# Patient Record
Sex: Female | Born: 1960
Health system: Southern US, Community
[De-identification: ages and names within clinical notes are randomized; demographics above are authoritative.]

## PROBLEM LIST (undated history)

## (undated) ENCOUNTER — Emergency Department (HOSPITAL_COMMUNITY): Payer: BC Managed Care – PPO

## (undated) DIAGNOSIS — E039 Hypothyroidism, unspecified: Secondary | ICD-10-CM

## (undated) DIAGNOSIS — C801 Malignant (primary) neoplasm, unspecified: Secondary | ICD-10-CM

## (undated) DIAGNOSIS — E785 Hyperlipidemia, unspecified: Secondary | ICD-10-CM

## (undated) DIAGNOSIS — I10 Essential (primary) hypertension: Secondary | ICD-10-CM

## (undated) HISTORY — DX: Morbid (severe) obesity due to excess calories: E66.01

## (undated) HISTORY — DX: Hypothyroidism, unspecified: E03.9

## (undated) HISTORY — DX: Essential (primary) hypertension: I10

## (undated) HISTORY — DX: Hyperlipidemia, unspecified: E78.5

## (undated) HISTORY — PX: THYROIDECTOMY: SHX17

---
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
## 1998-12-05 ENCOUNTER — Ambulatory Visit (HOSPITAL_COMMUNITY): Admission: RE | Admit: 1998-12-05 | Discharge: 1998-12-05 | Payer: Self-pay | Admitting: Obstetrics

## 1998-12-05 ENCOUNTER — Other Ambulatory Visit: Admission: RE | Admit: 1998-12-05 | Discharge: 1998-12-05 | Payer: Self-pay | Admitting: Obstetrics

## 1999-02-13 ENCOUNTER — Ambulatory Visit (HOSPITAL_COMMUNITY): Admission: RE | Admit: 1999-02-13 | Discharge: 1999-02-13 | Payer: Self-pay | Admitting: Obstetrics

## 1999-02-13 ENCOUNTER — Encounter: Payer: Self-pay | Admitting: Obstetrics

## 1999-05-04 ENCOUNTER — Inpatient Hospital Stay (HOSPITAL_COMMUNITY): Admission: AD | Admit: 1999-05-04 | Discharge: 1999-05-04 | Payer: Self-pay | Admitting: Obstetrics

## 1999-06-27 ENCOUNTER — Inpatient Hospital Stay (HOSPITAL_COMMUNITY): Admission: AD | Admit: 1999-06-27 | Discharge: 1999-06-27 | Payer: Self-pay | Admitting: Obstetrics

## 1999-06-29 ENCOUNTER — Encounter (HOSPITAL_COMMUNITY): Admission: RE | Admit: 1999-06-29 | Discharge: 1999-07-11 | Payer: Self-pay | Admitting: Obstetrics

## 1999-07-02 ENCOUNTER — Inpatient Hospital Stay (HOSPITAL_COMMUNITY): Admission: AD | Admit: 1999-07-02 | Discharge: 1999-07-02 | Payer: Self-pay | Admitting: Obstetrics

## 1999-07-10 ENCOUNTER — Inpatient Hospital Stay (HOSPITAL_COMMUNITY): Admission: AD | Admit: 1999-07-10 | Discharge: 1999-07-13 | Payer: Self-pay | Admitting: Obstetrics

## 2000-05-14 ENCOUNTER — Other Ambulatory Visit: Admission: RE | Admit: 2000-05-14 | Discharge: 2000-05-14 | Payer: Self-pay | Admitting: Obstetrics

## 2000-07-31 ENCOUNTER — Ambulatory Visit (HOSPITAL_COMMUNITY): Admission: RE | Admit: 2000-07-31 | Discharge: 2000-07-31 | Payer: Self-pay | Admitting: Obstetrics

## 2000-07-31 ENCOUNTER — Encounter: Payer: Self-pay | Admitting: Obstetrics

## 2000-08-07 ENCOUNTER — Encounter: Payer: Self-pay | Admitting: Obstetrics

## 2000-08-07 ENCOUNTER — Ambulatory Visit (HOSPITAL_COMMUNITY): Admission: RE | Admit: 2000-08-07 | Discharge: 2000-08-07 | Payer: Self-pay | Admitting: Obstetrics

## 2000-10-28 ENCOUNTER — Emergency Department (HOSPITAL_COMMUNITY): Admission: EM | Admit: 2000-10-28 | Discharge: 2000-10-28 | Payer: Self-pay | Admitting: *Deleted

## 2000-11-17 ENCOUNTER — Inpatient Hospital Stay (HOSPITAL_COMMUNITY): Admission: AD | Admit: 2000-11-17 | Discharge: 2000-11-17 | Payer: Self-pay

## 2000-12-31 ENCOUNTER — Inpatient Hospital Stay (HOSPITAL_COMMUNITY): Admission: AD | Admit: 2000-12-31 | Discharge: 2000-12-31 | Payer: Self-pay | Admitting: Obstetrics

## 2001-01-13 ENCOUNTER — Encounter (INDEPENDENT_AMBULATORY_CARE_PROVIDER_SITE_OTHER): Payer: Self-pay

## 2001-01-13 ENCOUNTER — Inpatient Hospital Stay (HOSPITAL_COMMUNITY): Admission: AD | Admit: 2001-01-13 | Discharge: 2001-01-15 | Payer: Self-pay | Admitting: Obstetrics

## 2003-01-04 ENCOUNTER — Encounter: Payer: Self-pay | Admitting: *Deleted

## 2003-01-04 ENCOUNTER — Emergency Department (HOSPITAL_COMMUNITY): Admission: EM | Admit: 2003-01-04 | Discharge: 2003-01-04 | Payer: Self-pay | Admitting: Emergency Medicine

## 2005-07-12 ENCOUNTER — Ambulatory Visit (HOSPITAL_COMMUNITY): Admission: RE | Admit: 2005-07-12 | Discharge: 2005-07-12 | Payer: Self-pay | Admitting: Obstetrics

## 2008-02-24 ENCOUNTER — Ambulatory Visit: Payer: Self-pay | Admitting: Internal Medicine

## 2008-02-24 LAB — CONVERTED CEMR LAB
LDL Cholesterol: 150 mg/dL — ABNORMAL HIGH (ref 0–99)
Potassium: 4.2 meq/L (ref 3.5–5.3)
Sodium: 140 meq/L (ref 135–145)
Total CHOL/HDL Ratio: 3.9

## 2008-03-03 ENCOUNTER — Ambulatory Visit (HOSPITAL_COMMUNITY): Admission: RE | Admit: 2008-03-03 | Discharge: 2008-03-03 | Payer: Self-pay | Admitting: Family Medicine

## 2008-03-23 ENCOUNTER — Ambulatory Visit: Payer: Self-pay | Admitting: Internal Medicine

## 2008-03-23 LAB — CONVERTED CEMR LAB
CO2: 21 meq/L (ref 19–32)
Chloride: 105 meq/L (ref 96–112)
Creatinine, Ser: 0.75 mg/dL (ref 0.40–1.20)
Glucose, Bld: 101 mg/dL — ABNORMAL HIGH (ref 70–99)
HDL: 58 mg/dL (ref >39)
LDL Cholesterol: 149 mg/dL — ABNORMAL HIGH (ref 0–99)
Potassium: 4.6 meq/L (ref 3.5–5.3)

## 2008-03-30 ENCOUNTER — Ambulatory Visit (HOSPITAL_COMMUNITY): Admission: RE | Admit: 2008-03-30 | Discharge: 2008-03-30 | Payer: Self-pay | Admitting: *Deleted

## 2009-11-08 ENCOUNTER — Ambulatory Visit: Payer: Self-pay | Admitting: Internal Medicine

## 2009-11-08 LAB — CONVERTED CEMR LAB
BUN: 13 mg/dL (ref 6–23)
CO2: 22 meq/L (ref 19–32)
Cholesterol: 261 mg/dL — ABNORMAL HIGH (ref 0–200)
Glucose, Bld: 98 mg/dL (ref 70–99)
HDL: 62 mg/dL (ref >39)
Potassium: 4.4 meq/L (ref 3.5–5.3)
Sodium: 140 meq/L (ref 135–145)
Total CHOL/HDL Ratio: 4.2
VLDL: 21 mg/dL (ref 0–40)

## 2009-12-11 ENCOUNTER — Ambulatory Visit: Payer: Self-pay | Admitting: Internal Medicine

## 2009-12-11 LAB — CONVERTED CEMR LAB
BUN: 15 mg/dL (ref 6–23)
Calcium: 9.4 mg/dL (ref 8.4–10.5)
Creatinine, Ser: 0.86 mg/dL (ref 0.40–1.20)
Glucose, Bld: 93 mg/dL (ref 70–99)
Hgb A1c MFr Bld: 6.1 % (ref 4.6–6.1)
Potassium: 4.3 meq/L (ref 3.5–5.3)
Sodium: 138 meq/L (ref 135–145)

## 2009-12-18 ENCOUNTER — Ambulatory Visit: Payer: Self-pay | Admitting: Internal Medicine

## 2009-12-18 LAB — CONVERTED CEMR LAB: TSH: 1.406 microintl units/mL (ref 0.350–4.500)

## 2009-12-27 ENCOUNTER — Ambulatory Visit (HOSPITAL_COMMUNITY): Admission: RE | Admit: 2009-12-27 | Discharge: 2009-12-27 | Payer: Self-pay | Admitting: Internal Medicine

## 2010-01-18 ENCOUNTER — Encounter (HOSPITAL_COMMUNITY): Admission: RE | Admit: 2010-01-18 | Discharge: 2010-04-04 | Payer: Self-pay | Admitting: Internal Medicine

## 2010-02-07 ENCOUNTER — Ambulatory Visit: Payer: Self-pay | Admitting: Internal Medicine

## 2010-03-13 ENCOUNTER — Ambulatory Visit (HOSPITAL_COMMUNITY): Admission: RE | Admit: 2010-03-13 | Discharge: 2010-03-13 | Payer: Self-pay | Admitting: General Surgery

## 2010-05-04 ENCOUNTER — Encounter (INDEPENDENT_AMBULATORY_CARE_PROVIDER_SITE_OTHER): Payer: Self-pay | Admitting: General Surgery

## 2010-05-04 ENCOUNTER — Ambulatory Visit (HOSPITAL_COMMUNITY): Admission: RE | Admit: 2010-05-04 | Discharge: 2010-05-05 | Payer: Self-pay | Admitting: General Surgery

## 2010-05-09 ENCOUNTER — Ambulatory Visit: Payer: Self-pay | Admitting: Internal Medicine

## 2010-05-09 ENCOUNTER — Ambulatory Visit (HOSPITAL_COMMUNITY): Admission: RE | Admit: 2010-05-09 | Discharge: 2010-05-09 | Payer: Self-pay | Admitting: Internal Medicine

## 2010-05-09 ENCOUNTER — Encounter (INDEPENDENT_AMBULATORY_CARE_PROVIDER_SITE_OTHER): Payer: Self-pay | Admitting: Internal Medicine

## 2010-05-09 ENCOUNTER — Ambulatory Visit: Payer: Self-pay | Admitting: Vascular Surgery

## 2010-05-09 LAB — CONVERTED CEMR LAB
Basophils Absolute: 0 10*3/uL (ref 0.0–0.1)
Basophils Relative: 1 % (ref 0–1)
Eosinophils Relative: 3 % (ref 0–5)
HCT: 38.2 % (ref 36.0–46.0)
MCV: 78.8 fL (ref 78.0–100.0)
Monocytes Relative: 10 % (ref 3–12)
Neutro Abs: 2.5 10*3/uL (ref 1.7–7.7)
Neutrophils Relative %: 49 % (ref 43–77)
Prothrombin Time: 15.2 s (ref 11.6–15.2)
RBC: 4.85 M/uL (ref 3.87–5.11)
RDW: 14.7 % (ref 11.5–15.5)
WBC: 5.1 10*3/uL (ref 4.0–10.5)

## 2010-06-04 ENCOUNTER — Ambulatory Visit: Payer: Self-pay | Admitting: Internal Medicine

## 2010-06-28 ENCOUNTER — Ambulatory Visit: Payer: Self-pay | Admitting: Internal Medicine

## 2010-06-28 LAB — CONVERTED CEMR LAB
BUN: 14 mg/dL (ref 6–23)
Glucose, Bld: 106 mg/dL — ABNORMAL HIGH (ref 70–99)
Sodium: 140 meq/L (ref 135–145)

## 2010-08-01 ENCOUNTER — Encounter (INDEPENDENT_AMBULATORY_CARE_PROVIDER_SITE_OTHER): Payer: Self-pay | Admitting: Internal Medicine

## 2010-08-01 LAB — CONVERTED CEMR LAB
ALT: 21 units/L (ref 0–35)
AST: 16 units/L (ref 0–37)
Alkaline Phosphatase: 102 units/L (ref 39–117)
Basophils Relative: 1 % (ref 0–1)
Calcium: 9.5 mg/dL (ref 8.4–10.5)
Chloride: 104 meq/L (ref 96–112)
Eosinophils Relative: 2 % (ref 0–5)
Glucose, Bld: 124 mg/dL — ABNORMAL HIGH (ref 70–99)
Hemoglobin: 11.3 g/dL — ABNORMAL LOW (ref 12.0–15.0)
Lymphocytes Relative: 43 % (ref 12–46)
MCV: 82.6 fL (ref 78.0–100.0)
Monocytes Relative: 6 % (ref 3–12)
Neutro Abs: 2.1 10*3/uL (ref 1.7–7.7)
Platelets: 223 10*3/uL (ref 150–400)
Potassium: 4.2 meq/L (ref 3.5–5.3)
RDW: 15.8 % — ABNORMAL HIGH (ref 11.5–15.5)
TSH: 4.892 microintl units/mL — ABNORMAL HIGH (ref 0.350–4.500)
Total Bilirubin: 0.3 mg/dL (ref 0.3–1.2)
WBC: 4.4 10*3/uL (ref 4.0–10.5)

## 2010-08-06 ENCOUNTER — Ambulatory Visit (HOSPITAL_COMMUNITY): Admission: RE | Admit: 2010-08-06 | Discharge: 2010-08-06 | Payer: Self-pay | Admitting: Internal Medicine

## 2010-08-15 ENCOUNTER — Encounter (INDEPENDENT_AMBULATORY_CARE_PROVIDER_SITE_OTHER): Payer: Self-pay | Admitting: *Deleted

## 2010-08-15 LAB — CONVERTED CEMR LAB
Cholesterol: 251 mg/dL — ABNORMAL HIGH (ref 0–200)
LDL Cholesterol: 155 mg/dL — ABNORMAL HIGH (ref 0–99)
TSH: 7.362 microintl units/mL — ABNORMAL HIGH (ref 0.350–4.500)
Total CHOL/HDL Ratio: 4
Triglycerides: 170 mg/dL — ABNORMAL HIGH (ref <150)
VLDL: 34 mg/dL (ref 0–40)

## 2010-09-20 ENCOUNTER — Other Ambulatory Visit
Admission: RE | Admit: 2010-09-20 | Discharge: 2010-09-20 | Payer: Self-pay | Source: Home / Self Care | Admitting: Obstetrics & Gynecology

## 2010-09-20 ENCOUNTER — Ambulatory Visit: Payer: Self-pay | Admitting: Obstetrics & Gynecology

## 2010-10-10 ENCOUNTER — Ambulatory Visit: Payer: Self-pay | Admitting: Obstetrics and Gynecology

## 2010-10-15 ENCOUNTER — Encounter (INDEPENDENT_AMBULATORY_CARE_PROVIDER_SITE_OTHER): Payer: Self-pay | Admitting: Internal Medicine

## 2010-10-15 LAB — CONVERTED CEMR LAB
HDL: 56 mg/dL (ref >39)
Total CHOL/HDL Ratio: 4.2
Triglycerides: 106 mg/dL (ref <150)
VLDL: 21 mg/dL (ref 0–40)

## 2011-01-20 LAB — COMPREHENSIVE METABOLIC PANEL
ALT: 27 U/L (ref 0–35)
CO2: 27 mEq/L (ref 19–32)
Calcium: 9.3 mg/dL (ref 8.4–10.5)
Chloride: 104 mEq/L (ref 96–112)
Creatinine, Ser: 0.96 mg/dL (ref 0.4–1.2)
GFR calc Af Amer: >60 mL/min (ref >60)
Potassium: 4 mEq/L (ref 3.5–5.1)
Total Protein: 7.5 g/dL (ref 6.0–8.3)

## 2011-01-20 LAB — URINALYSIS, ROUTINE W REFLEX MICROSCOPIC
Bilirubin Urine: NEGATIVE
Hgb urine dipstick: NEGATIVE
Ketones, ur: NEGATIVE mg/dL
Nitrite: NEGATIVE
Protein, ur: NEGATIVE mg/dL
Specific Gravity, Urine: 1.024 (ref 1.005–1.030)
Urobilinogen, UA: 0.2 mg/dL (ref 0.0–1.0)
pH: 6.5 (ref 5.0–8.0)

## 2011-01-20 LAB — SURGICAL PCR SCREEN: Staphylococcus aureus: NEGATIVE

## 2011-01-20 LAB — DIFFERENTIAL
Basophils Absolute: 0 10*3/uL (ref 0.0–0.1)
Lymphocytes Relative: 38 % (ref 12–46)
Lymphs Abs: 1.6 10*3/uL (ref 0.7–4.0)
Monocytes Relative: 12 % (ref 3–12)
Neutro Abs: 2.1 10*3/uL (ref 1.7–7.7)

## 2011-01-20 LAB — CBC
HCT: 35.6 % — ABNORMAL LOW (ref 36.0–46.0)
Hemoglobin: 11.9 g/dL — ABNORMAL LOW (ref 12.0–15.0)
MCH: 26.3 pg (ref 26.0–34.0)
MCHC: 33.3 g/dL (ref 30.0–36.0)
RBC: 4.51 MIL/uL (ref 3.87–5.11)

## 2011-01-20 LAB — CALCIUM: Calcium: 8.6 mg/dL (ref 8.4–10.5)

## 2011-01-20 LAB — T3 UPTAKE: T3 Uptake Ratio: 35.4 % (ref 22.5–37.0)

## 2011-03-22 NOTE — Op Note (Signed)
[facility]  Patient:    Michelle [name], Michelle [name]                           MRN: [id number] Proc. Date: 01/14/01 Attending:  Kathreen Cosier, M.D.                           Operative Report  PREOPERATIVE DIAGNOSIS:       Multiparity.  PROCEDURE:                    Postpartum tubal ligation.  SURGEON:                      Kathreen Cosier, M.D.  DESCRIPTION OF PROCEDURE:     Using epidural, the patient was placed in the supine position, the abdomen was prepped and draped and the bladder emptied with a straight catheter.  A midline subumbilical incision 1 inch long was made and carried down to the rectus fascia.  The fascia was cleaned, and grasped with two Kochers.  The fascia and the peritoneum were opened with Mayo scissors.  The left tube was grasped in the mid portion with a Babcock clamp and 0 plain suture was placed on the mesosalpinx below the portion of the tube within the clamp.  This was tied and approximately 1 inch of tube transected. The procedure was done in a similar fashion on the other side.  Hemostasis was satisfactory.  The abdomen was closed in layers, the peritoneum with continuous ______ and the fascia with continuous 2-0 Dexon.  The skin was closed with a subcuticular stitch of 3-0 plain. DD:  01/14/01 TD:  01/14/01 Job: [id number]

## 2011-03-22 NOTE — Discharge Summary (Signed)
[facility]  Patient:    Michelle [name], Michelle [name]                         MRN: [id number] Adm. Date:  [id number] Attending:  Venita Sheffield                           Discharge Summary  HISTORY OF PRESENT ILLNESS:   The patient is a 50 year old gravida 3, para 2-0-0-2, with EDC of January 10, 2001.  HOSPITAL COURSE:              She was admitted in labor and had a normal spontaneous vaginal delivery of a female, Apgars 9 and 9.  The patient desired sterilization and on March 13, underwent postpartum tubal ligation. Postoperatively she did well.  She was discharged home on March 14, second postpartum day, ambulatory on a regular diet, on Tylenol No.3 one q.4h. p.r.n. to see me in six weeks.  Hemoglobin was 11.1.  DISCHARGE DIAGNOSIS:          Status post normal spontaneous vaginal delivery at term and postpartum tubal ligation. DD:  01/15/01 TD:  01/15/01 Job: [id number]

## 2011-11-14 IMAGING — CR DG CHEST 2V
2 series · 2 of 2 positions shown · non-contrast
Comparison: None.

CLINICAL DATA: Preoperative evaluation for thyroid goiter. Past
smoker. No current complaints.

CHEST - 2 VIEW

[w chest pa]
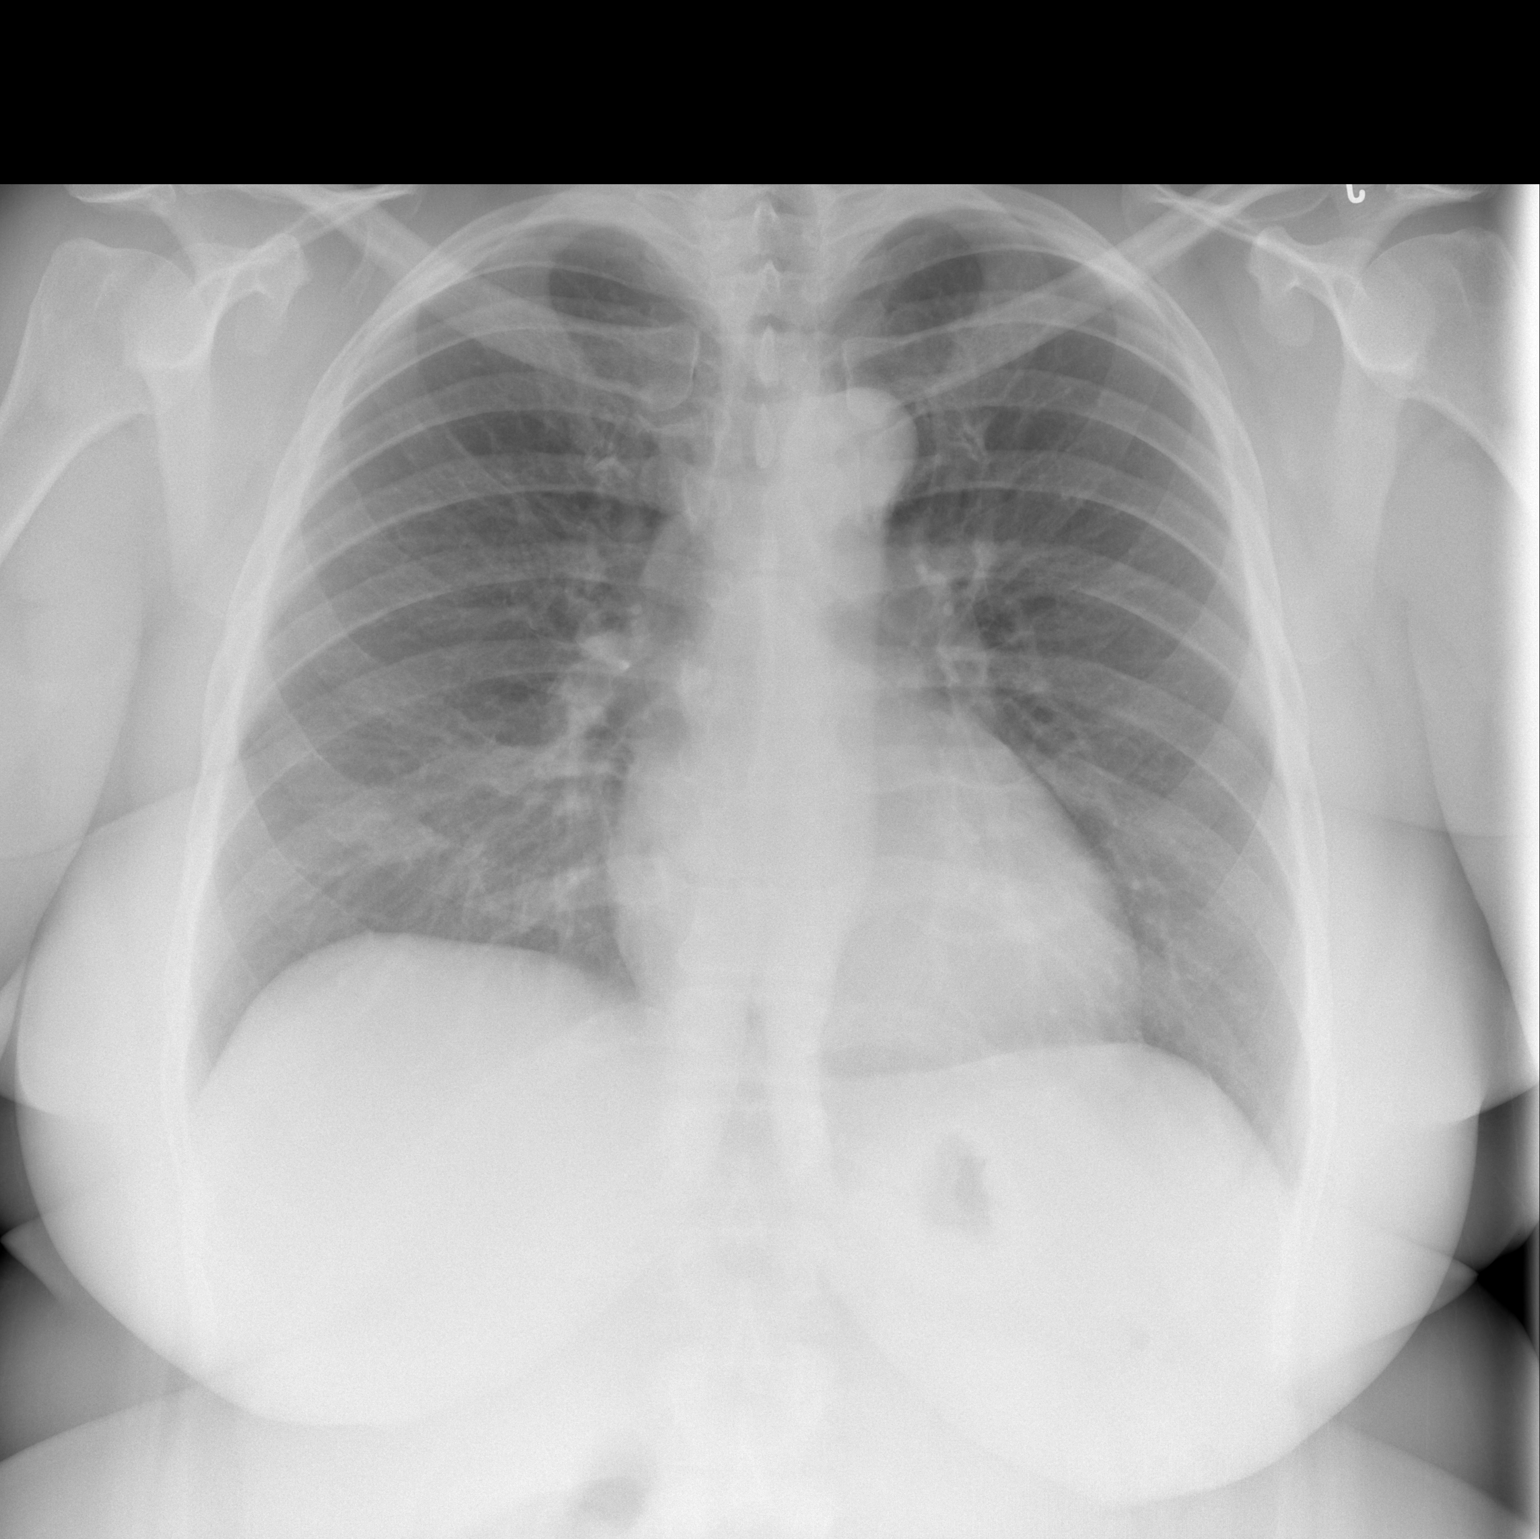

[w chest lat]
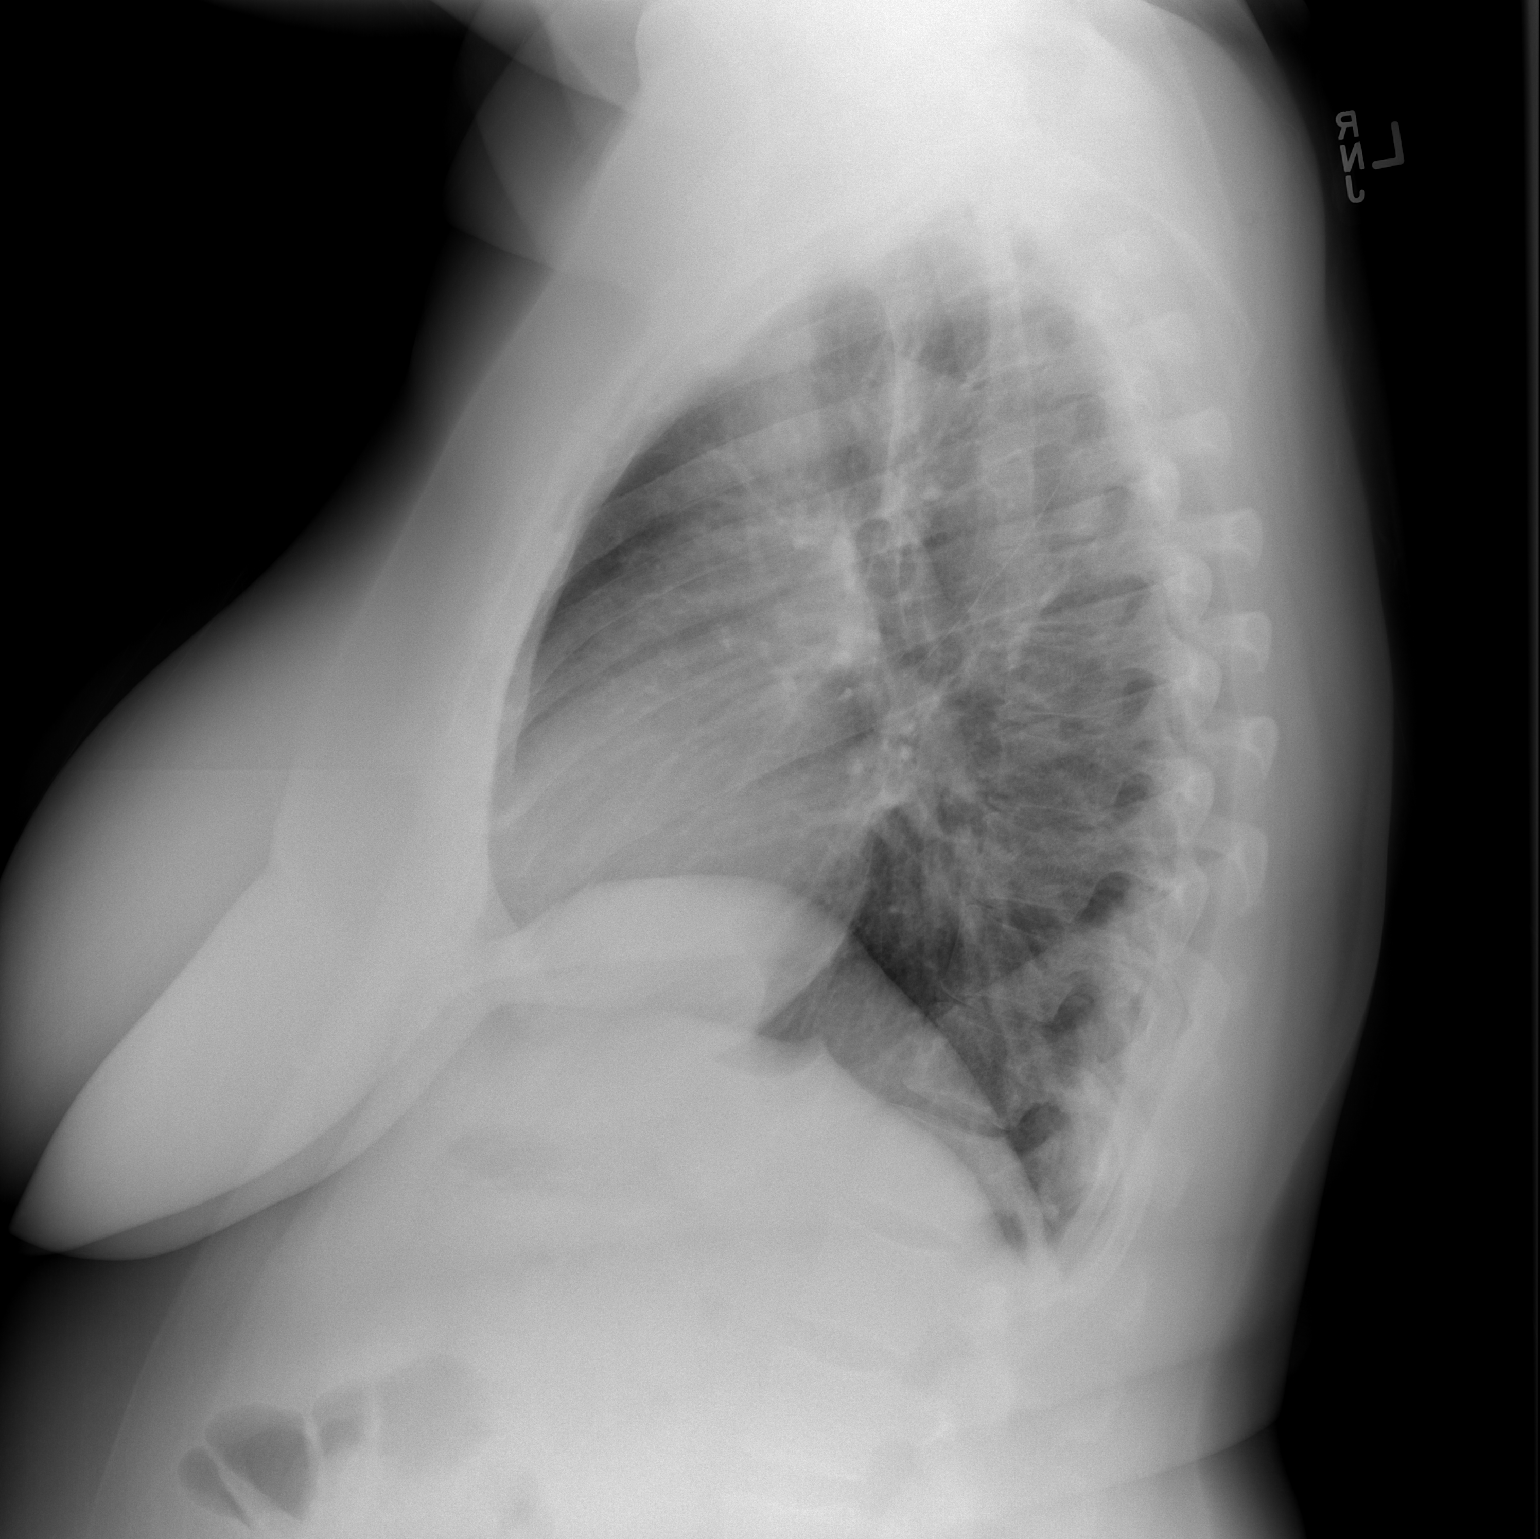

[2 of 2 positions shown; findings below may reference images not displayed]

FINDINGS: Heart and mediastinal contours are within normal limits.
The lung fields are clear with no signs of focal infiltrate or
congestion failure.  No pleural fluid or peribronchial cuffing is
seen.

Bony structures are intact.
IMPRESSION: No acute or focal abnormality noted.

## 2012-02-19 ENCOUNTER — Other Ambulatory Visit: Payer: Self-pay | Admitting: Obstetrics and Gynecology

## 2012-02-19 DIAGNOSIS — Z1231 Encounter for screening mammogram for malignant neoplasm of breast: Secondary | ICD-10-CM

## 2012-02-25 ENCOUNTER — Ambulatory Visit (HOSPITAL_COMMUNITY): Payer: Self-pay

## 2012-02-25 ENCOUNTER — Ambulatory Visit: Payer: Self-pay

## 2012-02-25 ENCOUNTER — Encounter (HOSPITAL_COMMUNITY): Payer: Self-pay

## 2012-03-31 ENCOUNTER — Encounter: Payer: Self-pay | Admitting: *Deleted

## 2012-03-31 ENCOUNTER — Ambulatory Visit (INDEPENDENT_AMBULATORY_CARE_PROVIDER_SITE_OTHER): Payer: Self-pay | Admitting: *Deleted

## 2012-03-31 ENCOUNTER — Ambulatory Visit (HOSPITAL_COMMUNITY)
Admission: RE | Admit: 2012-03-31 | Discharge: 2012-03-31 | Disposition: A | Discharge status: Home / Self Care | Payer: Self-pay | Source: Ambulatory Visit | Attending: Obstetrics and Gynecology | Admitting: Obstetrics and Gynecology

## 2012-03-31 VITALS — BP 136/99 | HR 89 | Temp 99.2°F | Ht 66.0 in | Wt 205.6 lb

## 2012-03-31 DIAGNOSIS — Z01419 Encounter for gynecological examination (general) (routine) without abnormal findings: Secondary | ICD-10-CM

## 2012-03-31 DIAGNOSIS — Z1231 Encounter for screening mammogram for malignant neoplasm of breast: Secondary | ICD-10-CM

## 2012-03-31 NOTE — Progress Notes (Signed)
Patient complained that she needed a repeat Pap smear due to insufficient cells. Last Pap smear performed 12/30/11 at the free cervical cancer screening at the [facility].  Pap Smear:    Pap smear completed today. Patients last Pap smear was 12/30/11 at the free cervical cancer screening at the [facility] and [facility] with HPV typing insufficient due to not enough residual specimen to perform test. Did not receive patients Pap smear result until after patient left. Per patient has no history of abnormal Pap smears prior. No Pap smear results in EPIC.  Physical exam: Breasts Breasts symmetrical. No skin abnormalities bilateral breasts. No nipple retraction bilateral breasts. No nipple discharge bilateral breasts. No lymphadenopathy. No lumps palpated bilateral breasts. No complaints of pain or tenderness on exam.          Pelvic/Bimanual   Ext Genitalia No lesions, no swelling and no discharge observed on external genitalia.         Vagina Vagina pink and normal texture. No lesions or discharge observed in vagina.          Cervix Cervix is present. Cervix red and of normal texture. Cervix friable. No discharge observed at cervical os.     Uterus Uterus is present and palpable. Uterus in normal position and normal size.        Adnexae Bilateral ovaries present and palpable. No tenderness on palpation.          Rectovaginal No rectal exam completed today since patient had no rectal complaints. No skin abnormalities observed on exam.

## 2012-03-31 NOTE — Patient Instructions (Signed)
Taught patient how to perform BSE.  Let her know [facility] will cover Pap smears every 3 years unless has a history of abnormal Pap smears and depending on today's result would determine when next Pap smear is due. Patient is escorted to mammography for a screening mammogram.  Let patient know will follow up with her within the next couple weeks with results. Patient verbalized understanding.

## 2012-04-14 ENCOUNTER — Encounter: Payer: Self-pay | Admitting: *Deleted

## 2012-04-14 ENCOUNTER — Telehealth: Payer: Self-pay | Admitting: *Deleted

## 2012-04-14 NOTE — Telephone Encounter (Signed)
Letter mailed to patient in reference to normal pap smear and normal mammogram.

## 2012-06-01 ENCOUNTER — Encounter: Payer: Self-pay | Admitting: *Deleted

## 2012-06-01 NOTE — Progress Notes (Signed)
[facility] Clinical Social Work  Clinical Social Work met with patient and patient's spouse to complete healthcare living will and healthcare power of attorney.  Michelle [name] designated [name] as her healthcare agent.  CSW will send documents to medical records to be scanned into her chart. To access documents, go to Chart Review > Media Tab > Advance Directives/Clinical Social Work.  [name], MSW, [facility] Clinical Social Worker [facility] [phone]

## 2012-12-10 ENCOUNTER — Other Ambulatory Visit: Payer: Self-pay | Admitting: Pediatrics

## 2012-12-10 ENCOUNTER — Ambulatory Visit: Payer: Self-pay

## 2012-12-10 DIAGNOSIS — M25569 Pain in unspecified knee: Secondary | ICD-10-CM

## 2013-03-22 ENCOUNTER — Other Ambulatory Visit (HOSPITAL_COMMUNITY): Payer: Self-pay | Admitting: Family Medicine

## 2013-03-22 DIAGNOSIS — Z1231 Encounter for screening mammogram for malignant neoplasm of breast: Secondary | ICD-10-CM

## 2013-04-01 ENCOUNTER — Ambulatory Visit (HOSPITAL_COMMUNITY)
Admission: RE | Admit: 2013-04-01 | Discharge: 2013-04-01 | Disposition: A | Discharge status: Home / Self Care | Payer: No Typology Code available for payment source | Source: Ambulatory Visit | Attending: Family Medicine | Admitting: Family Medicine

## 2013-04-01 DIAGNOSIS — Z1231 Encounter for screening mammogram for malignant neoplasm of breast: Secondary | ICD-10-CM | POA: Insufficient documentation

## 2014-01-12 ENCOUNTER — Other Ambulatory Visit (HOSPITAL_COMMUNITY): Payer: Self-pay | Admitting: Obstetrics

## 2014-01-12 DIAGNOSIS — Z780 Asymptomatic menopausal state: Secondary | ICD-10-CM

## 2014-01-17 ENCOUNTER — Ambulatory Visit (HOSPITAL_COMMUNITY)
Admission: RE | Admit: 2014-01-17 | Discharge: 2014-01-17 | Disposition: A | Discharge status: Home / Self Care | Payer: No Typology Code available for payment source | Source: Ambulatory Visit | Attending: Obstetrics | Admitting: Obstetrics

## 2014-01-17 DIAGNOSIS — Z13820 Encounter for screening for osteoporosis: Secondary | ICD-10-CM | POA: Insufficient documentation

## 2014-01-17 DIAGNOSIS — Z780 Asymptomatic menopausal state: Secondary | ICD-10-CM | POA: Insufficient documentation

## 2014-02-21 ENCOUNTER — Other Ambulatory Visit (HOSPITAL_COMMUNITY): Payer: Self-pay | Admitting: Obstetrics

## 2014-02-21 DIAGNOSIS — Z1231 Encounter for screening mammogram for malignant neoplasm of breast: Secondary | ICD-10-CM

## 2014-04-05 ENCOUNTER — Ambulatory Visit (HOSPITAL_COMMUNITY)
Admission: RE | Admit: 2014-04-05 | Discharge: 2014-04-05 | Disposition: A | Discharge status: Home / Self Care | Payer: No Typology Code available for payment source | Source: Ambulatory Visit | Attending: Obstetrics | Admitting: Obstetrics

## 2014-04-05 DIAGNOSIS — Z1231 Encounter for screening mammogram for malignant neoplasm of breast: Secondary | ICD-10-CM | POA: Insufficient documentation

## 2014-09-05 ENCOUNTER — Encounter: Payer: Self-pay | Admitting: *Deleted

## 2015-03-22 ENCOUNTER — Other Ambulatory Visit (HOSPITAL_COMMUNITY): Payer: Self-pay | Admitting: Obstetrics

## 2015-03-22 DIAGNOSIS — Z1231 Encounter for screening mammogram for malignant neoplasm of breast: Secondary | ICD-10-CM

## 2015-04-07 ENCOUNTER — Ambulatory Visit (HOSPITAL_COMMUNITY)
Admission: RE | Admit: 2015-04-07 | Discharge: 2015-04-07 | Disposition: A | Discharge status: Home / Self Care | Payer: 59 | Source: Ambulatory Visit | Attending: Obstetrics | Admitting: Obstetrics

## 2015-04-07 DIAGNOSIS — Z1231 Encounter for screening mammogram for malignant neoplasm of breast: Secondary | ICD-10-CM | POA: Insufficient documentation

## 2015-07-12 ENCOUNTER — Emergency Department (HOSPITAL_COMMUNITY): Payer: 59

## 2015-07-12 ENCOUNTER — Encounter (HOSPITAL_COMMUNITY): Payer: Self-pay | Admitting: *Deleted

## 2015-07-12 ENCOUNTER — Emergency Department (HOSPITAL_COMMUNITY)
Admission: EM | Admit: 2015-07-12 | Discharge: 2015-07-13 | Disposition: A | Discharge status: Home / Self Care | Payer: 59 | Attending: Emergency Medicine | Admitting: Emergency Medicine

## 2015-07-12 DIAGNOSIS — Z8639 Personal history of other endocrine, nutritional and metabolic disease: Secondary | ICD-10-CM | POA: Insufficient documentation

## 2015-07-12 DIAGNOSIS — Z79899 Other long term (current) drug therapy: Secondary | ICD-10-CM | POA: Insufficient documentation

## 2015-07-12 DIAGNOSIS — R079 Chest pain, unspecified: Secondary | ICD-10-CM | POA: Diagnosis not present

## 2015-07-12 DIAGNOSIS — Z880 Allergy status to penicillin: Secondary | ICD-10-CM | POA: Insufficient documentation

## 2015-07-12 DIAGNOSIS — I10 Essential (primary) hypertension: Secondary | ICD-10-CM | POA: Diagnosis not present

## 2015-07-12 LAB — CBC
HEMATOCRIT: 37.3 % (ref 36.0–46.0)
Hemoglobin: 12.2 g/dL (ref 12.0–15.0)
MCH: 26.3 pg (ref 26.0–34.0)
MCHC: 32.7 g/dL (ref 30.0–36.0)
MCV: 80.6 fL (ref 78.0–100.0)
PLATELETS: 260 10*3/uL (ref 150–400)
RBC: 4.63 MIL/uL (ref 3.87–5.11)
RDW: 14.2 % (ref 11.5–15.5)
WBC: 6.5 10*3/uL (ref 4.0–10.5)

## 2015-07-12 LAB — BASIC METABOLIC PANEL
Anion gap: 7 (ref 5–15)
BUN: 19 mg/dL (ref 6–20)
CALCIUM: 9.6 mg/dL (ref 8.9–10.3)
CO2: 26 mmol/L (ref 22–32)
CREATININE: 1.32 mg/dL — AB (ref 0.44–1.00)
Chloride: 106 mmol/L (ref 101–111)
GFR calc Af Amer: 51 mL/min — ABNORMAL LOW (ref >60)
GFR, EST NON AFRICAN AMERICAN: 44 mL/min — AB (ref >60)
GLUCOSE: 101 mg/dL — AB (ref 65–99)
Potassium: 4 mmol/L (ref 3.5–5.1)
SODIUM: 139 mmol/L (ref 135–145)

## 2015-07-12 LAB — I-STAT TROPONIN, ED: Troponin i, poc: 0 ng/mL (ref 0.00–0.08)

## 2015-07-12 MED ORDER — GI COCKTAIL ~~LOC~~
30.0000 mL | Freq: Once | ORAL | Status: AC
  Administered 2015-07-12: 30 mL via ORAL
  Filled 2015-07-12: qty 30

## 2015-07-12 MED ORDER — SODIUM CHLORIDE 0.9 % IV BOLUS (SEPSIS)
1000.0000 mL | Freq: Once | INTRAVENOUS | Status: AC
  Administered 2015-07-12: 1000 mL via INTRAVENOUS

## 2015-07-12 NOTE — ED Notes (Signed)
Pt reports pain in her L collar bone radiating to her L axilla intermittently x 1 week.  Pt reports feeling lightheaded at one point.   Reports nothing makes pain worse or better.  States a [employer] scientist at church advised her to come to the ED to be evaluated.

## 2015-07-13 LAB — I-STAT TROPONIN, ED: TROPONIN I, POC: 0 ng/mL (ref 0.00–0.08)

## 2015-07-13 MED ORDER — OMEPRAZOLE 20 MG PO CPDR: 20.0000 mg | DELAYED_RELEASE_CAPSULE | Freq: Every day | ORAL | Status: DC

## 2015-07-13 NOTE — Discharge Instructions (Signed)
Please follow up with your primary care physician in the next 1-2 weeks.  You will need repeat BMP, evaluation of your blood pressure, and follow up on your chest pain to see if prilosec/omeprazole was an effective treatment.   Chest Pain (Nonspecific) It is often hard to give a specific diagnosis for the cause of chest pain. There is always a chance that your pain could be related to something serious, such as a heart attack or a blood clot in the lungs. You need to follow up with your health care provider for further evaluation. CAUSES   Heartburn.  Pneumonia or bronchitis.  Anxiety or stress.  Inflammation around your heart (pericarditis) or lung (pleuritis or pleurisy).  A blood clot in the lung.  A collapsed lung (pneumothorax). It can develop suddenly on its own (spontaneous pneumothorax) or from trauma to the chest.  Shingles infection (herpes zoster virus). The chest wall is composed of bones, muscles, and cartilage. Any of these can be the source of the pain.  The bones can be bruised by injury.  The muscles or cartilage can be strained by coughing or overwork.  The cartilage can be affected by inflammation and become sore (costochondritis). DIAGNOSIS  Lab tests or other studies may be needed to find the cause of your pain. Your health care provider may have you take a test called an ambulatory electrocardiogram (ECG). An ECG records your heartbeat patterns over a 24-hour period. You may also have other tests, such as:  Transthoracic echocardiogram (TTE). During echocardiography, sound waves are used to evaluate how blood flows through your heart.  Transesophageal echocardiogram (TEE).  Cardiac monitoring. This allows your health care provider to monitor your heart rate and rhythm in real time.  Holter monitor. This is a portable device that records your heartbeat and can help diagnose heart arrhythmias. It allows your health care provider to track your heart activity for  several days, if needed.  Stress tests by exercise or by giving medicine that makes the heart beat faster. TREATMENT   Treatment depends on what may be causing your chest pain. Treatment may include:  Acid blockers for heartburn.  Anti-inflammatory medicine.  Pain medicine for inflammatory conditions.  Antibiotics if an infection is present.  You may be advised to change lifestyle habits. This includes stopping smoking and avoiding alcohol, caffeine, and chocolate.  You may be advised to keep your head raised (elevated) when sleeping. This reduces the chance of acid going backward from your stomach into your esophagus. Most of the time, nonspecific chest pain will improve within 2-3 days with rest and mild pain medicine.  HOME CARE INSTRUCTIONS   If antibiotics were prescribed, take them as directed. Finish them even if you start to feel better.  For the next few days, avoid physical activities that bring on chest pain. Continue physical activities as directed.  Do not use any tobacco products, including cigarettes, chewing tobacco, or electronic cigarettes.  Avoid drinking alcohol.  Only take medicine as directed by your health care provider.  Follow your health care provider's suggestions for further testing if your chest pain does not go away.  Keep any follow-up appointments you made. If you do not go to an appointment, you could develop lasting (chronic) problems with pain. If there is any problem keeping an appointment, call to reschedule. SEEK MEDICAL CARE IF:   Your chest pain does not go away, even after treatment.  You have a rash with blisters on your chest.  You have  a fever. SEEK IMMEDIATE MEDICAL CARE IF:   You have increased chest pain or pain that spreads to your arm, neck, jaw, back, or abdomen.  You have shortness of breath.  You have an increasing cough, or you cough up blood.  You have severe back or abdominal pain.  You feel nauseous or  vomit.  You have severe weakness.  You faint.  You have chills. This is an emergency. Do not wait to see if the pain will go away. Get medical help at once. Call your local emergency services (911 in [place] Do not drive yourself to the hospital. MAKE SURE YOU:   Understand these instructions.  Will watch your condition.  Will get help right away if you are not doing well or get worse. Document Released: 07/31/2005 Document Revised: 10/26/2013 Document Reviewed: 05/26/2008 [facility] Patient Information 2015 [place], [place]. This information is not intended to replace advice given to you by your health care provider. Make sure you discuss any questions you have with your health care provider.

## 2015-07-13 NOTE — ED Provider Notes (Signed)
CSN: [id number]     Arrival date & time 07/12/15  1851 History   First MD Initiated Contact with Patient 07/12/15 2215     Chief Complaint  Patient presents with  . Chest Pain     (Consider location/radiation/quality/duration/timing/severity/associated sxs/prior Treatment) HPI   Patient is a 54 year old female with past medical history of hypertension hyperlipidemia, she states she is currently not treated for either, she is otherwise healthy, she presents to the ER with one week of gradually increasing and intermittent central and left sided chest pain with some radiation to her left shoulder blade, rated 7/10, and described as an achy feeling.  She does not identify any aggravating or alleviating factors, it does not worsen with exertion, with eating, with positional changes, or with inspiration.  Over the past 3 days has become more intense, and a nurse at her church took her blood pressure and then insisted she come to the ER for evaluation.  She denies any shortness of breath, orthopnea, PND, OSA, palpitations, lower extremity edema, nausea, vomiting, cough, wheeze, diaphoresis, dyspepsia, abdominal pain, syncopy.  She endorses some intermittent lightheadedness, reflux, and she does not like to drink water. She does not have a family hx of MI or stroke.  She does not smoke or have DM.  Past Medical History  Diagnosis Date  . Hypertension   . Hyperlipemia    Past Surgical History  Procedure Laterality Date  . Thyroidectomy     Family History  Problem Relation Age of Onset  . Diabetes Paternal Grandfather   . Diabetes Father   . Hypertension Father   . Heart disease Father   . Hypertension Mother    Social History  Substance Use Topics  . Smoking status: Never Smoker   . Smokeless tobacco: Never Used  . Alcohol Use: No   OB History    Gravida Para Term Preterm AB TAB SAB Ectopic Multiple Living   4 3 3  1 1    3      Review of Systems  Constitutional: Negative for fever,  chills, diaphoresis and fatigue.  HENT: Negative.   Eyes: Negative.   Respiratory: Negative for cough, chest tightness, shortness of breath, wheezing and stridor.   Cardiovascular: Negative for palpitations and leg swelling.  Gastrointestinal: Negative for nausea, vomiting, abdominal pain, diarrhea and abdominal distention.  Endocrine: Negative.   Genitourinary: Negative.   Musculoskeletal: Negative.   Skin: Negative.   Neurological: Negative for dizziness, syncope, weakness and headaches.  Psychiatric/Behavioral: Negative.       Allergies  Penicillins  Home Medications   Prior to Admission medications   Medication Sig Start Date End Date Taking? Authorizing Provider  GARLIC PO Take 1 capsule by mouth daily.   Yes Historical Provider, MD  levothyroxine (SYNTHROID, LEVOTHROID) 100 MCG tablet Take 100 mcg by mouth daily.   Yes Historical Provider, MD  Omega-3 Fatty Acids (FISH OIL PO) Take 1 capsule by mouth daily.   Yes Historical Provider, MD  omeprazole (PRILOSEC) 20 MG capsule Take 1 capsule (20 mg total) by mouth daily. 07/13/15   Delsa Grana, PA-C   BP 143/80 mmHg  Pulse 70  Temp(Src) 98.1 F (36.7 C) (Oral)  Resp 16  SpO2 99% Physical Exam  Constitutional: She is oriented to person, place, and time. She appears well-developed and well-nourished. No distress.  HENT:  Head: Normocephalic and atraumatic.  Nose: Nose normal.  Mouth/Throat: Oropharynx is clear and moist. No oropharyngeal exudate.  Eyes: Conjunctivae and EOM are normal. Pupils  are equal, round, and reactive to light. Right eye exhibits no discharge. Left eye exhibits no discharge. No scleral icterus.  Neck: Normal range of motion. No JVD present. No tracheal deviation present. No thyromegaly present.  Cardiovascular: Normal rate, regular rhythm, normal heart sounds and intact distal pulses.  Exam reveals no gallop and no friction rub.   No murmur heard. Pulmonary/Chest: Effort normal and breath sounds normal.  No respiratory distress. She has no wheezes. She has no rales. She exhibits no tenderness.  Abdominal: Soft. Bowel sounds are normal. She exhibits no distension and no mass. There is no tenderness. There is no rebound and no guarding.  Musculoskeletal: Normal range of motion. She exhibits no edema or tenderness.  Lymphadenopathy:    She has no cervical adenopathy.  Neurological: She is alert and oriented to person, place, and time. She has normal reflexes. No cranial nerve deficit. She exhibits normal muscle tone. Coordination normal.  Skin: Skin is warm and dry. No rash noted. She is not diaphoretic. No erythema. No pallor.  Psychiatric: She has a normal mood and affect. Her behavior is normal. Judgment and thought content normal.  Nursing note and vitals reviewed.      ED Course  Procedures (including critical care time) Labs Review Labs Reviewed  BASIC METABOLIC PANEL - Abnormal; Notable for the following:    Glucose, Bld 101 (*)    Creatinine, Ser 1.32 (*)    GFR calc non Af Amer 44 (*)    GFR calc Af Amer 51 (*)    All other components within normal limits  CBC  I-STAT TROPOININ, ED  Randolm Idol, ED    Imaging Review Dg Chest 2 View  07/12/2015   CLINICAL DATA:  Upper chest pain for 1 week.  EXAM: CHEST  2 VIEW  COMPARISON:  Chest radiograph 05/01/2010  FINDINGS: Stable cardiac and mediastinal contours. No consolidative pulmonary opacities. No pleural effusion or pneumothorax. Regional skeleton is unremarkable.  IMPRESSION: No acute cardiopulmonary process.   Electronically Signed   By: Lovey Newcomer M.D.   On: 07/12/2015 19:20   I have personally reviewed and evaluated these images and lab results as part of my medical decision-making.   EKG Interpretation   Date/Time:  Thursday July 13 2015 00:01:25 EDT Ventricular Rate:  83 PR Interval:  169 QRS Duration: 97 QT Interval:  383 QTC Calculation: 450 R Axis:   87 Text Interpretation:  Sinus rhythm No significant  change since last  tracing Confirmed by LIU MD, DANA [phone]) on 07/13/2015 12:10:50 AM      MDM   Final diagnoses:  Chest pain, unspecified chest pain type    Patient is to be discharged with recommendation to follow up with PCP in regards to today's hospital visit. Chest pain is not likely of cardiac or pulmonary etiology d/t presentation, pt has no risk factors for PE (Well's criteria), HEART score 2 (age and obesity), VSS, no tracheal deviation, no JVD or new murmur, RRR, breath sounds equal bilaterally, EKG without acute abnormalities, negative troponin x 2, and negative CXR. Pt has been advised to return to the ED if CP becomes exertional, associated with diaphoresis or nausea, radiates to left jaw/arm, worsens or becomes concerning in any way. Pt appears reliable for follow up and is agreeable to discharge.   Case has been discussed with and seen by Dr. Oleta Mouse who agrees with the above plan to discharge.  Pt will be discharged with a PPI trial for possible gastritis/esophagitis/GERD.  She  had some relief of her reflux symptoms with a GI cocktail.  She was informed about a mildly elevated creatinine, her baseline is unknown, only labs available for comparison are from 5 years ago.  She was given some fluids for her lightheadedness and suspicion that she may be dehydrated because she does not like to drink water.  She will follow-up with her primary care provider in the next 1-2 weeks to repeat her BMP and to follow up on chest pain and effectiveness of PPI.  Medications  sodium chloride 0.9 % bolus 1,000 mL (1,000 mLs Intravenous New Bag/Given 07/12/15 2346)  gi cocktail (Maalox,Lidocaine,Donnatal) (30 mLs Oral Given 07/12/15 2342)   Filed Vitals:   07/12/15 2230 07/12/15 2300 07/12/15 2330 07/13/15 0100  BP: 155/82 145/77 141/84 143/80  Pulse: 77 76 75 70  Temp:      TempSrc:      Resp: 17 17 15 16   SpO2: 100% 99% 100% 99%     Delsa Grana, PA-C 07/13/15 0120  Forde Dandy, MD 07/13/15  0130

## 2016-03-27 ENCOUNTER — Other Ambulatory Visit: Payer: Self-pay

## 2016-03-27 DIAGNOSIS — Z1231 Encounter for screening mammogram for malignant neoplasm of breast: Secondary | ICD-10-CM

## 2016-03-28 ENCOUNTER — Ambulatory Visit
Admission: RE | Admit: 2016-03-28 | Discharge: 2016-03-28 | Disposition: A | Discharge status: Home / Self Care | Payer: BLUE CROSS/BLUE SHIELD | Source: Ambulatory Visit | Attending: Internal Medicine | Admitting: Internal Medicine

## 2016-03-28 ENCOUNTER — Other Ambulatory Visit: Payer: Self-pay | Admitting: Internal Medicine

## 2016-03-28 DIAGNOSIS — M25571 Pain in right ankle and joints of right foot: Secondary | ICD-10-CM

## 2016-04-10 ENCOUNTER — Ambulatory Visit: Payer: No Typology Code available for payment source

## 2016-04-15 ENCOUNTER — Ambulatory Visit
Admission: RE | Admit: 2016-04-15 | Discharge: 2016-04-15 | Disposition: A | Discharge status: Home / Self Care | Payer: BLUE CROSS/BLUE SHIELD | Source: Ambulatory Visit

## 2016-04-15 DIAGNOSIS — Z1231 Encounter for screening mammogram for malignant neoplasm of breast: Secondary | ICD-10-CM

## 2016-04-23 ENCOUNTER — Ambulatory Visit: Payer: BLUE CROSS/BLUE SHIELD | Admitting: Sports Medicine

## 2016-07-29 ENCOUNTER — Encounter: Payer: Self-pay | Admitting: *Deleted

## 2016-09-30 ENCOUNTER — Ambulatory Visit: Payer: BLUE CROSS/BLUE SHIELD | Admitting: Cardiovascular Disease

## 2017-02-13 ENCOUNTER — Ambulatory Visit (INDEPENDENT_AMBULATORY_CARE_PROVIDER_SITE_OTHER): Payer: BLUE CROSS/BLUE SHIELD | Admitting: Cardiovascular Disease

## 2017-02-13 ENCOUNTER — Encounter: Payer: Self-pay | Admitting: Cardiovascular Disease

## 2017-02-13 VITALS — BP 132/82 | HR 99 | Ht 65.5 in | Wt 246.6 lb

## 2017-02-13 DIAGNOSIS — E039 Hypothyroidism, unspecified: Secondary | ICD-10-CM | POA: Diagnosis not present

## 2017-02-13 DIAGNOSIS — I10 Essential (primary) hypertension: Secondary | ICD-10-CM

## 2017-02-13 DIAGNOSIS — E7800 Pure hypercholesterolemia, unspecified: Secondary | ICD-10-CM | POA: Diagnosis not present

## 2017-02-13 NOTE — Patient Instructions (Signed)
Medication Instructions:  Your physician recommends that you continue on your current medications as directed. Please refer to the Current Medication list given to you today.  Labwork: none  Testing/Procedures: none  Follow-Up: Your physician wants you to follow-up in: 3 month ov You will receive a reminder letter in the mail two months in advance. If you don't receive a letter, please call our office to schedule the follow-up appointment.  If you need a refill on your cardiac medications before your next appointment, please call your pharmacy.

## 2017-02-13 NOTE — Progress Notes (Signed)
Cardiology Office Note   Date:  02/16/2017   ID:  Michelle [name], DOB [birth year], MRN [id number]  PCP:  Kelton Pillar, MD  Cardiologist:   Skeet Latch, MD   No chief complaint on file.     History of Present Illness: Michelle [name] is a 56 y.o. female hypertension and hyperlipidemia who is being seen today for the evaluation of hyperlipidemia at the request of Marijean Bravo, MD.  Michelle [name] was diagnosed with hyperlipidemia over seven years ago.  She tried several statins but didn't tolerate them due to myalgias and memory difficulty.  She is unsure which she tried.  She is now very averse to trying other medications.  She has been taking fish oil, garlic, apple cidar vinegar, oolong tea and tumeric tea.  She is also working on her diet by limiting bacon and eggs.  She is also trying to reduce her intake of sweets.  Michelle [name] does not get much exercise.  She tries to walk around her house 2-3 times per week.  She denies chest pain or shortness of breath.  She also hasn't noted any lower extremity edema, orthopnea or PND.  Her blood pressure is typically in the 120s-low 130s.  Occasionally it has  Been as high as 865 systolic.     Past Medical History:  Diagnosis Date  . Essential hypertension 02/16/2017  . Hyperlipemia   . Hyperlipidemia 02/16/2017  . Hypertension   . Hypothyroidism 02/16/2017  . Morbid obesity ([place]) 02/16/2017    Past Surgical History:  Procedure Laterality Date  . THYROIDECTOMY       Current Outpatient Prescriptions  Medication Sig Dispense Refill  . Capsicum, Cayenne, (CAYENNE PO) Take 60 mg by mouth daily.    . Cholecalciferol (VITAMIN D3) 2000 units TABS Take 1 tablet by mouth daily.    Marland Kitchen levothyroxine (SYNTHROID, LEVOTHROID) 100 MCG tablet Take 100 mcg by mouth daily.     No current facility-administered medications for this visit.     Allergies:   Penicillins    Social History:  The patient  reports that she has quit smoking. She has never used  smokeless tobacco. She reports that she does not drink alcohol or use drugs.   Family History:  The patient's family history includes Diabetes in her father and paternal grandfather; Heart attack in her maternal grandmother and paternal grandfather; Hypertension in her father, mother, and sister; Kidney disease in her mother; Lung cancer in her father; Stroke in her father, paternal grandfather, and paternal grandmother.    ROS:  Please see the history of present illness.   Otherwise, review of systems are positive for none.   All other systems are reviewed and negative.    PHYSICAL EXAM: VS:  BP 132/82   Pulse 99   Ht 5' 5.5" (1.664 m)   Wt 111.9 kg (246 lb 9.6 oz)   BMI 40.41 kg/m  , BMI Body mass index is 40.41 kg/m. GENERAL:  Well appearing HEENT:  Pupils equal round and reactive, fundi not visualized, oral mucosa unremarkable NECK:  No jugular venous distention, waveform within normal limits, carotid upstroke brisk and symmetric, no bruits, no thyromegaly LYMPHATICS:  No cervical adenopathy LUNGS:  Clear to auscultation bilaterally HEART:  RRR.  PMI not displaced or sustained,S1 and S2 within normal limits, no S3, no S4, no clicks, no rubs, no murmurs ABD:  Flat, positive bowel sounds normal in frequency in pitch, no bruits, no rebound, no guarding, no midline pulsatile  mass, no hepatomegaly, no splenomegaly EXT:  2 plus pulses throughout, no edema, no cyanosis no clubbing SKIN:  No rashes no nodules NEURO:  Cranial nerves II through XII grossly intact, motor grossly intact throughout PSYCH:  Cognitively intact, oriented to person place and time    EKG:  EKG is ordered today. The ekg ordered today demonstrates sinus rhythm. Rate 99 bpm.   Recent Labs: No results found for requested labs within last 8760 hours.   10/68/17: Total cholesterol 263, triglycerides 129, HDL 62, LDL 175   Lipid Panel    Component Value Date/Time   CHOL 235 (H) 10/15/2010 2018   TRIG 106  10/15/2010 2018   HDL 56 10/15/2010 2018   CHOLHDL 4.2 Ratio 10/15/2010 2018   VLDL 21 10/15/2010 2018   LDLCALC 158 (H) 10/15/2010 2018      Wt Readings from Last 3 Encounters:  02/13/17 111.9 kg (246 lb 9.6 oz)  03/31/12 93.3 kg (205 lb 9.6 oz)      ASSESSMENT AND PLAN:  # Hyperlipidemia: Michelle [name] LDL is 175.  She hasn't tolerated statins in the past, though she is unsure which she tired.  Unfortunately she wouldn't qualify for a PCSK9 inhibitor due to lack of known ASCVD or familial hyperlipidemia. We discussed starting Zetia.  She is very averse to trying any medications.  She hasn't made very serious dietary or exercise interventions.  She will work on this for the next three months and we will repeat her lipids.  If there is no improvement she will consider Zetia.  # Stage I hypertension: Blood pressure above goal.  Diet and exercise as above.  Current medicines are reviewed at length with the patient today.  The patient does not have concerns regarding medicines.  The following changes have been made:  no change  Labs/ tests ordered today include:  No orders of the defined types were placed in this encounter.  Time spent: 45 minutes-Greater than 50% of this time was spent in counseling, explanation of diagnosis, planning of further management, and coordination of care.   Disposition:   FU with Sayer Masini C. Oval Linsey, MD, [facility] in 3 months.    This note was written with the assistance of speech recognition software.  Please excuse any transcriptional errors.  Signed, Jarrett Albor C. Oval Linsey, MD, [facility]  02/16/2017 12:59 PM    [place]

## 2017-02-16 ENCOUNTER — Encounter: Payer: Self-pay | Admitting: Cardiovascular Disease

## 2017-02-16 DIAGNOSIS — I10 Essential (primary) hypertension: Secondary | ICD-10-CM

## 2017-02-16 DIAGNOSIS — E785 Hyperlipidemia, unspecified: Secondary | ICD-10-CM | POA: Insufficient documentation

## 2017-02-16 DIAGNOSIS — E039 Hypothyroidism, unspecified: Secondary | ICD-10-CM | POA: Insufficient documentation

## 2017-02-16 HISTORY — DX: Morbid (severe) obesity due to excess calories: E66.01

## 2017-02-16 HISTORY — DX: Hyperlipidemia, unspecified: E78.5

## 2017-02-16 HISTORY — DX: Hypothyroidism, unspecified: E03.9

## 2017-02-16 HISTORY — DX: Essential (primary) hypertension: I10

## 2017-04-08 ENCOUNTER — Other Ambulatory Visit: Payer: Self-pay | Admitting: Internal Medicine

## 2017-04-08 DIAGNOSIS — Z1231 Encounter for screening mammogram for malignant neoplasm of breast: Secondary | ICD-10-CM

## 2017-04-22 ENCOUNTER — Ambulatory Visit
Admission: RE | Admit: 2017-04-22 | Discharge: 2017-04-22 | Disposition: A | Discharge status: Home / Self Care | Payer: BLUE CROSS/BLUE SHIELD | Source: Ambulatory Visit | Attending: Internal Medicine | Admitting: Internal Medicine

## 2017-04-22 DIAGNOSIS — Z1231 Encounter for screening mammogram for malignant neoplasm of breast: Secondary | ICD-10-CM

## 2017-10-06 ENCOUNTER — Encounter (HOSPITAL_COMMUNITY): Payer: Self-pay

## 2019-05-14 ENCOUNTER — Other Ambulatory Visit: Payer: Self-pay | Admitting: Internal Medicine

## 2019-05-14 DIAGNOSIS — Z1231 Encounter for screening mammogram for malignant neoplasm of breast: Secondary | ICD-10-CM

## 2019-05-18 ENCOUNTER — Telehealth: Payer: Self-pay | Admitting: Cardiovascular Disease

## 2019-05-18 NOTE — Telephone Encounter (Signed)
Lm about recall

## 2019-06-30 ENCOUNTER — Other Ambulatory Visit: Payer: Self-pay

## 2019-06-30 ENCOUNTER — Ambulatory Visit
Admission: RE | Admit: 2019-06-30 | Discharge: 2019-06-30 | Disposition: A | Discharge status: Home / Self Care | Payer: BC Managed Care – PPO | Source: Ambulatory Visit | Attending: Internal Medicine | Admitting: Internal Medicine

## 2019-06-30 DIAGNOSIS — Z1231 Encounter for screening mammogram for malignant neoplasm of breast: Secondary | ICD-10-CM

## 2020-01-07 ENCOUNTER — Encounter: Payer: Self-pay | Admitting: General Practice

## 2020-01-11 ENCOUNTER — Other Ambulatory Visit: Payer: Self-pay | Admitting: Internal Medicine

## 2020-01-11 ENCOUNTER — Ambulatory Visit
Admission: RE | Admit: 2020-01-11 | Discharge: 2020-01-11 | Disposition: A | Discharge status: Home / Self Care | Payer: BC Managed Care – PPO | Source: Ambulatory Visit | Attending: Internal Medicine | Admitting: Internal Medicine

## 2020-01-11 DIAGNOSIS — M25561 Pain in right knee: Secondary | ICD-10-CM

## 2020-03-21 ENCOUNTER — Encounter: Payer: Self-pay | Admitting: General Practice

## 2020-06-02 ENCOUNTER — Other Ambulatory Visit: Payer: Self-pay | Admitting: Internal Medicine

## 2020-06-02 DIAGNOSIS — Z1231 Encounter for screening mammogram for malignant neoplasm of breast: Secondary | ICD-10-CM

## 2020-07-04 ENCOUNTER — Other Ambulatory Visit: Payer: Self-pay

## 2020-07-04 ENCOUNTER — Ambulatory Visit
Admission: RE | Admit: 2020-07-04 | Discharge: 2020-07-04 | Disposition: A | Discharge status: Home / Self Care | Payer: BC Managed Care – PPO | Source: Ambulatory Visit | Attending: Internal Medicine | Admitting: Internal Medicine

## 2020-07-04 DIAGNOSIS — Z1231 Encounter for screening mammogram for malignant neoplasm of breast: Secondary | ICD-10-CM

## 2021-06-27 ENCOUNTER — Other Ambulatory Visit: Payer: Self-pay | Admitting: Internal Medicine

## 2021-06-27 DIAGNOSIS — Z1231 Encounter for screening mammogram for malignant neoplasm of breast: Secondary | ICD-10-CM

## 2021-08-10 ENCOUNTER — Ambulatory Visit
Admission: RE | Admit: 2021-08-10 | Discharge: 2021-08-10 | Disposition: A | Discharge status: Home / Self Care | Payer: BC Managed Care – PPO | Source: Ambulatory Visit | Attending: Internal Medicine | Admitting: Internal Medicine

## 2021-08-10 ENCOUNTER — Other Ambulatory Visit: Payer: Self-pay

## 2021-08-10 DIAGNOSIS — Z1231 Encounter for screening mammogram for malignant neoplasm of breast: Secondary | ICD-10-CM

## 2022-03-08 ENCOUNTER — Other Ambulatory Visit: Payer: Self-pay | Admitting: Internal Medicine

## 2022-03-08 ENCOUNTER — Ambulatory Visit
Admission: RE | Admit: 2022-03-08 | Discharge: 2022-03-08 | Disposition: A | Discharge status: Home / Self Care | Payer: BC Managed Care – PPO | Source: Ambulatory Visit | Attending: Internal Medicine | Admitting: Internal Medicine

## 2022-03-08 DIAGNOSIS — M4724 Other spondylosis with radiculopathy, thoracic region: Secondary | ICD-10-CM

## 2022-03-11 ENCOUNTER — Other Ambulatory Visit: Payer: Self-pay | Admitting: Internal Medicine

## 2022-03-11 DIAGNOSIS — M545 Low back pain, unspecified: Secondary | ICD-10-CM

## 2022-04-03 ENCOUNTER — Other Ambulatory Visit: Payer: BC Managed Care – PPO

## 2022-04-12 ENCOUNTER — Other Ambulatory Visit: Payer: Self-pay | Admitting: Orthopedic Surgery

## 2022-04-12 DIAGNOSIS — M545 Low back pain, unspecified: Secondary | ICD-10-CM

## 2022-04-27 ENCOUNTER — Ambulatory Visit
Admission: RE | Admit: 2022-04-27 | Discharge: 2022-04-27 | Disposition: A | Discharge status: Home / Self Care | Payer: BC Managed Care – PPO | Source: Ambulatory Visit | Attending: Orthopedic Surgery | Admitting: Orthopedic Surgery

## 2022-04-27 DIAGNOSIS — M545 Low back pain, unspecified: Secondary | ICD-10-CM

## 2022-05-01 ENCOUNTER — Other Ambulatory Visit: Payer: Self-pay | Admitting: Orthopedic Surgery

## 2022-05-01 ENCOUNTER — Encounter: Payer: Self-pay | Admitting: *Deleted

## 2022-05-01 NOTE — Progress Notes (Signed)
Reached out to Michelle [name] to introduce myself as the office RN Navigator and explain our new patient process. Reviewed the reason for their referral and scheduled their new patient appointment along with labs. Provided address and directions to the office including call back phone number. Reviewed with patient any concerns they may have or any possible barriers to attending their appointment.   Informed patient about my role as a navigator and that I will meet with them prior to their New Patient appointment and more fully discuss what services I can provide. At this time patient has no further questions or needs.    Oncology Nurse Navigator Documentation     05/01/2022    8:45 AM  Oncology Nurse Navigator Flowsheets  Abnormal Finding Date 04/27/2022  Diagnosis Status Additional Work Up  Navigator Follow Up Date: 05/02/2022  Navigator Follow Up Reason: Follow-up Appointment  Navigator Location [facility]  Referral Date to RadOnc/MedOnc 04/30/2022  Navigator Encounter Type Introductory Phone Call  Patient Visit Type MedOnc  Treatment Phase Abnormal Scans  Barriers/Navigation Needs Coordination of Care;Education  Education Other  Interventions Coordination of Care;Education  Acuity Level 2-Minimal Needs (1-2 Barriers Identified)  Coordination of Care Appts  Education Method Verbal  Time Spent with Patient 30

## 2022-05-02 ENCOUNTER — Telehealth: Payer: Self-pay

## 2022-05-02 ENCOUNTER — Inpatient Hospital Stay: Payer: BC Managed Care – PPO | Attending: Hematology & Oncology

## 2022-05-02 ENCOUNTER — Encounter: Payer: Self-pay | Admitting: *Deleted

## 2022-05-02 ENCOUNTER — Inpatient Hospital Stay: Payer: BC Managed Care – PPO | Admitting: Hematology & Oncology

## 2022-05-02 ENCOUNTER — Other Ambulatory Visit: Payer: Self-pay

## 2022-05-02 ENCOUNTER — Encounter: Payer: Self-pay | Admitting: Hematology & Oncology

## 2022-05-02 VITALS — BP 131/75 | HR 115 | Temp 98.1°F | Resp 18 | Ht 64.25 in | Wt 215.4 lb

## 2022-05-02 DIAGNOSIS — Z801 Family history of malignant neoplasm of trachea, bronchus and lung: Secondary | ICD-10-CM | POA: Diagnosis not present

## 2022-05-02 DIAGNOSIS — J90 Pleural effusion, not elsewhere classified: Secondary | ICD-10-CM | POA: Diagnosis not present

## 2022-05-02 DIAGNOSIS — M4856XA Collapsed vertebra, not elsewhere classified, lumbar region, initial encounter for fracture: Secondary | ICD-10-CM | POA: Insufficient documentation

## 2022-05-02 DIAGNOSIS — I10 Essential (primary) hypertension: Secondary | ICD-10-CM | POA: Diagnosis not present

## 2022-05-02 DIAGNOSIS — M899 Disorder of bone, unspecified: Secondary | ICD-10-CM

## 2022-05-02 DIAGNOSIS — R937 Abnormal findings on diagnostic imaging of other parts of musculoskeletal system: Secondary | ICD-10-CM | POA: Diagnosis not present

## 2022-05-02 DIAGNOSIS — M4854XA Collapsed vertebra, not elsewhere classified, thoracic region, initial encounter for fracture: Secondary | ICD-10-CM | POA: Diagnosis not present

## 2022-05-02 DIAGNOSIS — E8809 Other disorders of plasma-protein metabolism, not elsewhere classified: Secondary | ICD-10-CM | POA: Insufficient documentation

## 2022-05-02 DIAGNOSIS — Z87891 Personal history of nicotine dependence: Secondary | ICD-10-CM | POA: Diagnosis not present

## 2022-05-02 DIAGNOSIS — C7951 Secondary malignant neoplasm of bone: Secondary | ICD-10-CM

## 2022-05-02 DIAGNOSIS — D649 Anemia, unspecified: Secondary | ICD-10-CM

## 2022-05-02 LAB — CBC WITH DIFFERENTIAL (CANCER CENTER ONLY)
Abs Immature Granulocytes: 0.09 10*3/uL — ABNORMAL HIGH (ref 0.00–0.07)
Basophils Absolute: 0 10*3/uL (ref 0.0–0.1)
Basophils Relative: 0 %
Eosinophils Absolute: 0.1 10*3/uL (ref 0.0–0.5)
Eosinophils Relative: 1 %
HCT: 30.3 % — ABNORMAL LOW (ref 36.0–46.0)
Hemoglobin: 9.5 g/dL — ABNORMAL LOW (ref 12.0–15.0)
Immature Granulocytes: 1 %
Lymphocytes Relative: 30 %
Lymphs Abs: 2.4 10*3/uL (ref 0.7–4.0)
MCH: 25.5 pg — ABNORMAL LOW (ref 26.0–34.0)
MCHC: 31.4 g/dL (ref 30.0–36.0)
MCV: 81.5 fL (ref 80.0–100.0)
Monocytes Absolute: 2.1 10*3/uL — ABNORMAL HIGH (ref 0.1–1.0)
Monocytes Relative: 26 %
Neutro Abs: 3.3 10*3/uL (ref 1.7–7.7)
Neutrophils Relative %: 42 %
Platelet Count: 153 10*3/uL (ref 150–400)
RBC: 3.72 MIL/uL — ABNORMAL LOW (ref 3.87–5.11)
RDW: 16 % — ABNORMAL HIGH (ref 11.5–15.5)
WBC Count: 7.9 10*3/uL (ref 4.0–10.5)
nRBC: 0 % (ref 0.0–0.2)

## 2022-05-02 LAB — CMP (CANCER CENTER ONLY)
ALT: 22 U/L (ref 0–44)
AST: 28 U/L (ref 15–41)
Albumin: 4.5 g/dL (ref 3.5–5.0)
Alkaline Phosphatase: 86 U/L (ref 38–126)
Anion gap: 8 (ref 5–15)
BUN: 13 mg/dL (ref 8–23)
CO2: 31 mmol/L (ref 22–32)
Calcium: 12 mg/dL — ABNORMAL HIGH (ref 8.9–10.3)
Chloride: 99 mmol/L (ref 98–111)
Creatinine: 1.01 mg/dL — ABNORMAL HIGH (ref 0.44–1.00)
GFR, Estimated: >60 mL/min (ref >60)
Glucose, Bld: 112 mg/dL — ABNORMAL HIGH (ref 70–99)
Potassium: 3.8 mmol/L (ref 3.5–5.1)
Sodium: 138 mmol/L (ref 135–145)
Total Bilirubin: 0.4 mg/dL (ref 0.3–1.2)
Total Protein: 8.6 g/dL — ABNORMAL HIGH (ref 6.5–8.1)

## 2022-05-02 LAB — SAMPLE TO BLOOD BANK

## 2022-05-02 LAB — SAVE SMEAR(SSMR), FOR PROVIDER SLIDE REVIEW

## 2022-05-02 LAB — CEA (IN HOUSE-CHCC): CEA (CHCC-In House): <1 ng/mL (ref 0.00–5.00)

## 2022-05-02 LAB — LACTATE DEHYDROGENASE: LDH: 278 U/L — ABNORMAL HIGH (ref 98–192)

## 2022-05-02 NOTE — Progress Notes (Signed)
Initial RN Navigator Patient Visit  Name: Michelle [name] Date of Referral : 04/30/2022 Diagnosis: Bone Lesions  Met with patient prior to their visit with MD. Hanley Seamen patient "Your Patient Navigator" handout which explains my role, areas in which I am able to help, and all the contact information for myself and the office. Also gave patient MD and Navigator business card. Reviewed with patient the general overview of expected course after initial diagnosis and time frame for all steps to be completed.  New patient packet given to patient which includes: orientation to office and staff; campus directory; education on My Chart and Advance Directives; and patient centered education on cancer.   Patient comes with her cousin Regino Schultze. She is a good support for patient and can be a driver if needed. She lives with two adult children who she also states are supportive. She does work, but has no scheduling concerns. She is very nervous as she lost her husband 10 years ago to cancer. At this time, she has not shared any of this with her family, other than Regino Schultze and is eager to get a diagnosis.   Patient completed visit with Dr. Marin Olp.   Patient will need a PET and Bone Marrow Biopsy.   PET scheduled for 05/13/2022. Spoke to patient and reviewed appointment date, time and location with her. She is educated to the prep for PET  First BMBx is for July 24th. Message sent to [facility] and they can work patient in on 7/14/223.   Patient understands all follow up procedures and expectations. They have my number to reach out for any further clarification or additional needs.   Oncology Nurse Navigator Documentation     05/02/2022   11:00 AM  Oncology Nurse Navigator Flowsheets  Navigator Follow Up Date: 05/13/2022  Navigator Follow Up Reason: Scan Review  Navigator Location [facility]  Navigator Encounter Type Initial MedOnc;Telephone  Telephone Appt Confirmation/Clarification;Education;Outgoing Call   Patient Visit Type MedOnc  Treatment Phase Abnormal Scans  Barriers/Navigation Needs Coordination of Care;Education  Education Pain/ Symptom Management;Newly Diagnosed Cancer Education;Other  Interventions Coordination of Care;Education;Psycho-Social Support  Acuity Level 2-Minimal Needs (1-2 Barriers Identified)  Education Method Verbal;Written  Support Groups/Services Friends and Family  Time Spent with Patient 26

## 2022-05-02 NOTE — Telephone Encounter (Signed)
Called and informed pt of upcoming bmbx requested by MD.  Michelle [name] pt of her appointment at [facility] on 7/14 0730.  Pt informed she would be awake but have local anesthetic, pt verbalized understanding and thanks. Pt knows to call with any additional questions or concerns.

## 2022-05-02 NOTE — Progress Notes (Signed)
Referral MD  Reason for Referral: Multiple bony lesions/compression fractures of the spine.  Chief Complaint  Patient presents with   New Patient (Initial Visit)  : I have back fractures.  HPI: Ms. Michelle [name] is a very charming 61 year old African-American female.  I think she comes in with a cousin.  She she has been quite healthy.  She is from [place]  She has 3 children.  She was working I think for [employer]  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  She has been  having some back issues.  This is new.  Overall, she really has never had any count of problems.  She is having some pain.  The pain was in the lower spine.  She does not have any radicular type pain.  She ultimately had a MRI done.  This was done on 04/29/2022.  This showed widespread lesions throughout the spine including the pelvis and ribs consistent with metastatic disease.  Checking multiple compression fractures.  There were compression fractures  at T10-T11-T12-L1-L3 and L5.  She had a small right pleural effusion.  Based on this, she was referred to the [place].  She has not lost much weight.  She says she is try to lose a little weight.  Pressures had no change in bowel or bladder habits.  She did smoke.  She only has about a 15-pack-year history of tobacco use.  She stopped about 33 years ago.  There is been no change in bowel or bladder habits.  She has had her mammogram was last year.  She has had a colonoscopy was about 2 years ago.  There is been no rashes.  She is not noted any swollen lymph nodes.    I do not think she had much in the way of surgery.  In the family, I think there may be a history of cancer with her father and grandfather.  She really does not drink alcohol.  She has had no cough or shortness of breath.  She has had no headache.  There is been no problem with infections.  Overall, I would say performance status is probably ECOG 1.    Past Medical  History:  Diagnosis Date   Essential hypertension 02/16/2017   Hyperlipemia    Hyperlipidemia 02/16/2017   Hypertension    Hypothyroidism 02/16/2017   Morbid obesity ([place]) 02/16/2017  :   Past Surgical History:  Procedure Laterality Date   THYROIDECTOMY    :   Current Outpatient Medications:    Capsicum, Cayenne, (CAYENNE PO), Take 60 mg by mouth daily., Disp: , Rfl:    Coenzyme Q10 (COQ10) 100 MG CAPS, Take 1 capsule by mouth daily., Disp: , Rfl:    fluticasone (FLONASE) 50 MCG/ACT nasal spray, 1 SPRAY INTO EACH NOSTRIL EVERY DAY FOR 30 DAYS, Disp: , Rfl:    HYDROcodone-acetaminophen (NORCO/VICODIN) 5-325 MG tablet, Take 1 tablet by mouth every 6 (six) hours as needed., Disp: , Rfl:    levothyroxine (SYNTHROID) 88 MCG tablet, 1 TABLET EVERY MORNING ON AN EMPTY STOMACH ORALLY ONCE A DAY 90 DAYS, Disp: , Rfl:    lisinopril-hydrochlorothiazide (ZESTORETIC) 10-12.5 MG tablet, Take 1 tablet by mouth daily., Disp: , Rfl:    Omega-3 Fatty Acids (FISH OIL) 1000 MG CAPS, Take 1 capsule by mouth daily., Disp: , Rfl:    rosuvastatin (CRESTOR) 10 MG tablet, TAKE 1 TABLET BY MOUTH EVERY DAY FOR 90 DAYS, Disp: , Rfl: :  :   Allergies  Allergen Reactions   Penicillins Other (See Comments)    Severe Yeast infection... No deathly reactions   Codeine Nausea Only   Morphine Other (See Comments)    headache  :   Family History  Problem Relation Age of Onset   Diabetes Paternal Grandfather    Stroke Paternal Grandfather    Heart attack Paternal Grandfather    Diabetes Father    Hypertension Father    Stroke Father    Lung cancer Father    Hypertension Mother    Kidney disease Mother    Hypertension Sister    Stroke Paternal Grandmother    Heart attack Maternal Grandmother   :   Social History   Socioeconomic History   Marital status: Widowed    Spouse name: Not on file   Number of children: Not on file   Years of education: Not on file   Highest education level: Not on file   Occupational History   Not on file  Tobacco Use   Smoking status: Former   Smokeless tobacco: Never  Vaping Use   Vaping Use: Never used  Substance and Sexual Activity   Alcohol use: No   Drug use: No   Sexual activity: Not Currently    Birth control/protection: Post-menopausal  Other Topics Concern   Not on file  Social History Narrative   Not on file   Social Determinants of Health   Financial Resource Strain: Not on file  Food Insecurity: Not on file  Transportation Needs: Not on file  Physical Activity: Not on file  Stress: Not on file  Social Connections: Not on file  Intimate Partner Violence: Not on file  :  Review of Systems  Constitutional: Negative.   HENT: Negative.    Eyes: Negative.   Respiratory: Negative.    Cardiovascular: Negative.   Gastrointestinal: Negative.   Genitourinary: Negative.   Musculoskeletal:  Positive for back pain and joint pain.  Skin: Negative.   Neurological: Negative.   Endo/Heme/Allergies: Negative.   Psychiatric/Behavioral: Negative.       Exam: Vital signs are temperature of 98.1.  Pulse 115.  Blood pressure 131/75.  Weight is 215 pounds. $RemoveBef'@IPVITALS'rXlPjlxurI$ @ Physical Exam Vitals reviewed.  HENT:     Head: Normocephalic and atraumatic.  Eyes:     Pupils: Pupils are equal, round, and reactive to light.  Cardiovascular:     Rate and Rhythm: Normal rate and regular rhythm.     Heart sounds: Normal heart sounds.  Pulmonary:     Effort: Pulmonary effort is normal.     Breath sounds: Normal breath sounds.  Abdominal:     General: Bowel sounds are normal.     Palpations: Abdomen is soft.  Musculoskeletal:        General: No tenderness or deformity. Normal range of motion.     Cervical back: Normal range of motion.  Lymphadenopathy:     Cervical: No cervical adenopathy.  Skin:    General: Skin is warm and dry.     Findings: No erythema or rash.  Neurological:     Mental Status: She is alert and oriented to person, place,  and time.  Psychiatric:        Behavior: Behavior normal.        Thought Content: Thought content normal.        Judgment: Judgment normal.     Recent Labs    05/02/22 1054  WBC 7.9  HGB 9.5*  HCT 30.3*  PLT 153  Recent Labs    05/02/22 1054  NA 138  K 3.8  CL 99  CO2 31  GLUCOSE 112*  BUN 13  CREATININE 1.01*  CALCIUM 12.0*    Blood smear review: On her blood smear, there may be some rouleaux formation.  I do not see any immature myeloid cells.  There is no obvious leukemic cells.  I do not see any plasma cells.  Red cells show no nucleated red blood cells.  I see no schistocytes or spherocytes.  Platelets are adequate number and size.  Pathology: None    Assessment and Plan: Ms. Miranda is a very charming 61 year old African-American female.  She has extensive spinal disease.  Again, we do not know what this might be.  She is anemic.  Her protein level is little bit on the high side.  As such, I think we have to worry about myeloma.  Hopefully, this would be myeloma because myeloma would be the easiest disease to treat with her.  We are going to have to get a bone marrow biopsy on her.  I talked her about this.  I explained how it was done.  She is in agreement.  I also believe that we have to get a CT scan/PET scan.  She does have a remote history of tobacco use.  Again I would think it would be unlikely that this is lung cancer but yet this is a possibility.  I would not think this is breast cancer.  She has had mammograms routinely.  Hopefully, the bone marrow biopsy can give [place] the diagnosis.  If not, then we may have to see about an actual bone biopsy.  Her calcium is on the high side.  I am not too worried about this because her albumin is so good.  Again we will have to watch this.  I really cannot make any recommendations right now as far as treatment or prognosis because we do not know what we are dealing with.  She clearly will need radiation therapy to  the back.  I do not know if she may need kyphoplasty to help with the compression fractures.  She has a brace on right now.  I am sure she will also need Xgeva.  She has a very strong faith.  We had a good prayer.  I gave her a prayer blanket which she was very thankful for.  We will plan to get her back once we get the results back from our biopsies and PET scan and lab work.

## 2022-05-03 LAB — KAPPA/LAMBDA LIGHT CHAINS
Kappa free light chain: 1153.8 mg/L — ABNORMAL HIGH (ref 3.3–19.4)
Kappa, lambda light chain ratio: 107.83 — ABNORMAL HIGH (ref 0.26–1.65)
Lambda free light chains: 10.7 mg/L (ref 5.7–26.3)

## 2022-05-03 LAB — CA 125: Cancer Antigen (CA) 125: 18.7 U/mL (ref 0.0–38.1)

## 2022-05-03 LAB — IGG, IGA, IGM
IgA: 22 mg/dL — ABNORMAL LOW (ref 87–352)
IgG (Immunoglobin G), Serum: 2284 mg/dL — ABNORMAL HIGH (ref 586–1602)
IgM (Immunoglobulin M), Srm: 21 mg/dL — ABNORMAL LOW (ref 26–217)

## 2022-05-03 LAB — CANCER ANTIGEN 27.29: CA 27.29: 144 U/mL — ABNORMAL HIGH (ref 0.0–38.6)

## 2022-05-03 LAB — BETA 2 MICROGLOBULIN, SERUM: Beta-2 Microglobulin: 4.7 mg/L — ABNORMAL HIGH (ref 0.6–2.4)

## 2022-05-06 ENCOUNTER — Telehealth: Payer: Self-pay

## 2022-05-06 NOTE — Telephone Encounter (Signed)
Called and informed patient of lab results, patient verbalized understanding and denies any questions or concerns at this time.   

## 2022-05-13 ENCOUNTER — Other Ambulatory Visit (HOSPITAL_COMMUNITY): Payer: BC Managed Care – PPO

## 2022-05-13 ENCOUNTER — Encounter (HOSPITAL_COMMUNITY)
Admission: RE | Admit: 2022-05-13 | Discharge: 2022-05-13 | Disposition: A | Discharge status: Home / Self Care | Payer: BC Managed Care – PPO | Source: Ambulatory Visit | Attending: Hematology & Oncology | Admitting: Hematology & Oncology

## 2022-05-13 DIAGNOSIS — C7951 Secondary malignant neoplasm of bone: Secondary | ICD-10-CM | POA: Diagnosis present

## 2022-05-13 LAB — GLUCOSE, CAPILLARY: Glucose-Capillary: 99 mg/dL (ref 70–99)

## 2022-05-13 MED ORDER — FLUDEOXYGLUCOSE F - 18 (FDG) INJECTION
10.7300 | Freq: Once | INTRAVENOUS | Status: AC | PRN
  Administered 2022-05-13: 10.73 via INTRAVENOUS

## 2022-05-15 ENCOUNTER — Encounter: Payer: Self-pay | Admitting: *Deleted

## 2022-05-15 LAB — IMMUNOFIXATION REFLEX, SERUM
IgA: 21 mg/dL — ABNORMAL LOW (ref 87–352)
IgG (Immunoglobin G), Serum: 2537 mg/dL — ABNORMAL HIGH (ref 586–1602)
IgM (Immunoglobulin M), Srm: 26 mg/dL (ref 26–217)

## 2022-05-15 LAB — PROTEIN ELECTROPHORESIS, SERUM, WITH REFLEX
A/G Ratio: 1 (ref 0.7–1.7)
Albumin ELP: 4 g/dL (ref 2.9–4.4)
Alpha-1-Globulin: 0.3 g/dL (ref 0.0–0.4)
Alpha-2-Globulin: 0.7 g/dL (ref 0.4–1.0)
Beta Globulin: 1 g/dL (ref 0.7–1.3)
Gamma Globulin: 2 g/dL — ABNORMAL HIGH (ref 0.4–1.8)
Globulin, Total: 4.1 g/dL — ABNORMAL HIGH (ref 2.2–3.9)
M-Spike, %: 1.8 g/dL — ABNORMAL HIGH
SPEP Interpretation: 0
Total Protein ELP: 8.1 g/dL (ref 6.0–8.5)

## 2022-05-15 NOTE — Progress Notes (Signed)
Reviewed PET scan results which suggest MM. Patient is already scheduled for BMBx on 05/17/2022. Will continue to follow through workup.  Oncology Nurse Navigator Documentation     05/15/2022    8:15 AM  Oncology Nurse Navigator Flowsheets  Navigator Follow Up Date: 05/17/2022  Navigator Follow Up Reason: Other:  Navigator Location [facility]  Navigator Encounter Type Scan Review  Patient Visit Type MedOnc  Treatment Phase Abnormal Scans  Barriers/Navigation Needs Coordination of Care;Education  Interventions None Required  Acuity Level 2-Minimal Needs (1-2 Barriers Identified)  Support Groups/Services Friends and Family  Time Spent with Patient 15

## 2022-05-16 ENCOUNTER — Other Ambulatory Visit: Payer: Self-pay | Admitting: *Deleted

## 2022-05-16 ENCOUNTER — Other Ambulatory Visit: Payer: Self-pay

## 2022-05-16 DIAGNOSIS — C7951 Secondary malignant neoplasm of bone: Secondary | ICD-10-CM

## 2022-05-17 ENCOUNTER — Inpatient Hospital Stay: Payer: BC Managed Care – PPO

## 2022-05-17 ENCOUNTER — Inpatient Hospital Stay: Payer: BC Managed Care – PPO | Attending: Hematology & Oncology | Admitting: Adult Health

## 2022-05-17 ENCOUNTER — Other Ambulatory Visit: Payer: Self-pay

## 2022-05-17 ENCOUNTER — Other Ambulatory Visit: Payer: Self-pay | Admitting: Hematology & Oncology

## 2022-05-17 ENCOUNTER — Encounter: Payer: Self-pay | Admitting: *Deleted

## 2022-05-17 VITALS — BP 117/70 | HR 107 | Temp 98.5°F | Resp 18

## 2022-05-17 DIAGNOSIS — D464 Refractory anemia, unspecified: Secondary | ICD-10-CM

## 2022-05-17 DIAGNOSIS — C7951 Secondary malignant neoplasm of bone: Secondary | ICD-10-CM

## 2022-05-17 DIAGNOSIS — D472 Monoclonal gammopathy: Secondary | ICD-10-CM

## 2022-05-17 MED ORDER — LIDOCAINE HCL 2 % IJ SOLN
INTRAMUSCULAR | Status: AC
  Filled 2022-05-17: qty 20

## 2022-05-17 MED ORDER — LIDOCAINE HCL 2 % IJ SOLN: 15.0000 mL | Freq: Once | INTRAMUSCULAR | Status: DC

## 2022-05-17 NOTE — Progress Notes (Unsigned)
Michelle Mckusick, DO  Donita Brooks D OK for CT guided mass biopsy.   Would let the doctor of the day decide.    Perhaps left iliac wing, or the anterior right chest wall mass.  PET 05/13/22

## 2022-05-17 NOTE — Progress Notes (Signed)
INDICATION: refractory anemia, MGUS, r/o myeloma  Brief examination was performed. ENT: adequate airway clearance Heart: regular rate and rhythm.No Murmurs Lungs: clear to auscultation, no wheezes, normal respiratory effort  Bone Marrow Biopsy and Aspiration Procedure Note   Informed consent was obtained and potential risks including bleeding, infection and pain were reviewed with the patient.  The patient's name, date of birth, identification, consent and allergies were verified prior to the start of procedure and time out was performed.  The right posterior iliac crest was chosen as the site of biopsy.  The skin was prepped with ChloraPrep.   Initially 10cc of 2% lidocaine was used to provide local anaesthesia.  The patient was incredibly uncomfortable and reported pain after biopsy attempt x 2.  After providing additional local anaesthesia with 8cc of 2% licocaine, patient remained in excessive pain and biopsy was aborted.    Patient reports significant needle phobia and would recommend biopsy be obtained through IR with conscious sedation.    Siite was bandaged, no blood loss noted.    BLOOD LOSS: none   Signed Scot Dock, NP

## 2022-05-17 NOTE — Progress Notes (Unsigned)
Ct biopsy

## 2022-05-17 NOTE — Progress Notes (Signed)
Patient observed full 30 minutes following attempted bone marrow biopsy. Nutrition provided. Emotional support provided. Patient is very apologetic about not being able to complete biopsy. She reports stress about returning to the [place] due to her husband's history here. Patient states "this was just too hard".   Vital signs stable at discharge. See flowsheet. Bandage checked- no signs of drainage or blood. Patient assisted with brace and ambulated to lobby with cane.

## 2022-05-17 NOTE — Progress Notes (Addendum)
Patient had her bone marrow this am. After a few attempts patient was unable to tolerate procedure. She will need to have it completed in IR with conscious sedation. New orders placed. Will follow for scheduling.   Oncology Nurse Navigator Documentation     05/17/2022    8:15 AM  Oncology Nurse Navigator Flowsheets  Navigator Follow Up Date: 05/22/2022  Navigator Follow Up Reason: Appointment Review  Navigator Location [facility]  Navigator Encounter Type Appt/Treatment Plan Review  Patient Visit Type MedOnc  Treatment Phase Abnormal Scans  Barriers/Navigation Needs Coordination of Care;Education  Interventions None Required  Acuity Level 2-Minimal Needs (1-2 Barriers Identified)  Support Groups/Services Friends and Family  Time Spent with Patient 15

## 2022-05-17 NOTE — Patient Instructions (Signed)
Bone Marrow Aspiration and Bone Marrow Biopsy, Adult, Care After This sheet gives you information about how to care for yourself after your procedure. Your health care provider may also give you more specific instructions. If you have problems or questions, contact your health care provider. What can I expect after the procedure? After the procedure, it is common to have: Mild pain and tenderness. Swelling. Bruising. Follow these instructions at home: Puncture site care  Follow instructions from your health care provider about how to take care of the puncture site. Make sure you: Wash your hands with soap and water before and after you change your bandage (dressing). If soap and water are not available, use hand sanitizer. Change your dressing as told by your health care provider. Check your puncture site every day for signs of infection. Check for: More redness, swelling, or pain. Fluid or blood. Warmth. Pus or a bad smell. Activity Return to your normal activities as told by your health care provider. Ask your health care provider what activities are safe for you. Do not lift anything that is heavier than 10 lb (4.5 kg), or the limit that you are told, until your health care provider says that it is safe. Do not drive for 24 hours if you were given a sedative during your procedure. General instructions  Take over-the-counter and prescription medicines only as told by your health care provider. Do not take baths, swim, or use a hot tub until your health care provider approves. Ask your health care provider if you may take showers. You may only be allowed to take sponge baths. If directed, put ice on the affected area. To do this: Put ice in a plastic bag. Place a towel between your skin and the bag. Leave the ice on for 20 minutes, 2-3 times a day. Keep all follow-up visits as told by your health care provider. This is important. Contact a health care provider if: Your pain is not  controlled with medicine. You have a fever. You have more redness, swelling, or pain around the puncture site. You have fluid or blood coming from the puncture site. Your puncture site feels warm to the touch. You have pus or a bad smell coming from the puncture site. Summary After the procedure, it is common to have mild pain, tenderness, swelling, and bruising. Follow instructions from your health care provider about how to take care of the puncture site and what activities are safe for you. Take over-the-counter and prescription medicines only as told by your health care provider. Contact a health care provider if you have any signs of infection, such as fluid or blood coming from the puncture site. This information is not intended to replace advice given to you by your health care provider. Make sure you discuss any questions you have with your health care provider. Document Revised: 03/09/2019 Document Reviewed: 03/09/2019 Elsevier Patient Education  [place].

## 2022-05-22 ENCOUNTER — Encounter: Payer: Self-pay | Admitting: *Deleted

## 2022-05-22 NOTE — Progress Notes (Signed)
Bone Marrow biopsy scheduled for 06/07/2022.  Oncology Nurse Navigator Documentation     05/22/2022    8:45 AM  Oncology Nurse Navigator Flowsheets  Navigator Follow Up Date: 06/07/2022  Navigator Follow Up Reason: Other:  Navigator Location [facility]  Navigator Encounter Type Appt/Treatment Plan Review  Patient Visit Type MedOnc  Treatment Phase Abnormal Scans  Barriers/Navigation Needs Coordination of Care;Education  Interventions None Required  Acuity Level 2-Minimal Needs (1-2 Barriers Identified)  Support Groups/Services Friends and Family  Time Spent with Patient 15    

## 2022-05-27 ENCOUNTER — Ambulatory Visit (HOSPITAL_COMMUNITY): Payer: BC Managed Care – PPO

## 2022-05-27 ENCOUNTER — Telehealth: Payer: Self-pay

## 2022-05-27 DIAGNOSIS — C7951 Secondary malignant neoplasm of bone: Secondary | ICD-10-CM

## 2022-05-27 MED ORDER — METHYLPREDNISOLONE 4 MG PO TBPK: ORAL_TABLET | ORAL | 0 refills | Status: DC

## 2022-05-27 NOTE — Telephone Encounter (Signed)
Pt called in stating that her buttock is hurting and the pain is radiating down her leg. Per Dr Marin Olp we are still waiting on several scans. Pt needs an MRI done.   Tried to contact pt and there was NANM. Will try again later.

## 2022-05-27 NOTE — Telephone Encounter (Signed)
Spoke with pt who states she already had an MRI 04/27/22. MRI was of thoracic and lumbar area. Pt states her pain is bad. Pain score is a 1 laying down and a 10 when she gets up. Pt is taking Hydrocodone 5-325 mg for pain and states it does help but she is only tying to take it 1-2 xs daily.  Per Dr Marin Olp he does want to repeat the MRI of her spine. He would like pt to start the Medrol dose pack and continue Hydrocodone rx as needed. Pt advised and requested rx be sent to [facility] on [address]. Rx sent and pt aware office will contact her to schedule MRI.

## 2022-05-28 ENCOUNTER — Other Ambulatory Visit: Payer: Self-pay | Admitting: Hematology & Oncology

## 2022-05-28 DIAGNOSIS — D464 Refractory anemia, unspecified: Secondary | ICD-10-CM

## 2022-06-03 ENCOUNTER — Telehealth: Payer: Self-pay

## 2022-06-03 ENCOUNTER — Other Ambulatory Visit: Payer: Self-pay

## 2022-06-03 DIAGNOSIS — F40240 Claustrophobia: Secondary | ICD-10-CM

## 2022-06-03 MED ORDER — CELECOXIB 200 MG PO CAPS: 200.0000 mg | ORAL_CAPSULE | Freq: Two times a day (BID) | ORAL | 3 refills | Status: DC

## 2022-06-03 MED ORDER — DIAZEPAM 5 MG PO TABS: 5.0000 mg | ORAL_TABLET | Freq: Once | ORAL | 0 refills | Status: AC

## 2022-06-03 NOTE — Telephone Encounter (Signed)
Received call from pt reporting she continues to have pain in her buttocks that radiates down her leg. She is out of hydrocodone, but does not like to "take too much of that kind of medicine." She has been taking Tylenol and Ibuprofen OTC. She is scheduled for bmbx and MRI to identify cause of pain. Pt requests medication to "help her calm down" for MRI d/t claustrophobia.   Per Dr Marin Olp, scripts sent for Celebrex for pain and Valium for MRI. Pt verbalizes understanding and appreciation. dph

## 2022-06-05 ENCOUNTER — Other Ambulatory Visit: Payer: Self-pay | Admitting: Hematology & Oncology

## 2022-06-05 ENCOUNTER — Telehealth: Payer: Self-pay

## 2022-06-05 DIAGNOSIS — M5440 Lumbago with sciatica, unspecified side: Secondary | ICD-10-CM

## 2022-06-05 MED ORDER — GABAPENTIN 300 MG PO CAPS: 300.0000 mg | ORAL_CAPSULE | Freq: Four times a day (QID) | ORAL | 0 refills | Status: DC

## 2022-06-05 NOTE — Telephone Encounter (Signed)
Pt called in stating that she was still having pain in her buttocks that radiates down to the right leg. Pt started crying and said she is tired of being in pain. MRI is currently scheduled for 06/12/22. Pt states she is only taking the Celebrex for pain as she was unaware if she could mix Tylenol or Ibuprofen with the Celebrex. Per Dr Marin Olp- MRI needs to be moved up. Referral placed to have this complete tomorrow. Rx for Gabapentin sent in to take for pain. Pt made aware and advised to call central scheduling if she does not hear from scheduling by this afternoon.

## 2022-06-06 ENCOUNTER — Other Ambulatory Visit (HOSPITAL_COMMUNITY): Payer: Self-pay | Admitting: Physician Assistant

## 2022-06-06 ENCOUNTER — Other Ambulatory Visit: Payer: Self-pay | Admitting: Student

## 2022-06-06 ENCOUNTER — Other Ambulatory Visit: Payer: Self-pay | Admitting: Radiology

## 2022-06-06 ENCOUNTER — Ambulatory Visit (HOSPITAL_COMMUNITY)
Admission: RE | Admit: 2022-06-06 | Discharge: 2022-06-06 | Disposition: A | Discharge status: Home / Self Care | Payer: BC Managed Care – PPO | Source: Ambulatory Visit | Attending: Hematology & Oncology | Admitting: Hematology & Oncology

## 2022-06-06 DIAGNOSIS — M5440 Lumbago with sciatica, unspecified side: Secondary | ICD-10-CM | POA: Diagnosis present

## 2022-06-06 DIAGNOSIS — D464 Refractory anemia, unspecified: Secondary | ICD-10-CM

## 2022-06-06 MED ORDER — GADOBUTROL 1 MMOL/ML IV SOLN
10.0000 mL | Freq: Once | INTRAVENOUS | Status: AC | PRN
  Administered 2022-06-06: 10 mL via INTRAVENOUS

## 2022-06-07 ENCOUNTER — Other Ambulatory Visit: Payer: Self-pay | Admitting: Hematology & Oncology

## 2022-06-07 ENCOUNTER — Encounter (HOSPITAL_COMMUNITY): Payer: Self-pay

## 2022-06-07 ENCOUNTER — Other Ambulatory Visit: Payer: Self-pay

## 2022-06-07 ENCOUNTER — Ambulatory Visit (HOSPITAL_COMMUNITY)
Admission: RE | Admit: 2022-06-07 | Discharge: 2022-06-07 | Disposition: A | Discharge status: Home / Self Care | Payer: BC Managed Care – PPO | Source: Ambulatory Visit | Attending: Hematology & Oncology | Admitting: Hematology & Oncology

## 2022-06-07 DIAGNOSIS — Z6831 Body mass index (BMI) 31.0-31.9, adult: Secondary | ICD-10-CM | POA: Diagnosis not present

## 2022-06-07 DIAGNOSIS — E039 Hypothyroidism, unspecified: Secondary | ICD-10-CM | POA: Diagnosis not present

## 2022-06-07 DIAGNOSIS — E785 Hyperlipidemia, unspecified: Secondary | ICD-10-CM | POA: Diagnosis not present

## 2022-06-07 DIAGNOSIS — D464 Refractory anemia, unspecified: Secondary | ICD-10-CM | POA: Diagnosis not present

## 2022-06-07 DIAGNOSIS — I10 Essential (primary) hypertension: Secondary | ICD-10-CM | POA: Diagnosis not present

## 2022-06-07 DIAGNOSIS — D472 Monoclonal gammopathy: Secondary | ICD-10-CM

## 2022-06-07 DIAGNOSIS — C9000 Multiple myeloma not having achieved remission: Secondary | ICD-10-CM | POA: Diagnosis not present

## 2022-06-07 LAB — CBC WITH DIFFERENTIAL/PLATELET
Abs Immature Granulocytes: 0.14 10*3/uL — ABNORMAL HIGH (ref 0.00–0.07)
Basophils Absolute: 0 10*3/uL (ref 0.0–0.1)
Basophils Relative: 0 %
Eosinophils Absolute: 0.1 10*3/uL (ref 0.0–0.5)
Eosinophils Relative: 1 %
HCT: 28.2 % — ABNORMAL LOW (ref 36.0–46.0)
Hemoglobin: 9.1 g/dL — ABNORMAL LOW (ref 12.0–15.0)
Immature Granulocytes: 2 %
Lymphocytes Relative: 21 %
Lymphs Abs: 1.9 10*3/uL (ref 0.7–4.0)
MCH: 26.5 pg (ref 26.0–34.0)
MCHC: 32.3 g/dL (ref 30.0–36.0)
MCV: 82.2 fL (ref 80.0–100.0)
Monocytes Absolute: 2.6 10*3/uL — ABNORMAL HIGH (ref 0.1–1.0)
Monocytes Relative: 30 %
Neutro Abs: 4.1 10*3/uL (ref 1.7–7.7)
Neutrophils Relative %: 46 %
Platelets: 175 10*3/uL (ref 150–400)
RBC: 3.43 MIL/uL — ABNORMAL LOW (ref 3.87–5.11)
RDW: 17.6 % — ABNORMAL HIGH (ref 11.5–15.5)
WBC: 8.8 10*3/uL (ref 4.0–10.5)
nRBC: 0 % (ref 0.0–0.2)

## 2022-06-07 MED ORDER — HYDROMORPHONE HCL 2 MG PO TABS: 2.0000 mg | ORAL_TABLET | Freq: Four times a day (QID) | ORAL | 0 refills | Status: DC | PRN

## 2022-06-07 MED ORDER — FENTANYL CITRATE (PF) 100 MCG/2ML IJ SOLN
INTRAMUSCULAR | Status: AC
  Filled 2022-06-07: qty 2

## 2022-06-07 MED ORDER — MIDAZOLAM HCL 2 MG/2ML IJ SOLN
INTRAMUSCULAR | Status: AC | PRN
  Administered 2022-06-07: .5 mg via INTRAVENOUS

## 2022-06-07 MED ORDER — SODIUM CHLORIDE 0.9 % IV SOLN: INTRAVENOUS | Status: DC

## 2022-06-07 MED ORDER — MIDAZOLAM HCL 2 MG/2ML IJ SOLN
INTRAMUSCULAR | Status: AC
  Filled 2022-06-07: qty 4

## 2022-06-07 MED ORDER — DEXAMETHASONE 4 MG PO TABS: 8.0000 mg | ORAL_TABLET | Freq: Two times a day (BID) | ORAL | 0 refills | Status: DC

## 2022-06-07 MED ORDER — FLUCONAZOLE 100 MG PO TABS: 100.0000 mg | ORAL_TABLET | Freq: Every day | ORAL | 4 refills | Status: DC

## 2022-06-07 MED ORDER — DIPHENHYDRAMINE HCL 50 MG/ML IJ SOLN
INTRAMUSCULAR | Status: AC | PRN
  Administered 2022-06-07: 25 mg via INTRAVENOUS

## 2022-06-07 MED ORDER — FENTANYL CITRATE (PF) 100 MCG/2ML IJ SOLN
INTRAMUSCULAR | Status: AC | PRN
  Administered 2022-06-07: 50 ug via INTRAVENOUS

## 2022-06-07 MED ORDER — DIPHENHYDRAMINE HCL 50 MG/ML IJ SOLN
INTRAMUSCULAR | Status: AC
  Filled 2022-06-07: qty 1

## 2022-06-07 MED ORDER — MIDAZOLAM HCL 2 MG/2ML IJ SOLN
INTRAMUSCULAR | Status: AC | PRN
  Administered 2022-06-07: 1 mg via INTRAVENOUS

## 2022-06-07 MED ORDER — FENTANYL 12 MCG/HR TD PT72
1.0000 | MEDICATED_PATCH | TRANSDERMAL | 0 refills | Status: DC
Start: 2022-06-07 — End: 2022-06-12

## 2022-06-07 NOTE — Progress Notes (Signed)
I called her on the phone this afternoon.  I talked to her about her MRI report.  She has significant cancer/myeloma now in the lumbar spine.  She has some extradural disease.  She has some compression on nerve root.  She does not have any obvious fracture.  I have already spoken with Dr. Tammi Klippel of radiation Oncology.  He will get her in early next week to see about radiation therapy.  In talking with Ms. Doswell I told her that we need to get her some steroids to help with inflammation.  I started her on Decadron 8 mg p.o. twice daily.  She is to take this with food.  I will send in some Diflucan to help prevent her from having thrush.  She clearly needs to have some long-acting pain medicine.  I will send in a Duragesic patch 12.5 mcg.  She does have quite a few allergies.  Hopefully, she will tolerate this.  For short-term pain, we will try her on hydromorphone 2 mg p.o. every 6 hours as needed.  She understands all this.  Hopefully, we will have the bone marrow report back next week.  She had a bone marrow test done today.  She will call us if she has any problems.  [name], MD

## 2022-06-07 NOTE — Progress Notes (Signed)
Call to patient's designated contact - [name] - Daughter. No answer, VM full. Discussed with patient. Per patient, daughter in waiting area. RN to waiting area to find patient's daughter to discuss timeframes, etc.... Daughter - [name] found in waiting area. Discussed timeframes, discharge instructions and care at home post procedure. Verbalized understanding. Will be ready for call from RN post procedure and patient pick up.

## 2022-06-07 NOTE — Discharge Instructions (Signed)

## 2022-06-07 NOTE — H&P (Signed)
Chief Complaint: Patient was seen in consultation today for monoclonal gammopathy of unknown significance at the request of Ennever,Peter R  Referring Physician(s): Ennever,Peter R  Supervising Physician: Markus Daft  Patient Status: [facility] - Out-pt  History of Present Illness: Michelle [name] is a 61 y.o. female with PMH of hypertension, hyperlipidemia, obesity, and hypothyroidism being seen today for monoclonal gammopathy of unknown significance. Patient has been seen by oncology for multiple compression fractures of vertebrae with suspected malignancy. Patient previously attempted to undergo bone marrow biopsy with local anesthetic only on 7/14, but was unable to tolerate the procedure due to pain; she was referred to IR at this time for CT-guided bone marrow biopsy with sedation.  Past Medical History:  Diagnosis Date   Essential hypertension 02/16/2017   Hyperlipemia    Hyperlipidemia 02/16/2017   Hypertension    Hypothyroidism 02/16/2017   Morbid obesity ([place]) 02/16/2017    Past Surgical History:  Procedure Laterality Date   THYROIDECTOMY      Allergies: Penicillins, Codeine, Morphine, and Tramadol  Medications: Prior to Admission medications   Medication Sig Start Date End Date Taking? Authorizing Provider  celecoxib (CELEBREX) 200 MG capsule Take 1 capsule (200 mg total) by mouth 2 (two) times daily. 06/03/22  Yes Ennever, Rudell Cobb, MD  Coenzyme Q10 (COQ10) 100 MG CAPS Take 1 capsule by mouth daily. 04/16/19  Yes [provider]  gabapentin (NEURONTIN) 300 MG capsule Take 1 capsule (300 mg total) by mouth 4 (four) times daily. 06/05/22  Yes Volanda Napoleon, MD  levothyroxine (SYNTHROID) 88 MCG tablet 1 TABLET EVERY MORNING ON AN EMPTY STOMACH ORALLY ONCE A DAY 90 DAYS   Yes [provider]  lisinopril-hydrochlorothiazide (ZESTORETIC) 10-12.5 MG tablet Take 1 tablet by mouth daily. 04/25/22  Yes [provider]  methylPREDNISolone (MEDROL DOSEPAK) 4 MG  TBPK tablet Take as directed. 05/27/22  Yes Ennever, Rudell Cobb, MD  Omega-3 Fatty Acids (FISH OIL) 1000 MG CAPS Take 1 capsule by mouth daily.   Yes [provider]  rosuvastatin (CRESTOR) 10 MG tablet TAKE 1 TABLET BY MOUTH EVERY DAY FOR 90 DAYS   Yes [provider]  Capsicum, Cayenne, (CAYENNE PO) Take 60 mg by mouth daily.    [provider]  fluticasone (FLONASE) 50 MCG/ACT nasal spray 1 SPRAY INTO EACH NOSTRIL EVERY DAY FOR 25 DAYS    [provider]     Family History  Problem Relation Age of Onset   Diabetes Paternal Grandfather    Stroke Paternal Grandfather    Heart attack Paternal Grandfather    Diabetes Father    Hypertension Father    Stroke Father    Lung cancer Father    Hypertension Mother    Kidney disease Mother    Hypertension Sister    Stroke Paternal Grandmother    Heart attack Maternal Grandmother     Social History   Socioeconomic History   Marital status: Widowed    Spouse name: Not on file   Number of children: Not on file   Years of education: Not on file   Highest education level: Not on file  Occupational History   Not on file  Tobacco Use   Smoking status: Former   Smokeless tobacco: Never  Vaping Use   Vaping Use: Never used  Substance and Sexual Activity   Alcohol use: No   Drug use: No   Sexual activity: Not Currently    Birth control/protection: Post-menopausal  Other Topics Concern  Not on file  Social History Narrative   Not on file   Social Determinants of Health   Financial Resource Strain: Not on file  Food Insecurity: Not on file  Transportation Needs: Not on file  Physical Activity: Not on file  Stress: Not on file  Social Connections: Not on file     Review of Systems: A 12 point ROS discussed and pertinent positives are indicated in the HPI above.  All other systems are negative.  Review of Systems  Constitutional:  Negative for chills and fever.  Respiratory:  Negative for chest  tightness and shortness of breath.   Cardiovascular:  Negative for chest pain and leg swelling.  Gastrointestinal:  Negative for diarrhea, nausea and vomiting.  Neurological:  Positive for dizziness. Negative for headaches.  Psychiatric/Behavioral:  Negative for confusion.     Vital Signs: BP 133/70   Pulse (!) 103   Temp 98.3 F (36.8 C) (Oral)   Resp 16   Ht 5' 4.25" (1.632 m)   Wt 185 lb (83.9 kg)   SpO2 100%   BMI 31.51 kg/m     Physical Exam Vitals reviewed.  Constitutional:      General: She is not in acute distress. HENT:     Mouth/Throat:     Mouth: Mucous membranes are moist.  Cardiovascular:     Rate and Rhythm: Regular rhythm. Tachycardia present.     Pulses: Normal pulses.     Heart sounds: Normal heart sounds.  Pulmonary:     Effort: Pulmonary effort is normal.     Breath sounds: Normal breath sounds.  Abdominal:     General: Bowel sounds are normal.     Palpations: Abdomen is soft.     Tenderness: There is no abdominal tenderness.  Musculoskeletal:     Right lower leg: No edema.     Left lower leg: No edema.  Neurological:     Mental Status: She is alert and oriented to person, place, and time.  Psychiatric:        Mood and Affect: Mood is anxious.        Behavior: Behavior normal.     Imaging: MR Lumbar Spine W Wo Contrast  Result Date: 06/06/2022 CLINICAL DATA:  Low back pain. Cauda carina syndrome suspected. Severe pain and right leg radiation. Hematologic malignancy. EXAM: MRI LUMBAR SPINE WITHOUT AND WITH CONTRAST TECHNIQUE: Multiplanar and multiecho pulse sequences of the lumbar spine were obtained without and with intravenous contrast. CONTRAST:  74mL GADAVIST GADOBUTROL 1 MMOL/ML IV SOLN COMPARISON:  MRI lumbar spine 04/27/2022 FINDINGS: Segmentation:  5 lumbar vertebra Alignment:  Normal Vertebrae: Widespread and diffuse bone marrow abnormality consistent with tumor infiltration. This is present throughout the lumbar spine as well as the  sacrum and iliac bones. Bony tumor infiltration has progressed in the interval. Mild fracture of T12 unchanged. There is now extraosseous tumor in the ventral epidural space and to the left of the vertebral body which was not seen previously. Mild fracture of L1 is unchanged.  No epidural tumor Moderate fracture of L3 is unchanged. 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 No epidural tumor Mild compression fracture of L5 is unchanged. Ventral epidural tumor has developed in the interval with borderline spinal stenosis. Multiple enhancing lesions in the spinous processes of T12, L1, L2, L3, L4 best seen on postcontrast images. These are difficult to see on the prior noncontrast CT. Expansile lesion in the left iliac wing has progressed. Conus medullaris and cauda equina:  Conus extends to the L2 level. Conus and cauda equina appear normal. Paraspinal and other soft tissues: Progression of paraspinous tumor on the left at L1 and on the right at L3. Bilateral iliac lymph nodes have enlarged measuring 16 mm on the right and 13 mm on the left Disc levels: T12-L1: Ventral epidural tumor at T12 without cord compression L1-2: Negative L2-3: Negative L3-4: Mild disc and moderate facet degeneration. Negative for stenosis L4-5: Mild degenerative change in the disc and facets without stenosis. L5-S1: Disc and facet degeneration.  Negative for stenosis. IMPRESSION: 1. Widespread tumor infiltration of the bone marrow has progressed in the interval. 2. Interval development of ventral epidural tumor extension at T12 and L5. Progressive paraspinous tumor infiltration lateral to the vertebral body on the right at L3. 3. Expansile lesion left iliac bone has progressed. 4. Iliac lymph nodes are enlarged consistent with tumor involvement and have progressed in the interval. Electronically Signed   By: Franchot Gallo M.D.   On: 06/06/2022 17:33   NM PET Image Initial (PI) Skull Base To Thigh  Result Date:  05/14/2022 CLINICAL DATA:  Initial treatment strategy for hematologic malignancy. EXAM: NUCLEAR MEDICINE PET SKULL BASE TO THIGH TECHNIQUE: 10.7 mCi F-18 FDG was injected intravenously. Full-ring PET imaging was performed from the skull base to thigh after the radiotracer. CT data was obtained and used for attenuation correction and anatomic localization. Fasting blood glucose: 99 mg/dl COMPARISON:  MR thoracic and lumbar spine 04/27/2022. FINDINGS: Mediastinal blood pool activity: SUV max 2.9 Liver activity: SUV max 4.4 NECK: No hypermetabolic lymph nodes. Incidental CT findings: None. CHEST: Right supraclavicular lymph node measures 1.4 cm (4/45) SUV max 19.7. Hypermetabolic bilateral internal mammary adenopathy, index 6 mm lymph node on the right (4/61), SUV 15.3. Hypermetabolic right axillary node measures 9 mm (4/62), SUV max 17.0. No hypermetabolic mediastinal hilar or left axillary lymph nodes. No hypermetabolic nodules. Incidental CT findings: Coronary artery calcification. Heart is at the upper limits of normal in size. No pericardial effusion. Moderate to large right pleural effusion. ABDOMEN/PELVIS: No abnormal hypermetabolism in the liver, adrenal glands, spleen or pancreas. Small hypermetabolic left internal iliac lymph nodes measure up to 4 mm (4/151), SUV max 11.7. Incidental CT findings: Liver, gallbladder, adrenal glands, kidneys, spleen, pancreas, stomach and bowel are grossly unremarkable. SKELETON: Numerous hypermetabolic lesions throughout the visualized osseous structures are seen from the skull to the proximal femora, many of which have associated soft tissue masses. Index lytic mass in the left iliac wing measures 5.7 x 5.8 cm (4/141), SUV 27.6. Vertebral body fractures, better seen and evaluated on corresponding thoracolumbar spine MR. Incidental CT findings: Degenerative changes in the spine. IMPRESSION: 1. Widespread osseous hypermetabolism with hypermetabolic lymph nodes in the chest and  pelvis, findings favoring multiple myeloma. A visceral primary is not identified. 2. Moderate to large right pleural effusion. 3. Coronary artery calcification. Electronically Signed   By: Lorin Picket M.D.   On: 05/14/2022 14:19    Labs:  CBC: Recent Labs    05/02/22 1054 06/07/22 0748  WBC 7.9 8.8  HGB 9.5* 9.1*  HCT 30.3* 28.2*  PLT 153 175    COAGS: No results for input(s): "INR", "APTT" in the last 8760 hours.  BMP: Recent Labs    05/02/22 1054  NA 138  K 3.8  CL 99  CO2 31  GLUCOSE 112*  BUN 13  CALCIUM 12.0*  CREATININE 1.01*  GFRNONAA >60    LIVER FUNCTION TESTS: Recent Labs  05/02/22 1054  BILITOT 0.4  AST 28  ALT 22  ALKPHOS 86  PROT 8.6*  ALBUMIN 4.5    TUMOR MARKERS: No results for input(s): "AFPTM", "CEA", "CA199", "CHROMGRNA" in the last 8760 hours.  Assessment and Plan:  Michelle [name] is a 61 yo female with PMH of hypertension, hyperlipidemia, obesity, and hypothyroidism being seen today for monoclonal gammopathy of unknown significance. She was referred to IR for CT-guided bone marrow biopsy with sedation due to inability to tolerate bone marrow biopsy with local anesthetic only. She is anxious to get the procedure done today. Patient is to undergo procedure today with Dr Anselm Pancoast.  Risks and benefits of CT-guided bone marrow biopsy was discussed with the patient including, but not limited to bleeding, infection, damage to adjacent structures or low yield requiring additional tests.  All of the questions were answered and there is agreement to proceed.  Consent signed and in chart.   Thank you for this interesting consult.  I greatly enjoyed meeting SHONICA WEIER and look forward to participating in their care.  A copy of this report was sent to the requesting provider on this date.  Electronically Signed: Lura Em, PA-C 06/07/2022, 8:25 AM   I spent a total of  15 Minutes   in face to face in clinical consultation, greater than 50%  of which was counseling/coordinating care for monoclonal gammopathy of unknown significance.

## 2022-06-07 NOTE — Procedures (Signed)
Interventional Radiology Procedure:   Indications: Anemia with lytic bone lesions, concern for myeloma  Procedure: CT guided bone marrow biopsy  Findings: 2 aspirates and 2 cores from left ilium  Complications: None     EBL: Minimal, less than 10 ml  Plan: Discharge to home in one hour.   Michelle [name] R. Anselm Pancoast, MD  Pager: [phone]

## 2022-06-10 DIAGNOSIS — C7951 Secondary malignant neoplasm of bone: Secondary | ICD-10-CM | POA: Insufficient documentation

## 2022-06-10 NOTE — Progress Notes (Signed)
Histology and Location of Primary Cancer: Myeloma  Sites of Visceral and Bony Metastatic Disease: Lumbar spine  Location(s) of Symptomatic Metastases: Lumbar Spine  06/06/2022 Dr. Marin Olp MR Lumbar Spine with /without Contrast CLINICAL DATA:  Low back pain. Cauda carina syndrome suspected.  Severe pain and right leg radiation. Hematologic malignancy.  FINDINGS: Segmentation:  5 lumbar vertebra Alignment:  Normal   Vertebrae: Widespread and diffuse bone marrow abnormality consistent with tumor infiltration. This is present throughout the lumbar spine as well as the sacrum and iliac bones. Bony tumor infiltration has progressed in the interval.   Mild fracture of T12 unchanged. There is now extraosseous tumor in the ventral epidural space and to the left of the vertebral body which was not seen previously.   Mild fracture of L1 is unchanged.  No epidural tumor   Moderate fracture of L3 is unchanged. 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 No epidural tumor   Mild compression fracture of L5 is unchanged. Ventral epidural tumor has developed in the interval with borderline spinal stenosis.   Multiple enhancing lesions in the spinous processes of T12, L1, L2, L3, L4 best seen on postcontrast images. These are difficult to see on the prior noncontrast CT.   Expansile lesion in the left iliac wing has progressed.   Conus medullaris and cauda equina: Conus extends to the L2 level.  Conus and cauda equina appear normal.   Paraspinal and other soft tissues: Progression of paraspinous tumor on the left at L1 and on the right at L3.   Bilateral iliac lymph nodes have enlarged measuring 16 mm on the right and 13 mm on the left   Disc levels: T12-L1: Ventral epidural tumor at T12 without cord compression.   L1-2: Negative L2-3: Negative L3-4: Mild disc and moderate facet degeneration. Negative for stenosis L4-5: Mild degenerative change in the disc and facets without  stenosis. L5-S1: Disc and facet degeneration.  Negative for stenosis.   IMPRESSION: 1. Widespread tumor infiltration of the bone marrow has progressed in the interval. 2. Interval development of ventral epidural tumor extension at T12 and L5. Progressive paraspinous tumor infiltration lateral to the vertebral body on the right at L3. 3. Expansile lesion left iliac bone has progressed. 4. Iliac lymph nodes are enlarged consistent with tumor involvement and have progressed in the interval.   7/10/202 Dr. Marin Olp NM PET Image Initial Skull Base To Thigh CLINICAL DATA:  Initial treatment strategy for hematologic malignancy.  FINDINGS: Mediastinal blood pool activity: SUV max 2.9 Liver activity: SUV max 4.4   NECK: No hypermetabolic lymph nodes.   Incidental CT findings:   CHEST: Right supraclavicular lymph node measures 1.4 cm (4/45) SUV max 19.7. Hypermetabolic bilateral internal mammary adenopathy, index 6 mm lymph node on the right (4/61), SUV 15.3. Hypermetabolic right axillary node measures 9 mm (4/62), SUV max 17.0. No hypermetabolic mediastinal hilar or left axillary lymph nodes. No hypermetabolic nodules.   Incidental CT findings:   Coronary artery calcification. Heart is at the upper limits of normal in size. No pericardial effusion. Moderate to large right pleural effusion.   ABDOMEN/PELVIS: No abnormal hypermetabolism in the liver, adrenal glands, spleen or pancreas. Small hypermetabolic left internal iliac lymph nodes measure up to 4 mm (4/151), SUV max 11.7.   Incidental CT findings:   Liver, gallbladder, adrenal glands, kidneys, spleen, pancreas, stomach and bowel are grossly unremarkable.   SKELETON: Numerous hypermetabolic lesions throughout the visualized osseous structures are seen from the skull to the proximal femora,  many of which have associated soft tissue masses. Index lytic mass in the left iliac wing measures 5.7 x 5.8 cm (4/141), SUV 27.6. Vertebral body  fractures, better seen and evaluated on corresponding thoracolumbar spine MR.   Incidental CT findings:   Degenerative changes in the spine.   IMPRESSION: 1. Widespread osseous hypermetabolism with hypermetabolic lymph nodes in the chest and pelvis, findings favoring multiple myeloma. A visceral primary is not identified. 2. Moderate to large right pleural effusion. 3. Coronary artery calcification.   04/27/2022 Dr. Lynann Bologna MR Lumbar Spine without Contrast CLINICAL DATA:  Low back pain.  Compression fracture.  FINDINGS: MRI THORACIC SPINE FINDINGS   Alignment:  Normal.   Vertebrae: Widespread bone marrow signal abnormality with numerous T1 hypointense, STIR hyperintense lesions throughout the thoracic spine involving both the vertebral bodies and posterior elements.  Multiple rib lesions are also partially visualized, including a 2.3 cm expansile lesion of the posteromedial right ninth rib and an incompletely imaged, larger expansile lesion of the posterior right ninth rib more laterally measuring at least 3.4 cm in size. No definite evidence of epidural tumor on this unenhanced study. T12 superior endplate compression fracture with 20% vertebral body height loss and T10 and T11 compression fractures with 10% height loss. Extensive infiltrative marrow abnormality throughout these vertebral bodies limits assessment of marrow edema for characterization of fracture acuity.   Cord:  Normal signal and morphology.   Paraspinal and other soft tissues: Small right pleural effusion.   Disc levels: Mild thoracic spondylosis and facet arthrosis including a small right paracentral disc protrusion at T7-8. No significant stenosis.   MRI LUMBAR SPINE FINDINGS   Segmentation:  Standard.   Alignment:  Normal.   Vertebrae: Numerous T1 hypointense, STIR hyperintense lesions throughout the lumbar spine and included pelvis with question of right and possibly left-sided sacral ala fractures,  incompletely imaged. L1 superior endplate compression fracture with 10% vertebral body height loss. L5 superior endplate compression fracture with 15% vertebral body height loss. L3 compression fracture involving the superior and inferior endplates with 44% height loss and bulging of the anterior vertebral body. Limited assessment for marrow edema associated with these fractures due to widespread infiltrative marrow lesions. No evidence of epidural tumor on this unenhanced study.   Conus medullaris and cauda equina: Conus extends to the L1-2 level.  Conus and cauda equina appear normal.   Paraspinal and other soft tissues: Unremarkable.   Disc levels: Mild lumbar spondylosis and up to moderate facet arthrosis without spinal stenosis or compressive neural foraminal stenosis.   IMPRESSION: 1. Widespread lesions throughout the spine, included pelvis, and ribs consistent with metastatic disease. No evidence of epidural tumor. 2. Compression fractures at T10, T11, T12, L1, L3, and L5. Vertebral body height loss is maximal at L3 (30%). 3. Questionable sacral fractures. 4. Small right pleural effusion.   Past/Anticipated chemotherapy by medical oncology, if any:   06/07/2022 Dr. Marin Olp I talked to her about her MRI report.  She has significant cancer/myeloma now in the lumbar spine. She has some extradural disease.  She has some compression on nerve root.  She does not have any obvious fracture.  I have already spoken with Dr. Tammi Klippel of radiation Oncology.  He will get her in early next week to see about radiation therapy.  In talking with Ms. Clouatre I told her that we need to get her some steroids to help with inflammation.  I started her on Decadron 8 mg p.o. twice daily.  She is to  take this with food.  I will send in some Diflucan to help prevent her from having thrush.  She clearly needs to have some long-acting pain medicine.  I will send in a Duragesic patch 12.5 mcg.  She does have quite a few  allergies.  Hopefully, she will tolerate this.  For short-term pain, we will try her on hydromorphone 2 mg p.o. every 6 hours as needed.  She understands all this.  Hopefully, we will have the bone marrow report back next week.  She had a bone marrow test done today.   Pain on a scale of 0-10 is:  6/10, lower back, bottom and radiates to left leg.  If Spine Met(s), symptoms, if any, include: Bowel/Bladder retention or incontinence (please describe): Constipation, bladder is good. Numbness or weakness in extremities (please describe): Weakless in lower extremities and tingling in toes which makes if difficult to walk at times. Current Decadron regimen, if applicable: Decadron 8 mg po twice daily.  Ambulatory status? Walker? Wheelchair?: Wheelchair  SAFETY ISSUES: Prior radiation? No Pacemaker/ICD? No Possible current pregnancy? No Is the patient on methotrexate? No  Current Complaints / other details:

## 2022-06-10 NOTE — Progress Notes (Signed)
  Radiation Oncology         [phone] ________________________________  Name: [name] MRN: [id number]  Date: 06/11/2022  DOB: 10-01-1961  SIMULATION AND TREATMENT PLANNING NOTE    ICD-10-CM   1. Multiple myeloma not having achieved remission ([place])  C90.00       DIAGNOSIS:  61 y.o. patient with pain from lumbar involvement of multiple myeloma  NARRATIVE:  The patient was brought to the [facility].  Identity was confirmed.  All relevant records and images related to the planned course of therapy were reviewed.  The patient freely provided informed written consent to proceed with treatment after reviewing the details related to the planned course of therapy. The consent form was witnessed and verified by the simulation staff.  Then, the patient was set-up in a stable reproducible  supine position for radiation therapy.  CT images were obtained.  Surface markings were placed.  The CT images were loaded into the planning software.  Then the target and avoidance structures were contoured including kidneys.  Treatment planning then occurred.  The radiation prescription was entered and confirmed.  Then, I designed and supervised the construction of a total of 3 medically necessary complex treatment devices with VacLoc positioner and 2 MLCs to shield kidneys.  I have requested : 3D Simulation  I have requested a DVH of the following structures: Left Kidney, Right Kidney and target.  PLAN:  The patient will receive 20 Gy in 10 fractions.  ________________________________  Sheral Apley Tammi Klippel, M.D.

## 2022-06-10 NOTE — Progress Notes (Signed)
Radiation Oncology         [phone] ________________________________  Initial outpatient Consultation  Name: Michelle [name] MRN: [id number]  Date of Service: 06/11/2022 DOB: September 04, 1961  OP:[id number], Ronie Spies, MD  Volanda Napoleon, MD   REFERRING PHYSICIAN: Volanda Napoleon, MD  DIAGNOSIS: There were no encounter diagnoses.  No diagnosis found.  HISTORY OF PRESENT ILLNESS: Michelle [name] is a 61 y.o. female seen at the request of Dr. Marin Olp. She initially presented to her PCP with worsening back pain. She was referred to Dr. Lynann Bologna in orthopedic surgery, who ordered MRI. She proceeded to thoracic and lumbar spine MRI w/o contrast on 04/27/22 showing: widespread lesions throughout spine, included pelvis, and ribs consistent with metastatic disease; no evidence of epidural tumor; compression fractures at T10-L1, L3, and L5, with maximum height loss of 30% at L3; questionable sacral fractures. She was referred to Dr. Marin Olp on 05/02/22, who ordered baseline labs, including light chain levels and tumor markers. She was found to have elevated IgG, kappa light chain, and M-spike levels, as well as elevated CA 27.29.  She underwent PET scan on 05/13/22 for further work up showing: widespread osseous hypermetabolism with hypermetabolic nodes in chest and pelvis, findings favoring multiple myeloma; a visceral primary is not identified; moderate to large right pleural effusion. She also underwent lumbar spine MRI with contrast on 06/06/22 showing: widespread tumor infiltration of bone marrow, progressed in interval; interval development of ventral epidural tumor extension at T12 and L5; progressive paraspinous tumor infiltration lateral to vertebral body on right at L3; progression of expansile left iliac bone lesion; enlarged iliac lymph nodes, progressed in the interval.  Of note, she attempted to undergo in-office bone marrow biopsy on 05/17/22 but was unable to tolerate the procedure. She proceeded to  biopsy under anesthesia on 06/07/22. Pathology results from the procedure are pending.  PREVIOUS RADIATION THERAPY: No  PAST MEDICAL HISTORY:  Past Medical History:  Diagnosis Date   Essential hypertension 02/16/2017   Hyperlipemia    Hyperlipidemia 02/16/2017   Hypertension    Hypothyroidism 02/16/2017   Morbid obesity ([facility]) 02/16/2017      PAST SURGICAL HISTORY: Past Surgical History:  Procedure Laterality Date   THYROIDECTOMY      FAMILY HISTORY:  Family History  Problem Relation Age of Onset   Diabetes Paternal Grandfather    Stroke Paternal Grandfather    Heart attack Paternal Grandfather    Diabetes Father    Hypertension Father    Stroke Father    Lung cancer Father    Hypertension Mother    Kidney disease Mother    Hypertension Sister    Stroke Paternal Grandmother    Heart attack Maternal Grandmother     SOCIAL HISTORY:  Social History   Socioeconomic History   Marital status: Widowed    Spouse name: Not on file   Number of children: Not on file   Years of education: Not on file   Highest education level: Not on file  Occupational History   Not on file  Tobacco Use   Smoking status: Former   Smokeless tobacco: Never  Vaping Use   Vaping Use: Never used  Substance and Sexual Activity   Alcohol use: No   Drug use: No   Sexual activity: Not Currently    Birth control/protection: Post-menopausal  Other Topics Concern   Not on file  Social History Narrative   Not on file   Social Determinants of Health   Financial Resource Strain:  Not on file  Food Insecurity: Not on file  Transportation Needs: Not on file  Physical Activity: Not on file  Stress: Not on file  Social Connections: Not on file  Intimate Partner Violence: Not on file    ALLERGIES: Penicillins, Codeine, Morphine, and Tramadol  MEDICATIONS:  Current Outpatient Medications  Medication Sig Dispense Refill   Capsicum, Cayenne, (CAYENNE PO) Take 60 mg by mouth daily.     Coenzyme  Q10 (COQ10) 100 MG CAPS Take 1 capsule by mouth daily.     dexamethasone (DECADRON) 4 MG tablet Take 2 tablets (8 mg total) by mouth 2 (two) times daily. Please make sure you take this with food. 120 tablet 0   fentaNYL (DURAGESIC) 12 MCG/HR Place 1 patch onto the skin every 3 (three) days. 10 patch 0   fluconazole (DIFLUCAN) 100 MG tablet Take 1 tablet (100 mg total) by mouth daily. 30 tablet 4   fluticasone (FLONASE) 50 MCG/ACT nasal spray 1 SPRAY INTO EACH NOSTRIL EVERY DAY FOR 30 DAYS     gabapentin (NEURONTIN) 300 MG capsule Take 1 capsule (300 mg total) by mouth 4 (four) times daily. 120 capsule 0   HYDROmorphone (DILAUDID) 2 MG tablet Take 1 tablet (2 mg total) by mouth every 6 (six) hours as needed for severe pain. 60 tablet 0   levothyroxine (SYNTHROID) 88 MCG tablet 1 TABLET EVERY MORNING ON AN EMPTY STOMACH ORALLY ONCE A DAY 90 DAYS     lisinopril-hydrochlorothiazide (ZESTORETIC) 10-12.5 MG tablet Take 1 tablet by mouth daily.     Omega-3 Fatty Acids (FISH OIL) 1000 MG CAPS Take 1 capsule by mouth daily.     rosuvastatin (CRESTOR) 10 MG tablet TAKE 1 TABLET BY MOUTH EVERY DAY FOR 90 DAYS     No current facility-administered medications for this encounter.    REVIEW OF SYSTEMS:  On review of systems, the patient reports that she is doing well overall. She denies any chest pain, shortness of breath, cough, fevers, chills, night sweats, unintended weight changes. She denies any bowel or bladder disturbances, and denies abdominal pain, nausea or vomiting. She reports ***. A complete review of systems is obtained and is otherwise negative.    PHYSICAL EXAM:  Wt Readings from Last 3 Encounters:  06/07/22 185 lb (83.9 kg)  05/02/22 215 lb 6.4 oz (97.7 kg)  02/13/17 246 lb 9.6 oz (111.9 kg)   Temp Readings from Last 3 Encounters:  06/07/22 98.1 F (36.7 C) (Oral)  05/17/22 98.5 F (36.9 C) (Oral)  05/02/22 98.1 F (36.7 C) (Oral)   BP Readings from Last 3 Encounters:  06/07/22  131/76  05/17/22 117/70  05/02/22 131/75   Pulse Readings from Last 3 Encounters:  06/07/22 (!) 106  05/17/22 (!) 107  05/02/22 (!) 115    /10  In general this is a well appearing *** woman in no acute distress. She's alert and oriented x4 and appropriate throughout the examination. Cardiopulmonary assessment is negative for acute distress and she exhibits normal effort.     KPS = ***  100 - Normal; no complaints; no evidence of disease. 90   - Able to carry on normal activity; minor signs or symptoms of disease. 80   - Normal activity with effort; some signs or symptoms of disease. 82   - Cares for self; unable to carry on normal activity or to do active work. 60   - Requires occasional assistance, but is able to care for most of his personal needs. 50   -  Requires considerable assistance and frequent medical care. 3   - Disabled; requires special care and assistance. 67   - Severely disabled; hospital admission is indicated although death not imminent. 86   - Very sick; hospital admission necessary; active supportive treatment necessary. 10   - Moribund; fatal processes progressing rapidly. 0     - Dead  Karnofsky DA, Abelmann [place], Craver LS and Burchenal JH [phone]) The use of the nitrogen mustards in the palliative treatment of carcinoma: with particular reference to bronchogenic carcinoma Cancer 1 634-56  LABORATORY DATA:  Lab Results  Component Value Date   WBC 8.8 06/07/2022   HGB 9.1 (L) 06/07/2022   HCT 28.2 (L) 06/07/2022   MCV 82.2 06/07/2022   PLT 175 06/07/2022   Lab Results  Component Value Date   NA 138 05/02/2022   K 3.8 05/02/2022   CL 99 05/02/2022   CO2 31 05/02/2022   Lab Results  Component Value Date   ALT 22 05/02/2022   AST 28 05/02/2022   ALKPHOS 86 05/02/2022   BILITOT 0.4 05/02/2022     RADIOGRAPHY: CT BONE MARROW BIOPSY & ASPIRATION  Result Date: 06/07/2022 INDICATION: 61 year old with numerous lytic bone lesions and concern for myeloma.  Request for bone marrow biopsy. EXAM: CT GUIDED BONE MARROW ASPIRATES AND BIOPSY Physician: Stephan Minister. Henn, MD MEDICATIONS: Moderate sedation ANESTHESIA/SEDATION: Moderate (conscious) sedation was employed during this procedure. A total of Versed 2.59m and fentanyl 100 mcg and 25 mg Benadryl was administered intravenously at the order of the provider performing the procedure. Total intra-service moderate sedation time: 19 minutes. Patient's level of consciousness and vital signs were monitored continuously by radiology nurse throughout the procedure under the supervision of the provider performing the procedure. COMPLICATIONS: None immediate. PROCEDURE: The procedure was explained to the patient. The risks and benefits of the procedure were discussed and the patient's questions were addressed. Informed consent was obtained from the patient. The patient was placed prone on CT table. Images of the pelvis were obtained. The left side of back was prepped and draped in sterile fashion. The skin and left posterior ilium were anesthetized with 1% lidocaine. 11 gauge bone needle was directed into the left ilium with CT guidance. Two aspirates and two core biopsies were obtained. Bandage placed over the puncture site. RADIATION DOSE REDUCTION: This exam was performed according to the departmental dose-optimization program which includes automated exposure control, adjustment of the mA and/or kV according to patient size and/or use of iterative reconstruction technique. FINDINGS: Large destructive lytic lesion involving the left ilium. Scattered lytic lesions throughout the right ilium. Biopsy was obtained from the posterior left ilium in order to avoid the lytic lesions. Biopsy needle was directed in the posterior left ilium. Initially it was difficult to get any aspirate material. Eventually some aspirate material was obtained. Two core biopsies were obtained. IMPRESSION: CT guided bone marrow aspiration and core biopsy.  Electronically Signed   By: AMarkus DaftM.D.   On: 06/07/2022 13:39   MR Lumbar Spine W Wo Contrast  Result Date: 06/06/2022 CLINICAL DATA:  Low back pain. Cauda carina syndrome suspected. Severe pain and right leg radiation. Hematologic malignancy. EXAM: MRI LUMBAR SPINE WITHOUT AND WITH CONTRAST TECHNIQUE: Multiplanar and multiecho pulse sequences of the lumbar spine were obtained without and with intravenous contrast. CONTRAST:  156mGADAVIST GADOBUTROL 1 MMOL/ML IV SOLN COMPARISON:  MRI lumbar spine 04/27/2022 FINDINGS: Segmentation:  5 lumbar vertebra Alignment:  Normal Vertebrae: Widespread and diffuse bone marrow abnormality  consistent with tumor infiltration. This is present throughout the lumbar spine as well as the sacrum and iliac bones. Bony tumor infiltration has progressed in the interval. Mild fracture of T12 unchanged. There is now extraosseous tumor in the ventral epidural space and to the left of the vertebral body which was not seen previously. Mild fracture of L1 is unchanged.  No epidural tumor Moderate fracture of L3 is unchanged. 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 No epidural tumor Mild compression fracture of L5 is unchanged. Ventral epidural tumor has developed in the interval with borderline spinal stenosis. Multiple enhancing lesions in the spinous processes of T12, L1, L2, L3, L4 best seen on postcontrast images. These are difficult to see on the prior noncontrast CT. Expansile lesion in the left iliac wing has progressed. Conus medullaris and cauda equina: Conus extends to the L2 level. Conus and cauda equina appear normal. Paraspinal and other soft tissues: Progression of paraspinous tumor on the left at L1 and on the right at L3. Bilateral iliac lymph nodes have enlarged measuring 16 mm on the right and 13 mm on the left Disc levels: T12-L1: Ventral epidural tumor at T12 without cord compression L1-2: Negative L2-3: Negative L3-4: Mild disc and moderate facet  degeneration. Negative for stenosis L4-5: Mild degenerative change in the disc and facets without stenosis. L5-S1: Disc and facet degeneration.  Negative for stenosis. IMPRESSION: 1. Widespread tumor infiltration of the bone marrow has progressed in the interval. 2. Interval development of ventral epidural tumor extension at T12 and L5. Progressive paraspinous tumor infiltration lateral to the vertebral body on the right at L3. 3. Expansile lesion left iliac bone has progressed. 4. Iliac lymph nodes are enlarged consistent with tumor involvement and have progressed in the interval. Electronically Signed   By: Franchot Gallo M.D.   On: 06/06/2022 17:33   NM PET Image Initial (PI) Skull Base To Thigh  Result Date: 05/14/2022 CLINICAL DATA:  Initial treatment strategy for hematologic malignancy. EXAM: NUCLEAR MEDICINE PET SKULL BASE TO THIGH TECHNIQUE: 10.7 mCi F-18 FDG was injected intravenously. Full-ring PET imaging was performed from the skull base to thigh after the radiotracer. CT data was obtained and used for attenuation correction and anatomic localization. Fasting blood glucose: 99 mg/dl COMPARISON:  MR thoracic and lumbar spine 04/27/2022. FINDINGS: Mediastinal blood pool activity: SUV max 2.9 Liver activity: SUV max 4.4 NECK: No hypermetabolic lymph nodes. Incidental CT findings: None. CHEST: Right supraclavicular lymph node measures 1.4 cm (4/45) SUV max 19.7. Hypermetabolic bilateral internal mammary adenopathy, index 6 mm lymph node on the right (4/61), SUV 15.3. Hypermetabolic right axillary node measures 9 mm (4/62), SUV max 17.0. No hypermetabolic mediastinal hilar or left axillary lymph nodes. No hypermetabolic nodules. Incidental CT findings: Coronary artery calcification. Heart is at the upper limits of normal in size. No pericardial effusion. Moderate to large right pleural effusion. ABDOMEN/PELVIS: No abnormal hypermetabolism in the liver, adrenal glands, spleen or pancreas. Small  hypermetabolic left internal iliac lymph nodes measure up to 4 mm (4/151), SUV max 11.7. Incidental CT findings: Liver, gallbladder, adrenal glands, kidneys, spleen, pancreas, stomach and bowel are grossly unremarkable. SKELETON: Numerous hypermetabolic lesions throughout the visualized osseous structures are seen from the skull to the proximal femora, many of which have associated soft tissue masses. Index lytic mass in the left iliac wing measures 5.7 x 5.8 cm (4/141), SUV 27.6. Vertebral body fractures, better seen and evaluated on corresponding thoracolumbar spine MR. Incidental CT findings: Degenerative changes in the  spine. IMPRESSION: 1. Widespread osseous hypermetabolism with hypermetabolic lymph nodes in the chest and pelvis, findings favoring multiple myeloma. A visceral primary is not identified. 2. Moderate to large right pleural effusion. 3. Coronary artery calcification. Electronically Signed   By: Lorin Picket M.D.   On: 05/14/2022 14:19      IMPRESSION/PLAN: 1. 61 y.o. woman with painful thoracic and lumbar osseous lesions, suspicious for multiple myeloma***  Today, we talked to the patient and family about the findings and workup thus far. We discussed the natural history of multiple myeloma and general treatment, highlighting the role of radiotherapy in the management of painful osseous metastasis. We discussed the available radiation techniques, and focused on the details and logistics of delivery. We reviewed the anticipated acute and late sequelae associated with radiation in this setting. The patient was encouraged to ask questions that were answered to his/her satisfaction.  At the end of our conversation, the patient ***   I personally spent *** minutes in this encounter including chart review, reviewing radiological studies, meeting face-to-face with the patient, entering orders and completing documentation.    Nicholos Johns, PA-C    Tyler Pita, MD  [facility] Direct Dial: [phone]  Fax: [phone] [email or web address]  Skype  [email or web address]    This document serves as a record of services personally performed by Tyler Pita, MD and Freeman Caldron, PA-C. It was created on their behalf by Wilburn Mylar, a trained medical scribe. The creation of this record is based on the scribe's personal observations and the provider's statements to them. This document has been checked and approved by the attending provider.

## 2022-06-11 ENCOUNTER — Other Ambulatory Visit: Payer: Self-pay

## 2022-06-11 ENCOUNTER — Ambulatory Visit
Admission: RE | Admit: 2022-06-11 | Discharge: 2022-06-11 | Disposition: A | Discharge status: Home / Self Care | Payer: BC Managed Care – PPO | Source: Ambulatory Visit | Attending: Radiation Oncology | Admitting: Radiation Oncology

## 2022-06-11 VITALS — BP 111/70 | HR 123 | Temp 98.6°F | Resp 18 | Ht 64.25 in | Wt 202.0 lb

## 2022-06-11 DIAGNOSIS — C9000 Multiple myeloma not having achieved remission: Secondary | ICD-10-CM

## 2022-06-11 DIAGNOSIS — J90 Pleural effusion, not elsewhere classified: Secondary | ICD-10-CM | POA: Insufficient documentation

## 2022-06-11 DIAGNOSIS — I10 Essential (primary) hypertension: Secondary | ICD-10-CM | POA: Insufficient documentation

## 2022-06-11 DIAGNOSIS — R202 Paresthesia of skin: Secondary | ICD-10-CM | POA: Insufficient documentation

## 2022-06-11 DIAGNOSIS — Z87891 Personal history of nicotine dependence: Secondary | ICD-10-CM | POA: Insufficient documentation

## 2022-06-11 DIAGNOSIS — Z79899 Other long term (current) drug therapy: Secondary | ICD-10-CM | POA: Insufficient documentation

## 2022-06-11 DIAGNOSIS — Z7952 Long term (current) use of systemic steroids: Secondary | ICD-10-CM | POA: Insufficient documentation

## 2022-06-11 DIAGNOSIS — C7951 Secondary malignant neoplasm of bone: Secondary | ICD-10-CM

## 2022-06-11 DIAGNOSIS — E785 Hyperlipidemia, unspecified: Secondary | ICD-10-CM | POA: Insufficient documentation

## 2022-06-11 DIAGNOSIS — R768 Other specified abnormal immunological findings in serum: Secondary | ICD-10-CM | POA: Insufficient documentation

## 2022-06-11 DIAGNOSIS — Z79890 Hormone replacement therapy (postmenopausal): Secondary | ICD-10-CM | POA: Insufficient documentation

## 2022-06-11 DIAGNOSIS — E039 Hypothyroidism, unspecified: Secondary | ICD-10-CM | POA: Insufficient documentation

## 2022-06-11 LAB — SURGICAL PATHOLOGY

## 2022-06-12 ENCOUNTER — Encounter: Payer: Self-pay | Admitting: *Deleted

## 2022-06-12 ENCOUNTER — Other Ambulatory Visit: Payer: Self-pay | Admitting: *Deleted

## 2022-06-12 ENCOUNTER — Ambulatory Visit (HOSPITAL_COMMUNITY): Admission: RE | Admit: 2022-06-12 | Payer: BC Managed Care – PPO | Source: Ambulatory Visit

## 2022-06-12 ENCOUNTER — Encounter: Payer: Self-pay | Admitting: Surgery

## 2022-06-12 MED ORDER — FENTANYL 12 MCG/HR TD PT72: 1.0000 | MEDICATED_PATCH | TRANSDERMAL | 0 refills | Status: DC

## 2022-06-12 NOTE — Progress Notes (Signed)
PA for Dilaudid '2mg'$  tablets was approved by [employer] from 06/11/22 through 06/12/23.  I called the pt's pharmacy to verify this approval.

## 2022-06-12 NOTE — Progress Notes (Signed)
Reviewed bone marrow pathology with Dr Marin Olp. FISH and cytogenetics are pending.   He would like to see patient next week. Appointment made with patient and she is aware of date, time and location.   Followed up on her pain medication. She was able to fill the dilaudid and has been taking as prescribed. She states it helps her pain "some what". Her patches are still pending. Reached out to the PA team.   Patches approved. Patient is aware. Explained that the patches won't start to work until about 24h after she starts. She is encouraged to continue the po dilaudid for pain control until fentanyl becomes therapeutic. She expresses understanding.   Original pharmacy doesn't have medication in stock. Had to be transferred to [facility] on [address].   Oncology Nurse Navigator Documentation     06/12/2022    8:30 AM  Oncology Nurse Navigator Flowsheets  Confirmed Diagnosis Date 06/07/2022  Diagnosis Status Pending Molecular Studies  Navigator Follow Up Date: 06/18/2022  Navigator Follow Up Reason: Follow-up After Biopsy  Navigator Location [facility]  Navigator Encounter Type Pathology Review;Telephone  Telephone Appt Confirmation/Clarification;Education;Outgoing Call  Patient Visit Type MedOnc  Treatment Phase Pre-Tx/Tx Discussion  Barriers/Navigation Needs Coordination of Care;Education  Education Pain/ Symptom Management;Other  Interventions Coordination of Care;Education;Psycho-Social Support  Acuity Level 2-Minimal Needs (1-2 Barriers Identified)  Coordination of Care Appts  Education Method Verbal;Teach-back  Support Groups/Services Friends and Family  Time Spent with Patient 85

## 2022-06-12 NOTE — Progress Notes (Addendum)
Approval received for Duragesic Patch 12 mcg.hr via [facility] [employer] - Confirmation number [id number].  Patient aware.

## 2022-06-13 ENCOUNTER — Other Ambulatory Visit: Payer: Self-pay

## 2022-06-13 ENCOUNTER — Ambulatory Visit
Admission: RE | Admit: 2022-06-13 | Discharge: 2022-06-13 | Disposition: A | Discharge status: Home / Self Care | Payer: BC Managed Care – PPO | Source: Ambulatory Visit | Attending: Radiation Oncology | Admitting: Radiation Oncology

## 2022-06-13 LAB — RAD ONC ARIA SESSION SUMMARY
Course Elapsed Days: 0
Plan Fractions Treated to Date: 1
Plan Prescribed Dose Per Fraction: 2 Gy
Plan Total Fractions Prescribed: 10
Plan Total Prescribed Dose: 20 Gy
Reference Point Dosage Given to Date: 2 Gy
Reference Point Session Dosage Given: 2 Gy
Session Number: 1

## 2022-06-14 ENCOUNTER — Ambulatory Visit: Admission: RE | Admit: 2022-06-14 | Payer: BC Managed Care – PPO | Source: Ambulatory Visit

## 2022-06-14 ENCOUNTER — Other Ambulatory Visit: Payer: Self-pay

## 2022-06-14 ENCOUNTER — Encounter (HOSPITAL_COMMUNITY): Payer: Self-pay

## 2022-06-14 ENCOUNTER — Ambulatory Visit
Admission: RE | Admit: 2022-06-14 | Discharge: 2022-06-14 | Disposition: A | Discharge status: Home / Self Care | Payer: BC Managed Care – PPO | Source: Ambulatory Visit | Attending: Radiation Oncology | Admitting: Radiation Oncology

## 2022-06-14 ENCOUNTER — Inpatient Hospital Stay (HOSPITAL_COMMUNITY)
Admission: EM | Admit: 2022-06-14 | Discharge: 2022-06-26 | DRG: 824 | Disposition: A | Discharge status: Home Health | Payer: BC Managed Care – PPO | Source: Ambulatory Visit | Attending: Internal Medicine | Admitting: Internal Medicine

## 2022-06-14 ENCOUNTER — Encounter: Payer: Self-pay | Admitting: *Deleted

## 2022-06-14 ENCOUNTER — Other Ambulatory Visit: Payer: Self-pay | Admitting: Radiation Oncology

## 2022-06-14 DIAGNOSIS — K219 Gastro-esophageal reflux disease without esophagitis: Secondary | ICD-10-CM | POA: Diagnosis present

## 2022-06-14 DIAGNOSIS — D72829 Elevated white blood cell count, unspecified: Secondary | ICD-10-CM | POA: Diagnosis present

## 2022-06-14 DIAGNOSIS — Z87891 Personal history of nicotine dependence: Secondary | ICD-10-CM | POA: Diagnosis not present

## 2022-06-14 DIAGNOSIS — D630 Anemia in neoplastic disease: Secondary | ICD-10-CM | POA: Diagnosis present

## 2022-06-14 DIAGNOSIS — I10 Essential (primary) hypertension: Secondary | ICD-10-CM | POA: Diagnosis present

## 2022-06-14 DIAGNOSIS — Z79890 Hormone replacement therapy (postmenopausal): Secondary | ICD-10-CM | POA: Diagnosis not present

## 2022-06-14 DIAGNOSIS — Z923 Personal history of irradiation: Secondary | ICD-10-CM

## 2022-06-14 DIAGNOSIS — M5441 Lumbago with sciatica, right side: Secondary | ICD-10-CM | POA: Diagnosis present

## 2022-06-14 DIAGNOSIS — R000 Tachycardia, unspecified: Secondary | ICD-10-CM | POA: Diagnosis not present

## 2022-06-14 DIAGNOSIS — K5900 Constipation, unspecified: Secondary | ICD-10-CM | POA: Diagnosis present

## 2022-06-14 DIAGNOSIS — Z79891 Long term (current) use of opiate analgesic: Secondary | ICD-10-CM | POA: Diagnosis not present

## 2022-06-14 DIAGNOSIS — N179 Acute kidney failure, unspecified: Secondary | ICD-10-CM | POA: Diagnosis present

## 2022-06-14 DIAGNOSIS — R410 Disorientation, unspecified: Secondary | ICD-10-CM | POA: Diagnosis not present

## 2022-06-14 DIAGNOSIS — C7951 Secondary malignant neoplasm of bone: Secondary | ICD-10-CM | POA: Diagnosis present

## 2022-06-14 DIAGNOSIS — E785 Hyperlipidemia, unspecified: Secondary | ICD-10-CM | POA: Diagnosis present

## 2022-06-14 DIAGNOSIS — E860 Dehydration: Secondary | ICD-10-CM | POA: Diagnosis present

## 2022-06-14 DIAGNOSIS — E890 Postprocedural hypothyroidism: Secondary | ICD-10-CM | POA: Diagnosis present

## 2022-06-14 DIAGNOSIS — G893 Neoplasm related pain (acute) (chronic): Secondary | ICD-10-CM | POA: Diagnosis present

## 2022-06-14 DIAGNOSIS — Z833 Family history of diabetes mellitus: Secondary | ICD-10-CM | POA: Diagnosis not present

## 2022-06-14 DIAGNOSIS — Z801 Family history of malignant neoplasm of trachea, bronchus and lung: Secondary | ICD-10-CM

## 2022-06-14 DIAGNOSIS — R52 Pain, unspecified: Secondary | ICD-10-CM | POA: Diagnosis not present

## 2022-06-14 DIAGNOSIS — Z8249 Family history of ischemic heart disease and other diseases of the circulatory system: Secondary | ICD-10-CM | POA: Diagnosis not present

## 2022-06-14 DIAGNOSIS — E872 Acidosis, unspecified: Secondary | ICD-10-CM | POA: Diagnosis present

## 2022-06-14 DIAGNOSIS — R9431 Abnormal electrocardiogram [ECG] [EKG]: Secondary | ICD-10-CM | POA: Diagnosis not present

## 2022-06-14 DIAGNOSIS — J910 Malignant pleural effusion: Secondary | ICD-10-CM | POA: Diagnosis present

## 2022-06-14 DIAGNOSIS — N289 Disorder of kidney and ureter, unspecified: Secondary | ICD-10-CM | POA: Diagnosis not present

## 2022-06-14 DIAGNOSIS — E039 Hypothyroidism, unspecified: Secondary | ICD-10-CM | POA: Diagnosis present

## 2022-06-14 DIAGNOSIS — E876 Hypokalemia: Secondary | ICD-10-CM | POA: Diagnosis present

## 2022-06-14 DIAGNOSIS — C9000 Multiple myeloma not having achieved remission: Secondary | ICD-10-CM | POA: Diagnosis not present

## 2022-06-14 DIAGNOSIS — Z79899 Other long term (current) drug therapy: Secondary | ICD-10-CM | POA: Diagnosis not present

## 2022-06-14 DIAGNOSIS — Z823 Family history of stroke: Secondary | ICD-10-CM

## 2022-06-14 DIAGNOSIS — J90 Pleural effusion, not elsewhere classified: Secondary | ICD-10-CM

## 2022-06-14 HISTORY — DX: Malignant (primary) neoplasm, unspecified: C80.1

## 2022-06-14 LAB — CBC WITH DIFFERENTIAL/PLATELET
Abs Immature Granulocytes: 0.09 10*3/uL — ABNORMAL HIGH (ref 0.00–0.07)
Basophils Absolute: 0 10*3/uL (ref 0.0–0.1)
Basophils Relative: 0 %
Eosinophils Absolute: 0.1 10*3/uL (ref 0.0–0.5)
Eosinophils Relative: 1 %
HCT: 27.7 % — ABNORMAL LOW (ref 36.0–46.0)
Hemoglobin: 9 g/dL — ABNORMAL LOW (ref 12.0–15.0)
Immature Granulocytes: 1 %
Lymphocytes Relative: 15 %
Lymphs Abs: 1.5 10*3/uL (ref 0.7–4.0)
MCH: 26.7 pg (ref 26.0–34.0)
MCHC: 32.5 g/dL (ref 30.0–36.0)
MCV: 82.2 fL (ref 80.0–100.0)
Monocytes Absolute: 3 10*3/uL — ABNORMAL HIGH (ref 0.1–1.0)
Monocytes Relative: 32 %
Neutro Abs: 4.8 10*3/uL (ref 1.7–7.7)
Neutrophils Relative %: 51 %
Platelets: 167 10*3/uL (ref 150–400)
RBC: 3.37 MIL/uL — ABNORMAL LOW (ref 3.87–5.11)
RDW: 17.5 % — ABNORMAL HIGH (ref 11.5–15.5)
WBC: 9.4 10*3/uL (ref 4.0–10.5)
nRBC: 0 % (ref 0.0–0.2)

## 2022-06-14 LAB — RAD ONC ARIA SESSION SUMMARY
Course Elapsed Days: 1
Plan Fractions Treated to Date: 2
Plan Prescribed Dose Per Fraction: 2 Gy
Plan Total Fractions Prescribed: 10
Plan Total Prescribed Dose: 20 Gy
Reference Point Dosage Given to Date: 4 Gy
Reference Point Session Dosage Given: 2 Gy
Session Number: 2

## 2022-06-14 LAB — COMPREHENSIVE METABOLIC PANEL
ALT: 30 U/L (ref 0–44)
AST: 39 U/L (ref 15–41)
Albumin: 3.9 g/dL (ref 3.5–5.0)
Alkaline Phosphatase: 86 U/L (ref 38–126)
Anion gap: 16 — ABNORMAL HIGH (ref 5–15)
BUN: 29 mg/dL — ABNORMAL HIGH (ref 8–23)
CO2: 24 mmol/L (ref 22–32)
Calcium: 13.8 mg/dL (ref 8.9–10.3)
Chloride: 95 mmol/L — ABNORMAL LOW (ref 98–111)
Creatinine, Ser: 2.03 mg/dL — ABNORMAL HIGH (ref 0.44–1.00)
GFR, Estimated: 27 mL/min — ABNORMAL LOW (ref >60)
Glucose, Bld: 88 mg/dL (ref 70–99)
Potassium: 4.1 mmol/L (ref 3.5–5.1)
Sodium: 135 mmol/L (ref 135–145)
Total Bilirubin: 1.6 mg/dL — ABNORMAL HIGH (ref 0.3–1.2)
Total Protein: 8 g/dL (ref 6.5–8.1)

## 2022-06-14 MED ORDER — PANTOPRAZOLE SODIUM 40 MG IV SOLR
40.0000 mg | Freq: Once | INTRAVENOUS | Status: AC
Start: 2022-06-14 — End: 2022-06-14
  Administered 2022-06-14: 40 mg via INTRAVENOUS
  Filled 2022-06-14: qty 10

## 2022-06-14 MED ORDER — ROSUVASTATIN CALCIUM 10 MG PO TABS
10.0000 mg | ORAL_TABLET | Freq: Every day | ORAL | Status: DC
  Administered 2022-06-15 – 2022-06-26 (×12): 10 mg via ORAL
  Filled 2022-06-14 (×12): qty 1

## 2022-06-14 MED ORDER — FLUCONAZOLE 100 MG PO TABS
100.0000 mg | ORAL_TABLET | Freq: Every day | ORAL | Status: DC
  Administered 2022-06-15 – 2022-06-26 (×12): 100 mg via ORAL
  Filled 2022-06-14 (×12): qty 1

## 2022-06-14 MED ORDER — ENOXAPARIN SODIUM 40 MG/0.4ML IJ SOSY
40.0000 mg | PREFILLED_SYRINGE | INTRAMUSCULAR | Status: DC
  Administered 2022-06-14 – 2022-06-16 (×3): 40 mg via SUBCUTANEOUS
  Filled 2022-06-14 (×3): qty 0.4

## 2022-06-14 MED ORDER — FENTANYL 25 MCG/HR TD PT72
1.0000 | MEDICATED_PATCH | TRANSDERMAL | Status: DC
  Administered 2022-06-14 – 2022-06-20 (×3): 1 via TRANSDERMAL
  Filled 2022-06-14 (×3): qty 1

## 2022-06-14 MED ORDER — PROCHLORPERAZINE MALEATE 10 MG PO TABS: 10.0000 mg | ORAL_TABLET | Freq: Four times a day (QID) | ORAL | 5 refills | Status: AC | PRN

## 2022-06-14 MED ORDER — HYDROMORPHONE HCL 1 MG/ML IJ SOLN
1.0000 mg | Freq: Once | INTRAMUSCULAR | Status: AC
  Administered 2022-06-14: 1 mg via INTRAVENOUS
  Filled 2022-06-14: qty 1

## 2022-06-14 MED ORDER — ZOLEDRONIC ACID 4 MG/5ML IV CONC
4.0000 mg | Freq: Once | INTRAVENOUS | Status: AC
  Administered 2022-06-14: 4 mg via INTRAVENOUS
  Filled 2022-06-14: qty 5

## 2022-06-14 MED ORDER — VITAMIN D 25 MCG (1000 UNIT) PO TABS
1000.0000 [IU] | ORAL_TABLET | Freq: Every day | ORAL | Status: DC
  Administered 2022-06-15 – 2022-06-26 (×12): 1000 [IU] via ORAL
  Filled 2022-06-14 (×12): qty 1

## 2022-06-14 MED ORDER — DEXAMETHASONE 4 MG PO TABS
8.0000 mg | ORAL_TABLET | Freq: Two times a day (BID) | ORAL | Status: DC
  Administered 2022-06-14: 8 mg via ORAL
  Filled 2022-06-14: qty 2

## 2022-06-14 MED ORDER — COQ10 100 MG PO CAPS: 1.0000 | ORAL_CAPSULE | Freq: Every day | ORAL | Status: DC

## 2022-06-14 MED ORDER — SODIUM CHLORIDE 0.9 % IV SOLN: INTRAVENOUS | Status: DC

## 2022-06-14 MED ORDER — HYDROMORPHONE HCL 1 MG/ML IJ SOLN
1.0000 mg | INTRAMUSCULAR | Status: DC | PRN
  Administered 2022-06-14 – 2022-06-22 (×10): 1 mg via INTRAVENOUS
  Filled 2022-06-14 (×11): qty 1

## 2022-06-14 MED ORDER — LEVOTHYROXINE SODIUM 88 MCG PO TABS
88.0000 ug | ORAL_TABLET | Freq: Every day | ORAL | Status: DC
  Administered 2022-06-15 – 2022-06-26 (×12): 88 ug via ORAL
  Filled 2022-06-14 (×12): qty 1

## 2022-06-14 MED ORDER — HYDROMORPHONE HCL 2 MG PO TABS: 2.0000 mg | ORAL_TABLET | Freq: Four times a day (QID) | ORAL | Status: DC | PRN

## 2022-06-14 MED ORDER — HYDROMORPHONE HCL 2 MG PO TABS
2.0000 mg | ORAL_TABLET | Freq: Four times a day (QID) | ORAL | Status: DC
  Administered 2022-06-15 – 2022-06-22 (×23): 2 mg via ORAL
  Filled 2022-06-14 (×26): qty 1

## 2022-06-14 MED ORDER — LACTATED RINGERS IV SOLN: INTRAVENOUS | Status: DC

## 2022-06-14 MED ORDER — CALCITONIN (SALMON) 200 UNIT/ML IJ SOLN
4.0000 [IU]/kg | Freq: Two times a day (BID) | INTRAMUSCULAR | Status: AC
  Administered 2022-06-14 – 2022-06-15 (×2): 358 [IU] via SUBCUTANEOUS
  Filled 2022-06-14 (×2): qty 1.79

## 2022-06-14 MED ORDER — ONDANSETRON HCL 4 MG/2ML IJ SOLN
4.0000 mg | Freq: Four times a day (QID) | INTRAMUSCULAR | Status: DC | PRN
  Administered 2022-06-20 – 2022-06-25 (×5): 4 mg via INTRAVENOUS
  Filled 2022-06-14 (×5): qty 2

## 2022-06-14 MED ORDER — SODIUM CHLORIDE 0.9 % IV BOLUS
1000.0000 mL | Freq: Once | INTRAVENOUS | Status: AC
  Administered 2022-06-14: 1000 mL via INTRAVENOUS

## 2022-06-14 NOTE — H&P (Signed)
History and Physical    Patient: Michelle [name] WUJ:[id number] DOB: 28-Feb-1961 DOA: 06/14/2022 DOS: the patient was seen and examined on 06/14/2022 PCP: Leeroy Cha, MD  Patient coming from: Home  Chief Complaint:  Chief Complaint  Patient presents with   Tachycardia   HPI: Michelle [name] is a 61 y.o. female with medical history significant of bony metastasis likely secondary to likely multiple myeloma with pathology pending, HTN, hypothyroidism, HLD who presents with worsening intractable back pain.   She has had worsening back pain due to her metastatic disease. Had MRI on 8/3 showing worsening widespread tumour infiltration with new extraosseous tumor in ventral epidural space. She was started on Fentanyl patch 12.39mg and hydromorphone 210mPO q6hr by oncology. She was sent for palliative radiation and had her first session yesterday 8/10.  Started to have recurrent nausea and vomiting since yesterday.  No abdominal pain.  Reports her pain was never well under control even after starting her new pain regimen.  Reports her pain would still be a 9 out of 10.  In the ED, she was afebrile, tachycardic up to HR of 135 initially but normotensive on room air. No leukocytosis. Hgb stable at 9. AKI with creatinine of 2 up from 1. Hypercalcemia of 13.8.  T.bili elevated to 1.6 but LFTs were normal.   Review of Systems: As mentioned in the history of present illness. All other systems reviewed and are negative. Past Medical History:  Diagnosis Date   [facility]   Essential hypertension 02/16/2017   Hyperlipemia    Hyperlipidemia 02/16/2017   Hypertension    Hypothyroidism 02/16/2017   Morbid [place] ([facility]   Past Surgical History:  Procedure Laterality Date   THYROIDECTOMY     Social History:  reports that she has quit smoking. She has never used smokeless tobacco. She reports that she does not drink alcohol and does not use drugs.  Allergies  Allergen Reactions    Penicillins Other (See Comments)    Severe Yeast infection... No deathly reactions   Codeine Nausea Only   Morphine Other (See Comments)    headache   Tramadol     headache    Family History  Problem Relation Age of Onset   Diabetes Paternal Grandfather    Stroke Paternal Grandfather    Heart attack Paternal Grandfather    Diabetes Father    Hypertension Father    Stroke Father    Lung cancer Father    Hypertension Mother    Kidney disease Mother    Hypertension Sister    Stroke Paternal Grandmother    Heart attack Maternal Grandmother     Prior to Admission medications   Medication Sig Start Date End Date Taking? Authorizing Provider  Capsicum, Cayenne, (CAYENNE PO) Take 60 mg by mouth daily.    [provider]  Coenzyme Q10 (COQ10) 100 MG CAPS Take 1 capsule by mouth daily. 04/16/19   [provider]  dexamethasone (DECADRON) 4 MG tablet Take 2 tablets (8 mg total) by mouth 2 (two) times daily. Please make sure you take this with food. 06/07/22   EnVolanda NapoleonMD  ergocalciferol (VITAMIN D2) 1.25 MG (50000 UT) capsule 1 capsule 05/10/22   [provider]  fentaNYL (DURAGESIC) 12 MCG/HR Place 1 patch onto the skin every 3 (three) days. 06/12/22   EnVolanda NapoleonMD  fluconazole (DIFLUCAN) 100 MG tablet Take 1 tablet (100 mg total) by mouth daily. 06/07/22   EnVolanda Napoleon  MD  fluticasone (FLONASE) 50 MCG/ACT nasal spray 1 SPRAY INTO EACH NOSTRIL EVERY DAY FOR 30 DAYS    [provider]  gabapentin (NEURONTIN) 300 MG capsule Take 1 capsule (300 mg total) by mouth 4 (four) times daily. 06/05/22   Volanda Napoleon, MD  HYDROcodone-acetaminophen (NORCO/VICODIN) 5-325 MG tablet 1 tablet as needed    [provider]  HYDROmorphone (DILAUDID) 2 MG tablet Take 1 tablet (2 mg total) by mouth every 6 (six) hours as needed for severe pain. 06/07/22   Volanda Napoleon, MD  levothyroxine (SYNTHROID) 88 MCG tablet 1 TABLET EVERY MORNING ON AN EMPTY  STOMACH ORALLY ONCE A DAY 90 DAYS    [provider]  lisinopril-hydrochlorothiazide (ZESTORETIC) 10-12.5 MG tablet Take 1 tablet by mouth daily. 04/25/22   [provider]  Omega-3 Fatty Acids (FISH OIL) 1000 MG CAPS Take 1 capsule by mouth daily.    [provider]  prochlorperazine (COMPAZINE) 10 MG tablet Take 1 tablet (10 mg total) by mouth every 6 (six) hours as needed for nausea or vomiting. 06/14/22   Tyler Pita, MD  rosuvastatin (CRESTOR) 10 MG tablet TAKE 1 TABLET BY MOUTH EVERY DAY FOR 90 DAYS    [provider]    Physical Exam: Vitals:   06/14/22 1930 06/14/22 1943 06/14/22 1945 06/14/22 2052  BP: (!) 156/82  (!) 153/85 (!) 143/77  Pulse: (!) 120  (!) 119 (!) 129  Resp: _0 Temp:  97.6 F (36.4 C)  97.9 F (36.6 C)  TempSrc:  Oral  Oral  SpO2: 97%  98% 94%  Weight:      Height:       Constitutional: NAD, calm, comfortable, lethargic appearing elderly female laying upright in bed Eyes:  lids and conjunctivae normal ENMT: Mucous membranes are moist.  Neck: normal, supple Respiratory: clear to auscultation bilaterally, no wheezing, no crackles. Normal respiratory effort. No accessory muscle use.  Cardiovascular: Sinus tachycardia, no murmurs / rubs / gallops. No extremity edema.  Abdomen: Soft, no tenderness, no masses palpated.  Bowel sounds positive.  Patient with nausea and dry heaves. Musculoskeletal: no clubbing / cyanosis. No joint deformity upper and lower extremities. Good ROM, no contractures. Normal muscle tone.  Skin: no rashes, lesions, ulcers.  Neurologic: CN 2-12 grossly intact.  Strength 5/5 in all 4.  Patient appears lethargic and had slow response to questioning Psychiatric: Alert and oriented x 3. Normal mood. Data Reviewed:  See HPI  Assessment and Plan: * Intractable pain -widespread tumour infiltration with new extraosseous tumor in ventral epidural space seen on MRI 8/3. Pathology pending from bone  marrow biopsy but likely multiple myeloma. -Follows with Dr. Marin Olp with oncology  -Has seen Dr. Tammi Klippel for palliative radiation with first session 8/10 -continue home Decadron 27m BID although she may not have been taking this. Continue home fluconazole to avoid thrush. -will increase fentanyl patch from 12.556m to 25 mcg -continue PO hydromorphone 2 mg q6hr scheduled. IV dilaudid 62m25m4hr PRN for breakthrough pain.  -recommend consult to oncology in the morning  Tachycardia Suspect multifactorial from pain, dehydration and hypercalcemia -Continue to follow on continuous telemetry.  If this remains elevated despite management of other conditions, will need to consider obtaining echocardiogram or CTA chest.   Metabolic acidosis From Dehydration -continue IV fluid overnight  AKI (acute kidney injury) ([facility] with creatinine of 2 up from 1.  -Continue IV fluids overnight and follow creatinine -avoid nephrotoxic agents  Hypercalcemia Likely  hypercalcemia of malignancy.  Calcium of 13.9 with normal albumin.  Also has encephalopathy on exam. -Continuous IV fluid hydration overnight -Give IV calcitonin x2 doses and follow heart rate closely since she already has tachycardia -Give IV Zometa -Follow repeat in the morning  Hypothyroidism Continue levothyroxine  Essential hypertension Hold Lisinopril-HCTZ for now due to AKI      Advance Care Planning: Full  Consults: None  Family Communication: Discussed with 2 daughters at bedside  Severity of Illness: The appropriate patient status for this patient is INPATIENT. Inpatient status is judged to be reasonable and necessary in order to provide the required intensity of service to ensure the patient's safety. The patient's presenting symptoms, physical exam findings, and initial radiographic and laboratory data in the context of their chronic comorbidities is felt to place them at high risk for further clinical deterioration.  Furthermore, it is not anticipated that the patient will be medically stable for discharge from the hospital within 2 midnights of admission.   * I certify that at the point of admission it is my clinical judgment that the patient will require inpatient hospital care spanning beyond 2 midnights from the point of admission due to high intensity of service, high risk for further deterioration and high frequency of surveillance required.*  Author: Orene Desanctis, DO 06/14/2022 11:16 PM  For on call review [email or web address].

## 2022-06-14 NOTE — Assessment & Plan Note (Addendum)
-  widespread tumour infiltration with new extraosseous tumor in ventral epidural space seen on MRI 8/3. Pathology pending from bone marrow biopsy but likely multiple myeloma. -Follows with Dr. Marin Olp with oncology  -Has seen Dr. Tammi Klippel for palliative radiation with first session 8/10 -continue home Decadron 32m BID although she may not have been taking this. Continue home fluconazole to avoid thrush. -will increase fentanyl patch from 12.556m to 25 mcg -continue PO hydromorphone 2 mg q6hr scheduled. IV dilaudid 38m78m4hr PRN for breakthrough pain.  -recommend consult to oncology in the morning

## 2022-06-14 NOTE — Assessment & Plan Note (Signed)
AKI with creatinine of 2 up from 1.  -Continue IV fluids overnight and follow creatinine -avoid nephrotoxic agents

## 2022-06-14 NOTE — Assessment & Plan Note (Signed)
Continue levothyroxine 

## 2022-06-14 NOTE — ED Provider Notes (Signed)
Michelle [name] DEPT Provider Note   CSN: [id number] Arrival date & time: 06/14/22  1515     History  Chief Complaint  Patient presents with   Tachycardia    Michelle [name] is a 61 y.o. female.  Patient is a 61 year old female with a history of what appears to be multiple myeloma with lesions in her spine.  She presents with intense back pain.  She had an MRI on August 3 which showed multiple lesions in her lower lumbar spine with extradural lesions and nerve compression.  She says she has had back pain for a long time but recently has had worsening.  She is currently undergoing radiation treatments and got to today she said.  She is followed by Dr. Marin Olp.  He recently increased her pain medication, giving her a Duragesic patch as well as Dilaudid 2 mg tablets.  She takes the tablets every 6 hours.  She says that its not controlling her pain and she is unable to deal with the pain anymore.  She denies any nausea or vomiting.  No cough or cold symptoms other than her chronic cough that she has.  No diarrhea.  No fevers.  She has some radiation of the pain down her right leg and some intermittent tingling to her feet.  She feels like her legs are a little bit weak.  However this is not a new change.  The symptoms have been going on for what she says is a long time.  She does not report any new numbness or weakness.  No loss of bowel or bladder control.       Home Medications Prior to Admission medications   Medication Sig Start Date End Date Taking? Authorizing Provider  Capsicum, Cayenne, (CAYENNE PO) Take 60 mg by mouth daily.    [provider]  Coenzyme Q10 (COQ10) 100 MG CAPS Take 1 capsule by mouth daily. 04/16/19   [provider]  dexamethasone (DECADRON) 4 MG tablet Take 2 tablets (8 mg total) by mouth 2 (two) times daily. Please make sure you take this with food. 06/07/22   Volanda Napoleon, MD  ergocalciferol (VITAMIN D2) 1.25 MG (50000  UT) capsule 1 capsule 05/10/22   [provider]  fentaNYL (DURAGESIC) 12 MCG/HR Place 1 patch onto the skin every 3 (three) days. 06/12/22   Volanda Napoleon, MD  fluconazole (DIFLUCAN) 100 MG tablet Take 1 tablet (100 mg total) by mouth daily. 06/07/22   Volanda Napoleon, MD  fluticasone (FLONASE) 50 MCG/ACT nasal spray 1 SPRAY INTO EACH NOSTRIL EVERY DAY FOR 30 DAYS    [provider]  gabapentin (NEURONTIN) 300 MG capsule Take 1 capsule (300 mg total) by mouth 4 (four) times daily. 06/05/22   Volanda Napoleon, MD  HYDROcodone-acetaminophen (NORCO/VICODIN) 5-325 MG tablet 1 tablet as needed    [provider]  HYDROmorphone (DILAUDID) 2 MG tablet Take 1 tablet (2 mg total) by mouth every 6 (six) hours as needed for severe pain. 06/07/22   Volanda Napoleon, MD  levothyroxine (SYNTHROID) 88 MCG tablet 1 TABLET EVERY MORNING ON AN EMPTY STOMACH ORALLY ONCE A DAY 90 DAYS    [provider]  lisinopril-hydrochlorothiazide (ZESTORETIC) 10-12.5 MG tablet Take 1 tablet by mouth daily. 04/25/22   [provider]  Omega-3 Fatty Acids (FISH OIL) 1000 MG CAPS Take 1 capsule by mouth daily.    [provider]  prochlorperazine (COMPAZINE) 10 MG tablet Take 1 tablet (10 mg  total) by mouth every 6 (six) hours as needed for nausea or vomiting. 06/14/22   Margaretmary Dys, MD  rosuvastatin (CRESTOR) 10 MG tablet TAKE 1 TABLET BY MOUTH EVERY DAY FOR 90 DAYS    [provider]      Allergies    Penicillins, Codeine, Morphine, and Tramadol    Review of Systems   Review of Systems  Constitutional:  Negative for chills, diaphoresis, fatigue and fever.  HENT:  Negative for congestion, rhinorrhea and sneezing.   Eyes: Negative.   Respiratory:  Negative for cough, chest tightness and shortness of breath.   Cardiovascular:  Negative for chest pain and leg swelling.  Gastrointestinal:  Negative for abdominal pain, blood in stool, diarrhea, nausea and vomiting.   Genitourinary:  Negative for difficulty urinating, flank pain, frequency and hematuria.  Musculoskeletal:  Positive for back pain. Negative for arthralgias.  Skin:  Negative for rash.  Neurological:  Negative for dizziness, speech difficulty, weakness, numbness and headaches.    Physical Exam Updated Vital Signs BP 113/66   Pulse (!) 114   Temp 97.8 F (36.6 C) (Oral)   Resp 14   Ht 5' 4.25" (1.632 m)   Wt 89.5 kg   SpO2 93%   BMI 33.62 kg/m  Physical Exam Constitutional:      Appearance: She is well-developed.  HENT:     Head: Normocephalic and atraumatic.  Eyes:     Pupils: Pupils are equal, round, and reactive to light.  Cardiovascular:     Rate and Rhythm: Regular rhythm. Tachycardia present.     Heart sounds: Normal heart sounds.  Pulmonary:     Effort: Pulmonary effort is normal. No respiratory distress.     Breath sounds: Normal breath sounds. No wheezing or rales.  Chest:     Chest wall: No tenderness.  Abdominal:     General: Bowel sounds are normal.     Palpations: Abdomen is soft.     Tenderness: There is no abdominal tenderness. There is no guarding or rebound.  Musculoskeletal:        General: Normal range of motion.     Cervical back: Normal range of motion and neck supple.     Comments: Positive tenderness to her lower lumbar region, pedal pulses are intact, she has normal sensation to the lower extremities bilaterally, she is able to do a straight leg raise bilaterally and can raise both legs off the bed but they are little bit weak, 4 out of 5 strength, this is baseline for her per her report.  Lymphadenopathy:     Cervical: No cervical adenopathy.  Skin:    General: Skin is warm and dry.     Findings: No rash.  Neurological:     Mental Status: She is alert and oriented to person, place, and time.     ED Results / Procedures / Treatments   Labs (all labs ordered are listed, but only abnormal results are displayed) Labs Reviewed  COMPREHENSIVE  METABOLIC PANEL - Abnormal; Notable for the following components:      Result Value   Chloride 95 (*)    BUN 29 (*)    Creatinine, Ser 2.03 (*)    Calcium 13.8 (*)    Total Bilirubin 1.6 (*)    GFR, Estimated 27 (*)    Anion gap 16 (*)    All other components within normal limits  CBC WITH DIFFERENTIAL/PLATELET - Abnormal; Notable for the following components:   RBC 3.37 (*)  Hemoglobin 9.0 (*)    HCT 27.7 (*)    RDW 17.5 (*)    Monocytes Absolute 3.0 (*)    Abs Immature Granulocytes 0.09 (*)    All other components within normal limits  URINALYSIS, ROUTINE W REFLEX MICROSCOPIC    EKG EKG Interpretation  Date/Time:  Friday June 14 2022 15:44:19 EDT Ventricular Rate:  124 PR Interval:  143 QRS Duration: 83 QT Interval:  298 QTC Calculation: 428 R Axis:   50 Text Interpretation: Sinus tachycardia Borderline repolarization abnormality Confirmed by Malvin Johns [id number]) on 06/14/2022 3:57:34 PM  Radiology No results found.  Procedures Procedures    Medications Ordered in ED Medications  sodium chloride 0.9 % bolus 1,000 mL (1,000 mLs Intravenous New Bag/Given 06/14/22 1646)  HYDROmorphone (DILAUDID) injection 1 mg (1 mg Intravenous Given 06/14/22 1646)  pantoprazole (PROTONIX) injection 40 mg (40 mg Intravenous Given 06/14/22 1804)    ED Course/ Medical Decision Making/ A&P                           Medical Decision Making Amount and/or Complexity of Data Reviewed Labs: ordered.  Risk Prescription drug management. Decision regarding hospitalization.   Patient is a 61 year old female who presents with worsening back pain related to some spinal lesion she has from multiple myeloma.  She has some weakness in her legs and some tingling in her feet but it sounds like this is not an acute change.  No other symptoms that sound more concerning for cauda equina.  Her pain is not well controlled with new pain medications that she started at home.  She did get some IV  Dilaudid in the ED and is feeling better after that.  Her labs are concerning for an elevated calcium at 13.8 and an acute kidney injury with a creatinine of 2.01 which is increased from her baseline around 1.  She was started on IV fluids.  I feel that she would benefit from inpatient treatment for ongoing hydration and monitoring of her calcium as well as pain control.  I spoke with Dr. Flossie Buffy who will admit the pt.  Final Clinical Impression(s) / ED Diagnoses Final diagnoses:  Acute bilateral low back pain with right-sided sciatica  Hypercalcemia  AKI (acute kidney injury) [facility])    Rx / DC Orders ED Discharge Orders     None         Malvin Johns, MD 06/14/22 Valerie Roys

## 2022-06-14 NOTE — Assessment & Plan Note (Signed)
Hold Lisinopril-HCTZ for now due to AKI

## 2022-06-14 NOTE — Assessment & Plan Note (Signed)
Suspect multifactorial from pain, dehydration and hypercalcemia -Continue to follow on continuous telemetry.  If this remains elevated despite management of other conditions, will need to consider obtaining echocardiogram or CTA chest.

## 2022-06-14 NOTE — Progress Notes (Signed)
PHARMACIST - PHYSICIAN ORDER COMMUNICATION  CONCERNING: P&T Medication Policy on Herbal Medications  DESCRIPTION:  This patient's order for:  CoQ10  has been noted.  This product(s) is classified as an "herbal" or natural product. Due to a lack of definitive safety studies or FDA approval, nonstandard manufacturing practices, plus the potential risk of unknown drug-drug interactions while on inpatient medications, the Pharmacy and Therapeutics Committee does not permit the use of "herbal" or natural products of this type within [facility].   ACTION TAKEN: The pharmacy department is unable to verify this order at this time and your patient has been informed of this safety policy. Please reevaluate patient's clinical condition at discharge and address if the herbal or natural product(s) should be resumed at that time.   Dolly Rias RPh 06/14/2022, 11:20 PM

## 2022-06-14 NOTE — ED Triage Notes (Signed)
Pt to er, pt states that her doctor told her to come to the er for her pain, states that they were also concerned about her heart rate.  States that she has a hx of cancer and has had two radiation treatments

## 2022-06-14 NOTE — ED Notes (Signed)
Pt in bed with eyes closed, resps even and unlabored, family at bedside, pt arouses to verbal stim, pt reports decreased pain,

## 2022-06-14 NOTE — Progress Notes (Signed)
Called to check on patient and her pain.  Patient is still in significant pain. She received the fentanyl patches and placed one on Wednesday evening. She is still taking her dilaudid '2mg'$  every 6h. She states there is little to no relief. Her pain is to her lower back, buttocks and legs. She had her second radiation treatment today.  Spoke to Dr Marin Olp. If patient's pain cannot be controlled with patch and oral dilaudid he recommends that the patient go to the emergency room for IV pain management.   Notified patient of Dr Antonieta Pert recommendation. She was tearful at the suggestion and understood, but was unsure of what she was going to do.   Oncology Nurse Navigator Documentation     06/14/2022    1:45 PM  Oncology Nurse Navigator Flowsheets  Planned Course of Treatment Radiation  Phase of Treatment Radiation  Radiation Actual Start Date: 06/13/2022  Navigator Follow Up Date: 06/18/2022  Navigator Follow Up Reason: Follow-up After Biopsy  Navigator Location [facility]  Navigator Encounter Type Telephone  Telephone Patient Update;Symptom Mgt;Outgoing Call  Treatment Initiated Date 06/13/2022  Patient Visit Type MedOnc  Treatment Phase Active Tx  Barriers/Navigation Needs Coordination of Care;Education  Education Pain/ Symptom Management;Other  Interventions Education;Psycho-Social Support  Acuity Level 2-Minimal Needs (1-2 Barriers Identified)  Education Method Verbal  Support Groups/Services Friends and Family  Time Spent with Patient 67

## 2022-06-14 NOTE — Assessment & Plan Note (Signed)
From Dehydration -continue IV fluid overnight

## 2022-06-14 NOTE — Assessment & Plan Note (Addendum)
Likely hypercalcemia of malignancy.  Calcium of 13.9 with normal albumin.  Also has encephalopathy on exam. -Continuous IV fluid hydration overnight -Give IV calcitonin x2 doses and follow heart rate closely since she already has tachycardia -Give IV Zometa -Follow repeat in the morning

## 2022-06-15 ENCOUNTER — Inpatient Hospital Stay (HOSPITAL_COMMUNITY): Payer: BC Managed Care – PPO

## 2022-06-15 ENCOUNTER — Encounter (HOSPITAL_COMMUNITY): Payer: Self-pay | Admitting: Family Medicine

## 2022-06-15 DIAGNOSIS — N289 Disorder of kidney and ureter, unspecified: Secondary | ICD-10-CM

## 2022-06-15 DIAGNOSIS — C9000 Multiple myeloma not having achieved remission: Secondary | ICD-10-CM

## 2022-06-15 DIAGNOSIS — R52 Pain, unspecified: Secondary | ICD-10-CM | POA: Diagnosis not present

## 2022-06-15 LAB — BASIC METABOLIC PANEL
Anion gap: 11 (ref 5–15)
BUN: 31 mg/dL — ABNORMAL HIGH (ref 8–23)
CO2: 25 mmol/L (ref 22–32)
Calcium: 12.2 mg/dL — ABNORMAL HIGH (ref 8.9–10.3)
Chloride: 101 mmol/L (ref 98–111)
Creatinine, Ser: 2.07 mg/dL — ABNORMAL HIGH (ref 0.44–1.00)
GFR, Estimated: 26 mL/min — ABNORMAL LOW (ref >60)
Glucose, Bld: 97 mg/dL (ref 70–99)
Potassium: 3.9 mmol/L (ref 3.5–5.1)
Sodium: 137 mmol/L (ref 135–145)

## 2022-06-15 LAB — VITAMIN B12: Vitamin B-12: 584 pg/mL (ref 180–914)

## 2022-06-15 MED ORDER — METOPROLOL TARTRATE 25 MG PO TABS
25.0000 mg | ORAL_TABLET | Freq: Two times a day (BID) | ORAL | Status: DC
  Administered 2022-06-15 – 2022-06-26 (×23): 25 mg via ORAL
  Filled 2022-06-15 (×23): qty 1

## 2022-06-15 MED ORDER — DEXAMETHASONE SODIUM PHOSPHATE 10 MG/ML IJ SOLN
20.0000 mg | INTRAMUSCULAR | Status: AC
  Administered 2022-06-15 – 2022-06-18 (×4): 20 mg via INTRAVENOUS
  Filled 2022-06-15 (×4): qty 2

## 2022-06-15 MED ORDER — PANTOPRAZOLE SODIUM 40 MG PO TBEC
40.0000 mg | DELAYED_RELEASE_TABLET | Freq: Two times a day (BID) | ORAL | Status: DC
  Administered 2022-06-15 – 2022-06-26 (×22): 40 mg via ORAL
  Filled 2022-06-15 (×23): qty 1

## 2022-06-15 MED ORDER — LIDOCAINE HCL 1 % IJ SOLN
INTRAMUSCULAR | Status: AC
  Filled 2022-06-15: qty 20

## 2022-06-15 MED ORDER — TECHNETIUM TO 99M ALBUMIN AGGREGATED
4.0000 | Freq: Once | INTRAVENOUS | Status: AC | PRN
  Administered 2022-06-15: 4 via INTRAVENOUS

## 2022-06-15 MED ORDER — ZOLEDRONIC ACID 4 MG/5ML IV CONC: 4.0000 mg | Freq: Once | INTRAVENOUS | Status: DC

## 2022-06-15 MED ORDER — DRONABINOL 2.5 MG PO CAPS
5.0000 mg | ORAL_CAPSULE | Freq: Two times a day (BID) | ORAL | Status: DC
  Administered 2022-06-15 – 2022-06-21 (×12): 5 mg via ORAL
  Filled 2022-06-15: qty 2
  Filled 2022-06-15 (×10): qty 1
  Filled 2022-06-15: qty 2

## 2022-06-15 MED ORDER — FAMCICLOVIR 500 MG PO TABS
500.0000 mg | ORAL_TABLET | Freq: Every day | ORAL | Status: DC
  Administered 2022-06-15 – 2022-06-26 (×12): 500 mg via ORAL
  Filled 2022-06-15 (×12): qty 1

## 2022-06-15 NOTE — Progress Notes (Signed)
Right LE venous duplex study completed. Please see CV Proc for preliminary results.  Elen Acero BS, RVT 06/15/2022 1:46 PM

## 2022-06-15 NOTE — Progress Notes (Addendum)
PROGRESS NOTE    Michelle [name]  MLY:[id number] DOB: 08-25-1961 DOA: 06/14/2022 PCP: Leeroy Cha, MD   Brief Narrative: 61 year old with past medical history significant for bony metastasis likely secondary to multiple myeloma, pathology pending, hypertension, hypothyroidism, hyperlipidemia who presented with worsening intractable back pain.  She has had worsening back pain due to her metastatic disease.  MRI 8/3 showed worsening widespread tumor infiltrate with new extraosseous tumor in the ventral epidural space.  She has been on fentanyl and hydromorphone for pain management.  She is started radiation therapy 80/10.  Patient admitted with uncontrolled pain, AKI, hypercalcemia.    Assessment & Plan:   Principal Problem:   Intractable pain Active Problems:   Essential hypertension   Hypothyroidism   Metastasis to bone (HCC)   Hypercalcemia   AKI (acute kidney injury) ([place])   Metabolic acidosis   Tachycardia   1-Intractable pain widespread tumor infiltration with new extraosseous tumors in the ventral epidural space. MRI 8/03. -Pathology Bone marrow Bx; hypercellular bone marrow with plasma cell neoplasm. -Free kappa light chain 1.153.  M spike 1.8 -Dr. Marin Olp primary oncologist -Started radiation treatment 8/10 under the care of Dr. Tammi Klippel. -Continue with Decadron 8 mg twice daily -Fentanyl patch increased from 12.5 to 25 mcg Continue with oral hydromorphone as needed, Started on IV Dilaudid PRN Plan to proceed with [facility] cath. Probably 8/014  2-Tachycardia: Continue with IV fluids.  Check VQ scan. No convincing evidence of PE>  Start low dose metoprolol   Moderate right Pleural effusion:  Will order thoracenteses.  Check ECHO.   Anion gap metabolic acidosis: In the setting of AKI Continue with IV fluids Resolved.  AKI: Present with a creatinine of 2.0, baseline 1.0  Hypercalcemia: Presents with calcium at 13.8. Of malignancy Received IV Zometa   Continue with calcitonin   Hypothyroidism: Continue with Synthroid  Hypertension: Agree with holding lisinopril and hydrochlorothiazide due to AKI and hypercalcemia  GERD; PPI BID     Estimated body mass index is 33.62 kg/m as calculated from the following:   Height as of this encounter: 5' 4.25" (1.632 m).   Weight as of this encounter: 89.5 kg.   DVT prophylaxis: Lovenox Code Status: Full code Family Communication: family at bedside.  Disposition Plan:  Status is: Inpatient Remains inpatient appropriate because: Management of uncontrolled pain, hypercalcemia, AKI.    Consultants:  Oncology   Procedures:    Antimicrobials:    Subjective: She report chest pain, epigastric [pain. Back pain.  Tingling BL feet.   Objective: Vitals:   06/14/22 1945 06/14/22 2052 06/15/22 0102 06/15/22 0535  BP: (!) 153/85 (!) 143/77 114/69 119/70  Pulse: (!) 119 (!) 129 (!) 126 (!) 126  Resp: _0 Temp:  97.9 F (36.6 C) 98 F (36.7 C) 98.4 F (36.9 C)  TempSrc:  Oral Oral Oral  SpO2: 98% 94% 96% 95%  Weight:      Height:        Intake/Output Summary (Last 24 hours) at 06/15/2022 0648 Last data filed at 06/15/2022 0500 Gross per 24 hour  Intake 1606.19 ml  Output --  Net 1606.19 ml   Filed Weights   06/14/22 1534  Weight: 89.5 kg    Examination:  General exam: Appears calm and comfortable  Respiratory system: Clear to auscultation. Respiratory effort normal. Cardiovascular system: S1 & S2 heard, RRR. No JVD, murmurs, rubs, gallops or clicks. No pedal edema. Gastrointestinal system: Abdomen is nondistended, soft and nontender. No organomegaly or masses  felt. Normal bowel sounds heard. Central nervous system: Alert and oriented. No focal neurological deficits. Extremities: Symmetric 5 x 5 power.   Data Reviewed: I have personally reviewed following labs and imaging studies  CBC: Recent Labs  Lab 06/14/22 1633  WBC 9.4  NEUTROABS 4.8  HGB 9.0*   HCT 27.7*  MCV 82.2  PLT 978   Basic Metabolic Panel: Recent Labs  Lab 06/14/22 1633 06/15/22 0342  NA 135 137  K 4.1 3.9  CL 95* 101  CO2 24 25  GLUCOSE 88 97  BUN 29* 31*  CREATININE 2.03* 2.07*  CALCIUM 13.8* 12.2*   GFR: Estimated Creatinine Clearance: 30.3 mL/min (A) (by C-G formula based on SCr of 2.07 mg/dL (H)). Liver Function Tests: Recent Labs  Lab 06/14/22 1633  AST 39  ALT 30  ALKPHOS 86  BILITOT 1.6*  PROT 8.0  ALBUMIN 3.9   No results for input(s): "LIPASE", "AMYLASE" in the last 168 hours. No results for input(s): "AMMONIA" in the last 168 hours. Coagulation Profile: No results for input(s): "INR", "PROTIME" in the last 168 hours. Cardiac Enzymes: No results for input(s): "CKTOTAL", "CKMB", "CKMBINDEX", "TROPONINI" in the last 168 hours. BNP (last 3 results) No results for input(s): "PROBNP" in the last 8760 hours. HbA1C: No results for input(s): "HGBA1C" in the last 72 hours. CBG: No results for input(s): "GLUCAP" in the last 168 hours. Lipid Profile: No results for input(s): "CHOL", "HDL", "LDLCALC", "TRIG", "CHOLHDL", "LDLDIRECT" in the last 72 hours. Thyroid Function Tests: No results for input(s): "TSH", "T4TOTAL", "FREET4", "T3FREE", "THYROIDAB" in the last 72 hours. Anemia Panel: No results for input(s): "VITAMINB12", "FOLATE", "FERRITIN", "TIBC", "IRON", "RETICCTPCT" in the last 72 hours. Sepsis Labs: No results for input(s): "PROCALCITON", "LATICACIDVEN" in the last 168 hours.  No results found for this or any previous visit (from the past 240 hour(s)).       Radiology Studies: No results found.      Scheduled Meds:  calcitonin  4 Units/kg Subcutaneous BID   cholecalciferol  1,000 Units Oral Daily   dexamethasone  8 mg Oral BID   enoxaparin (LOVENOX) injection  40 mg Subcutaneous Q24H   fentaNYL  1 patch Transdermal Q72H   fluconazole  100 mg Oral Daily   HYDROmorphone  2 mg Oral Q6H   levothyroxine  88 mcg Oral Q0600    rosuvastatin  10 mg Oral Daily   Continuous Infusions:  sodium chloride 75 mL/hr at 06/14/22 2123     LOS: 1 day    Time spent: 35 minutes    Bryler Dibble A Mallie Linnemann, MD [facility]   If 7PM-7AM, please contact night-coverage [email or web address]  06/15/2022, 6:48 AM

## 2022-06-15 NOTE — Consult Note (Signed)
Michelle [name] is well-known to me.  She is a very charming 61 year old African-American female.  I first saw her back in late June.  At that time, I felt that she had myeloma.  We saw in the office, she had IgG kappa myeloma.  Her IgG level was about 2400 mg/dL.  The Kappa light chain was 1153 mg/L.  She had a lot of bone lesions.  She had fractures.  She had epidural tumor.  We did do a bone marrow biopsy on her.  This was done on 06/07/2022.  This showed multiple myeloma.  She had atypical plasma cells.  Patient have a lot of lower back pain.  She started radiation therapy.  She had 2 doses.  Despite oral pain medication.  She is having worsening pain.  As such, she is being admitted for pain control.  She is doing a little better.  IV pain medication seems to be helping her out.  Again, I know she has myeloma.  This is in the need to be treated quickly.  She has extensive bony involvement.    When she moves, she had marked hypercalcemia calcium 13.8 and albumin 3.9.  Her bilirubin was up a little bit.  She had mild renal insufficiency with a BUN of 29 creatinine 2.03.  Her CBC which again showed a white cell count 9.4.  Hemoglobin 9.  Platelet count 167,000.  He is having some tingling in the toes.  She was started on some steroids.  I will increase the steroid dose.  She is got Zometa.  She has  gotten calcitonin.  Her calcium has come down to 12.2.  We will have to get started on treatment.  She will is a candidate for transplant.  I need to see what her cytogenetics are.  We will need to have a Port-A-Cath placed.  This will be very important.    Vital signs are temperature 98.4.  Pulse 126.  Blood pressure 119/70.  Lungs are clear bilaterally.  Cardiac exam is tachycardic but regular.  Abdomen is soft.  Bowel sounds are present.  Is no fluid wave.  Extremity shows no clubbing, cyanosis or edema.  She has decent strength in her legs.  There is good range of motion of her joints.  Neurological  exam is nonfocal.  Right now, we will try to continue to adjust her pain medication.  She clearly will need radiation therapy to continue.  Again I would increase her steroids which will hopefully be somewhat toxic to the myeloma cells.  We do need to get a 24-hour urine on her to see how much light chain is in her urine.  Again she has some renal insufficiency.  I do appreciate the great care that she is getting from everybody up on [place], MD  Hebrews 12:12

## 2022-06-15 NOTE — H&P (Signed)
Chief Complaint: Patient was seen in consultation today for multiple myeloma  at the request of Burney Gauze, MD  Referring Physician(s): Burney Gauze, MD  Supervising Physician: Michaelle Birks  Patient Status: [facility] - In-pt  History of Present Illness: Michelle [name] is a 61 y.o. female with PMH of HLD, HTN and hypothyroidism. She is well-known to IR service having bone marrow biopsy 06/07/2022 that resulted hypercellular bone marrow with plasma cell neoplasm.Patient followed by oncology for multiple myeloma with bony diastases in the thoracolumbar spine.  Patient presented to ED 06/14/2022 complaining of intractable back pain not controlled by pain medication.  She has started radiation therapy and has been referred to IR by Dr. Marin Olp for tunneled catheter with port placement to start chemotherapy.  Past Medical History:  Diagnosis Date   Cancer [facility])    Essential hypertension 02/16/2017   Hyperlipemia    Hyperlipidemia 02/16/2017   Hypertension    Hypothyroidism 02/16/2017   Morbid obesity ([place]) 02/16/2017    Past Surgical History:  Procedure Laterality Date   THYROIDECTOMY      Allergies: Penicillins, Codeine, Morphine, and Tramadol  Medications: Prior to Admission medications   Medication Sig Start Date End Date Taking? Authorizing Provider  cholecalciferol (VITAMIN D3) 25 MCG (1000 UNIT) tablet Take 1,000 Units by mouth daily.   Yes [provider]  Coenzyme Q10 (COQ10) 100 MG CAPS Take 1 capsule by mouth daily. 04/16/19  Yes [provider]  fentaNYL (DURAGESIC) 12 MCG/HR Place 1 patch onto the skin every 3 (three) days. 06/12/22  Yes Volanda Napoleon, MD  fluconazole (DIFLUCAN) 100 MG tablet Take 1 tablet (100 mg total) by mouth daily. 06/07/22  Yes Ennever, Rudell Cobb, MD  HYDROmorphone (DILAUDID) 2 MG tablet Take 1 tablet (2 mg total) by mouth every 6 (six) hours as needed for severe pain. 06/07/22  Yes Volanda Napoleon, MD  levothyroxine (SYNTHROID) 88  MCG tablet 1 TABLET EVERY MORNING ON AN EMPTY STOMACH ORALLY ONCE A DAY 90 DAYS   Yes [provider]  lisinopril-hydrochlorothiazide (ZESTORETIC) 10-12.5 MG tablet Take 1 tablet by mouth daily. 04/25/22  Yes [provider]  rosuvastatin (CRESTOR) 10 MG tablet TAKE 1 TABLET BY MOUTH EVERY DAY FOR 90 DAYS   Yes [provider]  dexamethasone (DECADRON) 4 MG tablet Take 2 tablets (8 mg total) by mouth 2 (two) times daily. Please make sure you take this with food. Patient not taking: Reported on 06/14/2022 06/07/22   Volanda Napoleon, MD  gabapentin (NEURONTIN) 300 MG capsule Take 1 capsule (300 mg total) by mouth 4 (four) times daily. Patient not taking: Reported on 06/14/2022 06/05/22   Volanda Napoleon, MD  prochlorperazine (COMPAZINE) 10 MG tablet Take 1 tablet (10 mg total) by mouth every 6 (six) hours as needed for nausea or vomiting. 06/14/22   Tyler Pita, MD     Family History  Problem Relation Age of Onset   Diabetes Paternal Grandfather    Stroke Paternal Grandfather    Heart attack Paternal Grandfather    Diabetes Father    Hypertension Father    Stroke Father    Lung cancer Father    Hypertension Mother    Kidney disease Mother    Hypertension Sister    Stroke Paternal Grandmother    Heart attack Maternal Grandmother     Social History   Socioeconomic History   Marital status: Widowed    Spouse name: Not on file   Number of children: Not  on file   Years of education: Not on file   Highest education level: Not on file  Occupational History   Not on file  Tobacco Use   Smoking status: Former   Smokeless tobacco: Never  Vaping Use   Vaping Use: Never used  Substance and Sexual Activity   Alcohol use: No   Drug use: No   Sexual activity: Not Currently    Birth control/protection: Post-menopausal  Other Topics Concern   Not on file  Social History Narrative   Not on file   Social Determinants of Health   Financial Resource Strain: Not  on file  Food Insecurity: Not on file  Transportation Needs: Not on file  Physical Activity: Not on file  Stress: Not on file  Social Connections: Not on file    Review of Systems: A 12 point ROS discussed and pertinent positives are indicated in the HPI above.  All other systems are negative.  Review of Systems  Constitutional:  Positive for appetite change, fatigue and unexpected weight change. Negative for chills and fever.  Respiratory:  Positive for cough. Negative for shortness of breath.   Cardiovascular:  Positive for chest pain. Negative for leg swelling.  Gastrointestinal:  Positive for nausea and vomiting. Negative for abdominal pain.  Musculoskeletal:  Positive for back pain.  Neurological:  Positive for weakness. Negative for dizziness and headaches.    Vital Signs: BP 119/70 (BP Location: Left Arm)   Pulse (!) 126   Temp 98.4 F (36.9 C) (Oral)   Resp 16   Ht 5' 4.25" (1.632 m)   Wt 197 lb 6.4 oz (89.5 kg)   SpO2 95%   BMI 33.62 kg/m     Physical Exam Vitals reviewed.  Constitutional:      General: She is not in acute distress.    Appearance: Normal appearance. She is not ill-appearing.  HENT:     Head: Normocephalic and atraumatic.     Mouth/Throat:     Mouth: Mucous membranes are dry.  Cardiovascular:     Rate and Rhythm: Regular rhythm. Tachycardia present.     Pulses: Normal pulses.     Heart sounds: Normal heart sounds.  Pulmonary:     Effort: Pulmonary effort is normal. No respiratory distress.     Breath sounds: Normal breath sounds.  Abdominal:     General: Bowel sounds are normal. There is no distension.     Palpations: Abdomen is soft.     Tenderness: There is no abdominal tenderness. There is no guarding.  Musculoskeletal:     Right lower leg: No edema.     Left lower leg: No edema.  Skin:    General: Skin is warm and dry.  Neurological:     Mental Status: She is alert and oriented to person, place, and time.  Psychiatric:         Mood and Affect: Mood normal.        Behavior: Behavior normal.        Thought Content: Thought content normal.        Judgment: Judgment normal.     Imaging: CT BONE MARROW BIOPSY & ASPIRATION  Result Date: 06/07/2022 INDICATION: 61 year old with numerous lytic bone lesions and concern for myeloma. Request for bone marrow biopsy. EXAM: CT GUIDED BONE MARROW ASPIRATES AND BIOPSY Physician: Stephan Minister. Henn, MD MEDICATIONS: Moderate sedation ANESTHESIA/SEDATION: Moderate (conscious) sedation was employed during this procedure. A total of Versed 2.$RemoveBef'0mg'[id number]$  and fentanyl 100 mcg and 25 mg Benadryl  was administered intravenously at the order of the provider performing the procedure. Total intra-service moderate sedation time: 19 minutes. Patient's level of consciousness and vital signs were monitored continuously by radiology nurse throughout the procedure under the supervision of the provider performing the procedure. COMPLICATIONS: None immediate. PROCEDURE: The procedure was explained to the patient. The risks and benefits of the procedure were discussed and the patient's questions were addressed. Informed consent was obtained from the patient. The patient was placed prone on CT table. Images of the pelvis were obtained. The left side of back was prepped and draped in sterile fashion. The skin and left posterior ilium were anesthetized with 1% lidocaine. 11 gauge bone needle was directed into the left ilium with CT guidance. Two aspirates and two core biopsies were obtained. Bandage placed over the puncture site. RADIATION DOSE REDUCTION: This exam was performed according to the departmental dose-optimization program which includes automated exposure control, adjustment of the mA and/or kV according to patient size and/or use of iterative reconstruction technique. FINDINGS: Large destructive lytic lesion involving the left ilium. Scattered lytic lesions throughout the right ilium. Biopsy was obtained from the  posterior left ilium in order to avoid the lytic lesions. Biopsy needle was directed in the posterior left ilium. Initially it was difficult to get any aspirate material. Eventually some aspirate material was obtained. Two core biopsies were obtained. IMPRESSION: CT guided bone marrow aspiration and core biopsy. Electronically Signed   By: Markus Daft M.D.   On: 06/07/2022 13:39   MR Lumbar Spine W Wo Contrast  Result Date: 06/06/2022 CLINICAL DATA:  Low back pain. Cauda carina syndrome suspected. Severe pain and right leg radiation. Hematologic malignancy. EXAM: MRI LUMBAR SPINE WITHOUT AND WITH CONTRAST TECHNIQUE: Multiplanar and multiecho pulse sequences of the lumbar spine were obtained without and with intravenous contrast. CONTRAST:  45mL GADAVIST GADOBUTROL 1 MMOL/ML IV SOLN COMPARISON:  MRI lumbar spine 04/27/2022 FINDINGS: Segmentation:  5 lumbar vertebra Alignment:  Normal Vertebrae: Widespread and diffuse bone marrow abnormality consistent with tumor infiltration. This is present throughout the lumbar spine as well as the sacrum and iliac bones. Bony tumor infiltration has progressed in the interval. Mild fracture of T12 unchanged. There is now extraosseous tumor in the ventral epidural space and to the left of the vertebral body which was not seen previously. Mild fracture of L1 is unchanged.  No epidural tumor Moderate fracture of L3 is unchanged. 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 No epidural tumor Mild compression fracture of L5 is unchanged. Ventral epidural tumor has developed in the interval with borderline spinal stenosis. Multiple enhancing lesions in the spinous processes of T12, L1, L2, L3, L4 best seen on postcontrast images. These are difficult to see on the prior noncontrast CT. Expansile lesion in the left iliac wing has progressed. Conus medullaris and cauda equina: Conus extends to the L2 level. Conus and cauda equina appear normal. Paraspinal and other soft  tissues: Progression of paraspinous tumor on the left at L1 and on the right at L3. Bilateral iliac lymph nodes have enlarged measuring 16 mm on the right and 13 mm on the left Disc levels: T12-L1: Ventral epidural tumor at T12 without cord compression L1-2: Negative L2-3: Negative L3-4: Mild disc and moderate facet degeneration. Negative for stenosis L4-5: Mild degenerative change in the disc and facets without stenosis. L5-S1: Disc and facet degeneration.  Negative for stenosis. IMPRESSION: 1. Widespread tumor infiltration of the bone marrow has progressed in the interval. 2. Interval  development of ventral epidural tumor extension at T12 and L5. Progressive paraspinous tumor infiltration lateral to the vertebral body on the right at L3. 3. Expansile lesion left iliac bone has progressed. 4. Iliac lymph nodes are enlarged consistent with tumor involvement and have progressed in the interval. Electronically Signed   By: Franchot Gallo M.D.   On: 06/06/2022 17:33    Labs:  CBC: Recent Labs    05/02/22 1054 06/07/22 0748 06/14/22 1633  WBC 7.9 8.8 9.4  HGB 9.5* 9.1* 9.0*  HCT 30.3* 28.2* 27.7*  PLT 153 175 167    COAGS: No results for input(s): "INR", "APTT" in the last 8760 hours.  BMP: Recent Labs    05/02/22 1054 06/14/22 1633 06/15/22 0342  NA 138 135 137  K 3.8 4.1 3.9  CL 99 95* 101  CO2 $Re'31 24 25  'Nul$ GLUCOSE 112* 88 97  BUN 13 29* 31*  CALCIUM 12.0* 13.8* 12.2*  CREATININE 1.01* 2.03* 2.07*  GFRNONAA >60 27* 26*    LIVER FUNCTION TESTS: Recent Labs    05/02/22 1054 06/14/22 1633  BILITOT 0.4 1.6*  AST 28 39  ALT 22 30  ALKPHOS 86 86  PROT 8.6* 8.0  ALBUMIN 4.5 3.9    TUMOR MARKERS: No results for input(s): "AFPTM", "CEA", "CA199", "CHROMGRNA" in the last 8760 hours.  Assessment and Plan: History of HLD, HTN and hypothyroidism. She is well-known to IR service having bone marrow biopsy 06/07/2022 that resulted hypercellular bone marrow with plasma cell  neoplasm.Patient followed by oncology for multiple myeloma with bony diastases in the thoracolumbar spine.  Patient presented to ED 06/14/2022 complaining of intractable back pain not controlled by pain medication. She has started radiation therapy and has been referred to IR by Dr. Marin Olp for tunneled catheter with port placement to start chemotherapy.   Pt observed to be sitting on edge of bed talking on phone with family at bedside.  She is A&O,calm and pleasant.  She is in no distress.  Procedure tentatively scheduled for 06/17/22.  Risks and benefits of image guided catheter with port placement was discussed with the patient including, but not limited to bleeding, infection, pneumothorax, or fibrin sheath development and need for additional procedures.  All of the patient's questions were answered, patient is agreeable to proceed. Consent signed and in chart.   Thank you for this interesting consult.  I greatly enjoyed meeting Michelle [name] and look forward to participating in their care.  A copy of this report was sent to the requesting provider on this date.  Electronically Signed: Tyson Alias, NP 06/15/2022, 8:45 AM   I spent a total of 20 minutes in face to face in clinical consultation, greater than 50% of which was counseling/coordinating care for multiple myeloma bony metastases.

## 2022-06-16 ENCOUNTER — Inpatient Hospital Stay (HOSPITAL_COMMUNITY): Payer: BC Managed Care – PPO

## 2022-06-16 DIAGNOSIS — R9431 Abnormal electrocardiogram [ECG] [EKG]: Secondary | ICD-10-CM | POA: Diagnosis not present

## 2022-06-16 DIAGNOSIS — R52 Pain, unspecified: Secondary | ICD-10-CM | POA: Diagnosis not present

## 2022-06-16 LAB — COMPREHENSIVE METABOLIC PANEL
ALT: 24 U/L (ref 0–44)
AST: 25 U/L (ref 15–41)
Albumin: 3.6 g/dL (ref 3.5–5.0)
Alkaline Phosphatase: 74 U/L (ref 38–126)
Anion gap: 10 (ref 5–15)
BUN: 43 mg/dL — ABNORMAL HIGH (ref 8–23)
CO2: 22 mmol/L (ref 22–32)
Calcium: 10.6 mg/dL — ABNORMAL HIGH (ref 8.9–10.3)
Chloride: 106 mmol/L (ref 98–111)
Creatinine, Ser: 2.05 mg/dL — ABNORMAL HIGH (ref 0.44–1.00)
GFR, Estimated: 27 mL/min — ABNORMAL LOW (ref >60)
Glucose, Bld: 130 mg/dL — ABNORMAL HIGH (ref 70–99)
Potassium: 3.7 mmol/L (ref 3.5–5.1)
Sodium: 138 mmol/L (ref 135–145)
Total Bilirubin: 0.7 mg/dL (ref 0.3–1.2)
Total Protein: 6.9 g/dL (ref 6.5–8.1)

## 2022-06-16 LAB — ECHOCARDIOGRAM COMPLETE
Area-P 1/2: 4.94 cm2
Calc EF: 66.5 %
Height: 64.25 in
MV VTI: 3.43 cm2
S' Lateral: 2.3 cm
Single Plane A2C EF: 65.1 %
Single Plane A4C EF: 69.6 %
Weight: 3158.4 oz

## 2022-06-16 LAB — CBC WITH DIFFERENTIAL/PLATELET
Abs Immature Granulocytes: 0.14 10*3/uL — ABNORMAL HIGH (ref 0.00–0.07)
Basophils Absolute: 0 10*3/uL (ref 0.0–0.1)
Basophils Relative: 0 %
Eosinophils Absolute: 0 10*3/uL (ref 0.0–0.5)
Eosinophils Relative: 0 %
HCT: 24.7 % — ABNORMAL LOW (ref 36.0–46.0)
Hemoglobin: 8.1 g/dL — ABNORMAL LOW (ref 12.0–15.0)
Immature Granulocytes: 1 %
Lymphocytes Relative: 6 %
Lymphs Abs: 0.7 10*3/uL (ref 0.7–4.0)
MCH: 27 pg (ref 26.0–34.0)
MCHC: 32.8 g/dL (ref 30.0–36.0)
MCV: 82.3 fL (ref 80.0–100.0)
Monocytes Absolute: 2.5 10*3/uL — ABNORMAL HIGH (ref 0.1–1.0)
Monocytes Relative: 21 %
Neutro Abs: 8.5 10*3/uL — ABNORMAL HIGH (ref 1.7–7.7)
Neutrophils Relative %: 72 %
Platelets: 164 10*3/uL (ref 150–400)
RBC: 3 MIL/uL — ABNORMAL LOW (ref 3.87–5.11)
RDW: 17.4 % — ABNORMAL HIGH (ref 11.5–15.5)
WBC: 11.9 10*3/uL — ABNORMAL HIGH (ref 4.0–10.5)
nRBC: 0 % (ref 0.0–0.2)

## 2022-06-16 MED ORDER — POLYETHYLENE GLYCOL 3350 17 G PO PACK
17.0000 g | PACK | Freq: Two times a day (BID) | ORAL | Status: DC
Start: 2022-06-16 — End: 2022-06-19
  Administered 2022-06-16 – 2022-06-18 (×4): 17 g via ORAL
  Filled 2022-06-16 (×4): qty 1

## 2022-06-16 MED ORDER — SENNOSIDES-DOCUSATE SODIUM 8.6-50 MG PO TABS
1.0000 | ORAL_TABLET | Freq: Two times a day (BID) | ORAL | Status: DC
  Administered 2022-06-16 – 2022-06-18 (×4): 1 via ORAL
  Filled 2022-06-16 (×4): qty 1

## 2022-06-16 MED ORDER — BOOST / RESOURCE BREEZE PO LIQD CUSTOM
1.0000 | Freq: Two times a day (BID) | ORAL | Status: DC
  Administered 2022-06-16: 1 via ORAL

## 2022-06-16 MED ORDER — ADULT MULTIVITAMIN W/MINERALS CH
1.0000 | ORAL_TABLET | Freq: Every day | ORAL | Status: DC
  Administered 2022-06-16 – 2022-06-26 (×10): 1 via ORAL
  Filled 2022-06-16 (×10): qty 1

## 2022-06-16 MED ORDER — RESOURCE INSTANT PROTEIN PO PWD PACKET
1.0000 | Freq: Three times a day (TID) | ORAL | Status: DC
  Administered 2022-06-22 – 2022-06-26 (×5): 6 g via ORAL
  Filled 2022-06-16 (×30): qty 6

## 2022-06-16 NOTE — Progress Notes (Signed)
  Echocardiogram 2D Echocardiogram has been performed.  Bobbye Charleston 06/16/2022, 2:15 PM

## 2022-06-16 NOTE — Progress Notes (Signed)
Dr. Maryelizabeth Kaufmann approved thoracentesis procedure at the same time as port placement.   Pt was seen at bedside to discuss performing thoracentesis while under sedation for port placement.   Advised pt that future thoracenteses would be performed with only local anesthetic, not sedation.   Pt in agreement to proceed.  Consent signed in chart.     Narda Rutherford, AGNP-BC 06/16/2022, 11:21 AM

## 2022-06-16 NOTE — Progress Notes (Signed)
Pt arrived to [place] 06/15/22 for thoracentesis. Pt states that lidocaine does not work for her and she needs something stronger.  Patient was advised to talk with ordering physician about providing pain medication prior to procedure and plan for thoracentesis 06/16/2022. Ultrasound tech called patient's nurse who advised patient has decided she needs anesthesia for thoracentesis due to pain from thoracolumbar bony metastasis. We will discuss doing thoracentesis with IR MD while patient is sedated for tunneled catheter with port placement 06/17/2022.   Narda Rutherford, AGNP-BC 06/16/2022, 9:52 AM

## 2022-06-16 NOTE — Progress Notes (Signed)
Initial Nutrition Assessment  DOCUMENTATION CODES:   Obesity unspecified  INTERVENTION:  Liberalize diet from a heart healthy to a regular diet to provide widest variety of menu options to enhance nutritional adequacy Discontinue Boost Breeze Magic cup TID with meals, each supplement provides 290 kcal and 9 grams of protein Beneprotein powder TID, each supplement provides 25 kcal and 6 grams of protein per serving MVI with minerals daily  NUTRITION DIAGNOSIS:   Increased nutrient needs related to cancer and cancer related treatments as evidenced by estimated needs  GOAL:   Patient will meet greater than or equal to 90% of their needs  MONITOR:   PO intake, Supplement acceptance, Diet advancement, Labs, Weight trends, I & O's  REASON FOR ASSESSMENT:   Malnutrition Screening Tool    ASSESSMENT:   Pt admitted for tachycardia and intractable pain. PMH significant for bony metastasis likely secondary to multiple myeloma with pathology pending, HTN, hypothyroidism and HLD.  Per IR, noted plans for thoracentesis and port placement tomorrow.  Unsuccessful attempt to reach pt via phone call to room x2. Received busy tone each time.   Per review of chart, pt noted to have had recurrent n/v since 8/10. Also noted to have started palliative radiation on 8/10.   No documented meal completions on file. Reached out to RN. She states that pt has not had n/v today, is trying to eat but does not like Boost Breeze supplements as they are too sweet. She also does not like the milk based nutrition supplements.   Reviewed weight history. Pt's weight noted to have significant up and down fluctuations within the last 2 months. No weight history to review prior to June to track more consistent weight trends. Current admit wt: 89.5 kg. Will continue to monitor throughout admission.   Patient would benefit from a liberalized diet and continuation of nutrition supplements given her increased needs  r/t cancer and initiation of cancer treatments.   Medications: decadron, marinol, synthroid, protonix, miralax, senna IV drips: NaCl $RemoveBef'@100ml'puBQIJCxNU$ /hr   Labs; BUN 43, Cr 2/05, Ca 10.6, GFR 27  UOP: 743ml x24 hours I/O's; +3622ml since admission  NUTRITION - FOCUSED PHYSICAL EXAM: RD working remotely. Deferred to follow up.  Diet Order:   Diet Order             Diet regular Room service appropriate? Yes; Fluid consistency: Thin  Diet effective now                   EDUCATION NEEDS:   No education needs have been identified at this time  Skin:  Skin Assessment: Reviewed RN Assessment  Last BM:  PTA  Height:   Ht Readings from Last 1 Encounters:  06/14/22 5' 4.25" (1.632 m)    Weight:   Wt Readings from Last 1 Encounters:  06/14/22 89.5 kg    Ideal Body Weight:  54.5 kg  BMI:  Body mass index is 33.62 kg/m.  Estimated Nutritional Needs:   Kcal:  1800-2000  Protein:  90-105g  Fluid:  >/=1.8L  Clayborne Dana, RDN, LDN Clinical Nutrition

## 2022-06-16 NOTE — Progress Notes (Addendum)
PROGRESS NOTE    Michelle [name]  NKN:[id number] DOB: 1961-09-13 DOA: 06/14/2022 PCP: Leeroy Cha, MD   Brief Narrative: 61 year old with past medical history significant for bony metastasis likely secondary to multiple myeloma, pathology pending, hypertension, hypothyroidism, hyperlipidemia who presented with worsening intractable back pain.  She has had worsening back pain due to her metastatic disease.  MRI 8/3 showed worsening widespread tumor infiltrate with new extraosseous tumor in the ventral epidural space.  She has been on fentanyl and hydromorphone for pain management.  She is started radiation therapy 80/10.  Patient admitted with uncontrolled pain, AKI, hypercalcemia.  Found to have  moderate right pleural effusion that appears to be loculated. Plan to undergo Port-A-Cath placement and thoracentesis 8/14   Assessment & Plan:   Principal Problem:   Intractable pain Active Problems:   Essential hypertension   Hypothyroidism   Metastasis to bone (HCC)   Hypercalcemia   AKI (acute kidney injury) ([place])   Metabolic acidosis   Tachycardia   1-Intractable pain widespread tumor infiltration with new extraosseous tumors in the ventral epidural space. MRI 8/03. -Pathology Bone marrow Bx; hypercellular bone marrow with plasma cell neoplasm. -Free kappa light chain 1.153.  M spike 1.8 -Dr. Marin Olp primary oncologist -Started radiation treatment 8/10 under the care of Dr. Tammi Klippel. -Continue with Decadron 8 mg twice daily -Fentanyl patch increased from 12.5 to 25 mcg -Continue with oral hydromorphone as needed, -Started on IV Dilaudid PRN -Plan to proceed with [facility] cath. Probably 8/14  2-Tachycardia: Continue with IV fluids.  VQ scan. No convincing evidence of PE>  Started  low dose metoprolol  Improved.   Moderate right Pleural effusion:  Thoracenteses 8/14. Check ECHO.   Anion gap metabolic acidosis: In the setting of AKI Continue with IV  fluids Resolved.  AKI: Present with a creatinine of 2.0, baseline 1.0 In setting of MM.  Cr stable.   Hypercalcemia: Presents with calcium at 13.8. Of malignancy Received IV Zometa  Continue with calcitonin Ca down to 10.   Hypothyroidism: Continue with Synthroid  Hypertension: Agree with holding lisinopril and hydrochlorothiazide due to AKI and hypercalcemia  GERD; PPI BID  Anemia of Malignancy.  Related to MM>  Monitor hb. Slowly trending down.   Constipation; started on Miralax and senna.  Estimated body mass index is 33.62 kg/m as calculated from the following:   Height as of this encounter: 5' 4.25" (1.632 m).   Weight as of this encounter: 89.5 kg.   DVT prophylaxis: Lovenox Code Status: Full code Family Communication: family at bedside.  Disposition Plan:  Status is: Inpatient Remains inpatient appropriate because: Management of uncontrolled pain, hypercalcemia, AKI.    Consultants:  Oncology   Procedures:    Antimicrobials:    Subjective: She relates she will need sedation for thoracentesis.  She denies dyspnea.    Objective: Vitals:   06/15/22 1953 06/15/22 2319 06/16/22 0556 06/16/22 1313  BP: (!) 110/56 112/69 (!) 143/80 120/64  Pulse: (!) 124 (!) 114 (!) 106 (!) 108  Resp: _0 Temp: 99.1 F (37.3 C) 97.9 F (36.6 C) 97.7 F (36.5 C) 97.9 F (36.6 C)  TempSrc: Oral Oral Oral Oral  SpO2: 92% 94% 97% 97%  Weight:      Height:        Intake/Output Summary (Last 24 hours) at 06/16/2022 1323 Last data filed at 06/16/2022 1000 Gross per 24 hour  Intake 2270.64 ml  Output 550 ml  Net 1720.64 ml    Danley Danker  Weights   06/14/22 1534  Weight: 89.5 kg    Examination:  General exam: NAD Respiratory system: CTA Cardiovascular system: S 1,, S  2 RRR Gastrointestinal system: BS present, soft nt Central nervous system: alert Extremities: Symmetric 5 x 5 power.   Data Reviewed: I have personally reviewed following labs and  imaging studies  CBC: Recent Labs  Lab 06/14/22 1633 06/16/22 0403  WBC 9.4 11.9*  NEUTROABS 4.8 8.5*  HGB 9.0* 8.1*  HCT 27.7* 24.7*  MCV 82.2 82.3  PLT 167 037    Basic Metabolic Panel: Recent Labs  Lab 06/14/22 1633 06/15/22 0342 06/16/22 0403  NA 135 137 138  K 4.1 3.9 3.7  CL 95* 101 106  CO2 _0 GLUCOSE 88 97 130*  BUN 29* 31* 43*  CREATININE 2.03* 2.07* 2.05*  CALCIUM 13.8* 12.2* 10.6*    GFR: Estimated Creatinine Clearance: 30.6 mL/min (A) (by C-G formula based on SCr of 2.05 mg/dL (H)). Liver Function Tests: Recent Labs  Lab 06/14/22 1633 06/16/22 0403  AST 39 25  ALT 30 24  ALKPHOS 86 74  BILITOT 1.6* 0.7  PROT 8.0 6.9  ALBUMIN 3.9 3.6    No results for input(s): "LIPASE", "AMYLASE" in the last 168 hours. No results for input(s): "AMMONIA" in the last 168 hours. Coagulation Profile: No results for input(s): "INR", "PROTIME" in the last 168 hours. Cardiac Enzymes: No results for input(s): "CKTOTAL", "CKMB", "CKMBINDEX", "TROPONINI" in the last 168 hours. BNP (last 3 results) No results for input(s): "PROBNP" in the last 8760 hours. HbA1C: No results for input(s): "HGBA1C" in the last 72 hours. CBG: No results for input(s): "GLUCAP" in the last 168 hours. Lipid Profile: No results for input(s): "CHOL", "HDL", "LDLCALC", "TRIG", "CHOLHDL", "LDLDIRECT" in the last 72 hours. Thyroid Function Tests: No results for input(s): "TSH", "T4TOTAL", "FREET4", "T3FREE", "THYROIDAB" in the last 72 hours. Anemia Panel: Recent Labs    06/15/22 1338  VITAMINB12 584   Sepsis Labs: No results for input(s): "PROCALCITON", "LATICACIDVEN" in the last 168 hours.  No results found for this or any previous visit (from the past 240 hour(s)).       Radiology Studies: VAS [place] LOWER EXTREMITY VENOUS (DVT)  Result Date: 06/16/2022  Lower Venous DVT Study Patient Name:  Michelle [name] [facility]  Date of Exam:   06/15/2022 Medical Rec #: [id number]     Accession #:     [id number] Date of Birth: 08-27-1961     Patient Gender: F Patient Age:   29 years Exam Location:  [facility] Procedure:      VAS [place] LOWER EXTREMITY VENOUS (DVT) Referring Phys: Jerald Kief Phyliss Hulick --------------------------------------------------------------------------------  Indications: Pain.  Comparison Study: No previous exam noted. Performing Technologist: Bobetta Lime BS, RVT  Examination Guidelines: A complete evaluation includes B-mode imaging, spectral Doppler, color Doppler, and power Doppler as needed of all accessible portions of each vessel. Bilateral testing is considered an integral part of a complete examination. Limited examinations for reoccurring indications may be performed as noted. The reflux portion of the exam is performed with the patient in reverse Trendelenburg.  +---------+---------------+---------+-----------+----------+--------------+ RIGHT    CompressibilityPhasicitySpontaneityPropertiesThrombus Aging +---------+---------------+---------+-----------+----------+--------------+ CFV      Full           Yes      Yes                                 +---------+---------------+---------+-----------+----------+--------------+  SFJ      Full                                                        +---------+---------------+---------+-----------+----------+--------------+ FV Prox  Full                                                        +---------+---------------+---------+-----------+----------+--------------+ FV Mid   Full                                                        +---------+---------------+---------+-----------+----------+--------------+ FV DistalFull                                                        +---------+---------------+---------+-----------+----------+--------------+ PFV      Full                                                        +---------+---------------+---------+-----------+----------+--------------+  POP      Full           Yes      Yes                                 +---------+---------------+---------+-----------+----------+--------------+ PTV      Full                                                        +---------+---------------+---------+-----------+----------+--------------+ PERO     Full                                                        +---------+---------------+---------+-----------+----------+--------------+   +----+---------------+---------+-----------+----------+--------------+ LEFTCompressibilityPhasicitySpontaneityPropertiesThrombus Aging +----+---------------+---------+-----------+----------+--------------+ CFV Full           Yes      Yes                                 +----+---------------+---------+-----------+----------+--------------+     Summary: RIGHT: - No evidence of deep vein thrombosis in the lower extremity. No indirect evidence of obstruction proximal to the inguinal ligament. - No cystic structure found in the popliteal fossa.  LEFT: - No evidence of common femoral vein obstruction.  *See table(s) above for measurements and observations.  Electronically signed by Orlie Pollen on 06/16/2022 at 10:42:18 AM.    Final    DG Chest 2 View  Result Date: 06/15/2022 CLINICAL DATA:  Chest pain and shortness of breath.  Tachycardia. EXAM: CHEST - 2 VIEW COMPARISON:  Same day nuclear medicine pulmonary perfusion scan. FINDINGS: The heart size and mediastinal contours are partially obscured but appear within normal limits. Partially loculated moderate-sized right pleural effusion with adjacent atelectasis versus consolidation. Left lung is clear. No acute osseous abnormality. IMPRESSION: Partially loculated moderate right pleural effusion with adjacent atelectasis versus consolidation Electronically Signed   By: Dahlia Bailiff M.D.   On: 06/15/2022 12:54   NM Pulmonary Perfusion  Result Date: 06/15/2022 CLINICAL DATA:  Chest pain and shortness of  breath concern for pulmonary embolus EXAM: NUCLEAR MEDICINE PERFUSION LUNG SCAN TECHNIQUE: Perfusion images were obtained in multiple projections after intravenous injection of radiopharmaceutical. Ventilation scans intentionally deferred if perfusion scan and chest x-ray adequate for interpretation during COVID 19 epidemic. RADIOPHARMACEUTICALS:  4.0 mCi Tc-28mMAA IV COMPARISON:  Same day chest radiograph.  PET-CT May 13, 2022 FINDINGS: Decreased perfusion throughout the right hemithorax with linear band of hypoperfusion seen along the major fissure (line sign) which corresponds with a moderate-sized pleural effusion seen on same day chest radiograph. No segmental mismatched perfusion defects identified. IMPRESSION: 1. No convincing scintigraphic evidence of pulmonary embolus. 2. Matched perfusion defect throughout the right hemithorax with line sign corresponding with moderate-sized pleural effusion and atelectasis/consolidation seen on same day chest radiograph. Electronically Signed   By: JDahlia BailiffM.D.   On: 06/15/2022 12:52        Scheduled Meds:  cholecalciferol  1,000 Units Oral Daily   dexamethasone (DECADRON) injection  20 mg Intravenous Q24H   dronabinol  5 mg Oral BID AC   enoxaparin (LOVENOX) injection  40 mg Subcutaneous Q24H   famciclovir  500 mg Oral Daily   feeding supplement  1 Container Oral BID BM   fentaNYL  1 patch Transdermal Q72H   fluconazole  100 mg Oral Daily   HYDROmorphone  2 mg Oral Q6H   levothyroxine  88 mcg Oral Q0600   metoprolol tartrate  25 mg Oral BID   pantoprazole  40 mg Oral BID   polyethylene glycol  17 g Oral BID   rosuvastatin  10 mg Oral Daily   senna-docusate  1 tablet Oral BID   Continuous Infusions:  sodium chloride 100 mL/hr at 06/16/22 0541     LOS: 2 days    Time spent: 35 minutes    Sharilynn Cassity A Jaemarie Hochberg, MD [facility]   If 7PM-7AM, please contact night-coverage [email or web address]  06/16/2022, 1:23 PM

## 2022-06-17 ENCOUNTER — Inpatient Hospital Stay (HOSPITAL_COMMUNITY): Payer: BC Managed Care – PPO

## 2022-06-17 ENCOUNTER — Ambulatory Visit
Admission: RE | Admit: 2022-06-17 | Discharge: 2022-06-17 | Disposition: A | Discharge status: Home / Self Care | Payer: BC Managed Care – PPO | Source: Ambulatory Visit | Attending: Radiation Oncology | Admitting: Radiation Oncology

## 2022-06-17 ENCOUNTER — Other Ambulatory Visit: Payer: Self-pay | Admitting: *Deleted

## 2022-06-17 ENCOUNTER — Other Ambulatory Visit: Payer: Self-pay

## 2022-06-17 ENCOUNTER — Encounter: Payer: Self-pay | Admitting: *Deleted

## 2022-06-17 DIAGNOSIS — R52 Pain, unspecified: Secondary | ICD-10-CM | POA: Diagnosis not present

## 2022-06-17 HISTORY — PX: IR THORACENTESIS ASP PLEURAL SPACE W/IMG GUIDE: IMG5380

## 2022-06-17 HISTORY — PX: IR IMAGING GUIDED PORT INSERTION: IMG5740

## 2022-06-17 LAB — UPEP/UIFE/LIGHT CHAINS/TP, 24-HR UR: Total Volume: 1000

## 2022-06-17 LAB — CBC WITH DIFFERENTIAL/PLATELET
Abs Immature Granulocytes: 0.15 10*3/uL — ABNORMAL HIGH (ref 0.00–0.07)
Basophils Absolute: 0 10*3/uL (ref 0.0–0.1)
Basophils Relative: 0 %
Eosinophils Absolute: 0 10*3/uL (ref 0.0–0.5)
Eosinophils Relative: 0 %
HCT: 24.3 % — ABNORMAL LOW (ref 36.0–46.0)
Hemoglobin: 7.9 g/dL — ABNORMAL LOW (ref 12.0–15.0)
Immature Granulocytes: 1 %
Lymphocytes Relative: 4 %
Lymphs Abs: 0.6 10*3/uL — ABNORMAL LOW (ref 0.7–4.0)
MCH: 26.8 pg (ref 26.0–34.0)
MCHC: 32.5 g/dL (ref 30.0–36.0)
MCV: 82.4 fL (ref 80.0–100.0)
Monocytes Absolute: 2.8 10*3/uL — ABNORMAL HIGH (ref 0.1–1.0)
Monocytes Relative: 21 %
Neutro Abs: 9.7 10*3/uL — ABNORMAL HIGH (ref 1.7–7.7)
Neutrophils Relative %: 74 %
Platelets: 174 10*3/uL (ref 150–400)
RBC: 2.95 MIL/uL — ABNORMAL LOW (ref 3.87–5.11)
RDW: 17.8 % — ABNORMAL HIGH (ref 11.5–15.5)
WBC: 13.2 10*3/uL — ABNORMAL HIGH (ref 4.0–10.5)
nRBC: 0 % (ref 0.0–0.2)

## 2022-06-17 LAB — RAD ONC ARIA SESSION SUMMARY
Course Elapsed Days: 4
Plan Fractions Treated to Date: 3
Plan Prescribed Dose Per Fraction: 2 Gy
Plan Total Fractions Prescribed: 10
Plan Total Prescribed Dose: 20 Gy
Reference Point Dosage Given to Date: 6 Gy
Reference Point Session Dosage Given: 2 Gy
Session Number: 3

## 2022-06-17 LAB — COMPREHENSIVE METABOLIC PANEL
ALT: 20 U/L (ref 0–44)
AST: 23 U/L (ref 15–41)
Albumin: 3.6 g/dL (ref 3.5–5.0)
Alkaline Phosphatase: 73 U/L (ref 38–126)
Anion gap: 10 (ref 5–15)
BUN: 49 mg/dL — ABNORMAL HIGH (ref 8–23)
CO2: 22 mmol/L (ref 22–32)
Calcium: 9.4 mg/dL (ref 8.9–10.3)
Chloride: 106 mmol/L (ref 98–111)
Creatinine, Ser: 2.1 mg/dL — ABNORMAL HIGH (ref 0.44–1.00)
GFR, Estimated: 26 mL/min — ABNORMAL LOW (ref >60)
Glucose, Bld: 132 mg/dL — ABNORMAL HIGH (ref 70–99)
Potassium: 3.8 mmol/L (ref 3.5–5.1)
Sodium: 138 mmol/L (ref 135–145)
Total Bilirubin: 0.5 mg/dL (ref 0.3–1.2)
Total Protein: 7 g/dL (ref 6.5–8.1)

## 2022-06-17 LAB — BODY FLUID CELL COUNT WITH DIFFERENTIAL
Lymphs, Fluid: 2 %
Monocyte-Macrophage-Serous Fluid: 98 % — ABNORMAL HIGH (ref 50–90)
Other Cells, Fluid: NONE SEEN %
Total Nucleated Cell Count, Fluid: 16603 cu mm — ABNORMAL HIGH (ref 0–1000)

## 2022-06-17 LAB — PROTEIN, PLEURAL OR PERITONEAL FLUID: Total protein, fluid: 4.7 g/dL

## 2022-06-17 LAB — ABO/RH: ABO/RH(D): A POS

## 2022-06-17 LAB — LACTATE DEHYDROGENASE, PLEURAL OR PERITONEAL FLUID: LD, Fluid: 332 U/L — ABNORMAL HIGH (ref 3–23)

## 2022-06-17 LAB — PREPARE RBC (CROSSMATCH)

## 2022-06-17 LAB — GLUCOSE, PLEURAL OR PERITONEAL FLUID: Glucose, Fluid: 36 mg/dL

## 2022-06-17 LAB — GLUCOSE, CAPILLARY: Glucose-Capillary: 138 mg/dL — ABNORMAL HIGH (ref 70–99)

## 2022-06-17 MED ORDER — AZITHROMYCIN 500 MG IV SOLR
500.0000 mg | INTRAVENOUS | Status: DC
  Administered 2022-06-17 – 2022-06-18 (×2): 500 mg via INTRAVENOUS
  Filled 2022-06-17 (×2): qty 5

## 2022-06-17 MED ORDER — FENTANYL CITRATE (PF) 100 MCG/2ML IJ SOLN
INTRAMUSCULAR | Status: AC | PRN
  Administered 2022-06-17 (×2): 50 ug via INTRAVENOUS

## 2022-06-17 MED ORDER — FUROSEMIDE 10 MG/ML IJ SOLN
20.0000 mg | Freq: Once | INTRAMUSCULAR | Status: AC
  Administered 2022-06-17: 20 mg via INTRAVENOUS
  Filled 2022-06-17: qty 2

## 2022-06-17 MED ORDER — SODIUM CHLORIDE 0.9 % IV SOLN
2.0000 g | INTRAVENOUS | Status: DC
  Administered 2022-06-17 – 2022-06-18 (×2): 2 g via INTRAVENOUS
  Filled 2022-06-17 (×2): qty 20

## 2022-06-17 MED ORDER — LIDOCAINE HCL 1 % IJ SOLN
INTRAMUSCULAR | Status: AC
  Filled 2022-06-17: qty 20

## 2022-06-17 MED ORDER — MIDAZOLAM HCL 2 MG/2ML IJ SOLN
INTRAMUSCULAR | Status: AC
  Filled 2022-06-17: qty 4

## 2022-06-17 MED ORDER — LIDOCAINE-EPINEPHRINE 1 %-1:100000 IJ SOLN
INTRAMUSCULAR | Status: AC | PRN
  Administered 2022-06-17: 20 mL

## 2022-06-17 MED ORDER — HEPARIN SOD (PORK) LOCK FLUSH 100 UNIT/ML IV SOLN
INTRAVENOUS | Status: AC
  Filled 2022-06-17: qty 5

## 2022-06-17 MED ORDER — FENTANYL CITRATE (PF) 100 MCG/2ML IJ SOLN
INTRAMUSCULAR | Status: AC
  Filled 2022-06-17: qty 2

## 2022-06-17 MED ORDER — LIDOCAINE-EPINEPHRINE 1 %-1:100000 IJ SOLN
INTRAMUSCULAR | Status: AC
  Filled 2022-06-17: qty 1

## 2022-06-17 MED ORDER — ENOXAPARIN SODIUM 30 MG/0.3ML IJ SOSY
30.0000 mg | PREFILLED_SYRINGE | INTRAMUSCULAR | Status: DC
  Administered 2022-06-17 – 2022-06-25 (×9): 30 mg via SUBCUTANEOUS
  Filled 2022-06-17 (×9): qty 0.3

## 2022-06-17 MED ORDER — MIDAZOLAM HCL 2 MG/2ML IJ SOLN
INTRAMUSCULAR | Status: AC | PRN
  Administered 2022-06-17 (×2): 1 mg via INTRAVENOUS

## 2022-06-17 MED ORDER — SODIUM CHLORIDE 0.9% IV SOLUTION: Freq: Once | INTRAVENOUS | Status: AC

## 2022-06-17 NOTE — Progress Notes (Addendum)
PROGRESS NOTE    Michelle [name]  FXT:[id number] DOB: 1960/11/17 DOA: 06/14/2022 PCP: Leeroy Cha, MD   Brief Narrative: 61 year old with past medical history significant for bony metastasis likely secondary to multiple myeloma, pathology pending, hypertension, hypothyroidism, hyperlipidemia who presented with worsening intractable back pain.  She has had worsening back pain due to her metastatic disease.  MRI 8/3 showed worsening widespread tumor infiltrate with new extraosseous tumor in the ventral epidural space.  She has been on fentanyl and hydromorphone for pain management.  She is started radiation therapy 8/10.  Patient admitted with uncontrolled pain, AKI, hypercalcemia.  Found to have  moderate right pleural effusion that appears to be loculated. Plan to undergo Port-A-Cath placement and thoracentesis 8/14   Assessment & Plan:   Principal Problem:   Intractable pain Active Problems:   Essential hypertension   Hypothyroidism   Metastasis to bone (HCC)   Hypercalcemia   AKI (acute kidney injury) (Michelle [name])   Metabolic acidosis   Tachycardia   1-Intractable pain widespread tumor infiltration with new extraosseous tumors in the ventral epidural space. MRI 8/03. -Pathology Bone marrow Bx; hypercellular bone marrow with plasma cell neoplasm. -Free kappa light chain 1.153.  M spike 1.8 -Dr. Marin Olp primary oncologist -Started radiation treatment 8/10 under the care of Dr. Tammi Klippel. -Continue with Decadron 20 mg IV daily.  -Fentanyl patch increased from 12.5 to 25 mcg -Continue with oral hydromorphone as needed, -on  IV Dilaudid PRN -Plan to proceed with Port cath  8/14  2-Tachycardia: Continue with IV fluids.  VQ scan. No convincing evidence of PE>  Started  low dose metoprolol  Improved.   Moderate right Pleural effusion:  Thoracenteses 8/14 to further  evaluate ECHO: Ejection fraction 65 to 70% indeterminate diastolic filling pressure. Will start IV Ceftriaxone  and Zithromax, fluid results pending, but LDH elevated at 333,/ start antibiotics in case exudative effusion.   Anion gap metabolic acidosis: In the setting of AKI Treated with IV fluids.  Resolved.  AKI: in setting of MM.  Present with a creatinine of 2.0, baseline 1.0 In setting of MM.  Cr stable.  Decrease IV fluids rate.   Hypercalcemia: Presents with calcium at 13.8. Of malignancy Received IV Zometa  Continue with calcitonin Ca down to 10.   Hypothyroidism: Continue with Synthroid  Hypertension: Agree with holding lisinopril and hydrochlorothiazide due to AKI and hypercalcemia  GERD; PPI BID  Anemia of Malignancy.  Related to MM>  2 units PRBC transfusion order by Dr Marin Olp   Constipation; started on Miralax and senna.  Estimated body mass index is 33.62 kg/m as calculated from the following:   Height as of this encounter: 5' 4.25" (1.632 m).   Weight as of this encounter: 89.5 kg.   DVT prophylaxis: Lovenox Code Status: Full code Family Communication: family at bedside.  Disposition Plan:  Status is: Inpatient Remains inpatient appropriate because: Management of uncontrolled pain, hypercalcemia, AKI.    Consultants:  Oncology   Procedures:    Antimicrobials:    Subjective: She denies worsening shortness of breath, she has been eating well.  She is awaiting to have Port-A-Cath and thoracentesis   Objective: Vitals:   06/17/22 0510 06/17/22 1246 06/17/22 1255 06/17/22 1300  BP: (!) 142/89 (!) 110/96 126/71 126/67  Pulse: (!) 113 (!) 115 (!) 112 (!) 109  Resp: 20 (!) _0 Temp: 97.7 F (36.5 C)     TempSrc: Oral     SpO2: 98% 98% 95% 96%  Weight:  Height:        Intake/Output Summary (Last 24 hours) at 06/17/2022 1307 Last data filed at 06/17/2022 0610 Gross per 24 hour  Intake 2422.12 ml  Output --  Net 2422.12 ml    Filed Weights   06/14/22 1534  Weight: 89.5 kg    Examination:  General exam: NAD Respiratory  system: BL air movement.  Cardiovascular system: S 1, S 2 RRR Gastrointestinal system: BS present, soft, nt Central nervous system: Alert.  Extremities: Symmetric 5 x 5 power.   Data Reviewed: I have personally reviewed following labs and imaging studies  CBC: Recent Labs  Lab 06/14/22 1633 06/16/22 0403 06/17/22 0336  WBC 9.4 11.9* 13.2*  NEUTROABS 4.8 8.5* 9.7*  HGB 9.0* 8.1* 7.9*  HCT 27.7* 24.7* 24.3*  MCV 82.2 82.3 82.4  PLT 167 164 242    Basic Metabolic Panel: Recent Labs  Lab 06/14/22 1633 06/15/22 0342 06/16/22 0403 06/17/22 0336  NA 135 137 138 138  K 4.1 3.9 3.7 3.8  CL 95* 101 106 106  CO2 _0 GLUCOSE 88 97 130* 132*  BUN 29* 31* 43* 49*  CREATININE 2.03* 2.07* 2.05* 2.10*  CALCIUM 13.8* 12.2* 10.6* 9.4    GFR: Estimated Creatinine Clearance: 29.9 mL/min (A) (by C-G formula based on SCr of 2.1 mg/dL (H)). Liver Function Tests: Recent Labs  Lab 06/14/22 1633 06/16/22 0403 06/17/22 0336  AST 39 25 23  ALT _1 ALKPHOS 86 74 73  BILITOT 1.6* 0.7 0.5  PROT 8.0 6.9 7.0  ALBUMIN 3.9 3.6 3.6    No results for input(s): "LIPASE", "AMYLASE" in the last 168 hours. No results for input(s): "AMMONIA" in the last 168 hours. Coagulation Profile: No results for input(s): "INR", "PROTIME" in the last 168 hours. Cardiac Enzymes: No results for input(s): "CKTOTAL", "CKMB", "CKMBINDEX", "TROPONINI" in the last 168 hours. BNP (last 3 results) No results for input(s): "PROBNP" in the last 8760 hours. HbA1C: No results for input(s): "HGBA1C" in the last 72 hours. CBG: No results for input(s): "GLUCAP" in the last 168 hours. Lipid Profile: No results for input(s): "CHOL", "HDL", "LDLCALC", "TRIG", "CHOLHDL", "LDLDIRECT" in the last 72 hours. Thyroid Function Tests: No results for input(s): "TSH", "T4TOTAL", "FREET4", "T3FREE", "THYROIDAB" in the last 72 hours. Anemia Panel: Recent Labs    06/15/22 1338  VITAMINB12 584    Sepsis  Labs: No results for input(s): "PROCALCITON", "LATICACIDVEN" in the last 168 hours.  No results found for this or any previous visit (from the past 240 hour(s)).       Radiology Studies: ECHOCARDIOGRAM COMPLETE  Result Date: 06/16/2022    ECHOCARDIOGRAM REPORT   Patient Name:   PORFIRIA HEINRICH [facility] Date of Exam: 06/16/2022 Medical Rec #:  [id number]    Height:       64.3 in Accession #:    [id number]   Weight:       197.4 lb Date of Birth:  21-Nov-1960    BSA:          1.951 m Patient Age:    41 years     BP:           143/80 mmHg Patient Gender: F            HR:           109 bpm. Exam Location:  Inpatient Procedure: 2D Echo, Cardiac Doppler, Color Doppler and Strain Analysis Indications:    R94.31 Abnormal EKG  History:  Patient has no prior history of Echocardiogram examinations.                 Abnormal ECG, Arrythmias:Tachycardia; Risk Factors:Hypertension                 and Dyslipidemia. Metastatic cancer.  Sonographer:    Roseanna Rainbow RDCS Referring Phys: [id number] [facility] A Julias Mould  Sonographer Comments: Image acquisition challenging due to patient body habitus. Global longitudinal strain was attempted. IMPRESSIONS  1. Left ventricular ejection fraction, by estimation, is 65 to 70%. The left ventricle has normal function. The left ventricle has no regional wall motion abnormalities. Indeterminate diastolic filling due to E-A fusion. The average left ventricular global longitudinal strain is -24.0 %. The global longitudinal strain is normal.  2. Right ventricular systolic function is normal. The right ventricular size is normal. There is mildly elevated pulmonary artery systolic pressure.  3. Large pleural effusion in the right lateral region.  4. The mitral valve is normal in structure. No evidence of mitral valve regurgitation. No evidence of mitral stenosis.  5. The aortic valve is tricuspid. Aortic valve regurgitation is not visualized. No aortic stenosis is present.  6. The inferior vena cava is normal  in size with <50% respiratory variability, suggesting right atrial pressure of 8 mmHg. Comparison(s): No prior Echocardiogram. FINDINGS  Left Ventricle: Left ventricular ejection fraction, by estimation, is 65 to 70%. The left ventricle has normal function. The left ventricle has no regional wall motion abnormalities. The average left ventricular global longitudinal strain is -24.0 %. The global longitudinal strain is normal. The left ventricular internal cavity size was normal in size. There is no left ventricular hypertrophy. Indeterminate diastolic filling due to E-A fusion. Right Ventricle: The right ventricular size is normal. No increase in right ventricular wall thickness. Right ventricular systolic function is normal. There is mildly elevated pulmonary artery systolic pressure. The tricuspid regurgitant velocity is 2.69  m/s, and with an assumed right atrial pressure of 8 mmHg, the estimated right ventricular systolic pressure is 61.2 mmHg. Left Atrium: Left atrial size was normal in size. Right Atrium: Right atrial size was normal in size. Pericardium: Trivial pericardial effusion is present. Mitral Valve: The mitral valve is normal in structure. No evidence of mitral valve regurgitation. No evidence of mitral valve stenosis. MV peak gradient, 9.7 mmHg. The mean mitral valve gradient is 4.0 mmHg. Tricuspid Valve: The tricuspid valve is normal in structure. Tricuspid valve regurgitation is not demonstrated. No evidence of tricuspid stenosis. Aortic Valve: The aortic valve is tricuspid. Aortic valve regurgitation is not visualized. No aortic stenosis is present. Pulmonic Valve: The pulmonic valve was normal in structure. Pulmonic valve regurgitation is not visualized. No evidence of pulmonic stenosis. Aorta: The aortic root and ascending aorta are structurally normal, with no evidence of dilitation. Venous: The inferior vena cava is normal in size with less than 50% respiratory variability, suggesting right  atrial pressure of 8 mmHg. IAS/Shunts: No atrial level shunt detected by color flow Doppler. Additional Comments: There is a large pleural effusion in the right lateral region.  LEFT VENTRICLE PLAX 2D LVIDd:         4.20 cm     Diastology LVIDs:         2.30 cm     LV e' medial:    3.73 cm/s LV PW:         1.20 cm     LV E/e' medial:  24.0 LV IVS:  1.00 cm     LV e' lateral:   11.20 cm/s LVOT diam:     1.90 cm     LV E/e' lateral: 8.0 LV SV:         71 LV SV Index:   36          2D Longitudinal Strain LVOT Area:     2.84 cm    2D Strain GLS Avg:     -24.0 %  LV Volumes (MOD) LV vol d, MOD A2C: 81.1 ml LV vol d, MOD A4C: 69.0 ml LV vol s, MOD A2C: 28.3 ml LV vol s, MOD A4C: 21.0 ml LV SV MOD A2C:     52.8 ml LV SV MOD A4C:     69.0 ml LV SV MOD BP:      50.4 ml RIGHT VENTRICLE             IVC RV S prime:     21.30 cm/s  IVC diam: 1.60 cm TAPSE (M-mode): 2.4 cm LEFT ATRIUM           Index        RIGHT ATRIUM           Index LA diam:      2.90 cm 1.49 cm/m   RA Area:     10.70 cm LA Vol (A2C): 17.5 ml 8.97 ml/m   RA Volume:   26.10 ml  13.38 ml/m LA Vol (A4C): 22.4 ml 11.48 ml/m  AORTIC VALVE LVOT Vmax:   135.00 cm/s LVOT Vmean:  88.900 cm/s LVOT VTI:    0.249 m  AORTA Ao Root diam: 3.20 cm Ao Asc diam:  3.30 cm MITRAL VALVE                TRICUSPID VALVE MV Area (PHT): 4.94 cm     TR Peak grad:   28.9 mmHg MV Area VTI:   3.43 cm     TR Vmax:        269.00 cm/s MV Peak grad:  9.7 mmHg MV Mean grad:  4.0 mmHg     SHUNTS MV Vmax:       1.56 m/s     Systemic VTI:  0.25 m MV Vmean:      93.2 cm/s    Systemic Diam: 1.90 cm MV Decel Time: 154 msec MV E velocity: 89.37 cm/s MV A velocity: 139.67 cm/s MV E/A ratio:  0.64 Rudean Haskell MD Electronically signed by Rudean Haskell MD Signature Date/Time: 06/16/2022/2:47:42 PM    Final    VAS [place] LOWER EXTREMITY VENOUS (DVT)  Result Date: 06/16/2022  Lower Venous DVT Study Patient Name:  TIMBERLEE ROBLERO [facility]  Date of Exam:   06/15/2022 Medical Rec #: [id number]      Accession #:    [id number] Date of Birth: 21-Aug-1961     Patient Gender: F Patient Age:   71 years Exam Location:  [facility] Procedure:      VAS [place] LOWER EXTREMITY VENOUS (DVT) Referring Phys: Jerald Kief Ariyanah Aguado --------------------------------------------------------------------------------  Indications: Pain.  Comparison Study: No previous exam noted. Performing Technologist: Bobetta Lime BS, RVT  Examination Guidelines: A complete evaluation includes B-mode imaging, spectral Doppler, color Doppler, and power Doppler as needed of all accessible portions of each vessel. Bilateral testing is considered an integral part of a complete examination. Limited examinations for reoccurring indications may be performed as noted. The reflux portion of the exam is performed with the patient in reverse Trendelenburg.  +---------+---------------+---------+-----------+----------+--------------+ RIGHT  CompressibilityPhasicitySpontaneityPropertiesThrombus Aging +---------+---------------+---------+-----------+----------+--------------+ CFV      Full           Yes      Yes                                 +---------+---------------+---------+-----------+----------+--------------+ SFJ      Full                                                        +---------+---------------+---------+-----------+----------+--------------+ FV Prox  Full                                                        +---------+---------------+---------+-----------+----------+--------------+ FV Mid   Full                                                        +---------+---------------+---------+-----------+----------+--------------+ FV DistalFull                                                        +---------+---------------+---------+-----------+----------+--------------+ PFV      Full                                                         +---------+---------------+---------+-----------+----------+--------------+ POP      Full           Yes      Yes                                 +---------+---------------+---------+-----------+----------+--------------+ PTV      Full                                                        +---------+---------------+---------+-----------+----------+--------------+ PERO     Full                                                        +---------+---------------+---------+-----------+----------+--------------+   +----+---------------+---------+-----------+----------+--------------+ LEFTCompressibilityPhasicitySpontaneityPropertiesThrombus Aging +----+---------------+---------+-----------+----------+--------------+ CFV Full           Yes      Yes                                 +----+---------------+---------+-----------+----------+--------------+  Summary: RIGHT: - No evidence of deep vein thrombosis in the lower extremity. No indirect evidence of obstruction proximal to the inguinal ligament. - No cystic structure found in the popliteal fossa.  LEFT: - No evidence of common femoral vein obstruction.  *See table(s) above for measurements and observations. Electronically signed by Orlie Pollen on 06/16/2022 at 10:42:18 AM.    Final         Scheduled Meds:  sodium chloride   Intravenous Once   cholecalciferol  1,000 Units Oral Daily   dexamethasone (DECADRON) injection  20 mg Intravenous Q24H   dronabinol  5 mg Oral BID AC   enoxaparin (LOVENOX) injection  30 mg Subcutaneous Q24H   famciclovir  500 mg Oral Daily   fentaNYL  1 patch Transdermal Q72H   fentaNYL       fluconazole  100 mg Oral Daily   furosemide  20 mg Intravenous Once   furosemide  20 mg Intravenous Once   HYDROmorphone  2 mg Oral Q6H   levothyroxine  88 mcg Oral Q0600   lidocaine       lidocaine-EPINEPHrine       metoprolol tartrate  25 mg Oral BID   midazolam       multivitamin with minerals  1  tablet Oral Daily   pantoprazole  40 mg Oral BID   polyethylene glycol  17 g Oral BID   protein supplement  1 Scoop Oral TID WC   rosuvastatin  10 mg Oral Daily   senna-docusate  1 tablet Oral BID   Continuous Infusions:  sodium chloride 100 mL/hr at 06/17/22 0610     LOS: 3 days    Time spent: 35 minutes    Marijose Curington A Esmirna Ravan, MD [facility]   If 7PM-7AM, please contact night-coverage [email or web address]  06/17/2022, 1:07 PM

## 2022-06-17 NOTE — Progress Notes (Signed)
[name] says that she feels better.  She will go for the Port-A-Cath today.  Over the weekend, she did have a chest x-ray.  This showed a moderate right pleural effusion.  I am unsure exactly what the etiology of this is.  We are going to have to do a thoracentesis on this.  I saw that  this was ordered.  I do not think this has been done yet.  Her hemoglobin is only 7.9.  She is going need to have a transfusion.  As such, we will set her up with 2 units of blood.  I talked her about this today.  I explained why I thought she needed the transfusion.  With her marginal renal function, I do still think she is going be able to mount a good erythropoietic response.  I will check an erythropoietin level on her.  Her labs today show white count 13.2.  Hemoglobin 7.9.  Platelet count 174,000.  Her sodium is 138.  Potassium 3.8.  BUN 49 creatinine 2.1.  Calcium is 9.4.  She has normal LFTs.  I am sure that her renal insufficiency is secondary to her light chains.  We are doing a 24-hour urine on her.  She had Dopplers of her legs done this weekend.  They were negative for any thromboembolic disease.  We really need to get chemotherapy started on her.  However, we need to get the blood into her.  We need to do the thoracentesis.  I cannot imagine that she has a second malignancy.  However, this is always a remote possibility that we have to look into.  The thoracentesis will tell us this.  She had an echocardiogram done over the weekend.  She has a very good ejection fraction of 65-70%.  She is not eating much.  She is not going to the bathroom.  We really need to get her to have a bowel movement.  Her thrush is gone.  As far as her pain, she says this is doing okay.  However, should she really out of bed.  We need to make sure that she continues her radiation therapy.  Her vital signs show temperature of 97.7.  Pulse 113.  Blood pressure 142/89.  Her lungs are clear bilaterally.  Cardiac exam  tachycardic but regular.  Oral exam does not show any thrush.  Abdomen is soft.  She is slightly obese.  She has decent bowel sounds.  There is no guarding or rebound tenderness.  Extremities show some mild edema.  There is no tenderness to palpation.  There is negative Homans' sign.  Neurological exam is nonfocal.  Michelle [name] has myeloma.  I would not think that she has any other malignancy.  However, is very unusual for myeloma to cause any kind of pleural effusion.  This does need to be checked.  It will be interesting to see what the cytogenetics and FISH panel is for her myeloma.  It would not surprise me if she has abnormal cytogenetics.  Again, today will be quite busy for her.  I know that she will get incredible care from all the staff up on [address].  [name], MD  Michaelyn Barter 2:10

## 2022-06-17 NOTE — Progress Notes (Signed)
Patient went to ED for pain control and was admitted. While hospitalized she will get port placed. Will continue to follow for post discharge needs and office follow up.  Oncology Nurse Navigator Documentation     06/17/2022    7:45 AM  Oncology Nurse Navigator Flowsheets  Navigator Follow Up Date: 06/19/2022  Navigator Follow Up Reason: Appointment Review  Navigator Location [facility]  Navigator Encounter Type Appt/Treatment Plan Review  Patient Visit Type MedOnc  Treatment Phase Active Tx  Barriers/Navigation Needs Coordination of Care;Education  Interventions None Required  Acuity Level 2-Minimal Needs (1-2 Barriers Identified)  Support Groups/Services Friends and Family  Time Spent with Patient 15

## 2022-06-17 NOTE — Progress Notes (Signed)
   06/17/22 2014  Assess: MEWS Score  Temp 98.2 F (36.8 C)  BP 137/75  MAP (mmHg) 92  Pulse Rate (!) 121  ECG Heart Rate (!) 120  Resp 17  Level of Consciousness Alert  SpO2 99 %  O2 Device Room Air  Patient Activity (if Appropriate) In bed  Assess: MEWS Score  MEWS Temp 0  MEWS Systolic 0  MEWS Pulse 2  MEWS RR 0  MEWS LOC 0  MEWS Score 2  MEWS Score Color Yellow  Assess: if the MEWS score is Yellow or Red  Were vital signs taken at a resting state? Yes  Focused Assessment No change from prior assessment  Does the patient meet 2 or more of the SIRS criteria? Yes  Does the patient have a confirmed or suspected source of infection? Yes  Provider and Rapid Response Notified? Yes  MEWS guidelines implemented *See Row Information* Yes  Treat  MEWS Interventions Administered scheduled meds/treatments (patient receiving a unit of blood, IV antibiotics and will get scheduled metoprolol)  Pain Scale 0-10  Pain Score 0  Take Vital Signs  Increase Vital Sign Frequency  Yellow: Q 2hr X 2 then Q 4hr X 2, if remains yellow, continue Q 4hrs  Escalate  MEWS: Escalate Yellow: discuss with charge nurse/RN and consider discussing with provider and RRT  Notify: Charge Nurse/RN  Name of Charge Nurse/RN Notified Karrie Doffing, RN  Date Charge Nurse/RN Notified 06/17/22  Time Charge Nurse/RN Notified 2102  Notify: Provider  Provider Name/Title Gershon Cull, NP  Date Provider Notified 06/17/22  Time Provider Notified 2032  Method of Notification Page  Notification Reason Other (Comment) (SIRS score of 2 for WBC and HR)  Provider response No new orders  Date of Provider Response 06/17/22  Time of Provider Response 2033  Notify: Rapid Response  Name of Rapid Response RN Notified Barnet Glasgow, RN (Rapid Response RN)  Date Rapid Response Notified 06/17/22  Time Rapid Response Notified 2100  Document  Progress note created (see row info) Yes  Assess: SIRS CRITERIA  SIRS Temperature   0  SIRS Pulse 1  SIRS Respirations  0  SIRS WBC 1  SIRS Score Sum  2   Will continue to monitor patient and let on call provider and Rapid Response RN know if there are any changes in patient's condition.

## 2022-06-17 NOTE — TOC Initial Note (Signed)
Transition of Care [facility]) - Initial/Assessment Note    Patient Details  Name: Michelle [name] MRN: [id number] Date of Birth: 1961-02-14  Transition of Care [facility]) CM/SW Contact:    Leeroy Cha, RN Phone Number: 06/17/2022, 8:41 AM  Clinical Narrative:                  Transition of Care [facility]) Screening Note   Patient Details  Name: Michelle [name] Date of Birth: Jan 16, 1961   Transition of Care [facility]) CM/SW Contact:    Leeroy Cha, RN Phone Number: 06/17/2022, 8:41 AM    Transition of Care Department [facility]) has reviewed patient and no TOC needs have been identified at this time. We will continue to monitor patient advancement through interdisciplinary progression rounds. If new patient transition needs arise, please place a TOC consult.    Expected Discharge Plan: Home/Self Care Barriers to Discharge: Continued Medical Work up   Patient Goals and CMS Choice Patient states their goals for this hospitalization and ongoing recovery are:: to go home CMS Medicare.gov Compare Post Acute Care list provided to:: Patient Choice offered to / list presented to : Patient  Expected Discharge Plan and Services Expected Discharge Plan: Home/Self Care   Discharge Planning Services: CM Consult   Living arrangements for the past 2 months: Apartment                                      Prior Living Arrangements/Services Living arrangements for the past 2 months: Apartment Lives with:: Self (widowed) Patient language and need for interpreter reviewed:: Yes Do you feel safe going back to the place where you live?: Yes            Criminal Activity/Legal Involvement Pertinent to Current Situation/Hospitalization: No - Comment as needed  Activities of Daily Living Home Assistive Devices/Equipment: Environmental consultant (specify type), Cane (specify quad or straight), Eyeglasses ADL Screening (condition at time of admission) Patient's cognitive ability adequate to safely complete daily  activities?: Yes Is the patient deaf or have difficulty hearing?: No Does the patient have difficulty seeing, even when wearing glasses/contacts?: No Does the patient have difficulty concentrating, remembering, or making decisions?: Yes (a little) Patient able to express need for assistance with ADLs?: Yes Does the patient have difficulty dressing or bathing?: Yes Independently performs ADLs?: No Communication: Independent Dressing (OT): Needs assistance Is this a change from baseline?: Pre-admission baseline Grooming: Needs assistance Is this a change from baseline?: Pre-admission baseline Feeding: Independent Bathing: Needs assistance Is this a change from baseline?: Pre-admission baseline Toileting: Needs assistance Is this a change from baseline?: Pre-admission baseline In/Out Bed: Needs assistance Is this a change from baseline?: Pre-admission baseline Walks in Home: Needs assistance Is this a change from baseline?: Pre-admission baseline Does the patient have difficulty walking or climbing stairs?: Yes Weakness of Legs: Both Weakness of Arms/Hands: None  Permission Sought/Granted                  Emotional Assessment Appearance:: Appears stated age Attitude/Demeanor/Rapport: Engaged Affect (typically observed): Calm Orientation: : Oriented to Place, Oriented to Self, Oriented to  Time, Oriented to Situation Alcohol / Substance Use: Tobacco Use (hx of use) Psych Involvement: No (comment)  Admission diagnosis:  Hypercalcemia [E83.52] AKI (acute kidney injury) ([place]) [N17.9] Intractable pain [R52] Acute bilateral low back pain with right-sided sciatica [M54.41] Patient Active Problem List   Diagnosis Date Noted  Intractable pain 06/14/2022   Hypercalcemia 06/14/2022   AKI (acute kidney injury) ([place]) 98/92/1194   Metabolic acidosis 17/40/8144   Tachycardia 06/14/2022   Metastasis to bone ([place]) 06/10/2022   Refractory anemia ([place]) 05/17/2022   Hyperlipidemia  02/16/2017   Morbid obesity ([place]) 02/16/2017   Essential hypertension 02/16/2017   Hypothyroidism 02/16/2017   PCP:  Leeroy Cha, MD Pharmacy:   [id number]Phone: [phone]: [phone] [place] [address]: [phone]: [phone] [id number] [place], [place]: [phone]: [phone]   Social Determinants of Health (SDOH) Interventions    Readmission Risk Interventions     No data to display

## 2022-06-17 NOTE — Procedures (Signed)
Vascular and Interventional Radiology Procedure Note  Patient: Michelle [name] DOB: 20-Mar-1961 Medical Record Number: [id number] Note Date/Time: 06/17/22 2:15 PM   Performing Physician: Michaelle Birks, MD Assistant(s): None  Diagnosis:  Myeloma  and Pleural effusion  Procedure:  THORACENTESIS, DIAGNOSTIC and THERAPEUTIC  PORT PLACEMENT  Anesthesia: Conscious Sedation Complications: None Estimated Blood Loss: Minimal Specimens:  Sent Cytology  Findings:  The RIGHT chest was accessed with a 30F Pigtail catheter, and 1200 mL of fluid was obtained.  Successful LEFT-sided port placement, with the tip of the catheter in the proximal right atrium.  Plan: Catheter ready for use.  See detailed procedure note with images in PACS. The patient tolerated the procedure well without incident or complication and was returned to Floor Bed in stable condition.    Michaelle Birks, MD Vascular and Interventional Radiology Specialists [facility] Radiology   Pager. [phone]

## 2022-06-18 ENCOUNTER — Inpatient Hospital Stay: Payer: BC Managed Care – PPO | Admitting: Hematology & Oncology

## 2022-06-18 ENCOUNTER — Ambulatory Visit
Admission: RE | Admit: 2022-06-18 | Discharge: 2022-06-18 | Disposition: A | Discharge status: Home / Self Care | Payer: BC Managed Care – PPO | Source: Ambulatory Visit | Attending: Radiation Oncology | Admitting: Radiation Oncology

## 2022-06-18 ENCOUNTER — Inpatient Hospital Stay (HOSPITAL_COMMUNITY): Payer: BC Managed Care – PPO

## 2022-06-18 ENCOUNTER — Encounter (HOSPITAL_COMMUNITY): Payer: Self-pay | Admitting: Hematology & Oncology

## 2022-06-18 ENCOUNTER — Inpatient Hospital Stay: Payer: BC Managed Care – PPO

## 2022-06-18 ENCOUNTER — Other Ambulatory Visit: Payer: Self-pay

## 2022-06-18 DIAGNOSIS — R52 Pain, unspecified: Secondary | ICD-10-CM | POA: Diagnosis not present

## 2022-06-18 LAB — BPAM RBC
Blood Product Expiration Date: 202309012359
Blood Product Expiration Date: 202309022359
ISSUE DATE / TIME: 202308141532
ISSUE DATE / TIME: 202308142019
Unit Type and Rh: 6200
Unit Type and Rh: 6200

## 2022-06-18 LAB — RAD ONC ARIA SESSION SUMMARY
Course Elapsed Days: 5
Plan Fractions Treated to Date: 4
Plan Prescribed Dose Per Fraction: 2 Gy
Plan Total Fractions Prescribed: 10
Plan Total Prescribed Dose: 20 Gy
Reference Point Dosage Given to Date: 8 Gy
Reference Point Session Dosage Given: 2 Gy
Session Number: 4

## 2022-06-18 LAB — CBC WITH DIFFERENTIAL/PLATELET
Abs Immature Granulocytes: 0.13 10*3/uL — ABNORMAL HIGH (ref 0.00–0.07)
Basophils Absolute: 0 10*3/uL (ref 0.0–0.1)
Basophils Relative: 0 %
Eosinophils Absolute: 0 10*3/uL (ref 0.0–0.5)
Eosinophils Relative: 0 %
HCT: 32.4 % — ABNORMAL LOW (ref 36.0–46.0)
Hemoglobin: 10.8 g/dL — ABNORMAL LOW (ref 12.0–15.0)
Immature Granulocytes: 1 %
Lymphocytes Relative: 6 %
Lymphs Abs: 0.7 10*3/uL (ref 0.7–4.0)
MCH: 27.6 pg (ref 26.0–34.0)
MCHC: 33.3 g/dL (ref 30.0–36.0)
MCV: 82.7 fL (ref 80.0–100.0)
Monocytes Absolute: 2.8 10*3/uL — ABNORMAL HIGH (ref 0.1–1.0)
Monocytes Relative: 22 %
Neutro Abs: 8.8 10*3/uL — ABNORMAL HIGH (ref 1.7–7.7)
Neutrophils Relative %: 71 %
Platelets: 176 10*3/uL (ref 150–400)
RBC: 3.92 MIL/uL (ref 3.87–5.11)
RDW: 17 % — ABNORMAL HIGH (ref 11.5–15.5)
WBC: 12.5 10*3/uL — ABNORMAL HIGH (ref 4.0–10.5)
nRBC: 0 % (ref 0.0–0.2)

## 2022-06-18 LAB — COMPREHENSIVE METABOLIC PANEL
ALT: 25 U/L (ref 0–44)
AST: 29 U/L (ref 15–41)
Albumin: 3.6 g/dL (ref 3.5–5.0)
Alkaline Phosphatase: 75 U/L (ref 38–126)
Anion gap: 9 (ref 5–15)
BUN: 49 mg/dL — ABNORMAL HIGH (ref 8–23)
CO2: 22 mmol/L (ref 22–32)
Calcium: 9.5 mg/dL (ref 8.9–10.3)
Chloride: 111 mmol/L (ref 98–111)
Creatinine, Ser: 2.47 mg/dL — ABNORMAL HIGH (ref 0.44–1.00)
GFR, Estimated: 21 mL/min — ABNORMAL LOW (ref >60)
Glucose, Bld: 122 mg/dL — ABNORMAL HIGH (ref 70–99)
Potassium: 4 mmol/L (ref 3.5–5.1)
Sodium: 142 mmol/L (ref 135–145)
Total Bilirubin: 0.7 mg/dL (ref 0.3–1.2)
Total Protein: 7.1 g/dL (ref 6.5–8.1)

## 2022-06-18 LAB — TYPE AND SCREEN
ABO/RH(D): A POS
Antibody Screen: NEGATIVE
Unit division: 0
Unit division: 0

## 2022-06-18 LAB — HEPATIC FUNCTION PANEL
ALT: 26 U/L (ref 0–44)
AST: 29 U/L (ref 15–41)
Albumin: 3.7 g/dL (ref 3.5–5.0)
Alkaline Phosphatase: 77 U/L (ref 38–126)
Bilirubin, Direct: 0.1 mg/dL (ref 0.0–0.2)
Indirect Bilirubin: 0.6 mg/dL (ref 0.3–0.9)
Total Bilirubin: 0.7 mg/dL (ref 0.3–1.2)
Total Protein: 7.2 g/dL (ref 6.5–8.1)

## 2022-06-18 LAB — TSH: TSH: 2.908 u[IU]/mL (ref 0.350–4.500)

## 2022-06-18 LAB — LACTATE DEHYDROGENASE: LDH: 204 U/L — ABNORMAL HIGH (ref 98–192)

## 2022-06-18 MED ORDER — LACTULOSE 10 GM/15ML PO SOLN
20.0000 g | Freq: Three times a day (TID) | ORAL | Status: DC
  Administered 2022-06-18 – 2022-06-21 (×5): 20 g via ORAL
  Filled 2022-06-18 (×7): qty 30

## 2022-06-18 MED ORDER — SODIUM CHLORIDE 0.9% FLUSH: 10.0000 mL | INTRAVENOUS | Status: DC | PRN

## 2022-06-18 MED ORDER — CHLORHEXIDINE GLUCONATE CLOTH 2 % EX PADS
6.0000 | MEDICATED_PAD | Freq: Every day | CUTANEOUS | Status: DC
  Administered 2022-06-18 – 2022-06-26 (×8): 6 via TOPICAL

## 2022-06-18 NOTE — Progress Notes (Signed)
Michelle [name] had a very busy day yesterday.  She had the Port-A-Cath placed.  She had a right thoracentesis.  500 cc of fluid was removed.  This is a lot more than I would have thought.  I must say that rarely, rarely do you ever see myeloma because pleural effusions.  We will be incredibly interesting to see what the cytology shows.  I have to suspect that this can be an exudative effusion.  She did have 2 units of blood yesterday.  She does feel a little bit better.  She got radiation therapy yesterday.  Thankfully, the thrush is gone.  We used Diflucan for this.  She still says her pain is doing well.  Hopefully, she will be able to get out of bed and we see how well she ambulates.  Her labs today show a BUN of 49 creatinine 2.47.  Calcium is 9.5 with an albumin of 3.7.  Her white cell count is 12.5.  Hemoglobin 10.8.  Platelet count 176,000.  She had a 24-hour urine done.  For some reason, I do not know if the appropriate test were done.  I am unsure exactly what the results mean.  We really need to have a 24-hour urine on her.  Again, I just wonder if there may be a second malignancy.  Again, I cannot remember the last time that a patient had a pleural effusion with myeloma.  Typically, it is an incredibly aggressive myeloma that is in the final stages of disease.  She had the echocardiogram so this showed a good ejection fraction.  Hopefully, we will not have to repeat the 24-hour urine.  Again, I see all these results is a did not perform.  I am not sure exactly what this means.  I would like to think that the right test was done.  She had 1000 cc of urine.  Again, I do not know why I said insufficient specimen.  As such, we will have to repeat the 24-hour urine.  Hopefully, she will have a bowel movement today.  We may have to give her some lactulose for her to go to the bathroom.  She is not eating all that much.  Again some of this has to do the fact that she has some constipation.  I  really would like to get some chemotherapy started on her.   [name], MD  2 [place] 20:15

## 2022-06-18 NOTE — Progress Notes (Addendum)
PROGRESS NOTE    Michelle [name]  YKG:[id number] DOB: 08-14-1961 DOA: 06/14/2022 PCP: Lorenda Ishihara, MD   Brief Narrative: 61 year old with past medical history significant for bony metastasis likely secondary to multiple myeloma, pathology pending, hypertension, hypothyroidism, hyperlipidemia who presented with worsening intractable back pain.  She has had worsening back pain due to her metastatic disease.  MRI 8/3 showed worsening widespread tumor infiltrate with new extraosseous tumor in the ventral epidural space.  She has been on fentanyl and hydromorphone for pain management.  She is started radiation therapy 8/10.  Patient admitted with uncontrolled pain, AKI, hypercalcemia.  Found to have  moderate right pleural effusion that appears to be loculated. Underwent  Port-A-Cath placement and thoracentesis 8/14   Assessment & Plan:   Principal Problem:   Intractable pain Active Problems:   Essential hypertension   Hypothyroidism   Metastasis to bone (HCC)   Hypercalcemia   AKI (acute kidney injury) (HCC)   Metabolic acidosis   Tachycardia   1-Intractable pain widespread tumor infiltration with new extraosseous tumors in the ventral epidural space. MRI 8/03. -Pathology Bone marrow Bx; hypercellular bone marrow with plasma cell neoplasm. -Free kappa light chain 1.153.  M spike 1.8 -Dr. Myna Hidalgo primary oncologist -Started radiation treatment 8/10 under the care of Dr. Kathrynn Running. -Continue with Decadron 20 mg IV daily.  -Fentanyl patch increased from 12.5 to 25 mcg -Continue with oral hydromorphone as needed, -On  IV Dilaudid PRN -Underwent  Port cath placement  8/14 Undergoing evaluation of pleural effusion   2-Tachycardia: Continue with IV fluids.  VQ scan. No convincing evidence of PE>  Started  low dose metoprolol  Improved.   Moderate right Pleural effusion: Exudative Underwent Thoracenteses 8/14 yielding 1.2 L fluid.  ECHO: Ejection fraction 65 to 70%  indeterminate diastolic filling pressure. Started  IV Ceftriaxone and Zithromax, Protein ration 0.6, LDH ratio 1.6, WBC 16,000 Follow pleural fluid culture: no growth , Cytology Pulmonology consulted.  Repeated chest x ray 8/15: Near complete resolution of right effusion, no pneumothorax.  Anion gap metabolic acidosis: In the setting of AKI Treated with IV fluids.  Resolved.  AKI: in setting of MM.  Present with a creatinine of 2.0, baseline 1.0 In setting of MM.  Cr increase to 2.4, will increase IV fluids.   Hypercalcemia: Presents with calcium at 13.8. Of malignancy Received IV Zometa  Continue with calcitonin Ca down to 10.   Hypothyroidism: Continue with Synthroid  Hypertension: Agree with holding lisinopril and hydrochlorothiazide due to AKI and hypercalcemia  GERD; PPI BID  Anemia of Malignancy.  Related to MM>  Received 2 units PRBC 8/14  Constipation; started on Miralax and senna.  Estimated body mass index is 33.62 kg/m as calculated from the following:   Height as of this encounter: 5' 4.25" (1.632 m).   Weight as of this encounter: 89.5 kg.   DVT prophylaxis: Lovenox Code Status: Full code Family Communication: family at bedside. 8/14 Disposition Plan:  Status is: Inpatient Remains inpatient appropriate because: Management of uncontrolled pain, hypercalcemia, AKI.    Consultants:  Oncology   Procedures:    Antimicrobials:    Subjective: She came from radiation. Denies cough, denies dyspnea.    Objective: Vitals:   06/17/22 2039 06/17/22 2253 06/17/22 2339 06/18/22 0449  BP: 120/74 121/72 129/78 119/70  Pulse: (!) 124 (!) 110 (!) 104 (!) 105  Resp: 20 19 16    Temp: 97.7 F (36.5 C) 98.1 F (36.7 C) 97.7 F (36.5 C) 98 F (36.7 C)  TempSrc: Oral Oral Oral Oral  SpO2: 97% 98% 97% 95%  Weight:      Height:        Intake/Output Summary (Last 24 hours) at 06/18/2022 0748 Last data filed at 06/18/2022 0200 Gross per 24 hour  Intake  2168.91 ml  Output 900 ml  Net 1268.91 ml    Filed Weights   06/14/22 1534  Weight: 89.5 kg    Examination:  General exam: NAD Respiratory system: CTA Cardiovascular system: S 1, S 2 RRR Gastrointestinal system: BS present, soft, nt Central nervous system: Alert Extremities:no edema   Data Reviewed: I have personally reviewed following labs and imaging studies  CBC: Recent Labs  Lab 06/14/22 1633 06/16/22 0403 06/17/22 0336 06/18/22 0134  WBC 9.4 11.9* 13.2* 12.5*  NEUTROABS 4.8 8.5* 9.7* 8.8*  HGB 9.0* 8.1* 7.9* 10.8*  HCT 27.7* 24.7* 24.3* 32.4*  MCV 82.2 82.3 82.4 82.7  PLT 167 164 174 789    Basic Metabolic Panel: Recent Labs  Lab 06/14/22 1633 06/15/22 0342 06/16/22 0403 06/17/22 0336 06/18/22 0134  NA 135 137 138 138 142  K 4.1 3.9 3.7 3.8 4.0  CL 95* 101 106 106 111  CO2 $Re'24 25 22 22 22  'ESk$ GLUCOSE 88 97 130* 132* 122*  BUN 29* 31* 43* 49* 49*  CREATININE 2.03* 2.07* 2.05* 2.10* 2.47*  CALCIUM 13.8* 12.2* 10.6* 9.4 9.5    GFR: Estimated Creatinine Clearance: 25.4 mL/min (A) (by C-G formula based on SCr of 2.47 mg/dL (H)). Liver Function Tests: Recent Labs  Lab 06/14/22 1633 06/16/22 0403 06/17/22 0336 06/18/22 0134  AST 39 $Remo'25 23 29  29  'RpLvo$ ALT $Rem'30 24 20 26  25  'Pjsv$ ALKPHOS 86 74 73 77  75  BILITOT 1.6* 0.7 0.5 0.7  0.7  PROT 8.0 6.9 7.0 7.2  7.1  ALBUMIN 3.9 3.6 3.6 3.7  3.6    No results for input(s): "LIPASE", "AMYLASE" in the last 168 hours. No results for input(s): "AMMONIA" in the last 168 hours. Coagulation Profile: No results for input(s): "INR", "PROTIME" in the last 168 hours. Cardiac Enzymes: No results for input(s): "CKTOTAL", "CKMB", "CKMBINDEX", "TROPONINI" in the last 168 hours. BNP (last 3 results) No results for input(s): "PROBNP" in the last 8760 hours. HbA1C: No results for input(s): "HGBA1C" in the last 72 hours. CBG: Recent Labs  Lab 06/17/22 1629  GLUCAP 138*   Lipid Profile: No results for input(s):  "CHOL", "HDL", "LDLCALC", "TRIG", "CHOLHDL", "LDLDIRECT" in the last 72 hours. Thyroid Function Tests: No results for input(s): "TSH", "T4TOTAL", "FREET4", "T3FREE", "THYROIDAB" in the last 72 hours. Anemia Panel: Recent Labs    06/15/22 1338  VITAMINB12 584    Sepsis Labs: No results for input(s): "PROCALCITON", "LATICACIDVEN" in the last 168 hours.  Recent Results (from the past 240 hour(s))  Body fluid culture w Gram Stain     Status: None (Preliminary result)   Collection Time: 06/17/22 12:30 PM   Specimen: PATH Cytology Pleural fluid  Result Value Ref Range Status   Specimen Description   Final    PLEURAL Performed at [facility] [address] [place], [place] [address]    Special Requests   Final    NONE Performed at [facility], [place] [address], [place], [place] [address]    Gram Stain   Final    ABUNDANT WBC PRESENT, PREDOMINANTLY MONONUCLEAR NO ORGANISMS SEEN Performed at [facility], [place] [address] [place], [place] [address]  Culture PENDING  Incomplete   Report Status PENDING  Incomplete         Radiology Studies: IR IMAGING GUIDED PORT INSERTION  Result Date: 06/17/2022 INDICATION: Myeloma. EXAM: IMPLANTED PORT A CATH PLACEMENT WITH ULTRASOUND AND FLUOROSCOPIC GUIDANCE MEDICATIONS: None ANESTHESIA/SEDATION: Moderate (conscious) sedation was employed during this procedure. A total of Versed 2 mg and Fentanyl 100 mcg was administered intravenously. Moderate Sedation Time: 49 minutes. The patient's level of consciousness and vital signs were monitored continuously by radiology nursing throughout the procedure under my direct supervision. FLUOROSCOPY TIME:  Fluoroscopic dose; 2 mGy COMPLICATIONS: None immediate. PROCEDURE: The procedure, risks, benefits, and alternatives were explained to the the patient and/or patient's representative . Questions regarding the procedure were encouraged and answered. The patient  understands and consents to the procedure. The LEFT neck and chest were prepped with chlorhexidine in a sterile fashion, and a sterile drape was applied covering the operative field. Maximum barrier sterile technique with sterile gowns and gloves were used for the procedure. A timeout was performed prior to the initiation of the procedure. Local anesthesia was provided with 1% lidocaine with epinephrine. After creating a small venotomy incision, a micropuncture kit was utilized to access the internal jugular vein under direct, real-time ultrasound guidance. Ultrasound image documentation was performed. The microwire was kinked to measure appropriate catheter length. A subcutaneous port pocket was then created along the upper chest wall utilizing a combination of sharp and blunt dissection. The pocket was irrigated with sterile saline. A single lumen ISP power injectable port was chosen for placement. The 8 Fr catheter was tunneled from the port pocket site to the venotomy incision. The port was placed in the pocket. The external catheter was trimmed to appropriate length. At the venotomy, an 8 Fr peel-away sheath was placed over a guidewire under fluoroscopic guidance. The catheter was then placed through the sheath and the sheath was removed. Final catheter positioning was confirmed and documented with a fluoroscopic spot radiograph. The port was accessed with a Huber needle, aspirated and flushed with heparinized saline. The port pocket incision was closed with interrupted 3-0 Vicryl suture then Dermabond was applied, including at the venotomy incision. Dressings were placed. The patient tolerated the procedure well without immediate post procedural complication. IMPRESSION: Successful placement of a LEFT internal jugular approach power injectable Port-A-Cath. The tip of the catheter is positioned within the proximal RIGHT atrium. The catheter is ready for immediate use. Michaelle Birks, MD [facility] Radiology Electronically Signed   By: Michaelle Birks M.D.   On: 06/17/2022 17:15   IR THORACENTESIS ASP PLEURAL SPACE W/IMG GUIDE  Result Date: 06/17/2022 INDICATION: RIGHT pleural effusion EXAM: ULTRASOUND GUIDED RIGHT THORACENTESIS MEDICATIONS: None. COMPLICATIONS: None immediate. PROCEDURE: An ultrasound guided thoracentesis was thoroughly discussed with the patient and questions answered. The benefits, risks, alternatives and complications were also discussed. The patient understands and wishes to proceed with the procedure. Written consent was obtained. Ultrasound was performed to localize and mark an adequate pocket of fluid in the RIGHT chest. The area was then prepped and draped in the normal sterile fashion. 1% Lidocaine was used for local anesthesia. Under ultrasound guidance a 8 Fr Safe-T-Centesis catheter was introduced. Thoracentesis was performed. The catheter was removed and a dressing applied. FINDINGS: 1. A total of approximately 1.2 L of serosanguineous pleural fluid was removed. Samples were sent to the laboratory as requested by the clinical team. 2. An intra procedural XR of the RIGHT chest was NEGATIVE  for pneumothorax. IMPRESSION: Successful ultrasound guided RIGHT diagnostic and therapeutic thoracentesis yielding 1.2 L of pleural fluid. Michaelle Birks, MD [facility] Radiology Electronically Signed   By: Michaelle Birks M.D.   On: 06/17/2022 17:12   ECHOCARDIOGRAM COMPLETE  Result Date: 06/16/2022    ECHOCARDIOGRAM REPORT   Patient Name:   KAELAN EMAMI [facility] Date of Exam: 06/16/2022 Medical Rec #:  [id number]    Height:       64.3 in Accession #:    [id number]   Weight:       197.4 lb Date of Birth:  1961-05-07    BSA:          1.951 m Patient Age:    69 years     BP:           143/80 mmHg Patient Gender: F            HR:           109 bpm. Exam Location:  Inpatient Procedure: 2D Echo, Cardiac Doppler, Color Doppler and  Strain Analysis Indications:    R94.31 Abnormal EKG  History:        Patient has no prior history of Echocardiogram examinations.                 Abnormal ECG, Arrythmias:Tachycardia; Risk Factors:Hypertension                 and Dyslipidemia. Metastatic cancer.  Sonographer:    Roseanna Rainbow RDCS Referring Phys: [id number] [facility] A Tharon Kitch  Sonographer Comments: Image acquisition challenging due to patient body habitus. Global longitudinal strain was attempted. IMPRESSIONS  1. Left ventricular ejection fraction, by estimation, is 65 to 70%. The left ventricle has normal function. The left ventricle has no regional wall motion abnormalities. Indeterminate diastolic filling due to E-A fusion. The average left ventricular global longitudinal strain is -24.0 %. The global longitudinal strain is normal.  2. Right ventricular systolic function is normal. The right ventricular size is normal. There is mildly elevated pulmonary artery systolic pressure.  3. Large pleural effusion in the right lateral region.  4. The mitral valve is normal in structure. No evidence of mitral valve regurgitation. No evidence of mitral stenosis.  5. The aortic valve is tricuspid. Aortic valve regurgitation is not visualized. No aortic stenosis is present.  6. The inferior vena cava is normal in size with <50% respiratory variability, suggesting right atrial pressure of 8 mmHg. Comparison(s): No prior Echocardiogram. FINDINGS  Left Ventricle: Left ventricular ejection fraction, by estimation, is 65 to 70%. The left ventricle has normal function. The left ventricle has no regional wall motion abnormalities. The average left ventricular global longitudinal strain is -24.0 %. The global longitudinal strain is normal. The left ventricular internal cavity size was normal in size. There is no left ventricular hypertrophy. Indeterminate diastolic filling due to E-A fusion. Right Ventricle: The right ventricular size is normal. No increase in right ventricular  wall thickness. Right ventricular systolic function is normal. There is mildly elevated pulmonary artery systolic pressure. The tricuspid regurgitant velocity is 2.69  m/s, and with an assumed right atrial pressure of 8 mmHg, the estimated right ventricular systolic pressure is 37.1 mmHg. Left Atrium: Left atrial size was normal in size. Right Atrium: Right atrial size was normal in size. Pericardium: Trivial pericardial effusion is present. Mitral Valve: The mitral valve is normal in structure. No evidence of mitral valve regurgitation. No evidence of mitral valve stenosis. MV peak  gradient, 9.7 mmHg. The mean mitral valve gradient is 4.0 mmHg. Tricuspid Valve: The tricuspid valve is normal in structure. Tricuspid valve regurgitation is not demonstrated. No evidence of tricuspid stenosis. Aortic Valve: The aortic valve is tricuspid. Aortic valve regurgitation is not visualized. No aortic stenosis is present. Pulmonic Valve: The pulmonic valve was normal in structure. Pulmonic valve regurgitation is not visualized. No evidence of pulmonic stenosis. Aorta: The aortic root and ascending aorta are structurally normal, with no evidence of dilitation. Venous: The inferior vena cava is normal in size with less than 50% respiratory variability, suggesting right atrial pressure of 8 mmHg. IAS/Shunts: No atrial level shunt detected by color flow Doppler. Additional Comments: There is a large pleural effusion in the right lateral region.  LEFT VENTRICLE PLAX 2D LVIDd:         4.20 cm     Diastology LVIDs:         2.30 cm     LV e' medial:    3.73 cm/s LV PW:         1.20 cm     LV E/e' medial:  24.0 LV IVS:        1.00 cm     LV e' lateral:   11.20 cm/s LVOT diam:     1.90 cm     LV E/e' lateral: 8.0 LV SV:         71 LV SV Index:   36          2D Longitudinal Strain LVOT Area:     2.84 cm    2D Strain GLS Avg:     -24.0 %  LV Volumes (MOD) LV vol d, MOD A2C: 81.1 ml LV vol d, MOD A4C: 69.0 ml LV vol s, MOD A2C: 28.3 ml LV  vol s, MOD A4C: 21.0 ml LV SV MOD A2C:     52.8 ml LV SV MOD A4C:     69.0 ml LV SV MOD BP:      50.4 ml RIGHT VENTRICLE             IVC RV S prime:     21.30 cm/s  IVC diam: 1.60 cm TAPSE (M-mode): 2.4 cm LEFT ATRIUM           Index        RIGHT ATRIUM           Index LA diam:      2.90 cm 1.49 cm/m   RA Area:     10.70 cm LA Vol (A2C): 17.5 ml 8.97 ml/m   RA Volume:   26.10 ml  13.38 ml/m LA Vol (A4C): 22.4 ml 11.48 ml/m  AORTIC VALVE LVOT Vmax:   135.00 cm/s LVOT Vmean:  88.900 cm/s LVOT VTI:    0.249 m  AORTA Ao Root diam: 3.20 cm Ao Asc diam:  3.30 cm MITRAL VALVE                TRICUSPID VALVE MV Area (PHT): 4.94 cm     TR Peak grad:   28.9 mmHg MV Area VTI:   3.43 cm     TR Vmax:        269.00 cm/s MV Peak grad:  9.7 mmHg MV Mean grad:  4.0 mmHg     SHUNTS MV Vmax:       1.56 m/s     Systemic VTI:  0.25 m MV Vmean:      93.2 cm/s    Systemic Diam: 1.90  cm MV Decel Time: 154 msec MV E velocity: 89.37 cm/s MV A velocity: 139.67 cm/s MV E/A ratio:  0.64 Rudean Haskell MD Electronically signed by Rudean Haskell MD Signature Date/Time: 06/16/2022/2:47:42 PM    Final         Scheduled Meds:  Chlorhexidine Gluconate Cloth  6 each Topical Daily   cholecalciferol  1,000 Units Oral Daily   dexamethasone (DECADRON) injection  20 mg Intravenous Q24H   dronabinol  5 mg Oral BID AC   enoxaparin (LOVENOX) injection  30 mg Subcutaneous Q24H   famciclovir  500 mg Oral Daily   fentaNYL  1 patch Transdermal Q72H   fluconazole  100 mg Oral Daily   HYDROmorphone  2 mg Oral Q6H   lactulose  20 g Oral TID   levothyroxine  88 mcg Oral Q0600   metoprolol tartrate  25 mg Oral BID   multivitamin with minerals  1 tablet Oral Daily   pantoprazole  40 mg Oral BID   polyethylene glycol  17 g Oral BID   protein supplement  1 Scoop Oral TID WC   rosuvastatin  10 mg Oral Daily   senna-docusate  1 tablet Oral BID   Continuous Infusions:  sodium chloride 50 mL/hr at 06/17/22 1852   azithromycin 500  mg (06/17/22 2020)   cefTRIAXone (ROCEPHIN)  IV 2 g (06/17/22 1855)     LOS: 4 days    Time spent: 35 minutes    Pavan Bring A Sary Bogie, MD [facility]   If 7PM-7AM, please contact night-coverage [email or web address]  06/18/2022, 7:48 AM

## 2022-06-18 NOTE — Plan of Care (Signed)

## 2022-06-18 NOTE — Consult Note (Signed)
NAME:  Michelle [name], MRN:  [id number], DOB:  1961-06-13, LOS: 4 ADMISSION DATE:  06/14/2022, CONSULTATION DATE:  06/18/2022 REFERRING MD:  Dr. Tyrell Antonio - [facility], CHIEF COMPLAINT:  Pleural effusion    History of Present Illness:  Michelle [name] is a 61 year old female with a past medical history significant for bony metastasis likely secondary to multiple myeloma hypertension, hyperlipidemia, and hypothyroidism who presented to the emergency department 8/11 for intractable back pain secondary to bony metastasis.  Note patient underwent MRI 8/3 which revealed worsening widespread tumor infiltration new extraosseous tumors ventral epidural space.  First session of palliative radiation was 8/10  On ED arrival patient was seen hemodynamically stable with tachycardia.  Pertinent lab work included AKI with a creatinine up to 2,  hypercalcemia, and elevated total bilirubin.  8/12 patient had chest x-ray performed which revealed partially loculated moderate right pleural effusion.  Interventional radiology was consulted 8/14 and patient underwent diagnostic and therapeutic right thoracentesis with 1.2 L of fluid removed.  8/15 PCCM consulted for further management of pleural effusion.  Pertinent  Medical History  Bony metastasis likely secondary to multiple myeloma hypertension, hyperlipidemia, and hypothyroidism   Significant Hospital Events: Including procedures, antibiotic start and stop dates in addition to other pertinent events   8/11 presented for intractable pain in the setting of bony metastasis  8/12 patient had chest x-ray performed which revealed partially loculated moderate right pleural effusion.   8/14 Interventional radiology was consulted  and patient underwent diagnostic and therapeutic right thoracentesis with 1.2 L of fluid removed.   8/15 PCCM consulted for further management of pleural effusion.  Interim History / Subjective:  Elderly lady, does not appear to be in distress Denies  significant shortness of breath Does have some rib cage pain and pain in the back  Objective   Blood pressure (!) 149/80, pulse (!) 112, temperature 98 F (36.7 C), temperature source Tympanic, resp. rate 16, height 5' 4.25" (1.632 m), weight 89.5 kg, SpO2 99 %.        Intake/Output Summary (Last 24 hours) at 06/18/2022 1005 Last data filed at 06/18/2022 4098 Gross per 24 hour  Intake 2368.91 ml  Output 1150 ml  Net 1218.91 ml   Filed Weights   06/14/22 1534  Weight: 89.5 kg    Examination: General: Chronically ill appearing elderly does not appear to be in acute distress HEENT: ETT, MM pink/moist, PERRL,  Neuro: Alert and oriented x3, moving all extremities CV: s1s2 regular rate and rhythm, no murmur, rubs, or gallops,  PULM: Clear to auscultation GI: soft, bowel sounds active in all 4 quadrants, non-tender, non-distended Extremities: warm/dry, no edema  Skin: no rashes or lesions  Resolved Hospital Problem list     Assessment & Plan:  Loculated right pleural effusion  -8/12 patient had chest x-ray performed which revealed partially loculated moderate right pleural effusion.  Interventional radiology was consulted 8/14 and patient underwent diagnostic and therapeutic right thoracentesis with 1.2 L of serosanguineous fluid removed.   -Pleural LDH 332, effusion exudative by Lights criteria  P: Pleural fluid cytology pending  Repeat CXR this am  Continue empiric antibiotics  Head of bed elevated 30 degrees. Ensure adequate pulmonary hygiene  Mobilize as able    Repeat x-ray was noted to be significantly improved with resolution of previous pleural effusion Follow-up on cytology Follow-up on cultures  Antibiotics can be de-escalated if no ongoing signs of infection We will follow  Best Practice (right click and "Reselect all SmartList Selections"  daily)  Per primary   Labs   CBC: Recent Labs  Lab 06/14/22 1633 06/16/22 0403 06/17/22 0336 06/18/22 0134  WBC  9.4 11.9* 13.2* 12.5*  NEUTROABS 4.8 8.5* 9.7* 8.8*  HGB 9.0* 8.1* 7.9* 10.8*  HCT 27.7* 24.7* 24.3* 32.4*  MCV 82.2 82.3 82.4 82.7  PLT 167 164 174 034    Basic Metabolic Panel: Recent Labs  Lab 06/14/22 1633 06/15/22 0342 06/16/22 0403 06/17/22 0336 06/18/22 0134  NA 135 137 138 138 142  K 4.1 3.9 3.7 3.8 4.0  CL 95* 101 106 106 111  CO2 $Re'24 25 22 22 22  'TsL$ GLUCOSE 88 97 130* 132* 122*  BUN 29* 31* 43* 49* 49*  CREATININE 2.03* 2.07* 2.05* 2.10* 2.47*  CALCIUM 13.8* 12.2* 10.6* 9.4 9.5   GFR: Estimated Creatinine Clearance: 25.4 mL/min (A) (by C-G formula based on SCr of 2.47 mg/dL (H)). Recent Labs  Lab 06/14/22 1633 06/16/22 0403 06/17/22 0336 06/18/22 0134  WBC 9.4 11.9* 13.2* 12.5*    Liver Function Tests: Recent Labs  Lab 06/14/22 1633 06/16/22 0403 06/17/22 0336 06/18/22 0134  AST 39 $Remo'25 23 29  29  'HDVkS$ ALT $Rem'30 24 20 26  25  'lmfP$ ALKPHOS 86 74 73 77  75  BILITOT 1.6* 0.7 0.5 0.7  0.7  PROT 8.0 6.9 7.0 7.2  7.1  ALBUMIN 3.9 3.6 3.6 3.7  3.6   No results for input(s): "LIPASE", "AMYLASE" in the last 168 hours. No results for input(s): "AMMONIA" in the last 168 hours.  ABG No results found for: "PHART", "PCO2ART", "PO2ART", "HCO3", "TCO2", "ACIDBASEDEF", "O2SAT"   Coagulation Profile: No results for input(s): "INR", "PROTIME" in the last 168 hours.  Cardiac Enzymes: No results for input(s): "CKTOTAL", "CKMB", "CKMBINDEX", "TROPONINI" in the last 168 hours.  HbA1C: Hgb A1c MFr Bld  Date/Time Value Ref Range Status  05/09/2010 08:54 PM 5.5 <5.7 % Final    Comment:    See lab report for associated comment(s)  12/11/2009 08:24 PM 6.1 4.6 - 6.1 % Final    Comment:    See lab report for associated comment(s)    CBG: Recent Labs  Lab 06/17/22 1629  GLUCAP 138*    Review of Systems:   Please see the history of present illness. All other systems reviewed and are negative    Past Medical History:  She,  has a past medical history of Cancer ([place]),  Essential hypertension (02/16/2017), Hyperlipemia, Hyperlipidemia (02/16/2017), Hypertension, Hypothyroidism (02/16/2017), and Morbid obesity ([place]) (02/16/2017).   Surgical History:   Past Surgical History:  Procedure Laterality Date   IR IMAGING GUIDED PORT INSERTION  06/17/2022   IR THORACENTESIS ASP PLEURAL SPACE W/IMG GUIDE  06/17/2022   THYROIDECTOMY       Social History:   reports that she has quit smoking. She has never used smokeless tobacco. She reports that she does not drink alcohol and does not use drugs.   Family History:  Her family history includes Diabetes in her father and paternal grandfather; Heart attack in her maternal grandmother and paternal grandfather; Hypertension in her father, mother, and sister; Kidney disease in her mother; Lung cancer in her father; Stroke in her father, paternal grandfather, and paternal grandmother.   Allergies Allergies  Allergen Reactions   Penicillins Other (See Comments)    Severe Yeast infection... No deathly reactions   Codeine Nausea Only   Morphine Other (See Comments)    headache   Tramadol     headache     Home  Medications  Prior to Admission medications   Medication Sig Start Date End Date Taking? Authorizing Provider  cholecalciferol (VITAMIN D3) 25 MCG (1000 UNIT) tablet Take 1,000 Units by mouth daily.   Yes [provider]  Coenzyme Q10 (COQ10) 100 MG CAPS Take 1 capsule by mouth daily. 04/16/19  Yes [provider]  fentaNYL (DURAGESIC) 12 MCG/HR Place 1 patch onto the skin every 3 (three) days. 06/12/22  Yes Volanda Napoleon, MD  fluconazole (DIFLUCAN) 100 MG tablet Take 1 tablet (100 mg total) by mouth daily. 06/07/22  Yes Ennever, Rudell Cobb, MD  HYDROmorphone (DILAUDID) 2 MG tablet Take 1 tablet (2 mg total) by mouth every 6 (six) hours as needed for severe pain. 06/07/22  Yes Volanda Napoleon, MD  levothyroxine (SYNTHROID) 88 MCG tablet 1 TABLET EVERY MORNING ON AN EMPTY STOMACH ORALLY ONCE A DAY 90 DAYS    Yes [provider]  lisinopril-hydrochlorothiazide (ZESTORETIC) 10-12.5 MG tablet Take 1 tablet by mouth daily. 04/25/22  Yes [provider]  rosuvastatin (CRESTOR) 10 MG tablet TAKE 1 TABLET BY MOUTH EVERY DAY FOR 90 DAYS   Yes [provider]  dexamethasone (DECADRON) 4 MG tablet Take 2 tablets (8 mg total) by mouth 2 (two) times daily. Please make sure you take this with food. Patient not taking: Reported on 06/14/2022 06/07/22   Volanda Napoleon, MD  gabapentin (NEURONTIN) 300 MG capsule Take 1 capsule (300 mg total) by mouth 4 (four) times daily. Patient not taking: Reported on 06/14/2022 06/05/22   Volanda Napoleon, MD  prochlorperazine (COMPAZINE) 10 MG tablet Take 1 tablet (10 mg total) by mouth every 6 (six) hours as needed for nausea or vomiting. 06/14/22   Tyler Pita, MD    Sherrilyn Rist, MD [place] [facility] Pager: See Shea Evans

## 2022-06-19 ENCOUNTER — Inpatient Hospital Stay (HOSPITAL_COMMUNITY): Payer: BC Managed Care – PPO

## 2022-06-19 ENCOUNTER — Other Ambulatory Visit: Payer: Self-pay

## 2022-06-19 ENCOUNTER — Encounter: Payer: Self-pay | Admitting: *Deleted

## 2022-06-19 ENCOUNTER — Ambulatory Visit
Admission: RE | Admit: 2022-06-19 | Discharge: 2022-06-19 | Disposition: A | Discharge status: Home / Self Care | Payer: BC Managed Care – PPO | Source: Ambulatory Visit | Attending: Radiation Oncology | Admitting: Radiation Oncology

## 2022-06-19 DIAGNOSIS — N179 Acute kidney failure, unspecified: Secondary | ICD-10-CM | POA: Diagnosis not present

## 2022-06-19 DIAGNOSIS — I10 Essential (primary) hypertension: Secondary | ICD-10-CM | POA: Diagnosis not present

## 2022-06-19 DIAGNOSIS — R52 Pain, unspecified: Secondary | ICD-10-CM | POA: Diagnosis not present

## 2022-06-19 LAB — CBC WITH DIFFERENTIAL/PLATELET
Abs Immature Granulocytes: 0.1 10*3/uL — ABNORMAL HIGH (ref 0.00–0.07)
Basophils Absolute: 0 10*3/uL (ref 0.0–0.1)
Basophils Relative: 0 %
Eosinophils Absolute: 0 10*3/uL (ref 0.0–0.5)
Eosinophils Relative: 0 %
HCT: 28.9 % — ABNORMAL LOW (ref 36.0–46.0)
Hemoglobin: 9.5 g/dL — ABNORMAL LOW (ref 12.0–15.0)
Immature Granulocytes: 1 %
Lymphocytes Relative: 5 %
Lymphs Abs: 0.4 10*3/uL — ABNORMAL LOW (ref 0.7–4.0)
MCH: 27.5 pg (ref 26.0–34.0)
MCHC: 32.9 g/dL (ref 30.0–36.0)
MCV: 83.8 fL (ref 80.0–100.0)
Monocytes Absolute: 1.8 10*3/uL — ABNORMAL HIGH (ref 0.1–1.0)
Monocytes Relative: 22 %
Neutro Abs: 5.9 10*3/uL (ref 1.7–7.7)
Neutrophils Relative %: 72 %
Platelets: 148 10*3/uL — ABNORMAL LOW (ref 150–400)
RBC: 3.45 MIL/uL — ABNORMAL LOW (ref 3.87–5.11)
RDW: 17.6 % — ABNORMAL HIGH (ref 11.5–15.5)
WBC: 8.3 10*3/uL (ref 4.0–10.5)
nRBC: 0 % (ref 0.0–0.2)

## 2022-06-19 LAB — RAD ONC ARIA SESSION SUMMARY
Course Elapsed Days: 6
Plan Fractions Treated to Date: 5
Plan Prescribed Dose Per Fraction: 2 Gy
Plan Total Fractions Prescribed: 10
Plan Total Prescribed Dose: 20 Gy
Reference Point Dosage Given to Date: 10 Gy
Reference Point Session Dosage Given: 2 Gy
Session Number: 5

## 2022-06-19 LAB — COMPREHENSIVE METABOLIC PANEL
ALT: 26 U/L (ref 0–44)
AST: 29 U/L (ref 15–41)
Albumin: 3 g/dL — ABNORMAL LOW (ref 3.5–5.0)
Alkaline Phosphatase: 62 U/L (ref 38–126)
Anion gap: 7 (ref 5–15)
BUN: 39 mg/dL — ABNORMAL HIGH (ref 8–23)
CO2: 22 mmol/L (ref 22–32)
Calcium: 8.2 mg/dL — ABNORMAL LOW (ref 8.9–10.3)
Chloride: 114 mmol/L — ABNORMAL HIGH (ref 98–111)
Creatinine, Ser: 2.13 mg/dL — ABNORMAL HIGH (ref 0.44–1.00)
GFR, Estimated: 26 mL/min — ABNORMAL LOW (ref >60)
Glucose, Bld: 104 mg/dL — ABNORMAL HIGH (ref 70–99)
Potassium: 3.4 mmol/L — ABNORMAL LOW (ref 3.5–5.1)
Sodium: 143 mmol/L (ref 135–145)
Total Bilirubin: 0.4 mg/dL (ref 0.3–1.2)
Total Protein: 6 g/dL — ABNORMAL LOW (ref 6.5–8.1)

## 2022-06-19 LAB — ERYTHROPOIETIN: Erythropoietin: 12.9 m[IU]/mL (ref 2.6–18.5)

## 2022-06-19 LAB — CYTOLOGY - NON PAP

## 2022-06-19 MED ORDER — POTASSIUM CHLORIDE CRYS ER 20 MEQ PO TBCR
20.0000 meq | EXTENDED_RELEASE_TABLET | Freq: Once | ORAL | Status: DC
  Filled 2022-06-19: qty 1

## 2022-06-19 MED ORDER — AZITHROMYCIN 250 MG PO TABS
500.0000 mg | ORAL_TABLET | Freq: Once | ORAL | Status: AC
  Administered 2022-06-19: 500 mg via ORAL
  Filled 2022-06-19: qty 2

## 2022-06-19 MED ORDER — CEFDINIR 300 MG PO CAPS
300.0000 mg | ORAL_CAPSULE | Freq: Two times a day (BID) | ORAL | Status: DC
  Administered 2022-06-19 (×2): 300 mg via ORAL
  Filled 2022-06-19 (×2): qty 1

## 2022-06-19 NOTE — Progress Notes (Signed)
NAME:  Michelle [name], MRN:  [id number], DOB:  23-Feb-1961, LOS: 5 ADMISSION DATE:  06/14/2022, CONSULTATION DATE: 06/18/2022 REFERRING MD: Dr.Regalado, CHIEF COMPLAINT: Pleural effusion  History of Present Illness:  Michelle [name] is a 61 year old female with a past medical history significant for bony metastasis likely secondary to multiple myeloma hypertension, hyperlipidemia, and hypothyroidism who presented to the emergency department 8/11 for intractable back pain secondary to bony metastasis.  Note patient underwent MRI 8/3 which revealed worsening widespread tumor infiltration new extraosseous tumors ventral epidural space.  First session of palliative radiation was 8/10   On ED arrival patient was seen hemodynamically stable with tachycardia.  Pertinent lab work included AKI with a creatinine up to 2,  hypercalcemia, and elevated total bilirubin.  8/12 patient had chest x-ray performed which revealed partially loculated moderate right pleural effusion.  Interventional radiology was consulted 8/14 and patient underwent diagnostic and therapeutic right thoracentesis with 1.2 L of fluid removed.  8/15 PCCM consulted for further management of pleural effusion.    Pertinent  Medical History  Bony metastasis likely secondary to multiple myeloma hypertension, hyperlipidemia, and hypothyroidism   Significant Hospital Events: Including procedures, antibiotic start and stop dates in addition to other pertinent events   8/11 presented for intractable pain in the setting of bony metastasis  8/12 patient had chest x-ray performed which revealed partially loculated moderate right pleural effusion.   8/14 Interventional radiology was consulted  and patient underwent diagnostic and therapeutic right thoracentesis with 1.2 L of fluid removed.   8/15 PCCM consulted for further management of pleural effusion.  Interim History / Subjective:  Elderly lady, does not appear to be in distress, on room air Denies  any significant complaints Pain is fairly well controlled  Objective   Blood pressure 139/74, pulse (!) 109, temperature 97.7 F (36.5 C), temperature source Oral, resp. rate 18, height 5' 4.25" (1.632 m), weight 89.5 kg, SpO2 98 %.        Intake/Output Summary (Last 24 hours) at 06/19/2022 0836 Last data filed at 06/19/2022 0810 Gross per 24 hour  Intake 2511.3 ml  Output 1300 ml  Net 1211.3 ml   Filed Weights   06/14/22 1534  Weight: 89.5 kg    Examination: General: Chronically ill-appearing, does not appear in acute distress HENT: Moist oral mucosa Lungs: Decreased air entry bilaterally Cardiovascular: S1-S2 appreciated Abdomen: Soft, bowel sounds appreciated Extremities: No clubbing, no edema Neuro: Alert and oriented x3, moving all extremities GU: [place] [facility] Problem list     Assessment & Plan:  Loculated pleural effusion s/p thoracentesis -Postthoracentesis for 1.2 L of serosanguineous fluid -Fluid is exudative -Cytology pending -Significant improvement in chest x-ray findings  On empiric antibiotics -Improvement in leukocytosis, from 13.2-12.5-8.3 -Leukocytosis may be related to Decadron -On azithromycin and ceftriaxone -Azithromycin may be discontinued after 2 doses today -Ceftriaxone may be switched to Omnicef, 300 p.o. twice daily for 5 days  Mobilize as tolerated  Will follow-up on cytology  AKI  Discharge planning from a pulmonary perspective  Multiple myeloma -Following up with oncology  Obtain CT chest -Extensive destruction of eighth rib Labs   CBC: Recent Labs  Lab 06/14/22 1633 06/16/22 0403 06/17/22 0336 06/18/22 0134 06/19/22 0330  WBC 9.4 11.9* 13.2* 12.5* 8.3  NEUTROABS 4.8 8.5* 9.7* 8.8* 5.9  HGB 9.0* 8.1* 7.9* 10.8* 9.5*  HCT 27.7* 24.7* 24.3* 32.4* 28.9*  MCV 82.2 82.3 82.4 82.7 83.8  PLT 167 164 174 176 148*  Basic Metabolic Panel: Recent Labs  Lab 06/15/22 0342 06/16/22 0403 06/17/22 0336  06/18/22 0134 06/19/22 0330  NA 137 138 138 142 143  K 3.9 3.7 3.8 4.0 3.4*  CL 101 106 106 111 114*  CO2 _0 GLUCOSE 97 130* 132* 122* 104*  BUN 31* 43* 49* 49* 39*  CREATININE 2.07* 2.05* 2.10* 2.47* 2.13*  CALCIUM 12.2* 10.6* 9.4 9.5 8.2*   GFR: Estimated Creatinine Clearance: 29.4 mL/min (A) (by C-G formula based on SCr of 2.13 mg/dL (H)). Recent Labs  Lab 06/16/22 0403 06/17/22 0336 06/18/22 0134 06/19/22 0330  WBC 11.9* 13.2* 12.5* 8.3    Liver Function Tests: Recent Labs  Lab 06/14/22 1633 06/16/22 0403 06/17/22 0336 06/18/22 0134 06/19/22 0330  AST 39 _1 ALT _2 ALKPHOS 86 74 73 77  75 62  BILITOT 1.6* 0.7 0.5 0.7  0.7 0.4  PROT 8.0 6.9 7.0 7.2  7.1 6.0*  ALBUMIN 3.9 3.6 3.6 3.7  3.6 3.0*   No results for input(s): "LIPASE", "AMYLASE" in the last 168 hours. No results for input(s): "AMMONIA" in the last 168 hours.  ABG No results found for: "PHART", "PCO2ART", "PO2ART", "HCO3", "TCO2", "ACIDBASEDEF", "O2SAT"   Coagulation Profile: No results for input(s): "INR", "PROTIME" in the last 168 hours.  Cardiac Enzymes: No results for input(s): "CKTOTAL", "CKMB", "CKMBINDEX", "TROPONINI" in the last 168 hours.  HbA1C: Hgb A1c MFr Bld  Date/Time Value Ref Range Status  05/09/2010 08:54 PM 5.5 <5.7 % Final    Comment:    See lab report for associated comment(s)  12/11/2009 08:24 PM 6.1 4.6 - 6.1 % Final    Comment:    See lab report for associated comment(s)    CBG: Recent Labs  Lab 06/17/22 1629  GLUCAP 138*    Review of Systems:   Pain is better  Past Medical History:  She,  has a past medical history of Cancer ([place]), Essential hypertension (02/16/2017), Hyperlipemia, Hyperlipidemia (02/16/2017), Hypertension, Hypothyroidism (02/16/2017), and Morbid obesity ([place]) (02/16/2017).   Surgical History:   Past Surgical History:  Procedure Laterality Date   IR IMAGING GUIDED PORT INSERTION  06/17/2022    IR THORACENTESIS ASP PLEURAL SPACE W/IMG GUIDE  06/17/2022   THYROIDECTOMY       Social History:   reports that she has quit smoking. She has never used smokeless tobacco. She reports that she does not drink alcohol and does not use drugs.   Family History:  Her family history includes Diabetes in her father and paternal grandfather; Heart attack in her maternal grandmother and paternal grandfather; Hypertension in her father, mother, and sister; Kidney disease in her mother; Lung cancer in her father; Stroke in her father, paternal grandfather, and paternal grandmother.   Allergies Allergies  Allergen Reactions   Penicillins Other (See Comments)    Severe Yeast infection... No deathly reactions   Codeine Nausea Only   Morphine Other (See Comments)    headache   Tramadol     headache     Home Medications  Prior to Admission medications   Medication Sig Start Date End Date Taking? Authorizing Provider  cholecalciferol (VITAMIN D3) 25 MCG (1000 UNIT) tablet Take 1,000 Units by mouth daily.   Yes [provider]  Coenzyme Q10 (COQ10) 100 MG CAPS Take 1 capsule by mouth daily. 04/16/19  Yes [provider]  fentaNYL (DURAGESIC) 12 MCG/HR Place 1 patch onto  the skin every 3 (three) days. 06/12/22  Yes Volanda Napoleon, MD  fluconazole (DIFLUCAN) 100 MG tablet Take 1 tablet (100 mg total) by mouth daily. 06/07/22  Yes Ennever, Rudell Cobb, MD  HYDROmorphone (DILAUDID) 2 MG tablet Take 1 tablet (2 mg total) by mouth every 6 (six) hours as needed for severe pain. 06/07/22  Yes Volanda Napoleon, MD  levothyroxine (SYNTHROID) 88 MCG tablet 1 TABLET EVERY MORNING ON AN EMPTY STOMACH ORALLY ONCE A DAY 90 DAYS   Yes [provider]  lisinopril-hydrochlorothiazide (ZESTORETIC) 10-12.5 MG tablet Take 1 tablet by mouth daily. 04/25/22  Yes [provider]  rosuvastatin (CRESTOR) 10 MG tablet TAKE 1 TABLET BY MOUTH EVERY DAY FOR 90 DAYS   Yes [provider]   dexamethasone (DECADRON) 4 MG tablet Take 2 tablets (8 mg total) by mouth 2 (two) times daily. Please make sure you take this with food. Patient not taking: Reported on 06/14/2022 06/07/22   Volanda Napoleon, MD  gabapentin (NEURONTIN) 300 MG capsule Take 1 capsule (300 mg total) by mouth 4 (four) times daily. Patient not taking: Reported on 06/14/2022 06/05/22   Volanda Napoleon, MD  prochlorperazine (COMPAZINE) 10 MG tablet Take 1 tablet (10 mg total) by mouth every 6 (six) hours as needed for nausea or vomiting. 06/14/22   Tyler Pita, MD    Sherrilyn Rist, MD [place] [facility] Pager: See Shea Evans

## 2022-06-19 NOTE — Plan of Care (Signed)
  Problem: Health Behavior/Discharge Planning: Goal: Ability to manage health-related needs will improve Outcome: Progressing   Problem: Clinical Measurements: Goal: Ability to maintain clinical measurements within normal limits will improve Outcome: Progressing Goal: Will remain free from infection Outcome: Progressing Goal: Diagnostic test results will improve Outcome: Progressing Goal: Respiratory complications will improve Outcome: Progressing Goal: Cardiovascular complication will be avoided Outcome: Progressing   Problem: Activity: Goal: Risk for activity intolerance will decrease Outcome: Progressing   Problem: Nutrition: Goal: Adequate nutrition will be maintained Outcome: Progressing   Problem: Coping: Goal: Level of anxiety will decrease Outcome: Progressing   Problem: Elimination: Goal: Will not experience complications related to bowel motility Outcome: Progressing Goal: Will not experience complications related to urinary retention Outcome: Progressing   Problem: Pain Managment: Goal: General experience of comfort will improve Outcome: Progressing   Problem: Safety: Goal: Ability to remain free from injury will improve Outcome: Progressing   Problem: Skin Integrity: Goal: Risk for impaired skin integrity will decrease Outcome: Progressing   Problem: Education: Goal: Knowledge of General Education information will improve Description: Including pain rating scale, medication(s)/side effects and non-pharmacologic comfort measures Outcome: Completed/Met

## 2022-06-19 NOTE — Progress Notes (Signed)
Patient continues to be hospitalized. She had a thoracentesis on 06/17/22. Pathology posted today is consistent with multiple myeloma. Results reviewed with Dr Marin Olp. No additional path needs at this time.   Will continue to follow for post discharge needs and office follow up.  Oncology Nurse Navigator Documentation     06/19/2022   11:00 AM  Oncology Nurse Navigator Flowsheets  Navigator Follow Up Date: 06/25/2022  Navigator Follow Up Reason: Appointment Review  Navigator Location [facility]  Navigator Encounter Type Pathology Review;Appt/Treatment Plan Review  Patient Visit Type MedOnc  Treatment Phase Active Tx  Barriers/Navigation Needs Coordination of Care;Education  Interventions Coordination of Care  Acuity Level 2-Minimal Needs (1-2 Barriers Identified)  Coordination of Care Pathology  Support Groups/Services Friends and Family  Time Spent with Patient 15

## 2022-06-19 NOTE — Evaluation (Signed)
Physical Therapy Evaluation Patient Details Name: Michelle [name] MRN: [id number] DOB: Dec 06, 1960 Today's Date: 06/19/2022  History of Present Illness  Michelle [name] is a 61 y.o. female w/ho presents with worsening intractable back pain. Of note, pt had thoracentesis for R pleural effusion 8/14.  PMH: bony metastasis likely secondary to likely multiple myeloma with pathology pending, HTN, hypothyroidism, HLD  Clinical Impression  Pt admitted with above diagnosis. Pt from home with daughters who assist her with in home ambulation using SPC and ADLs/IADLs as needed. Pt currently needing min A-min guard with mobility, slow to mobilize, verbalizes fear of falling with movement and wanting to move slowly. Cues for hand placement with transfers and RW positioning with ambulation. Pt with slow steady gait, no overt LOB, limited by pain. Pt has SPC and RW at home, agreeable to HHPT recommendation and reports daughters will be present to assist as needed. Pt currently with functional limitations due to the deficits listed below (see PT Problem List). Pt will benefit from skilled PT to increase their independence and safety with mobility to allow discharge to the venue listed below.          Recommendations for follow up therapy are one component of a multi-disciplinary discharge planning process, led by the attending physician.  Recommendations may be updated based on patient status, additional functional criteria and insurance authorization.  Follow Up Recommendations Home health PT      Assistance Recommended at Discharge Intermittent Supervision/Assistance  Patient can return home with the following  A little help with walking and/or transfers;A little help with bathing/dressing/bathroom;Assistance with cooking/housework;Assist for transportation    Equipment Recommendations None recommended by PT  Recommendations for Other Services       Functional Status Assessment Patient has had a recent decline  in their functional status and demonstrates the ability to make significant improvements in function in a reasonable and predictable amount of time.     Precautions / Restrictions Precautions Precautions: Fall Restrictions Weight Bearing Restrictions: No      Mobility  Bed Mobility Overal bed mobility: Needs Assistance Bed Mobility: Supine to Sit  Supine to sit: Min guard General bed mobility comments: increased time, use of bed rail to upright trunk and scoot out to EOB    Transfers Overall transfer level: Needs assistance Equipment used: Rolling walker (2 wheels) Transfers: Sit to/from Stand Sit to Stand: Min assist, Min guard  General transfer comment: steadying assist, slow movement, pt verbalizes fear of falling, hip extension last, heavy use of hands    Ambulation/Gait Ambulation/Gait assistance: Min guard Gait Distance (Feet): 40 Feet Assistive device: Rolling walker (2 wheels) Gait Pattern/deviations: Step-to pattern, Decreased stride length Gait velocity: decreased  General Gait Details: very slow, step to pattern, strong grip on RW, steady without LOB, increased time with turns, limited by pain, decreased bil knee ROM throughout swing  Stairs            Wheelchair Mobility    Modified Rankin (Stroke Patients Only)       Balance Overall balance assessment: Needs assistance, History of Falls Sitting-balance support: Feet supported, Bilateral upper extremity supported Sitting balance-Leahy Scale: Fair  Standing balance support: During functional activity, Bilateral upper extremity supported, Reliant on assistive device for balance Standing balance-Leahy Scale: Poor        Pertinent Vitals/Pain Pain Assessment Pain Assessment: Faces Faces Pain Scale: Hurts little more Pain Location: generalized with mobility Pain Descriptors / Indicators: Aching, Sore Pain Intervention(s): Limited activity within patient's tolerance,  Monitored during session,  Repositioned    Home Living Family/patient expects to be discharged to:: Private residence Living Arrangements: Children (2 daughters) Available Help at Discharge: Family Type of Home: Apartment Home Access: Level entry  Home Layout: One level [place]: Conservation officer, nature (2 wheels);Cane - single point      Prior Function Prior Level of Function : Needs assist (daughters have been assisting pt since ~june 2023)  Mobility Comments: pt reports home ambulator with [facility] and daughters assisting as needed, stays in bed while daughter is at work ADLs Comments: daughters assisting with self care tasks, daughters complete household chores     Hand Dominance   Dominant Hand: Right    Extremity/Trunk Assessment   Upper Extremity Assessment Upper Extremity Assessment: Overall WFL for tasks assessed    Lower Extremity Assessment Lower Extremity Assessment: Generalized weakness (AROM WNL, strength grossly 3+/5)    Cervical / Trunk Assessment Cervical / Trunk Assessment: Normal  Communication   Communication: No difficulties  Cognition Arousal/Alertness: Awake/alert Behavior During Therapy: WFL for tasks assessed/performed Overall Cognitive Status: Within Functional Limits for tasks assessed           General Comments      Exercises     Assessment/Plan    PT Assessment Patient needs continued PT services  PT Problem List Decreased strength;Decreased range of motion;Decreased activity tolerance;Decreased balance;Decreased mobility;Decreased knowledge of use of DME;Pain;Cardiopulmonary status limiting activity       PT Treatment Interventions DME instruction;Gait training;Functional mobility training;Therapeutic activities;Therapeutic exercise;Balance training;Patient/family education    PT Goals (Current goals can be found in the Care Plan section)  Acute Rehab PT Goals Patient Stated Goal: home with daughters and HHPT PT Goal Formulation: With patient/family Time For  Goal Achievement: 07/03/22 Potential to Achieve Goals: Good    Frequency Min 3X/week     Co-evaluation               AM-PAC PT "6 Clicks" Mobility  Outcome Measure Help needed turning from your back to your side while in a flat bed without using bedrails?: A Little Help needed moving from lying on your back to sitting on the side of a flat bed without using bedrails?: A Little Help needed moving to and from a bed to a chair (including a wheelchair)?: A Little Help needed standing up from a chair using your arms (e.g., wheelchair or bedside chair)?: A Little Help needed to walk in hospital room?: A Little Help needed climbing 3-5 steps with a railing? : A Lot 6 Click Score: 17    End of Session Equipment Utilized During Treatment: Gait belt Activity Tolerance: Patient tolerated treatment well Patient left: in chair;with call bell/phone within reach;with family/visitor present Nurse Communication: Mobility status PT Visit Diagnosis: Other abnormalities of gait and mobility (R26.89);Muscle weakness (generalized) (M62.81);Difficulty in walking, not elsewhere classified (R26.2)    Time: 5449-2010 PT Time Calculation (min) (ACUTE ONLY): 25 min   Charges:   PT Evaluation $PT Eval Low Complexity: 1 Low PT Treatments $Therapeutic Activity: 8-22 mins        Tori Coraima Tibbs PT, DPT 06/19/22, 1:24 PM

## 2022-06-19 NOTE — Progress Notes (Signed)
So far, everything seems to be going pretty well.  I think she really needs to have some physical therapy to see how much she can really do is similar to pain she really has.  I do think she really is getting out of bed all that much.  I am still awaiting the results back from her fluid cytology.  I think this will be incredibly interesting.  Again, I have no clue why the 24-hour urine was not done with respect to the light chains.  We really need to have this.  Her labs show BUN 39 creatinine 2.13.  Calcium is 8.2.  Her potassium is 3.4.  The hemoglobin is 9.5.  This is dropping slowly.  Her white cell count is 8.3.  Platelet count 148,000.  She is not eating much.  She is going to the bathroom.  She is having some bowel movements.  Hopefully the appetite will come around.  She is still doing radiation therapy.  This is for the lower back.  I still am not sure that she is only dealing with myeloma.  Again is very unusual for myeloma to cause a pleural effusion.  We will have to see what the cytology shows.  Her vital signs are temperature 97.6.  Pulse 95.  Blood pressure 122/77.  Her lungs are clear bilaterally.  Oral exam does not show any thrush.  Cardiac exam regular rate and rhythm.  There are no murmurs.  Abdomen soft.  Bowel sounds are active.  There is no guarding or rebound tenderness.  Extremity shows no clubbing, cyanosis or edema.  Neurological exam is nonfocal.  Again if we are dealing with at least myeloma.  There may be another malignancy depending on what we find with the pleural effusion.  I do think that we have to repeat a 24-hour urine on her.  Again I have no clue as to why the initial 24-hour urine was not done properly.  We really need physical therapy to get her to ambulate and see how she does with pain while ambulating.  We will have to watch the anemia.  I do appreciate the wonderful care that she is getting from all the staff on [place], MD  Vonna Kotyk  1:9

## 2022-06-19 NOTE — Plan of Care (Signed)

## 2022-06-19 NOTE — Progress Notes (Signed)
[facility]  PROGRESS NOTE  LACRESHIA BONDARENKO RFF:[id number] DOB: 1961/03/16 DOA: 06/14/2022 PCP: Leeroy Cha, MD   Brief HPI:   61 year old with past medical history significant for bony metastasis likely secondary to multiple myeloma, pathology pending, hypertension, hypothyroidism, hyperlipidemia who presented with worsening intractable back pain.  She has had worsening back pain due to her metastatic disease.  MRI 8/3 showed worsening widespread tumor infiltrate with new extraosseous tumor in the ventral epidural space. Patient started on radiation therapy on 8/10 Also found to have moderate right pleural effusion which appeared to be loculated She underwent Port-A-Cath placement and thoracentesis on 06/17/2022   Subjective   Patient seen and examined, denies any pain.   Assessment/Plan:     Intractable pain -Widespread tumor infiltration with new extraosseous tumor in the ventral epidural space as per MRI from 8/3 -Pathology from bone marrow biopsy showed hypercellular bone marrow with plasma cell neoplasm -Free kappa light chain 1.153, M spike 1.8 -Dr. Marin Olp following -Started radiation treatment on 8/10 under care of Dr. Tammi Klippel -Continue Decadron 20 mg IV daily -Fentanyl patch increased from 12.5 mcg to 25 mcg to 72 hours -Continue oral hydromorphone as needed every 6 hours -Underwent Port-A-Cath placement on 06/17/2022  Moderate right pleural effusion -Underwent thoracentesis on 06/17/2022 yielding 1.2 L of exudative fluid - Started  IV Ceftriaxone and Zithromax, Protein ration 0.6, LDH ratio 1.6, WBC 16,000 - Follow pleural fluid culture: no growth , Cytology is consistent with plasma cell dyscrasia, multiple myeloma Repeated chest x ray 8/15: Near complete resolution of right effusion, no pneumothorax. -Pulmonology recommends to discontinue azithromycin today -And switch IV ceftriaxone to Omnicef 10 mg p.o. twice daily for 5 days  Sinus  tachycardia -Unclear etiology, VQ scan was negative for PE -Started on low-dose metoprolol 25 mg p.o. twice daily  Acute kidney injury in setting of multiple myeloma -Patient presented with creatinine of 2.0, baseline creatinine 1.0 -In setting of multiple myeloma -Creatinine has improved to 2.13 -Continue IV normal saline at 50 mL/h  Hypokalemia -Replace potassium and follow BMP in am  Hyperlipidemia -Crestor 10 mg daily  Hypothyroidism -Continue Synthroid  Hypercalcemia -Presented with calcium at 13.8. -Secondary to multiple myeloma  -Received IV Zometa, calcitonin - Ca down to 8.2  Hypertension -Continue to hold lisinopril HCTZ due to acute kidney injury and hypercalcemia -Continue metoprolol 25 mg p.o. twice daily as above  Anemia of chronic disease -Anemia secondary to multiple myeloma -Patient received 2 units PRBC on 06/17/2022  Medications     cefdinir  300 mg Oral Q12H   Chlorhexidine Gluconate Cloth  6 each Topical Daily   cholecalciferol  1,000 Units Oral Daily   dronabinol  5 mg Oral BID AC   enoxaparin (LOVENOX) injection  30 mg Subcutaneous Q24H   famciclovir  500 mg Oral Daily   fentaNYL  1 patch Transdermal Q72H   fluconazole  100 mg Oral Daily   HYDROmorphone  2 mg Oral Q6H   lactulose  20 g Oral TID   levothyroxine  88 mcg Oral Q0600   metoprolol tartrate  25 mg Oral BID   multivitamin with minerals  1 tablet Oral Daily   pantoprazole  40 mg Oral BID   potassium chloride  20 mEq Oral Once   protein supplement  1 Scoop Oral TID WC   rosuvastatin  10 mg Oral Daily     Data Reviewed:   CBG:  Recent Labs  Lab 06/17/22 1629  GLUCAP 138*  SpO2: 100 % O2 Flow Rate (L/min): 2 L/min    Vitals:   06/18/22 2125 06/18/22 2237 06/19/22 0652 06/19/22 1213  BP: (!) 138/92 122/77 139/74 (!) 146/81  Pulse: (!) 110 95 (!) 109 (!) 110  Resp: _0 Temp: 97.6 F (36.4 C) 97.6 F (36.4 C) 97.7 F (36.5 C) 97.7 F (36.5 C)  TempSrc:  Oral Oral Oral Oral  SpO2: 100% 96% 98% 100%  Weight:      Height:          Data Reviewed:  Basic Metabolic Panel: Recent Labs  Lab 06/15/22 0342 06/16/22 0403 06/17/22 0336 06/18/22 0134 06/19/22 0330  NA 137 138 138 142 143  K 3.9 3.7 3.8 4.0 3.4*  CL 101 106 106 111 114*  CO2 _1 GLUCOSE 97 130* 132* 122* 104*  BUN 31* 43* 49* 49* 39*  CREATININE 2.07* 2.05* 2.10* 2.47* 2.13*  CALCIUM 12.2* 10.6* 9.4 9.5 8.2*    CBC: Recent Labs  Lab 06/14/22 1633 06/16/22 0403 06/17/22 0336 06/18/22 0134 06/19/22 0330  WBC 9.4 11.9* 13.2* 12.5* 8.3  NEUTROABS 4.8 8.5* 9.7* 8.8* 5.9  HGB 9.0* 8.1* 7.9* 10.8* 9.5*  HCT 27.7* 24.7* 24.3* 32.4* 28.9*  MCV 82.2 82.3 82.4 82.7 83.8  PLT 167 164 174 176 148*    LFT Recent Labs  Lab 06/14/22 1633 06/16/22 0403 06/17/22 0336 06/18/22 0134 06/19/22 0330  AST 39 _2 ALT _3 ALKPHOS 86 74 73 77  75 62  BILITOT 1.6* 0.7 0.5 0.7  0.7 0.4  PROT 8.0 6.9 7.0 7.2  7.1 6.0*  ALBUMIN 3.9 3.6 3.6 3.7  3.6 3.0*     Antibiotics: Anti-infectives (From admission, onward)    Start     Dose/Rate Route Frequency Ordered Stop   06/19/22 1000  azithromycin (ZITHROMAX) tablet 500 mg        500 mg Oral  Once 06/19/22 0843 06/19/22 1058   06/19/22 1000  cefdinir (OMNICEF) capsule 300 mg        300 mg Oral Every 12 hours 06/19/22 0843 06/24/22 0959   06/17/22 2000  azithromycin (ZITHROMAX) 500 mg in sodium chloride 0.9 % 250 mL IVPB  Status:  Discontinued        500 mg 250 mL/hr over 60 Minutes Intravenous Every 24 hours 06/17/22 1741 06/19/22 0843   06/17/22 1830  cefTRIAXone (ROCEPHIN) 2 g in sodium chloride 0.9 % 100 mL IVPB  Status:  Discontinued        2 g 200 mL/hr over 30 Minutes Intravenous Every 24 hours 06/17/22 1740 06/19/22 0843   06/15/22 1000  fluconazole (DIFLUCAN) tablet 100 mg        100 mg Oral Daily 06/14/22 2315     06/15/22 1000  famciclovir (FAMVIR) tablet 500 mg         500 mg Oral Daily 06/15/22 0703          DVT prophylaxis: Lovenox  Code Status: Full code  Family Communication: Discussed with daughter at bedside   CONSULTS oncology   Objective    Physical Examination:  General-appears in no acute distress Heart-S1-S2, regular, no murmur auscultated Lungs-clear to auscultation bilaterally, no wheezing or crackles auscultated Abdomen-soft, nontender, no organomegaly Extremities-no edema in the lower extremities Neuro-alert, oriented x3, no focal deficit noted   Status is: Inpatient:  Oswald Hillock   [facility] If 7PM-7AM, please contact night-coverage at [email or web address], Office  [phone]   06/19/2022, 4:37 PM  LOS: 5 days

## 2022-06-20 ENCOUNTER — Ambulatory Visit
Admission: RE | Admit: 2022-06-20 | Discharge: 2022-06-20 | Disposition: A | Discharge status: Home / Self Care | Payer: BC Managed Care – PPO | Source: Ambulatory Visit | Attending: Radiation Oncology | Admitting: Radiation Oncology

## 2022-06-20 ENCOUNTER — Encounter (HOSPITAL_COMMUNITY): Payer: Self-pay | Admitting: Family Medicine

## 2022-06-20 ENCOUNTER — Other Ambulatory Visit: Payer: Self-pay

## 2022-06-20 DIAGNOSIS — I10 Essential (primary) hypertension: Secondary | ICD-10-CM | POA: Diagnosis not present

## 2022-06-20 DIAGNOSIS — E039 Hypothyroidism, unspecified: Secondary | ICD-10-CM | POA: Diagnosis not present

## 2022-06-20 DIAGNOSIS — C9000 Multiple myeloma not having achieved remission: Secondary | ICD-10-CM

## 2022-06-20 DIAGNOSIS — N179 Acute kidney failure, unspecified: Secondary | ICD-10-CM | POA: Diagnosis not present

## 2022-06-20 DIAGNOSIS — R52 Pain, unspecified: Secondary | ICD-10-CM | POA: Diagnosis not present

## 2022-06-20 HISTORY — DX: Multiple myeloma not having achieved remission: C90.00

## 2022-06-20 LAB — RAD ONC ARIA SESSION SUMMARY
Course Elapsed Days: 7
Plan Fractions Treated to Date: 6
Plan Prescribed Dose Per Fraction: 2 Gy
Plan Total Fractions Prescribed: 10
Plan Total Prescribed Dose: 20 Gy
Reference Point Dosage Given to Date: 12 Gy
Reference Point Session Dosage Given: 2 Gy
Session Number: 6

## 2022-06-20 LAB — BODY FLUID CULTURE W GRAM STAIN: Culture: NO GROWTH

## 2022-06-20 LAB — COMPREHENSIVE METABOLIC PANEL
ALT: 28 U/L (ref 0–44)
AST: 29 U/L (ref 15–41)
Albumin: 3.3 g/dL — ABNORMAL LOW (ref 3.5–5.0)
Alkaline Phosphatase: 73 U/L (ref 38–126)
Anion gap: 7 (ref 5–15)
BUN: 35 mg/dL — ABNORMAL HIGH (ref 8–23)
CO2: 22 mmol/L (ref 22–32)
Calcium: 8.6 mg/dL — ABNORMAL LOW (ref 8.9–10.3)
Chloride: 115 mmol/L — ABNORMAL HIGH (ref 98–111)
Creatinine, Ser: 2.41 mg/dL — ABNORMAL HIGH (ref 0.44–1.00)
GFR, Estimated: 22 mL/min — ABNORMAL LOW (ref >60)
Glucose, Bld: 92 mg/dL (ref 70–99)
Potassium: 2.9 mmol/L — ABNORMAL LOW (ref 3.5–5.1)
Sodium: 144 mmol/L (ref 135–145)
Total Bilirubin: 0.7 mg/dL (ref 0.3–1.2)
Total Protein: 6.4 g/dL — ABNORMAL LOW (ref 6.5–8.1)

## 2022-06-20 LAB — CBC WITH DIFFERENTIAL/PLATELET
Abs Immature Granulocytes: 0.12 10*3/uL — ABNORMAL HIGH (ref 0.00–0.07)
Basophils Absolute: 0 10*3/uL (ref 0.0–0.1)
Basophils Relative: 0 %
Eosinophils Absolute: 0 10*3/uL (ref 0.0–0.5)
Eosinophils Relative: 0 %
HCT: 31.9 % — ABNORMAL LOW (ref 36.0–46.0)
Hemoglobin: 10.2 g/dL — ABNORMAL LOW (ref 12.0–15.0)
Immature Granulocytes: 2 %
Lymphocytes Relative: 7 %
Lymphs Abs: 0.6 10*3/uL — ABNORMAL LOW (ref 0.7–4.0)
MCH: 27.5 pg (ref 26.0–34.0)
MCHC: 32 g/dL (ref 30.0–36.0)
MCV: 86 fL (ref 80.0–100.0)
Monocytes Absolute: 1.5 10*3/uL — ABNORMAL HIGH (ref 0.1–1.0)
Monocytes Relative: 19 %
Neutro Abs: 5.6 10*3/uL (ref 1.7–7.7)
Neutrophils Relative %: 72 %
Platelets: 160 10*3/uL (ref 150–400)
RBC: 3.71 MIL/uL — ABNORMAL LOW (ref 3.87–5.11)
RDW: 17.7 % — ABNORMAL HIGH (ref 11.5–15.5)
WBC: 7.8 10*3/uL (ref 4.0–10.5)
nRBC: 0 % (ref 0.0–0.2)

## 2022-06-20 MED ORDER — POTASSIUM CHLORIDE 10 MEQ/100ML IV SOLN
10.0000 meq | INTRAVENOUS | Status: AC
  Administered 2022-06-20 (×3): 10 meq via INTRAVENOUS
  Filled 2022-06-20 (×3): qty 100

## 2022-06-20 NOTE — Progress Notes (Signed)
[facility]  PROGRESS NOTE  Michelle [name] GMW:[id number] DOB: 05/29/1961 DOA: 06/14/2022 PCP: Leeroy Cha, MD   Brief HPI:   61 year old with past medical history significant for bony metastasis likely secondary to multiple myeloma, pathology pending, hypertension, hypothyroidism, hyperlipidemia who presented with worsening intractable back pain.  She has had worsening back pain due to her metastatic disease.  MRI 8/3 showed worsening widespread tumor infiltrate with new extraosseous tumor in the ventral epidural space. Patient started on radiation therapy on 8/10 Also found to have moderate right pleural effusion which appeared to be loculated She underwent Port-A-Cath placement and thoracentesis on 06/17/2022   Subjective   Patient seen and examined, oncology has decided to start patient on chemotherapy while inpatient for multiple myeloma.  Cytology from pleural effusion came back as multiple myeloma.   Assessment/Plan:     Intractable pain -Widespread tumor infiltration with new extraosseous tumor in the ventral epidural space as per MRI from 8/3 -Pathology from bone marrow biopsy showed hypercellular bone marrow with plasma cell neoplasm -Free kappa light chain 1.153, M spike 1.8 -Dr. Marin Olp following -Started radiation treatment on 8/10 under care of Dr. Tammi Klippel -Continue Decadron 20 mg IV daily -Fentanyl patch increased from 12.5 mcg to 25 mcg to 72 hours -Continue oral hydromorphone as needed every 6 hours -Underwent Port-A-Cath placement on 06/17/2022  Moderate right pleural effusion -Underwent thoracentesis on 06/17/2022 yielding 1.2 L of exudative fluid - Started  IV Ceftriaxone and Zithromax, Protein ration 0.6, LDH ratio 1.6, WBC 16,000 - Follow pleural fluid culture: no growth , Cytology is consistent with plasma cell dyscrasia, multiple myeloma Repeated chest x ray 8/15: Near complete resolution of right effusion, no pneumothorax. -Pulmonology  recommends to discontinue azithromycin today -And switch IV ceftriaxone to Omnicef 10 mg p.o. twice daily for 5 days  Sinus tachycardia -Unclear etiology, VQ scan was negative for PE -Started on low-dose metoprolol 25 mg p.o. twice daily  Acute kidney injury in setting of multiple myeloma -Patient presented with creatinine of 2.0, baseline creatinine 1.0 -In setting of multiple myeloma -Creatinine has improved to 2.13 -Continue IV normal saline at 50 mL/h  Hypokalemia -Potassium is 2.9 -We will replace potassium and follow BMP in am  Hyperlipidemia -Crestor 10 mg daily  Hypothyroidism -Continue Synthroid  Hypercalcemia -Presented with calcium at 13.8. -Secondary to multiple myeloma  -Received IV Zometa, calcitonin - Ca down to 8.2  Hypertension -Continue to hold lisinopril HCTZ due to acute kidney injury and hypercalcemia -Continue metoprolol 25 mg p.o. twice daily as above  Anemia of chronic disease -Anemia secondary to multiple myeloma -Patient received 2 units PRBC on 06/17/2022 -Hemoglobin is stable at 10.2  Medications     Chlorhexidine Gluconate Cloth  6 each Topical Daily   cholecalciferol  1,000 Units Oral Daily   dronabinol  5 mg Oral BID AC   enoxaparin (LOVENOX) injection  30 mg Subcutaneous Q24H   famciclovir  500 mg Oral Daily   fentaNYL  1 patch Transdermal Q72H   fluconazole  100 mg Oral Daily   HYDROmorphone  2 mg Oral Q6H   lactulose  20 g Oral TID   levothyroxine  88 mcg Oral Q0600   metoprolol tartrate  25 mg Oral BID   multivitamin with minerals  1 tablet Oral Daily   pantoprazole  40 mg Oral BID   potassium chloride  20 mEq Oral Once   protein supplement  1 Scoop Oral TID WC   rosuvastatin  10 mg Oral Daily  Data Reviewed:   CBG:  Recent Labs  Lab 06/17/22 1629  GLUCAP 138*    SpO2: 98 % O2 Flow Rate (L/min): 2 L/min    Vitals:   06/19/22 1213 06/19/22 2024 06/20/22 0542 06/20/22 1406  BP: (!) 146/81 134/72 (!) 151/89  (!) 158/82  Pulse: (!) 110 (!) 107 100 100  Resp: 18 19 20 18  Temp: 97.7 F (36.5 C) 98.2 F (36.8 C) 97.8 F (36.6 C) (!) 97.4 F (36.3 C)  TempSrc: Oral Oral Oral Oral  SpO2: 100% 96% 100% 98%  Weight:      Height:          Data Reviewed:  Basic Metabolic Panel: Recent Labs  Lab 06/16/22 0403 06/17/22 0336 06/18/22 0134 06/19/22 0330 06/20/22 0331  NA 138 138 142 143 144  K 3.7 3.8 4.0 3.4* 2.9*  CL 106 106 111 114* 115*  CO2 22 22 22 22 22  GLUCOSE 130* 132* 122* 104* 92  BUN 43* 49* 49* 39* 35*  CREATININE 2.05* 2.10* 2.47* 2.13* 2.41*  CALCIUM 10.6* 9.4 9.5 8.2* 8.6*    CBC: Recent Labs  Lab 06/16/22 0403 06/17/22 0336 06/18/22 0134 06/19/22 0330 06/20/22 0331  WBC 11.9* 13.2* 12.5* 8.3 7.8  NEUTROABS 8.5* 9.7* 8.8* 5.9 5.6  HGB 8.1* 7.9* 10.8* 9.5* 10.2*  HCT 24.7* 24.3* 32.4* 28.9* 31.9*  MCV 82.3 82.4 82.7 83.8 86.0  PLT 164 174 176 148* 160    LFT Recent Labs  Lab 06/16/22 0403 06/17/22 0336 06/18/22 0134 06/19/22 0330 06/20/22 0331  AST 25 23 29  29 29 29  ALT 24 20 26  25 26 28  ALKPHOS 74 73 77  75 62 73  BILITOT 0.7 0.5 0.7  0.7 0.4 0.7  PROT 6.9 7.0 7.2  7.1 6.0* 6.4*  ALBUMIN 3.6 3.6 3.7  3.6 3.0* 3.3*     Antibiotics: Anti-infectives (From admission, onward)    Start     Dose/Rate Route Frequency Ordered Stop   06/19/22 1000  azithromycin (ZITHROMAX) tablet 500 mg        500 mg Oral  Once 06/19/22 0843 06/19/22 1058   06/19/22 1000  cefdinir (OMNICEF) capsule 300 mg  Status:  Discontinued        300 mg Oral Every 12 hours 06/19/22 0843 06/20/22 0723   06/17/22 2000  azithromycin (ZITHROMAX) 500 mg in sodium chloride 0.9 % 250 mL IVPB  Status:  Discontinued        500 mg 250 mL/hr over 60 Minutes Intravenous Every 24 hours 06/17/22 1741 06/19/22 0843   06/17/22 1830  cefTRIAXone (ROCEPHIN) 2 g in sodium chloride 0.9 % 100 mL IVPB  Status:  Discontinued        2 g 200 mL/hr over 30 Minutes Intravenous Every 24 hours  06/17/22 1740 06/19/22 0843   06/15/22 1000  fluconazole (DIFLUCAN) tablet 100 mg        100 mg Oral Daily 06/14/22 2315     06/15/22 1000  famciclovir (FAMVIR) tablet 500 mg        500 mg Oral Daily 06/15/22 0703          DVT prophylaxis: Lovenox  Code Status: Full code  Family Communication: Discussed with daughter at bedside   CONSULTS oncology   Objective    Physical Examination:  General-appears in no acute distress Heart-S1-S2, regular, no murmur auscultated Lungs-clear to auscultation bilaterally, no wheezing or crackles auscultated Abdomen-soft, nontender, no organomegaly Extremities-no edema in the   lower extremities Neuro-alert, oriented x3, no focal deficit noted   Status is: Inpatient:              S    [facility] If 7PM-7AM, please contact night-coverage at [email or web address], Office  [phone]   06/20/2022, 2:38 PM  LOS: 6 days              

## 2022-06-20 NOTE — Progress Notes (Signed)
Report given to 6E RN for patient transfer. RN reports no further questions at this time.   Layla Maw, RN

## 2022-06-20 NOTE — Progress Notes (Signed)
START ON PATHWAY REGIMEN - Multiple Myeloma and Other Plasma Cell Dyscrasias     A cycle is every 21 days:     Dexamethasone      Bortezomib      Cyclophosphamide   **Always confirm dose/schedule in your pharmacy ordering system**  Patient Characteristics: Multiple Myeloma, Newly Diagnosed, Transplant Eligible, High Risk Disease Classification: Multiple Myeloma R-ISS Staging: III Therapeutic Status: Newly Diagnosed Is Patient Eligible for Transplant<= Transplant Eligible Risk Status: High Risk Intent of Therapy: Non-Curative / Palliative Intent, Discussed with Patient

## 2022-06-20 NOTE — Progress Notes (Signed)
Patient urine occurrences have had stool mixed in that was unable to be added to 24-hour urine clearance test, both overnight and this morning. Hospitalist and Oncologist made aware. Will continue to collect useable urine occurrences.  Layla Maw, RN

## 2022-06-20 NOTE — Progress Notes (Signed)
The cytology on the pleural effusion shows that this is myeloma.  This clearly indicates, to me, that this myeloma is aggressive.  I really think we have to get started with treatment as an inpatient to try to prevent this fluid from coming back.  I put the orders in for Cytoxan/Velcade/Decadron.  I cannot use Revlimid because of her renal insufficiency.  I cannot use daratumumab as an inpatient.  We can do this as an outpatient.  She did have some physical therapy yesterday.  It sound like she did fairly well.  She is having bowel movements.  She is having some reflux.  She is on Protonix.  She seems to be eating maybe a little bit better.  Again, I think we have to get started with treatment as an inpatient.  She has a Port-A-Cath in already.  We can just transfer her up to 6 E.  I talked to the nurses up on 6 E.  They are aware of her.  Her labs show a white cell count of 7.8.  Hemoglobin 10.2.  Platelet count 160,000.  Her potassium is low at 2.9.  Her calcium was 8.6 with albumin 3.3.  Her BUN 35 creatinine 2.41.  I am trying to do another 24-hour urine to see how much like changes in her urine.  I still have no clue as to what happened to the first 24-hour urine.  She had enough urine to do the studies.  She is getting radiation therapy to her back.  This hopefully will help with her back pain.  She is on a fentanyl patch and hydromorphone.  At this point, all the studies have been done that need to be done from my point of view.  We do need to get her started on treatment.  Again, if we can just get her up to 6 E.  The orders are all in.  I will have my pharmacist in the office connected with the [facility] long pharmacists.  I appreciate the wonderful care that she has received from everybody on 4 W.  [name], MD  [place] 14:14

## 2022-06-20 NOTE — Progress Notes (Signed)
NAME:  Michelle [name], MRN:  [id number], DOB:  03/08/1961, LOS: 6 ADMISSION DATE:  06/14/2022, CONSULTATION DATE: 06/18/2022 REFERRING MD: Dr.Regalado, CHIEF COMPLAINT: Pleural effusion  History of Present Illness:  Michelle [name] is a 61 year old female with a past medical history significant for bony metastasis likely secondary to multiple myeloma hypertension, hyperlipidemia, and hypothyroidism who presented to the emergency department 8/11 for intractable back pain secondary to bony metastasis.  Note patient underwent MRI 8/3 which revealed worsening widespread tumor infiltration new extraosseous tumors ventral epidural space.  First session of palliative radiation was 8/10   On ED arrival patient was seen hemodynamically stable with tachycardia.  Pertinent lab work included AKI with a creatinine up to 2,  hypercalcemia, and elevated total bilirubin.  8/12 patient had chest x-ray performed which revealed partially loculated moderate right pleural effusion.  Interventional radiology was consulted 8/14 and patient underwent diagnostic and therapeutic right thoracentesis with 1.2 L of fluid removed.  8/15 PCCM consulted for further management of pleural effusion.    Pertinent  Medical History  Bony metastasis likely secondary to multiple myeloma hypertension, hyperlipidemia, and hypothyroidism   Significant Hospital Events: Including procedures, antibiotic start and stop dates in addition to other pertinent events   8/11 presented for intractable pain in the setting of bony metastasis  8/12 patient had chest x-ray performed which revealed partially loculated moderate right pleural effusion.   8/14 Interventional radiology was consulted  and patient underwent diagnostic and therapeutic right thoracentesis with 1.2 L of fluid removed.   8/15 PCCM consulted for further management of pleural effusion. 8/17 pleural fluid cytology consistent with myeloma  Interim History / Subjective:  No overnight  events Appears comfortable  Objective   Blood pressure (!) 151/89, pulse 100, temperature 97.8 F (36.6 C), temperature source Oral, resp. rate 20, height 5' 4.25" (1.632 m), weight 89.5 kg, SpO2 100 %.        Intake/Output Summary (Last 24 hours) at 06/20/2022 1020 Last data filed at 06/20/2022 0900 Gross per 24 hour  Intake 704.55 ml  Output 650 ml  Net 54.55 ml   Filed Weights   06/14/22 1534  Weight: 89.5 kg    Examination: General: Chronically ill-appearing  HENT: Moist oral mucosa Lungs: Decreased air entry bilaterally Cardiovascular: S1-S2 appreciated Abdomen: Soft, bowel sounds appreciated Extremities: No clubbing, no edema Neuro: Alert and oriented x3, moving all extremities  GU: [facility] Problem list     Assessment & Plan:  Loculated pleural effusion s/p thoracentesis -Had 1.2 L removed -Exudative fluid consistent with patient's diagnosis of myeloma -Repeat CT still shows bilateral pleural effusion  Patient was on empiric antibiotics for pneumonia -Leukocytosis did improve -We will complete course of antibiotics  Encouraged to mobilize as tolerated  Extensive destruction of right ribs  Patient is being started on inpatient chemo -Cytoxan, Velcade, Decadron   Labs   CBC: Recent Labs  Lab 06/16/22 0403 06/17/22 0336 06/18/22 0134 06/19/22 0330 06/20/22 0331  WBC 11.9* 13.2* 12.5* 8.3 7.8  NEUTROABS 8.5* 9.7* 8.8* 5.9 5.6  HGB 8.1* 7.9* 10.8* 9.5* 10.2*  HCT 24.7* 24.3* 32.4* 28.9* 31.9*  MCV 82.3 82.4 82.7 83.8 86.0  PLT 164 174 176 148* 336    Basic Metabolic Panel: Recent Labs  Lab 06/16/22 0403 06/17/22 0336 06/18/22 0134 06/19/22 0330 06/20/22 0331  NA 138 138 142 143 144  K 3.7 3.8 4.0 3.4* 2.9*  CL 106 106 111 114* 115*  CO2 22 22 22  22 22  GLUCOSE 130* 132* 122* 104* 92  BUN 43* 49* 49* 39* 35*  CREATININE 2.05* 2.10* 2.47* 2.13* 2.41*  CALCIUM 10.6* 9.4 9.5 8.2* 8.6*   GFR: Estimated Creatinine  Clearance: 26 mL/min (A) (by C-G formula based on SCr of 2.41 mg/dL (H)). Recent Labs  Lab 06/17/22 0336 06/18/22 0134 06/19/22 0330 06/20/22 0331  WBC 13.2* 12.5* 8.3 7.8    Liver Function Tests: Recent Labs  Lab 06/16/22 0403 06/17/22 0336 06/18/22 0134 06/19/22 0330 06/20/22 0331  AST $Re'25 23 29  29 29 29  'Qwz$ ALT $R'24 20 26  25 26 28  'It$ ALKPHOS 74 73 77  75 62 73  BILITOT 0.7 0.5 0.7  0.7 0.4 0.7  PROT 6.9 7.0 7.2  7.1 6.0* 6.4*  ALBUMIN 3.6 3.6 3.7  3.6 3.0* 3.3*   No results for input(s): "LIPASE", "AMYLASE" in the last 168 hours. No results for input(s): "AMMONIA" in the last 168 hours.  ABG No results found for: "PHART", "PCO2ART", "PO2ART", "HCO3", "TCO2", "ACIDBASEDEF", "O2SAT"   Coagulation Profile: No results for input(s): "INR", "PROTIME" in the last 168 hours.  Cardiac Enzymes: No results for input(s): "CKTOTAL", "CKMB", "CKMBINDEX", "TROPONINI" in the last 168 hours.  HbA1C: Hgb A1c MFr Bld  Date/Time Value Ref Range Status  05/09/2010 08:54 PM 5.5 <5.7 % Final    Comment:    See lab report for associated comment(s)  12/11/2009 08:24 PM 6.1 4.6 - 6.1 % Final    Comment:    See lab report for associated comment(s)    CBG: Recent Labs  Lab 06/17/22 1629  GLUCAP 138*    Review of Systems:   Pain is better  Past Medical History:  She,  has a past medical history of Cancer ([place]), Essential hypertension (02/16/2017), Hyperlipemia, Hyperlipidemia (02/16/2017), Hypertension, Hypothyroidism (02/16/2017), Morbid obesity ([place]) (02/16/2017), and Multiple myeloma without remission ([place]) (06/20/2022).   Surgical History:   Past Surgical History:  Procedure Laterality Date   IR IMAGING GUIDED PORT INSERTION  06/17/2022   IR THORACENTESIS ASP PLEURAL SPACE W/IMG GUIDE  06/17/2022   THYROIDECTOMY       Social History:   reports that she has quit smoking. She has never used smokeless tobacco. She reports that she does not drink alcohol and does not use  drugs.   Family History:  Her family history includes Diabetes in her father and paternal grandfather; Heart attack in her maternal grandmother and paternal grandfather; Hypertension in her father, mother, and sister; Kidney disease in her mother; Lung cancer in her father; Stroke in her father, paternal grandfather, and paternal grandmother.   Allergies Allergies  Allergen Reactions   Penicillins Other (See Comments)    Severe Yeast infection... No deathly reactions   Codeine Nausea Only   Morphine Other (See Comments)    headache   Tramadol     headache    Sherrilyn Rist, MD [place] [facility] Pager: See Shea Evans

## 2022-06-21 ENCOUNTER — Ambulatory Visit
Admission: RE | Admit: 2022-06-21 | Discharge: 2022-06-21 | Disposition: A | Discharge status: Home / Self Care | Payer: BC Managed Care – PPO | Source: Ambulatory Visit | Attending: Radiation Oncology | Admitting: Radiation Oncology

## 2022-06-21 ENCOUNTER — Other Ambulatory Visit: Payer: Self-pay

## 2022-06-21 DIAGNOSIS — I10 Essential (primary) hypertension: Secondary | ICD-10-CM | POA: Diagnosis not present

## 2022-06-21 DIAGNOSIS — M5441 Lumbago with sciatica, right side: Secondary | ICD-10-CM

## 2022-06-21 DIAGNOSIS — R52 Pain, unspecified: Secondary | ICD-10-CM | POA: Diagnosis not present

## 2022-06-21 DIAGNOSIS — C9000 Multiple myeloma not having achieved remission: Secondary | ICD-10-CM | POA: Diagnosis not present

## 2022-06-21 DIAGNOSIS — N179 Acute kidney failure, unspecified: Secondary | ICD-10-CM | POA: Diagnosis not present

## 2022-06-21 DIAGNOSIS — N289 Disorder of kidney and ureter, unspecified: Secondary | ICD-10-CM | POA: Diagnosis not present

## 2022-06-21 LAB — CBC WITH DIFFERENTIAL/PLATELET
Abs Immature Granulocytes: 0.12 10*3/uL — ABNORMAL HIGH (ref 0.00–0.07)
Basophils Absolute: 0 10*3/uL (ref 0.0–0.1)
Basophils Relative: 0 %
Eosinophils Absolute: 0.1 10*3/uL (ref 0.0–0.5)
Eosinophils Relative: 1 %
HCT: 32.8 % — ABNORMAL LOW (ref 36.0–46.0)
Hemoglobin: 10.4 g/dL — ABNORMAL LOW (ref 12.0–15.0)
Immature Granulocytes: 2 %
Lymphocytes Relative: 7 %
Lymphs Abs: 0.5 10*3/uL — ABNORMAL LOW (ref 0.7–4.0)
MCH: 27.4 pg (ref 26.0–34.0)
MCHC: 31.7 g/dL (ref 30.0–36.0)
MCV: 86.3 fL (ref 80.0–100.0)
Monocytes Absolute: 1.5 10*3/uL — ABNORMAL HIGH (ref 0.1–1.0)
Monocytes Relative: 20 %
Neutro Abs: 5.5 10*3/uL (ref 1.7–7.7)
Neutrophils Relative %: 70 %
Platelets: 157 10*3/uL (ref 150–400)
RBC: 3.8 MIL/uL — ABNORMAL LOW (ref 3.87–5.11)
RDW: 18 % — ABNORMAL HIGH (ref 11.5–15.5)
WBC: 7.7 10*3/uL (ref 4.0–10.5)
nRBC: 0 % (ref 0.0–0.2)

## 2022-06-21 LAB — COMPREHENSIVE METABOLIC PANEL
ALT: 32 U/L (ref 0–44)
AST: 40 U/L (ref 15–41)
Albumin: 3.5 g/dL (ref 3.5–5.0)
Alkaline Phosphatase: 80 U/L (ref 38–126)
Anion gap: 7 (ref 5–15)
BUN: 27 mg/dL — ABNORMAL HIGH (ref 8–23)
CO2: 21 mmol/L — ABNORMAL LOW (ref 22–32)
Calcium: 8.8 mg/dL — ABNORMAL LOW (ref 8.9–10.3)
Chloride: 118 mmol/L — ABNORMAL HIGH (ref 98–111)
Creatinine, Ser: 2.63 mg/dL — ABNORMAL HIGH (ref 0.44–1.00)
GFR, Estimated: 20 mL/min — ABNORMAL LOW (ref >60)
Glucose, Bld: 94 mg/dL (ref 70–99)
Potassium: 3.4 mmol/L — ABNORMAL LOW (ref 3.5–5.1)
Sodium: 146 mmol/L — ABNORMAL HIGH (ref 135–145)
Total Bilirubin: 0.8 mg/dL (ref 0.3–1.2)
Total Protein: 6.7 g/dL (ref 6.5–8.1)

## 2022-06-21 LAB — RAD ONC ARIA SESSION SUMMARY
Course Elapsed Days: 8
Plan Fractions Treated to Date: 7
Plan Prescribed Dose Per Fraction: 2 Gy
Plan Total Fractions Prescribed: 10
Plan Total Prescribed Dose: 20 Gy
Reference Point Dosage Given to Date: 14 Gy
Reference Point Session Dosage Given: 2 Gy
Session Number: 7

## 2022-06-21 LAB — BASIC METABOLIC PANEL
Anion gap: 8 (ref 5–15)
BUN: 26 mg/dL — ABNORMAL HIGH (ref 8–23)
CO2: 18 mmol/L — ABNORMAL LOW (ref 22–32)
Calcium: 8.4 mg/dL — ABNORMAL LOW (ref 8.9–10.3)
Chloride: 118 mmol/L — ABNORMAL HIGH (ref 98–111)
Creatinine, Ser: 2.68 mg/dL — ABNORMAL HIGH (ref 0.44–1.00)
GFR, Estimated: 19 mL/min — ABNORMAL LOW (ref >60)
Glucose, Bld: 164 mg/dL — ABNORMAL HIGH (ref 70–99)
Potassium: 3.6 mmol/L (ref 3.5–5.1)
Sodium: 144 mmol/L (ref 135–145)

## 2022-06-21 LAB — GLUCOSE, CAPILLARY: Glucose-Capillary: 139 mg/dL — ABNORMAL HIGH (ref 70–99)

## 2022-06-21 MED ORDER — BORTEZOMIB CHEMO SQ INJECTION 3.5 MG (2.5MG/ML)
1.3000 mg/m2 | Freq: Once | INTRAMUSCULAR | Status: AC
  Administered 2022-06-21: 2.5 mg via SUBCUTANEOUS
  Filled 2022-06-21: qty 1

## 2022-06-21 MED ORDER — SODIUM CHLORIDE 0.9 % IV SOLN
500.0000 mg/m2 | Freq: Once | INTRAVENOUS | Status: AC
  Administered 2022-06-21: 1000 mg via INTRAVENOUS
  Filled 2022-06-21: qty 50

## 2022-06-21 MED ORDER — SODIUM CHLORIDE 0.9 % IV SOLN
20.0000 mg | Freq: Once | INTRAVENOUS | Status: AC
  Administered 2022-06-21: 20 mg via INTRAVENOUS
  Filled 2022-06-21: qty 2

## 2022-06-21 MED ORDER — PALONOSETRON HCL INJECTION 0.25 MG/5ML
0.2500 mg | Freq: Once | INTRAVENOUS | Status: AC
  Administered 2022-06-21: 0.25 mg via INTRAVENOUS
  Filled 2022-06-21: qty 5

## 2022-06-21 MED ORDER — POTASSIUM CHLORIDE CRYS ER 20 MEQ PO TBCR
20.0000 meq | EXTENDED_RELEASE_TABLET | Freq: Once | ORAL | Status: AC
Start: 2022-06-21 — End: 2022-06-21
  Administered 2022-06-21: 20 meq via ORAL
  Filled 2022-06-21: qty 1

## 2022-06-21 MED ORDER — DEXTROSE-NACL 5-0.45 % IV SOLN
INTRAVENOUS | Status: DC
  Administered 2022-06-23: 1000 mL via INTRAVENOUS

## 2022-06-21 NOTE — Progress Notes (Signed)
Michelle [name] is now up on 6 E.  I appreciate everybody's help with trying to get her up there.  She will do chemotherapy today.  She will get Velcade/Cytoxan.  This should only take about an hour to do.  She is still getting radiation therapy.  I think she really wants to go home this weekend.  She will be willing to do radiation as an outpatient.  I do not see a problem with her going home over the weekend.  Her labs today show sodium 146.  Potassium 3.4.  BUN 27 creatinine 2.63.  Her white cell count is 7.7.  Hemoglobin 10.4.  Platelet 157,000.  She is still urinating.  Again, the renal function is a little bit decreased.  I have to believe this is from the myeloma the light chains.  I will have to believe that treatment should be able to help open up her kidneys relatively quickly.  She still has a little bit of heartburn.  She is doing okay with pain.  She seems to be on a decent pain regimen.  She seems to be eating maybe a little bit better.  There is no obvious diarrhea when she tells me.  Her vital signs are temperature 98.  Pulse 99.  Blood pressure 148/84.  Her lungs sound clear bilaterally.  Cardiac exam regular rate and rhythm.  She has no murmurs.  Abdomen is soft.  Bowel sounds are present.  There is no guarding or rebound tenderness.  Extremity shows no clubbing, cyanosis or edema.  Neurological exam is nonfocal.  Again, if the primary team feels that she is able to go home, family oncologic point of view, I have no problems with this.  She can get radiation therapy today.  She can finish radiation as an outpatient.  The chemotherapy protocol we have her on is really not that emetogenic.  I think that being in the hospital is just wearing on her.  I do know that she has gotten wonderful care from everybody.  [name], MD  [facility] 1:37

## 2022-06-21 NOTE — Plan of Care (Signed)
  Problem: Clinical Measurements: Goal: Ability to maintain clinical measurements within normal limits will improve Outcome: Progressing   Problem: Clinical Measurements: Goal: Will remain free from infection Outcome: Progressing   Problem: Clinical Measurements: Goal: Diagnostic test results will improve Outcome: Progressing   Problem: Activity: Goal: Risk for activity intolerance will decrease Outcome: Progressing   Problem: Nutrition: Goal: Adequate nutrition will be maintained Outcome: Progressing   Problem: Coping: Goal: Level of anxiety will decrease Outcome: Progressing

## 2022-06-21 NOTE — Progress Notes (Signed)
[facility]  PROGRESS NOTE  Michelle [name] WJX:[id number] DOB: 21-Feb-1961 DOA: 06/14/2022 PCP: Leeroy Cha, MD   Brief HPI:   61 year old with past medical history significant for bony metastasis likely secondary to multiple myeloma, pathology pending, hypertension, hypothyroidism, hyperlipidemia who presented with worsening intractable back pain.  She has had worsening back pain due to her metastatic disease.  MRI 8/3 showed worsening widespread tumor infiltrate with new extraosseous tumor in the ventral epidural space. Patient started on radiation therapy on 8/10 Also found to have moderate right pleural effusion which appeared to be loculated She underwent Port-A-Cath placement and thoracentesis on 06/17/2022   Subjective   Patient seen and examined, plan to get Velcade/Cytoxan today.  Denies any complaints.   Assessment/Plan:     Intractable pain/multiple myeloma -Widespread tumor infiltration with new extraosseous tumor in the ventral epidural space as per MRI from 8/3 -Pathology from bone marrow biopsy showed hypercellular bone marrow with plasma cell neoplasm -Free kappa light chain 1.153, M spike 1.8 -Dr. Marin Olp following -Started radiation treatment on 8/10 under care of Dr. Tammi Klippel -Continue Decadron 20 mg IV daily -Fentanyl patch increased from 12.5 mcg to 25 mcg to 72 hours -Continue oral hydromorphone as needed every 6 hours -Underwent Port-A-Cath placement on 06/17/2022 -Plan for Velcade/Cytoxan today Patient also getting radiation treatment --Discharge home in a.m.  Moderate right pleural effusion -Underwent thoracentesis on 06/17/2022 yielding 1.2 L of exudative fluid - Started  IV Ceftriaxone and Zithromax, Protein ration 0.6, LDH ratio 1.6, WBC 16,000 - Follow pleural fluid culture: no growth , Cytology is consistent with plasma cell dyscrasia, multiple myeloma Repeated chest x ray 8/15: Near complete resolution of right effusion, no  pneumothorax. -Pulmonology recommends to discontinue azithromycin today -And switch IV ceftriaxone to Omnicef 10 mg p.o. twice daily for 5 days  Sinus tachycardia -Unclear etiology, VQ scan was negative for PE -Started on low-dose metoprolol 25 mg p.o. twice daily  Acute kidney injury in setting of multiple myeloma -Patient presented with creatinine of 2.0, baseline creatinine 1.0 -In setting of multiple myeloma -Creatinine is elevated today to 2.63 -Sodium is up to 146 -Start D5 half-normal saline at 75 mill per hour   Hypokalemia -Potassium is 3.4 this morning -We will replace potassium and follow BMP in am  Hyperlipidemia -Crestor 10 mg daily  Hypothyroidism -Continue Synthroid  Hypercalcemia -Presented with calcium at 13.8. -Secondary to multiple myeloma  -Received IV Zometa, calcitonin - Ca down to 8.2  Hypertension -Continue to hold lisinopril HCTZ due to acute kidney injury and hypercalcemia -Continue metoprolol 25 mg p.o. twice daily as above  Anemia of chronic disease -Anemia secondary to multiple myeloma -Patient received 2 units PRBC on 06/17/2022 -Hemoglobin is stable at 10.4  Medications     bortezomib SQ  1.3 mg/m2 (Treatment Plan Recorded) Subcutaneous Once   Chlorhexidine Gluconate Cloth  6 each Topical Daily   cholecalciferol  1,000 Units Oral Daily   cyclophosphamide  500 mg/m2 (Treatment Plan Recorded) Intravenous Once   dronabinol  5 mg Oral BID AC   enoxaparin (LOVENOX) injection  30 mg Subcutaneous Q24H   famciclovir  500 mg Oral Daily   fentaNYL  1 patch Transdermal Q72H   fluconazole  100 mg Oral Daily   HYDROmorphone  2 mg Oral Q6H   lactulose  20 g Oral TID   levothyroxine  88 mcg Oral Q0600   metoprolol tartrate  25 mg Oral BID   multivitamin with minerals  1 tablet Oral Daily  palonosetron  0.25 mg Intravenous Once   pantoprazole  40 mg Oral BID   potassium chloride  20 mEq Oral Once   protein supplement  1 Scoop Oral TID WC    rosuvastatin  10 mg Oral Daily     Data Reviewed:   CBG:  Recent Labs  Lab 06/17/22 1629  GLUCAP 138*    SpO2: 96 % O2 Flow Rate (L/min): 2 L/min    Vitals:   06/20/22 1708 06/20/22 2020 06/20/22 2101 06/21/22 0128  BP: (!) 162/85  (!) 144/80 (!) 148/84  Pulse: (!) 107  (!) 104 99  Resp: _0 Temp: 97.8 F (36.6 C)  98.6 F (37 C) 98 F (36.7 C)  TempSrc: Oral  Oral Oral  SpO2: 94%  98% 96%  Weight:      Height:          Data Reviewed:  Basic Metabolic Panel: Recent Labs  Lab 06/17/22 0336 06/18/22 0134 06/19/22 0330 06/20/22 0331 06/21/22 0557  NA 138 142 143 144 146*  K 3.8 4.0 3.4* 2.9* 3.4*  CL 106 111 114* 115* 118*  CO2 _1 21*  GLUCOSE 132* 122* 104* 92 94  BUN 49* 49* 39* 35* 27*  CREATININE 2.10* 2.47* 2.13* 2.41* 2.63*  CALCIUM 9.4 9.5 8.2* 8.6* 8.8*    CBC: Recent Labs  Lab 06/17/22 0336 06/18/22 0134 06/19/22 0330 06/20/22 0331 06/21/22 0557  WBC 13.2* 12.5* 8.3 7.8 7.7  NEUTROABS 9.7* 8.8* 5.9 5.6 5.5  HGB 7.9* 10.8* 9.5* 10.2* 10.4*  HCT 24.3* 32.4* 28.9* 31.9* 32.8*  MCV 82.4 82.7 83.8 86.0 86.3  PLT 174 176 148* 160 157    LFT Recent Labs  Lab 06/17/22 0336 06/18/22 0134 06/19/22 0330 06/20/22 0331 06/21/22 0557  AST _2 40  ALT _3 32  ALKPHOS 73 77  75 62 73 80  BILITOT 0.5 0.7  0.7 0.4 0.7 0.8  PROT 7.0 7.2  7.1 6.0* 6.4* 6.7  ALBUMIN 3.6 3.7  3.6 3.0* 3.3* 3.5     Antibiotics: Anti-infectives (From admission, onward)    Start     Dose/Rate Route Frequency Ordered Stop   06/19/22 1000  azithromycin (ZITHROMAX) tablet 500 mg        500 mg Oral  Once 06/19/22 0843 06/19/22 1058   06/19/22 1000  cefdinir (OMNICEF) capsule 300 mg  Status:  Discontinued        300 mg Oral Every 12 hours 06/19/22 0843 06/20/22 0723   06/17/22 2000  azithromycin (ZITHROMAX) 500 mg in sodium chloride 0.9 % 250 mL IVPB  Status:  Discontinued        500 mg 250 mL/hr over 60 Minutes  Intravenous Every 24 hours 06/17/22 1741 06/19/22 0843   06/17/22 1830  cefTRIAXone (ROCEPHIN) 2 g in sodium chloride 0.9 % 100 mL IVPB  Status:  Discontinued        2 g 200 mL/hr over 30 Minutes Intravenous Every 24 hours 06/17/22 1740 06/19/22 0843   06/15/22 1000  fluconazole (DIFLUCAN) tablet 100 mg        100 mg Oral Daily 06/14/22 2315     06/15/22 1000  famciclovir (FAMVIR) tablet 500 mg        500 mg Oral Daily 06/15/22 0703          DVT prophylaxis: Lovenox  Code Status: Full code  Family Communication: Discussed with daughter at  bedside   CONSULTS oncology   Objective    Physical Examination:  General-appears in no acute distress Heart-S1-S2, regular, no murmur auscultated Lungs-clear to auscultation bilaterally, no wheezing or crackles auscultated Abdomen-soft, nontender, no organomegaly Extremities-no edema in the lower extremities Neuro-alert, oriented x3, no focal deficit noted   Status is: Inpatient:             Oswald Hillock   [facility] If 7PM-7AM, please contact night-coverage at [email or web address], Office  [phone]   06/21/2022, 9:28 AM  LOS: 7 days

## 2022-06-21 NOTE — Progress Notes (Signed)
NAME:  Michelle [name], MRN:  [id number], DOB:  06-Jun-1961, LOS: 7 ADMISSION DATE:  06/14/2022, CONSULTATION DATE: 06/18/2022 REFERRING MD: Dr.Regalado, CHIEF COMPLAINT: Pleural effusion  History of Present Illness:  Michelle [name] is a 61 year old female with a past medical history significant for bony metastasis likely secondary to multiple myeloma hypertension, hyperlipidemia, and hypothyroidism who presented to the emergency department 8/11 for intractable back pain secondary to bony metastasis.  Note patient underwent MRI 8/3 which revealed worsening widespread tumor infiltration new extraosseous tumors ventral epidural space.  First session of palliative radiation was 8/10   On ED arrival patient was seen hemodynamically stable with tachycardia.  Pertinent lab work included AKI with a creatinine up to 2,  hypercalcemia, and elevated total bilirubin.  8/12 patient had chest x-ray performed which revealed partially loculated moderate right pleural effusion.  Interventional radiology was consulted 8/14 and patient underwent diagnostic and therapeutic right thoracentesis with 1.2 L of fluid removed.  8/15 PCCM consulted for further management of pleural effusion.   Pertinent  Medical History  Bony metastasis likely secondary to multiple myeloma hypertension, hyperlipidemia, and hypothyroidism   Significant Hospital Events: Including procedures, antibiotic start and stop dates in addition to other pertinent events   8/11 presented for intractable pain in the setting of bony metastasis  8/12 patient had chest x-ray performed which revealed partially loculated moderate right pleural effusion.   8/14 Interventional radiology was consulted  and patient underwent diagnostic and therapeutic right thoracentesis with 1.2 L of fluid removed.   8/15 PCCM consulted for further management of pleural effusion. 8/17 pleural fluid cytology consistent with myeloma 8/18 started chemotherapy  Interim History /  Subjective:  No overnight events Pain is better controlled  Objective   Blood pressure (!) 148/84, pulse 99, temperature 98 F (36.7 C), temperature source Oral, resp. rate 20, height 5' 4.25" (1.632 m), weight 89.5 kg, SpO2 96 %.        Intake/Output Summary (Last 24 hours) at 06/21/2022 1032 Last data filed at 06/21/2022 1000 Gross per 24 hour  Intake 714.9 ml  Output 304 ml  Net 410.9 ml   Filed Weights   06/14/22 1534  Weight: 89.5 kg    Examination: General: Chronically ill-appearing, HENT: Moist oral mucosa Lungs: Clear breath sounds, decreased at bases Cardiovascular: S1-S2 appreciated Abdomen: Soft, bowel sounds appreciated Extremities: No clubbing, no edema Neuro: No neurological focality GU: Fair output  Resolved Hospital Problem list     Assessment & Plan:   Loculated pleural effusion s/p thoracentesis -Exudative fluid consistent with patient's diagnosis of myeloma -Post 1.2 L of fluid removal -Repeat CT shows bilateral pleural effusions  Was on empiric antibiotics for pneumonia -Completed  Encouraged to mobilize as tolerated  Extensive destruction of ribs on CT  On chemotherapy  Discharge planning  Labs   CBC: Recent Labs  Lab 06/17/22 0336 06/18/22 0134 06/19/22 0330 06/20/22 0331 06/21/22 0557  WBC 13.2* 12.5* 8.3 7.8 7.7  NEUTROABS 9.7* 8.8* 5.9 5.6 5.5  HGB 7.9* 10.8* 9.5* 10.2* 10.4*  HCT 24.3* 32.4* 28.9* 31.9* 32.8*  MCV 82.4 82.7 83.8 86.0 86.3  PLT 174 176 148* 160 537    Basic Metabolic Panel: Recent Labs  Lab 06/17/22 0336 06/18/22 0134 06/19/22 0330 06/20/22 0331 06/21/22 0557  NA 138 142 143 144 146*  K 3.8 4.0 3.4* 2.9* 3.4*  CL 106 111 114* 115* 118*  CO2 _0 21*  GLUCOSE 132* 122* 104* 92 94  BUN 49*  49* 39* 35* 27*  CREATININE 2.10* 2.47* 2.13* 2.41* 2.63*  CALCIUM 9.4 9.5 8.2* 8.6* 8.8*   GFR: Estimated Creatinine Clearance: 23.8 mL/min (A) (by C-G formula based on SCr of 2.63 mg/dL  (H)). Recent Labs  Lab 06/18/22 0134 06/19/22 0330 06/20/22 0331 06/21/22 0557  WBC 12.5* 8.3 7.8 7.7    Liver Function Tests: Recent Labs  Lab 06/17/22 0336 06/18/22 0134 06/19/22 0330 06/20/22 0331 06/21/22 0557  AST _0 40  ALT _1 32  ALKPHOS 73 77  75 62 73 80  BILITOT 0.5 0.7  0.7 0.4 0.7 0.8  PROT 7.0 7.2  7.1 6.0* 6.4* 6.7  ALBUMIN 3.6 3.7  3.6 3.0* 3.3* 3.5   No results for input(s): "LIPASE", "AMYLASE" in the last 168 hours. No results for input(s): "AMMONIA" in the last 168 hours.  ABG No results found for: "PHART", "PCO2ART", "PO2ART", "HCO3", "TCO2", "ACIDBASEDEF", "O2SAT"   Coagulation Profile: No results for input(s): "INR", "PROTIME" in the last 168 hours.  Cardiac Enzymes: No results for input(s): "CKTOTAL", "CKMB", "CKMBINDEX", "TROPONINI" in the last 168 hours.  HbA1C: Hgb A1c MFr Bld  Date/Time Value Ref Range Status  05/09/2010 08:54 PM 5.5 <5.7 % Final    Comment:    See lab report for associated comment(s)  12/11/2009 08:24 PM 6.1 4.6 - 6.1 % Final    Comment:    See lab report for associated comment(s)    CBG: Recent Labs  Lab 06/17/22 1629  GLUCAP 138*    Review of Systems:   Pain is better  Past Medical History:  She,  has a past medical history of Cancer ([place]), Essential hypertension (02/16/2017), Hyperlipemia, Hyperlipidemia (02/16/2017), Hypertension, Hypothyroidism (02/16/2017), Morbid obesity ([place]) (02/16/2017), and Multiple myeloma without remission ([place]) (06/20/2022).   Surgical History:   Past Surgical History:  Procedure Laterality Date   IR IMAGING GUIDED PORT INSERTION  06/17/2022   IR THORACENTESIS ASP PLEURAL SPACE W/IMG GUIDE  06/17/2022   THYROIDECTOMY       Social History:   reports that she has quit smoking. She has never used smokeless tobacco. She reports that she does not drink alcohol and does not use drugs.   Family History:  Her family history includes Diabetes in her  father and paternal grandfather; Heart attack in her maternal grandmother and paternal grandfather; Hypertension in her father, mother, and sister; Kidney disease in her mother; Lung cancer in her father; Stroke in her father, paternal grandfather, and paternal grandmother.   Allergies Allergies  Allergen Reactions   Penicillins Other (See Comments)    Severe Yeast infection... No deathly reactions   Codeine Nausea Only   Morphine Other (See Comments)    headache   Tramadol     headache    Sherrilyn Rist, MD [place] [facility] Pager: See Shea Evans

## 2022-06-21 NOTE — Progress Notes (Signed)
Physical Therapy Treatment Patient Details Name: Michelle [name] MRN: [id number] DOB: Dec 22, 1960 Today's Date: 06/21/2022   History of Present Illness Michelle [name] is a 61 y.o. female w/ho presents with worsening intractable back pain. Of note, pt had thoracentesis for R pleural effusion 8/14.  PMH: bony metastasis likely secondary to likely multiple myeloma with pathology pending, HTN, hypothyroidism, HLD    PT Comments    General Comments: AxO x 3 sweet Lady and very willing but limited by fatigue/pain.  Assisted OOB required increased time.  General bed mobility comments: increased time, use of bed rail to upright trunk and scoot out to EOB.  General transfer comment: steadying assist, slow movement, pt verbalizes fear of falling, hip extension last, heavy use of hands.  Also assisted with a toilet transfer.  Rest breaks between. General Gait Details: limited distance to and from bathroom due to fatugu and back pain.  Unsteady gait with decreased stance time L LE.  Poor forward flexed posture with excessive lean on walker.  C/O B knee weakness. Assisted back to bed as RN arrived with CHEMO IV meds to hang.   Pt plans to return home with family support.   Recommendations for follow up therapy are one component of a multi-disciplinary discharge planning process, led by the attending physician.  Recommendations may be updated based on patient status, additional functional criteria and insurance authorization.  Follow Up Recommendations  Home health PT     Assistance Recommended at Discharge Intermittent Supervision/Assistance  Patient can return home with the following A little help with walking and/or transfers;A little help with bathing/dressing/bathroom;Assistance with cooking/housework;Assist for transportation   Equipment Recommendations  None recommended by PT    Recommendations for Other Services       Precautions / Restrictions Precautions Precautions: Fall Precaution Comments:  back pain, METS Restrictions Weight Bearing Restrictions: No     Mobility  Bed Mobility Overal bed mobility: Needs Assistance Bed Mobility: Supine to Sit, Sit to Supine     Supine to sit: Min guard Sit to supine: Min guard, Min assist   General bed mobility comments: increased time, use of bed rail to upright trunk and scoot out to EOB    Transfers Overall transfer level: Needs assistance Equipment used: Rolling walker (2 wheels) Transfers: Sit to/from Stand Sit to Stand: Min assist           General transfer comment: steadying assist, slow movement, pt verbalizes fear of falling, hip extension last, heavy use of hands.  Also assisted with a toilet transfer.  Rest breaks between.    Ambulation/Gait Ambulation/Gait assistance: Min guard, Min assist Gait Distance (Feet): 24 Feet Assistive device: Rolling walker (2 wheels) Gait Pattern/deviations: Step-to pattern, Decreased stride length Gait velocity: decreased     General Gait Details: limited distance to and from bathroom due to fatugu and back pain.  Unsteady gait with decreased stance time L LE.  Poor forward flexed posture with excessive lean on walker.  C/O B knee weakness.   Stairs             Wheelchair Mobility    Modified Rankin (Stroke Patients Only)       Balance                                            Cognition Arousal/Alertness: Awake/alert Behavior During Therapy: WFL for tasks assessed/performed Overall  Cognitive Status: Within Functional Limits for tasks assessed                                 General Comments: AxO x 3 sweet Lady and very willing but limited by fatigue/pain        Exercises      General Comments        Pertinent Vitals/Pain Pain Assessment Pain Assessment: Faces Faces Pain Scale: Hurts little more Pain Location: back Pain Descriptors / Indicators: Aching, Sore, Grimacing Pain Intervention(s): Monitored during session,  Repositioned    Home Living                          Prior Function            PT Goals (current goals can now be found in the care plan section) Progress towards PT goals: Progressing toward goals    Frequency    Min 3X/week      PT Plan Current plan remains appropriate    Co-evaluation              AM-PAC PT "6 Clicks" Mobility   Outcome Measure  Help needed turning from your back to your side while in a flat bed without using bedrails?: A Little Help needed moving from lying on your back to sitting on the side of a flat bed without using bedrails?: A Little Help needed moving to and from a bed to a chair (including a wheelchair)?: A Little Help needed standing up from a chair using your arms (e.g., wheelchair or bedside chair)?: A Little Help needed to walk in hospital room?: A Lot Help needed climbing 3-5 steps with a railing? : A Lot 6 Click Score: 16    End of Session Equipment Utilized During Treatment: Gait belt Activity Tolerance: Patient limited by fatigue;No increased pain Patient left: in bed;with call bell/phone within reach;with bed alarm set Nurse Communication: Mobility status PT Visit Diagnosis: Other abnormalities of gait and mobility (R26.89);Muscle weakness (generalized) (M62.81);Difficulty in walking, not elsewhere classified (R26.2)     Time: 1034-1050 PT Time Calculation (min) (ACUTE ONLY): 16 min  Charges:  $Gait Training: 8-22 mins                     Rica Koyanagi  PTA [facility] [place] M-F          [phone] Weekend pager [phone]

## 2022-06-22 ENCOUNTER — Inpatient Hospital Stay (HOSPITAL_COMMUNITY): Payer: BC Managed Care – PPO

## 2022-06-22 DIAGNOSIS — N179 Acute kidney failure, unspecified: Secondary | ICD-10-CM | POA: Diagnosis not present

## 2022-06-22 DIAGNOSIS — R410 Disorientation, unspecified: Secondary | ICD-10-CM | POA: Diagnosis not present

## 2022-06-22 DIAGNOSIS — M5441 Lumbago with sciatica, right side: Secondary | ICD-10-CM | POA: Diagnosis not present

## 2022-06-22 DIAGNOSIS — I10 Essential (primary) hypertension: Secondary | ICD-10-CM | POA: Diagnosis not present

## 2022-06-22 DIAGNOSIS — C9000 Multiple myeloma not having achieved remission: Secondary | ICD-10-CM | POA: Diagnosis not present

## 2022-06-22 DIAGNOSIS — N289 Disorder of kidney and ureter, unspecified: Secondary | ICD-10-CM | POA: Diagnosis not present

## 2022-06-22 LAB — COMPREHENSIVE METABOLIC PANEL
ALT: 45 U/L — ABNORMAL HIGH (ref 0–44)
AST: 54 U/L — ABNORMAL HIGH (ref 15–41)
Albumin: 3.3 g/dL — ABNORMAL LOW (ref 3.5–5.0)
Alkaline Phosphatase: 73 U/L (ref 38–126)
Anion gap: 5 (ref 5–15)
BUN: 28 mg/dL — ABNORMAL HIGH (ref 8–23)
CO2: 21 mmol/L — ABNORMAL LOW (ref 22–32)
Calcium: 8.2 mg/dL — ABNORMAL LOW (ref 8.9–10.3)
Chloride: 115 mmol/L — ABNORMAL HIGH (ref 98–111)
Creatinine, Ser: 2.53 mg/dL — ABNORMAL HIGH (ref 0.44–1.00)
GFR, Estimated: 21 mL/min — ABNORMAL LOW (ref >60)
Glucose, Bld: 127 mg/dL — ABNORMAL HIGH (ref 70–99)
Potassium: 3.6 mmol/L (ref 3.5–5.1)
Sodium: 141 mmol/L (ref 135–145)
Total Bilirubin: 0.6 mg/dL (ref 0.3–1.2)
Total Protein: 6.6 g/dL (ref 6.5–8.1)

## 2022-06-22 LAB — CBC WITH DIFFERENTIAL/PLATELET
Abs Immature Granulocytes: 0.13 10*3/uL — ABNORMAL HIGH (ref 0.00–0.07)
Basophils Absolute: 0 10*3/uL (ref 0.0–0.1)
Basophils Relative: 0 %
Eosinophils Absolute: 0 10*3/uL (ref 0.0–0.5)
Eosinophils Relative: 0 %
HCT: 30.8 % — ABNORMAL LOW (ref 36.0–46.0)
Hemoglobin: 9.9 g/dL — ABNORMAL LOW (ref 12.0–15.0)
Immature Granulocytes: 2 %
Lymphocytes Relative: 4 %
Lymphs Abs: 0.3 10*3/uL — ABNORMAL LOW (ref 0.7–4.0)
MCH: 27.7 pg (ref 26.0–34.0)
MCHC: 32.1 g/dL (ref 30.0–36.0)
MCV: 86 fL (ref 80.0–100.0)
Monocytes Absolute: 1.5 10*3/uL — ABNORMAL HIGH (ref 0.1–1.0)
Monocytes Relative: 17 %
Neutro Abs: 6.7 10*3/uL (ref 1.7–7.7)
Neutrophils Relative %: 77 %
Platelets: 146 10*3/uL — ABNORMAL LOW (ref 150–400)
RBC: 3.58 MIL/uL — ABNORMAL LOW (ref 3.87–5.11)
RDW: 18.1 % — ABNORMAL HIGH (ref 11.5–15.5)
WBC: 8.6 10*3/uL (ref 4.0–10.5)
nRBC: 0 % (ref 0.0–0.2)

## 2022-06-22 LAB — URINALYSIS, COMPLETE (UACMP) WITH MICROSCOPIC
Bilirubin Urine: NEGATIVE
Glucose, UA: >=500 mg/dL — AB
Ketones, ur: 5 mg/dL — AB
Leukocytes,Ua: NEGATIVE
Nitrite: NEGATIVE
Protein, ur: 100 mg/dL — AB
Specific Gravity, Urine: 1.017 (ref 1.005–1.030)
WBC, UA: >50 WBC/hpf — ABNORMAL HIGH (ref 0–5)
pH: 5 (ref 5.0–8.0)

## 2022-06-22 LAB — TROPONIN I (HIGH SENSITIVITY): Troponin I (High Sensitivity): 18 ng/L — ABNORMAL HIGH (ref <18)

## 2022-06-22 MED ORDER — MORPHINE SULFATE (PF) 2 MG/ML IV SOLN
1.0000 mg | Freq: Once | INTRAVENOUS | Status: AC
  Administered 2022-06-22: 1 mg via INTRAVENOUS
  Filled 2022-06-22: qty 1

## 2022-06-22 MED ORDER — OXYCODONE HCL 5 MG PO TABS
5.0000 mg | ORAL_TABLET | Freq: Four times a day (QID) | ORAL | Status: DC | PRN
  Administered 2022-06-22 – 2022-06-26 (×13): 5 mg via ORAL
  Filled 2022-06-22 (×14): qty 1

## 2022-06-22 MED ORDER — MORPHINE SULFATE 2 MG/ML IJ SOLN: 2.0000 mg | INTRAMUSCULAR | Status: DC | PRN

## 2022-06-22 NOTE — Progress Notes (Signed)
[facility]  PROGRESS NOTE  Michelle [name] CNO:[id number] DOB: [birth year] DOA: 06/14/2022 PCP: Leeroy Cha, MD   Brief HPI:   61 year old with past medical history significant for bony metastasis likely secondary to multiple myeloma, pathology pending, hypertension, hypothyroidism, hyperlipidemia who presented with worsening intractable back pain.  She has had worsening back pain due to her metastatic disease.  MRI 8/3 showed worsening widespread tumor infiltrate with new extraosseous tumor in the ventral epidural space. Patient started on radiation therapy on 8/10 Also found to have moderate right pleural effusion which appeared to be loculated She underwent Port-A-Cath placement and thoracentesis on 06/17/2022   Subjective   Patient seen and examined, developed chest pain today, EKG was unremarkable.  Chest pain resolved.  Troponin x2 ordered.   Assessment/Plan:     Intractable pain/multiple myeloma -Widespread tumor infiltration with new extraosseous tumor in the ventral epidural space as per MRI from 8/3 -Pathology from bone marrow biopsy showed hypercellular bone marrow with plasma cell neoplasm -Free kappa light chain 1.153, M spike 1.8 -Dr. Marin Olp following -Started radiation treatment on 8/10 under care of Dr. Tammi Klippel -Continue Decadron 20 mg IV daily -Fentanyl patch increased from 12.5 mcg to 25 mcg to 72 hours -Continue oral hydromorphone as needed every 6 hours -Underwent Port-A-Cath placement on 06/17/2022 -Plan for Velcade/Cytoxan today Patient also getting radiation treatment --Discharge home in a.m.  Chest pain -Resolved -EKG was unremarkable -Patient was given 1 dose of morphine 1 mg IV x1 -Troponin x2 ordered  Moderate right pleural effusion -Underwent thoracentesis on 06/17/2022 yielding 1.2 L of exudative fluid - Started  IV Ceftriaxone and Zithromax, Protein ration 0.6, LDH ratio 1.6, WBC 16,000 - Follow pleural fluid culture: no growth ,  Cytology is consistent with plasma cell dyscrasia, multiple myeloma Repeated chest x ray 8/15: Near complete resolution of right effusion, no pneumothorax. -Pulmonology recommends to discontinue azithromycin today -And switch IV ceftriaxone to Omnicef 10 mg p.o. twice daily for 5 days  Sinus tachycardia -Unclear etiology, VQ scan was negative for PE -Started on low-dose metoprolol 25 mg p.o. twice daily  Acute kidney injury in setting of multiple myeloma -Patient presented with creatinine of 2.0, baseline creatinine 1.0 -In setting of multiple myeloma -Creatinine is stable at 2.53 -Sodium improved to 141   Hypokalemia -Potassium is 3.4 this morning -We will replace potassium and follow BMP in am  Hyperlipidemia -Crestor 10 mg daily  Hypothyroidism -Continue Synthroid  Hypercalcemia -Presented with calcium at 13.8. -Secondary to multiple myeloma  -Received IV Zometa, calcitonin - Ca down to 8.2  Hypertension -Continue to hold lisinopril HCTZ due to acute kidney injury and hypercalcemia -Continue metoprolol 25 mg p.o. twice daily as above  Anemia of chronic disease -Anemia secondary to multiple myeloma -Patient received 2 units PRBC on 06/17/2022 -Hemoglobin is stable at 10.4  Medications     Chlorhexidine Gluconate Cloth  6 each Topical Daily   cholecalciferol  1,000 Units Oral Daily   enoxaparin (LOVENOX) injection  30 mg Subcutaneous Q24H   famciclovir  500 mg Oral Daily   fluconazole  100 mg Oral Daily   levothyroxine  88 mcg Oral Q0600   metoprolol tartrate  25 mg Oral BID   multivitamin with minerals  1 tablet Oral Daily   pantoprazole  40 mg Oral BID   protein supplement  1 Scoop Oral TID WC   rosuvastatin  10 mg Oral Daily     Data Reviewed:   CBG:  Recent Labs  Lab 06/17/22  1629 06/21/22 1609  GLUCAP 138* 139*    SpO2: 98 % O2 Flow Rate (L/min): 2 L/min    Vitals:   06/22/22 1200 06/22/22 1300 06/22/22 1400 06/22/22 1441  BP:    (!)  146/86  Pulse:      Resp: 16 20 (!) 24 (!) 22  Temp:    97.7 F (36.5 C)  TempSrc:    Oral  SpO2:    98%  Weight:      Height:          Data Reviewed:  Basic Metabolic Panel: Recent Labs  Lab 06/19/22 0330 06/20/22 0331 06/21/22 0557 06/21/22 1500 06/22/22 0447  NA 143 144 146* 144 141  K 3.4* 2.9* 3.4* 3.6 3.6  CL 114* 115* 118* 118* 115*  CO2 22 22 21* 18* 21*  GLUCOSE 104* 92 94 164* 127*  BUN 39* 35* 27* 26* 28*  CREATININE 2.13* 2.41* 2.63* 2.68* 2.53*  CALCIUM 8.2* 8.6* 8.8* 8.4* 8.2*    CBC: Recent Labs  Lab 06/18/22 0134 06/19/22 0330 06/20/22 0331 06/21/22 0557 06/22/22 0447  WBC 12.5* 8.3 7.8 7.7 8.6  NEUTROABS 8.8* 5.9 5.6 5.5 6.7  HGB 10.8* 9.5* 10.2* 10.4* 9.9*  HCT 32.4* 28.9* 31.9* 32.8* 30.8*  MCV 82.7 83.8 86.0 86.3 86.0  PLT 176 148* 160 157 146*    LFT Recent Labs  Lab 06/18/22 0134 06/19/22 0330 06/20/22 0331 06/21/22 0557 06/22/22 0447  AST _0 40 54*  ALT _1 32 45*  ALKPHOS 77  75 62 73 80 73  BILITOT 0.7  0.7 0.4 0.7 0.8 0.6  PROT 7.2  7.1 6.0* 6.4* 6.7 6.6  ALBUMIN 3.7  3.6 3.0* 3.3* 3.5 3.3*     Antibiotics: Anti-infectives (From admission, onward)    Start     Dose/Rate Route Frequency Ordered Stop   06/19/22 1000  azithromycin (ZITHROMAX) tablet 500 mg        500 mg Oral  Once 06/19/22 0843 06/19/22 1058   06/19/22 1000  cefdinir (OMNICEF) capsule 300 mg  Status:  Discontinued        300 mg Oral Every 12 hours 06/19/22 0843 06/20/22 0723   06/17/22 2000  azithromycin (ZITHROMAX) 500 mg in sodium chloride 0.9 % 250 mL IVPB  Status:  Discontinued        500 mg 250 mL/hr over 60 Minutes Intravenous Every 24 hours 06/17/22 1741 06/19/22 0843   06/17/22 1830  cefTRIAXone (ROCEPHIN) 2 g in sodium chloride 0.9 % 100 mL IVPB  Status:  Discontinued        2 g 200 mL/hr over 30 Minutes Intravenous Every 24 hours 06/17/22 1740 06/19/22 0843   06/15/22 1000  fluconazole (DIFLUCAN) tablet 100 mg         100 mg Oral Daily 06/14/22 2315     06/15/22 1000  famciclovir (FAMVIR) tablet 500 mg        500 mg Oral Daily 06/15/22 0703          DVT prophylaxis: Lovenox  Code Status: Full code  Family Communication: Discussed with daughter at bedside   CONSULTS oncology   Objective    Physical Examination:  General-appears in no acute distress Heart-S1-S2, regular, no murmur auscultated Lungs-clear to auscultation bilaterally, no wheezing or crackles auscultated Abdomen-soft, nontender, no organomegaly Extremities-no edema in the lower extremities Neuro-alert, oriented x3, no focal deficit noted   Status is: Inpatient:  Oswald Hillock   [facility] If 7PM-7AM, please contact night-coverage at [email or web address], Office  [phone]   06/22/2022, 5:30 PM  LOS: 8 days

## 2022-06-22 NOTE — Progress Notes (Signed)
She got her chemotherapy yesterday.  She seemed to do well with this.  However, she clearly is more disoriented this morning.  She really was not sure of what the date was.  She cannot tell me what she ate yesterday.  She does seem as if she was in a "fog."  I have to worry that this is all from medications.  I suspect she may have some sensitivity to her pain medications.  We will stop the fentanyl patch.  I will stop the Marinol.  Most of the hydromorphone.  We will have to be careful with her pain medication.  She is going to the bathroom quite a bit.  I will know if she has a urinary tract infection.  I am unsure if there is any issues with diarrhea.  She had radiation yesterday.  I guess she did okay with the radiation.  There is no obvious dyspnea.  She is having no problems with cough.  There is no rashes.  She has had no headache.  Her labs show sodium 141.  Potassium 3.6.  BUN 28 creatinine one 2.53.  Calcium is 8.2 with an albumin of 3.3.  LFTs are slightly elevated.  Her white cell count is 8.6.  Hemoglobin 9.9.  Platelet count 146.  Her vital signs show temperature 97.5.  Pulse 103.  Blood pressure 149/82.  Oxygen saturation on room air is 99%.  Her lungs sound relatively clear.  Oral exam does not show any thrush.  Cardiac exam slightly tachycardic but regular.  There are no murmurs.  Abdomen is soft.  Bowel sounds are present.  There is no guarding or rebound tenderness.  Extremity shows some trace edema in her legs.  She moves all extremities okay.  Neurological exam shows no focal neurological deficits.  She clearly has some cognitive deficits.  Again, I think the problem right now is her mental status.  I do still think that she is capable of going home.  I think the pain medications need to be adjusted.  I would probably hold on her pain medications right now.  I would probably check a urinalysis on her.  I probably do a chest x-ray.  Again, I just do not think that she is  mentally capable of managing at home.  I know that everybody is doing a great job with her.  Hopefully, we can get her mentation back to a more clear cognitive state.  [name], MD  Psalm 41:3

## 2022-06-23 DIAGNOSIS — N179 Acute kidney failure, unspecified: Secondary | ICD-10-CM | POA: Diagnosis not present

## 2022-06-23 DIAGNOSIS — M5441 Lumbago with sciatica, right side: Secondary | ICD-10-CM | POA: Diagnosis not present

## 2022-06-23 DIAGNOSIS — I10 Essential (primary) hypertension: Secondary | ICD-10-CM | POA: Diagnosis not present

## 2022-06-23 LAB — COMPREHENSIVE METABOLIC PANEL
ALT: 40 U/L (ref 0–44)
AST: 45 U/L — ABNORMAL HIGH (ref 15–41)
Albumin: 3 g/dL — ABNORMAL LOW (ref 3.5–5.0)
Alkaline Phosphatase: 69 U/L (ref 38–126)
Anion gap: 1 — ABNORMAL LOW (ref 5–15)
BUN: 24 mg/dL — ABNORMAL HIGH (ref 8–23)
CO2: 21 mmol/L — ABNORMAL LOW (ref 22–32)
Calcium: 7.8 mg/dL — ABNORMAL LOW (ref 8.9–10.3)
Chloride: 119 mmol/L — ABNORMAL HIGH (ref 98–111)
Creatinine, Ser: 2.59 mg/dL — ABNORMAL HIGH (ref 0.44–1.00)
GFR, Estimated: 20 mL/min — ABNORMAL LOW (ref >60)
Glucose, Bld: 109 mg/dL — ABNORMAL HIGH (ref 70–99)
Potassium: 2.9 mmol/L — ABNORMAL LOW (ref 3.5–5.1)
Sodium: 141 mmol/L (ref 135–145)
Total Bilirubin: 0.3 mg/dL (ref 0.3–1.2)
Total Protein: 5.7 g/dL — ABNORMAL LOW (ref 6.5–8.1)

## 2022-06-23 LAB — CBC WITH DIFFERENTIAL/PLATELET
Abs Immature Granulocytes: 0.06 10*3/uL (ref 0.00–0.07)
Basophils Absolute: 0 10*3/uL (ref 0.0–0.1)
Basophils Relative: 0 %
Eosinophils Absolute: 0.1 10*3/uL (ref 0.0–0.5)
Eosinophils Relative: 2 %
HCT: 27.7 % — ABNORMAL LOW (ref 36.0–46.0)
Hemoglobin: 8.8 g/dL — ABNORMAL LOW (ref 12.0–15.0)
Immature Granulocytes: 1 %
Lymphocytes Relative: 5 %
Lymphs Abs: 0.2 10*3/uL — ABNORMAL LOW (ref 0.7–4.0)
MCH: 27.3 pg (ref 26.0–34.0)
MCHC: 31.8 g/dL (ref 30.0–36.0)
MCV: 86 fL (ref 80.0–100.0)
Monocytes Absolute: 0.8 10*3/uL (ref 0.1–1.0)
Monocytes Relative: 17 %
Neutro Abs: 3.6 10*3/uL (ref 1.7–7.7)
Neutrophils Relative %: 75 %
Platelets: 122 10*3/uL — ABNORMAL LOW (ref 150–400)
RBC: 3.22 MIL/uL — ABNORMAL LOW (ref 3.87–5.11)
RDW: 18.2 % — ABNORMAL HIGH (ref 11.5–15.5)
WBC: 4.8 10*3/uL (ref 4.0–10.5)
nRBC: 0 % (ref 0.0–0.2)

## 2022-06-23 LAB — URINE CULTURE: Culture: NO GROWTH

## 2022-06-23 LAB — TROPONIN I (HIGH SENSITIVITY): Troponin I (High Sensitivity): 21 ng/L — ABNORMAL HIGH (ref <18)

## 2022-06-23 MED ORDER — POTASSIUM CHLORIDE 10 MEQ/100ML IV SOLN
10.0000 meq | Freq: Once | INTRAVENOUS | Status: AC
  Administered 2022-06-23: 10 meq via INTRAVENOUS

## 2022-06-23 MED ORDER — HYDROMORPHONE HCL 1 MG/ML IJ SOLN
1.0000 mg | Freq: Once | INTRAMUSCULAR | Status: AC
  Administered 2022-06-23: 1 mg via INTRAVENOUS
  Filled 2022-06-23: qty 1

## 2022-06-23 MED ORDER — POTASSIUM CHLORIDE 10 MEQ/100ML IV SOLN
10.0000 meq | INTRAVENOUS | Status: AC
  Administered 2022-06-23 (×2): 10 meq via INTRAVENOUS
  Filled 2022-06-23 (×3): qty 100

## 2022-06-23 NOTE — TOC Progression Note (Signed)
Transition of Care [facility]) - Progression Note    Patient Details  Name: Michelle [name] MRN: [id number] Date of Birth: 13-May-1961  Transition of Care [facility] CM/SW Contact  Ross Ludwig, [place] Phone Number: 06/23/2022, 7:36 PM  Clinical Narrative:     CSW saw PT is recommending [facility].  CSW to follow up with patient on Monday about possible [place] agencies.  Expected Discharge Plan: Home/Self Care Barriers to Discharge: Continued Medical Work up  Expected Discharge Plan and Services Expected Discharge Plan: Home/Self Care   Discharge Planning Services: CM Consult   Living arrangements for the past 2 months: Apartment                                       Social Determinants of Health (SDOH) Interventions    Readmission Risk Interventions     No data to display

## 2022-06-23 NOTE — Progress Notes (Signed)
[facility]  PROGRESS NOTE  Michelle [name] MHW:[id number] DOB: Apr 30, 1961 DOA: 06/14/2022 PCP: Leeroy Cha, MD   Brief HPI:   61 year old with past medical history significant for bony metastasis likely secondary to multiple myeloma, pathology pending, hypertension, hypothyroidism, hyperlipidemia who presented with worsening intractable back pain.  She has had worsening back pain due to her metastatic disease.  MRI 8/3 showed worsening widespread tumor infiltrate with new extraosseous tumor in the ventral epidural space. Patient started on radiation therapy on 8/10 Also found to have moderate right pleural effusion which appeared to be loculated She underwent Port-A-Cath placement and thoracentesis on 06/17/2022   Subjective   Patient denies chest pain this morning.  EKG was unremarkable yesterday, troponin 18, 21.   Assessment/Plan:    Intractable pain/multiple myeloma -Widespread tumor infiltration with new extraosseous tumor in the ventral epidural space as per MRI from 8/3 -Pathology from bone marrow biopsy showed hypercellular bone marrow with plasma cell neoplasm -Free kappa light chain 1.153, M spike 1.8 -Dr. Marin Olp following -Started radiation treatment on 8/10 under care of Dr. Tammi Klippel -Continue Decadron 20 mg IV daily -Fentanyl patch increased from 12.5 mcg to 25 mcg to 72 hours -Continue oral hydromorphone as needed every 6 hours -Underwent Port-A-Cath placement on 06/17/2022 -Plan for Velcade/Cytoxan today Patient also getting radiation treatment  Chest pain -Resolved -EKG was unremarkable -Patient was given 1 dose of morphine 1 mg IV x1 -Troponin x2 were 18 and 21 respectively  Moderate right pleural effusion -Underwent thoracentesis on 06/17/2022 yielding 1.2 L of exudative fluid - Started  IV Ceftriaxone and Zithromax, Protein ration 0.6, LDH ratio 1.6, WBC 16,000 - Follow pleural fluid culture: no growth , Cytology is consistent with plasma cell  dyscrasia, multiple myeloma Repeated chest x ray 8/15: Near complete resolution of right effusion, no pneumothorax. -Pulmonology recommends to change antibiotic from Zithromax to [facility] -Completed antibiotics course in the hospital  Sinus tachycardia -Unclear etiology, VQ scan was negative for PE -Started on low-dose metoprolol 25 mg p.o. twice daily  Acute kidney injury in setting of multiple myeloma -Patient presented with creatinine of 2.0, baseline creatinine 1.0 -In setting of multiple myeloma -Creatinine is stable at 2.53 -Sodium improved to 141   Hypokalemia -Potassium is 2.9 -We will replace potassium and follow BMP in am  Hyperlipidemia -Crestor 10 mg daily  Hypothyroidism -Continue Synthroid  Hypercalcemia -Presented with calcium at 13.8. -Secondary to multiple myeloma  -Received IV Zometa, calcitonin - Ca down to 8.2  Hypertension -Continue to hold lisinopril HCTZ due to acute kidney injury and hypercalcemia -Continue metoprolol 25 mg p.o. twice daily as above  Anemia of chronic disease -Anemia secondary to multiple myeloma -Patient received 2 units PRBC on 06/17/2022 -Hemoglobin is stable at 10.4  Medications     Chlorhexidine Gluconate Cloth  6 each Topical Daily   cholecalciferol  1,000 Units Oral Daily   enoxaparin (LOVENOX) injection  30 mg Subcutaneous Q24H   famciclovir  500 mg Oral Daily   fluconazole  100 mg Oral Daily   levothyroxine  88 mcg Oral Q0600   metoprolol tartrate  25 mg Oral BID   multivitamin with minerals  1 tablet Oral Daily   pantoprazole  40 mg Oral BID   protein supplement  1 Scoop Oral TID WC   rosuvastatin  10 mg Oral Daily     Data Reviewed:   CBG:  Recent Labs  Lab 06/17/22 1629 06/21/22 1609  GLUCAP 138* 139*    SpO2: 98 %  O2 Flow Rate (L/min): 2 L/min    Vitals:   06/23/22 0200 06/23/22 0615 06/23/22 0908 06/23/22 1300  BP: (!) 146/75 (!) 144/88 (!) 144/88 (!) 144/79  Pulse: 93 (!) 104 (!) 104 92   Resp: _0 Temp: 98.1 F (36.7 C) 98.2 F (36.8 C)  98.2 F (36.8 C)  TempSrc: Oral Oral  Oral  SpO2: 96% 98%  98%  Weight:      Height:          Data Reviewed:  Basic Metabolic Panel: Recent Labs  Lab 06/20/22 0331 06/21/22 0557 06/21/22 1500 06/22/22 0447 06/23/22 0615  NA 144 146* 144 141 141  K 2.9* 3.4* 3.6 3.6 2.9*  CL 115* 118* 118* 115* 119*  CO2 22 21* 18* 21* 21*  GLUCOSE 92 94 164* 127* 109*  BUN 35* 27* 26* 28* 24*  CREATININE 2.41* 2.63* 2.68* 2.53* 2.59*  CALCIUM 8.6* 8.8* 8.4* 8.2* 7.8*    CBC: Recent Labs  Lab 06/19/22 0330 06/20/22 0331 06/21/22 0557 06/22/22 0447 06/23/22 0615  WBC 8.3 7.8 7.7 8.6 4.8  NEUTROABS 5.9 5.6 5.5 6.7 3.6  HGB 9.5* 10.2* 10.4* 9.9* 8.8*  HCT 28.9* 31.9* 32.8* 30.8* 27.7*  MCV 83.8 86.0 86.3 86.0 86.0  PLT 148* 160 157 146* 122*    LFT Recent Labs  Lab 06/19/22 0330 06/20/22 0331 06/21/22 0557 06/22/22 0447 06/23/22 0615  AST 29 29 40 54* 45*  ALT 26 28 32 45* 40  ALKPHOS 62 73 80 73 69  BILITOT 0.4 0.7 0.8 0.6 0.3  PROT 6.0* 6.4* 6.7 6.6 5.7*  ALBUMIN 3.0* 3.3* 3.5 3.3* 3.0*     Antibiotics: Anti-infectives (From admission, onward)    Start     Dose/Rate Route Frequency Ordered Stop   06/19/22 1000  azithromycin (ZITHROMAX) tablet 500 mg        500 mg Oral  Once 06/19/22 0843 06/19/22 1058   06/19/22 1000  cefdinir (OMNICEF) capsule 300 mg  Status:  Discontinued        300 mg Oral Every 12 hours 06/19/22 0843 06/20/22 0723   06/17/22 2000  azithromycin (ZITHROMAX) 500 mg in sodium chloride 0.9 % 250 mL IVPB  Status:  Discontinued        500 mg 250 mL/hr over 60 Minutes Intravenous Every 24 hours 06/17/22 1741 06/19/22 0843   06/17/22 1830  cefTRIAXone (ROCEPHIN) 2 g in sodium chloride 0.9 % 100 mL IVPB  Status:  Discontinued        2 g 200 mL/hr over 30 Minutes Intravenous Every 24 hours 06/17/22 1740 06/19/22 0843   06/15/22 1000  fluconazole (DIFLUCAN) tablet 100 mg        100 mg  Oral Daily 06/14/22 2315     06/15/22 1000  famciclovir (FAMVIR) tablet 500 mg        500 mg Oral Daily 06/15/22 0703          DVT prophylaxis: Lovenox  Code Status: Full code  Family Communication: Discussed with daughter at bedside   CONSULTS oncology   Objective    Physical Examination:  General-appears in no acute distress Heart-S1-S2, regular, no murmur auscultated Lungs-clear to auscultation bilaterally, no wheezing or crackles auscultated Abdomen-soft, nontender, no organomegaly Extremities-no edema in the lower extremities Neuro-alert, oriented x3, no focal deficit noted   Status is: Inpatient:             Oswald Hillock   [facility] If 7PM-7AM, please  contact night-coverage at [email or web address], Office  [phone]   06/23/2022, 2:41 PM  LOS: 9 days

## 2022-06-23 NOTE — Progress Notes (Signed)
Pulmonary will sign off  Call as needed

## 2022-06-24 ENCOUNTER — Ambulatory Visit
Admission: RE | Admit: 2022-06-24 | Discharge: 2022-06-24 | Disposition: A | Discharge status: Home / Self Care | Payer: BC Managed Care – PPO | Source: Ambulatory Visit | Attending: Radiation Oncology | Admitting: Radiation Oncology

## 2022-06-24 ENCOUNTER — Other Ambulatory Visit: Payer: Self-pay

## 2022-06-24 DIAGNOSIS — N179 Acute kidney failure, unspecified: Secondary | ICD-10-CM | POA: Diagnosis not present

## 2022-06-24 DIAGNOSIS — I10 Essential (primary) hypertension: Secondary | ICD-10-CM | POA: Diagnosis not present

## 2022-06-24 DIAGNOSIS — M5441 Lumbago with sciatica, right side: Secondary | ICD-10-CM | POA: Diagnosis not present

## 2022-06-24 LAB — RAD ONC ARIA SESSION SUMMARY
Course Elapsed Days: 11
Plan Fractions Treated to Date: 8
Plan Prescribed Dose Per Fraction: 2 Gy
Plan Total Fractions Prescribed: 10
Plan Total Prescribed Dose: 20 Gy
Reference Point Dosage Given to Date: 16 Gy
Reference Point Session Dosage Given: 2 Gy
Session Number: 8

## 2022-06-24 LAB — UPEP/UIFE/LIGHT CHAINS/TP, 24-HR UR
% BETA, Urine: 53.1 %
ALPHA 1 URINE: 1.4 %
Albumin, U: 29.8 %
Alpha 2, Urine: 10 %
Free Kappa Lt Chains,Ur: 1848.43 mg/L — ABNORMAL HIGH (ref 1.17–86.46)
Free Kappa/Lambda Ratio: 137.53 — ABNORMAL HIGH (ref 1.83–14.26)
Free Lambda Lt Chains,Ur: 13.44 mg/L (ref 0.27–15.21)
GAMMA GLOBULIN URINE: 5.7 %
M-SPIKE %, Urine: 42.1 % — ABNORMAL HIGH
M-Spike, Mg/24 Hr: 406 mg/24 hr — ABNORMAL HIGH
Total Protein, Urine-Ur/day: 964 mg/24 hr — ABNORMAL HIGH (ref 30–150)
Total Protein, Urine: 91.8 mg/dL
Total Volume: 1050

## 2022-06-24 LAB — CBC WITH DIFFERENTIAL/PLATELET
Abs Immature Granulocytes: 0.05 10*3/uL (ref 0.00–0.07)
Basophils Absolute: 0 10*3/uL (ref 0.0–0.1)
Basophils Relative: 0 %
Eosinophils Absolute: 0.1 10*3/uL (ref 0.0–0.5)
Eosinophils Relative: 3 %
HCT: 28.2 % — ABNORMAL LOW (ref 36.0–46.0)
Hemoglobin: 9 g/dL — ABNORMAL LOW (ref 12.0–15.0)
Immature Granulocytes: 1 %
Lymphocytes Relative: 15 %
Lymphs Abs: 0.7 10*3/uL (ref 0.7–4.0)
MCH: 27.4 pg (ref 26.0–34.0)
MCHC: 31.9 g/dL (ref 30.0–36.0)
MCV: 86 fL (ref 80.0–100.0)
Monocytes Absolute: 0.7 10*3/uL (ref 0.1–1.0)
Monocytes Relative: 17 %
Neutro Abs: 2.7 10*3/uL (ref 1.7–7.7)
Neutrophils Relative %: 64 %
Platelets: 119 10*3/uL — ABNORMAL LOW (ref 150–400)
RBC: 3.28 MIL/uL — ABNORMAL LOW (ref 3.87–5.11)
RDW: 18.2 % — ABNORMAL HIGH (ref 11.5–15.5)
WBC: 4.3 10*3/uL (ref 4.0–10.5)
nRBC: 0 % (ref 0.0–0.2)

## 2022-06-24 LAB — COMPREHENSIVE METABOLIC PANEL
ALT: 41 U/L (ref 0–44)
AST: 45 U/L — ABNORMAL HIGH (ref 15–41)
Albumin: 3 g/dL — ABNORMAL LOW (ref 3.5–5.0)
Alkaline Phosphatase: 73 U/L (ref 38–126)
Anion gap: 5 (ref 5–15)
BUN: 17 mg/dL (ref 8–23)
CO2: 19 mmol/L — ABNORMAL LOW (ref 22–32)
Calcium: 8 mg/dL — ABNORMAL LOW (ref 8.9–10.3)
Chloride: 119 mmol/L — ABNORMAL HIGH (ref 98–111)
Creatinine, Ser: 2.65 mg/dL — ABNORMAL HIGH (ref 0.44–1.00)
GFR, Estimated: 20 mL/min — ABNORMAL LOW (ref >60)
Glucose, Bld: 110 mg/dL — ABNORMAL HIGH (ref 70–99)
Potassium: 3.1 mmol/L — ABNORMAL LOW (ref 3.5–5.1)
Sodium: 143 mmol/L (ref 135–145)
Total Bilirubin: 0.5 mg/dL (ref 0.3–1.2)
Total Protein: 5.8 g/dL — ABNORMAL LOW (ref 6.5–8.1)

## 2022-06-24 MED ORDER — LIDOCAINE 5 % EX PTCH
2.0000 | MEDICATED_PATCH | CUTANEOUS | Status: DC
  Administered 2022-06-24 – 2022-06-25 (×2): 2 via TRANSDERMAL
  Filled 2022-06-24 (×3): qty 2

## 2022-06-24 MED ORDER — POTASSIUM CHLORIDE 10 MEQ/100ML IV SOLN
10.0000 meq | INTRAVENOUS | Status: AC
  Administered 2022-06-24 (×5): 10 meq via INTRAVENOUS
  Filled 2022-06-24 (×3): qty 100

## 2022-06-24 NOTE — Progress Notes (Signed)
Michelle [name] seems to be doing better mentally.  She seems to be more oriented.  She seems to be able to have better thinking.  I am sure that her pain medications probably were the cause of the cognitive decline.  She is not any pain from what she says.  She has not been walking.  She says she is not having any diarrhea.  I am not sure how much she really is eating.  Her labs show potassium 3.1.  Her BUN is 17 creatinine 2.65.  Calcium is 8 with an albumin of 3.0.  Her white cell count 4.3.  Hemoglobin 9.  Her platelet count is 119,000.  She still needs some extra potassium.  She has had no nausea or vomiting.  She has had no cough or shortness of breath.  Her vital signs show temperature of 98.6.  Pulse 98.  Blood pressure 140/80.  Her oxygen saturations 100%.  Her lungs sound clear bilaterally.  She has good air movement bilaterally.  Cardiac exam regular rate and rhythm.  She has no murmurs, rubs or bruits.  Oral exam does not show any mucositis.  Abdomen is soft.  There is no guarding or rebound tenderness.  She has decent bowel sounds.  There is no fluid wave.  There is no palpable liver or spleen tip.  Extremity shows no clubbing, cyanosis or edema.  I am glad to see that Michelle [name]'s cognitive state is improving.  Again, this had to be from her medications for pain.  I would suspect this might have been from the fentanyl patch.  I think we should see how she does today with more activity.  We really need to get her ambulating.  She will continue radiation therapy.  I probably would have her stay inpatient until she completes her radiation which should be on Wednesday.  She needs extra potassium.  I will give her some potassium.  At least there is no hypercalcemia.  Hopefully, she will eat more.  We really need to get her to take in more calories.  I appreciate the incredible care that she is gotten from all the staff up here on [address].  The staff, as always, have incredibly compassionate and  thorough.  [name], MD  Psalm 41:3

## 2022-06-24 NOTE — Progress Notes (Signed)
Physical Therapy Treatment Patient Details Name: Michelle [name] MRN: [id number] DOB: 1961-05-11 Today's Date: 06/24/2022   History of Present Illness Michelle [name] is a 61 y.o. female w/ho presents with worsening intractable back pain. Of note, pt had thoracentesis for R pleural effusion 8/14.  PMH: bony metastasis likely secondary to likely multiple myeloma with pathology pending, HTN, hypothyroidism, HLD    PT Comments    Pt assisted with ambulating to/from bathroom. Pt reports fatigue and declined ambulating farther due to also having to go to radiation soon.    Recommendations for follow up therapy are one component of a multi-disciplinary discharge planning process, led by the attending physician.  Recommendations may be updated based on patient status, additional functional criteria and insurance authorization.  Follow Up Recommendations  Home health PT     Assistance Recommended at Discharge Intermittent Supervision/Assistance  Patient can return home with the following A little help with walking and/or transfers;A little help with bathing/dressing/bathroom;Assistance with cooking/housework;Assist for transportation   Equipment Recommendations  None recommended by PT    Recommendations for Other Services       Precautions / Restrictions Precautions Precautions: Fall Precaution Comments: back pain, METS     Mobility  Bed Mobility Overal bed mobility: Needs Assistance Bed Mobility: Supine to Sit, Sit to Supine     Supine to sit: Min guard Sit to supine: Min guard   General bed mobility comments: increased time, use of bed rail    Transfers Overall transfer level: Needs assistance Equipment used: Rolling walker (2 wheels) Transfers: Sit to/from Stand Sit to Stand: Min guard           General transfer comment: cues for hand placement    Ambulation/Gait Ambulation/Gait assistance: Min guard Gait Distance (Feet): 16 Feet Assistive device: Rolling walker (2  wheels) Gait Pattern/deviations: Decreased stride length, Step-through pattern Gait velocity: decreased     General Gait Details: pt only able to ambulate to/from bathroom, pt reports fatigue from being up to bathroom previously and still has to go to radiation   Stairs             Wheelchair Mobility    Modified Rankin (Stroke Patients Only)       Balance Overall balance assessment: Needs assistance, History of Falls         Standing balance support: During functional activity Standing balance-Leahy Scale: Fair Standing balance comment: pt to stand and perform pericare without UE support                            Cognition Arousal/Alertness: Awake/alert Behavior During Therapy: WFL for tasks assessed/performed Overall Cognitive Status: Within Functional Limits for tasks assessed                                          Exercises      General Comments        Pertinent Vitals/Pain Pain Assessment Pain Assessment: Faces Faces Pain Scale: Hurts little more Pain Location: back Pain Descriptors / Indicators: Aching, Sore, Grimacing Pain Intervention(s): Monitored during session, Repositioned    Home Living                          Prior Function            PT Goals (current goals  can now be found in the care plan section) Progress towards PT goals: Progressing toward goals    Frequency    Min 3X/week      PT Plan Current plan remains appropriate    Co-evaluation              AM-PAC PT "6 Clicks" Mobility   Outcome Measure  Help needed turning from your back to your side while in a flat bed without using bedrails?: A Little Help needed moving from lying on your back to sitting on the side of a flat bed without using bedrails?: A Little Help needed moving to and from a bed to a chair (including a wheelchair)?: A Little Help needed standing up from a chair using your arms (e.g., wheelchair or  bedside chair)?: A Little Help needed to walk in hospital room?: A Little Help needed climbing 3-5 steps with a railing? : A Lot 6 Click Score: 17    End of Session Equipment Utilized During Treatment: Gait belt Activity Tolerance: Patient limited by fatigue Patient left: in bed;with call bell/phone within reach;with family/visitor present   PT Visit Diagnosis: Other abnormalities of gait and mobility (R26.89);Muscle weakness (generalized) (M62.81);Difficulty in walking, not elsewhere classified (R26.2)     Time: 3614-4315 PT Time Calculation (min) (ACUTE ONLY): 18 min  Charges:  $Gait Training: 8-22 mins                     Jannette Spanner PT, DPT Physical Therapist Acute Rehabilitation Services Preferred contact method: Secure Chat Weekend Pager Only: [phone] Office: [place] 06/24/2022, 3:56 PM

## 2022-06-24 NOTE — Progress Notes (Signed)
[facility]  PROGRESS NOTE  Michelle [name] CZY:[id number] DOB: 1961-03-23 DOA: 06/14/2022 PCP: Leeroy Cha, MD   Brief HPI:   61 year old with past medical history significant for bony metastasis likely secondary to multiple myeloma, pathology pending, hypertension, hypothyroidism, hyperlipidemia who presented with worsening intractable back pain.  She has had worsening back pain due to her metastatic disease.  MRI 8/3 showed worsening widespread tumor infiltrate with new extraosseous tumor in the ventral epidural space. Patient started on radiation therapy on 8/10 Also found to have moderate right pleural effusion which appeared to be loculated She underwent Port-A-Cath placement and thoracentesis on 06/17/2022   Subjective   Patient seen and examined, denies new complaints.  Underwent radiation treatment today.   Assessment/Plan:    Intractable pain/multiple myeloma -Widespread tumor infiltration with new extraosseous tumor in the ventral epidural space as per MRI from 8/3 -Pathology from bone marrow biopsy showed hypercellular bone marrow with plasma cell neoplasm -Free kappa light chain 1.153, M spike 1.8 -Dr. Marin Olp following -Started radiation treatment on 8/10 under care of Dr. Tammi Klippel -Continue Decadron 20 mg IV daily -Fentanyl patch increased from 12.5 mcg to 25 mcg to 72 hours -Continue oral hydromorphone as needed every 6 hours -Underwent Port-A-Cath placement on 06/17/2022 -Plan for Velcade/Cytoxan today Patient also getting radiation treatment, last day of radiation is on Wednesday -Patient will likely stay in the hospital till she completes radiation on Wednesday  Chest pain -Resolved -EKG was unremarkable -Patient was given 1 dose of morphine 1 mg IV x1 -Troponin x2 were 18 and 21 respectively  Moderate right pleural effusion -Underwent thoracentesis on 06/17/2022 yielding 1.2 L of exudative fluid - Started  IV Ceftriaxone and Zithromax, Protein  ration 0.6, LDH ratio 1.6, WBC 16,000 - Follow pleural fluid culture: no growth , Cytology is consistent with plasma cell dyscrasia, multiple myeloma Repeated chest x ray 8/15: Near complete resolution of right effusion, no pneumothorax. -Pulmonology recommends to change antibiotic from Zithromax to [facility] -Completed antibiotics course in the hospital  Sinus tachycardia -Unclear etiology, VQ scan was negative for PE -Started on low-dose metoprolol 25 mg p.o. twice daily  Acute kidney injury in setting of multiple myeloma -Patient presented with creatinine of 2.0, baseline creatinine 1.0 -In setting of multiple myeloma -Creatinine is stable at 2.59 -Sodium improved to 141   Hypokalemia -Potassium is 3.1 today -Replace potassium and follow BMP in am  Hyperlipidemia -Crestor 10 mg daily  Hypothyroidism -Continue Synthroid  Hypercalcemia -Presented with calcium at 13.8. -Secondary to multiple myeloma  -Received IV Zometa, calcitonin - Ca down to 8.2  Hypertension -Continue to hold lisinopril HCTZ due to acute kidney injury and hypercalcemia -Continue metoprolol 25 mg p.o. twice daily as above  Anemia of chronic disease -Anemia secondary to multiple myeloma -Patient received 2 units PRBC on 06/17/2022 -Hemoglobin is stable at 10.4  Medications     Chlorhexidine Gluconate Cloth  6 each Topical Daily   cholecalciferol  1,000 Units Oral Daily   enoxaparin (LOVENOX) injection  30 mg Subcutaneous Q24H   famciclovir  500 mg Oral Daily   fluconazole  100 mg Oral Daily   levothyroxine  88 mcg Oral Q0600   metoprolol tartrate  25 mg Oral BID   multivitamin with minerals  1 tablet Oral Daily   pantoprazole  40 mg Oral BID   protein supplement  1 Scoop Oral TID WC   rosuvastatin  10 mg Oral Daily     Data Reviewed:   CBG:  Recent  Labs  Lab 06/21/22 1609  GLUCAP 139*    SpO2: 99 % O2 Flow Rate (L/min): 2 L/min    Vitals:   06/24/22 0517 06/24/22 0540 06/24/22  1023 06/24/22 1311  BP: (!) 183/103 (!) 148/80 (!) 144/79 (!) 150/80  Pulse: 98  (!) 105 90  Resp: 20   19  Temp: 98.6 F (37 C)   98.2 F (36.8 C)  TempSrc: Oral   Oral  SpO2: 100%   99%  Weight:      Height:          Data Reviewed:  Basic Metabolic Panel: Recent Labs  Lab 06/21/22 0557 06/21/22 1500 06/22/22 0447 06/23/22 0615 06/24/22 0550  NA 146* 144 141 141 143  K 3.4* 3.6 3.6 2.9* 3.1*  CL 118* 118* 115* 119* 119*  CO2 21* 18* 21* 21* 19*  GLUCOSE 94 164* 127* 109* 110*  BUN 27* 26* 28* 24* 17  CREATININE 2.63* 2.68* 2.53* 2.59* 2.65*  CALCIUM 8.8* 8.4* 8.2* 7.8* 8.0*    CBC: Recent Labs  Lab 06/20/22 0331 06/21/22 0557 06/22/22 0447 06/23/22 0615 06/24/22 0550  WBC 7.8 7.7 8.6 4.8 4.3  NEUTROABS 5.6 5.5 6.7 3.6 2.7  HGB 10.2* 10.4* 9.9* 8.8* 9.0*  HCT 31.9* 32.8* 30.8* 27.7* 28.2*  MCV 86.0 86.3 86.0 86.0 86.0  PLT 160 157 146* 122* 119*    LFT Recent Labs  Lab 06/20/22 0331 06/21/22 0557 06/22/22 0447 06/23/22 0615 06/24/22 0550  AST 29 40 54* 45* 45*  ALT 28 32 45* 40 41  ALKPHOS 73 80 73 69 73  BILITOT 0.7 0.8 0.6 0.3 0.5  PROT 6.4* 6.7 6.6 5.7* 5.8*  ALBUMIN 3.3* 3.5 3.3* 3.0* 3.0*     Antibiotics: Anti-infectives (From admission, onward)    Start     Dose/Rate Route Frequency Ordered Stop   06/19/22 1000  azithromycin (ZITHROMAX) tablet 500 mg        500 mg Oral  Once 06/19/22 0843 06/19/22 1058   06/19/22 1000  cefdinir (OMNICEF) capsule 300 mg  Status:  Discontinued        300 mg Oral Every 12 hours 06/19/22 0843 06/20/22 0723   06/17/22 2000  azithromycin (ZITHROMAX) 500 mg in sodium chloride 0.9 % 250 mL IVPB  Status:  Discontinued        500 mg 250 mL/hr over 60 Minutes Intravenous Every 24 hours 06/17/22 1741 06/19/22 0843   06/17/22 1830  cefTRIAXone (ROCEPHIN) 2 g in sodium chloride 0.9 % 100 mL IVPB  Status:  Discontinued        2 g 200 mL/hr over 30 Minutes Intravenous Every 24 hours 06/17/22 1740 06/19/22 0843    06/15/22 1000  fluconazole (DIFLUCAN) tablet 100 mg        100 mg Oral Daily 06/14/22 2315     06/15/22 1000  famciclovir (FAMVIR) tablet 500 mg        500 mg Oral Daily 06/15/22 0703          DVT prophylaxis: Lovenox  Code Status: Full code  Family Communication: Discussed with daughter at bedside   CONSULTS oncology   Objective    Physical Examination:  General-appears in no acute distress Heart-S1-S2, regular, no murmur auscultated Lungs-clear to auscultation bilaterally, no wheezing or crackles auscultated Abdomen-soft, nontender, no organomegaly Extremities-no edema in the lower extremities Neuro-alert, oriented x3, no focal deficit noted  Status is: Inpatient:             [place]  [place]   [facility] If 7PM-7AM, please contact night-coverage at [email or web address], Office  [phone]   06/24/2022, 5:09 PM  LOS: 10 days

## 2022-06-24 NOTE — Plan of Care (Signed)
Pt rating pain 5/10.  No request for pain meds at this time.  Ambulating well with standby assist.  Problem: Health Behavior/Discharge Planning: Goal: Ability to manage health-related needs will improve Outcome: Progressing   Problem: Clinical Measurements: Goal: Ability to maintain clinical measurements within normal limits will improve Outcome: Progressing Goal: Will remain free from infection Outcome: Progressing Goal: Diagnostic test results will improve Outcome: Progressing Goal: Respiratory complications will improve Outcome: Progressing Goal: Cardiovascular complication will be avoided Outcome: Progressing   Problem: Activity: Goal: Risk for activity intolerance will decrease Outcome: Progressing   Problem: Nutrition: Goal: Adequate nutrition will be maintained Outcome: Progressing   Problem: Coping: Goal: Level of anxiety will decrease Outcome: Progressing   Problem: Elimination: Goal: Will not experience complications related to bowel motility Outcome: Progressing Goal: Will not experience complications related to urinary retention Outcome: Progressing   Problem: Pain Managment: Goal: General experience of comfort will improve Outcome: Progressing   Problem: Safety: Goal: Ability to remain free from injury will improve Outcome: Progressing   Problem: Skin Integrity: Goal: Risk for impaired skin integrity will decrease Outcome: Progressing

## 2022-06-25 ENCOUNTER — Other Ambulatory Visit: Payer: Self-pay

## 2022-06-25 ENCOUNTER — Encounter: Payer: Self-pay | Admitting: *Deleted

## 2022-06-25 ENCOUNTER — Ambulatory Visit
Admission: RE | Admit: 2022-06-25 | Discharge: 2022-06-25 | Disposition: A | Discharge status: Home / Self Care | Payer: BC Managed Care – PPO | Source: Ambulatory Visit | Attending: Radiation Oncology | Admitting: Radiation Oncology

## 2022-06-25 DIAGNOSIS — M5441 Lumbago with sciatica, right side: Secondary | ICD-10-CM | POA: Diagnosis not present

## 2022-06-25 DIAGNOSIS — I10 Essential (primary) hypertension: Secondary | ICD-10-CM | POA: Diagnosis not present

## 2022-06-25 DIAGNOSIS — N179 Acute kidney failure, unspecified: Secondary | ICD-10-CM | POA: Diagnosis not present

## 2022-06-25 DIAGNOSIS — J90 Pleural effusion, not elsewhere classified: Secondary | ICD-10-CM

## 2022-06-25 DIAGNOSIS — R52 Pain, unspecified: Secondary | ICD-10-CM | POA: Diagnosis not present

## 2022-06-25 LAB — COMPREHENSIVE METABOLIC PANEL
ALT: 45 U/L — ABNORMAL HIGH (ref 0–44)
AST: 50 U/L — ABNORMAL HIGH (ref 15–41)
Albumin: 3.1 g/dL — ABNORMAL LOW (ref 3.5–5.0)
Alkaline Phosphatase: 78 U/L (ref 38–126)
Anion gap: 4 — ABNORMAL LOW (ref 5–15)
BUN: 14 mg/dL (ref 8–23)
CO2: 19 mmol/L — ABNORMAL LOW (ref 22–32)
Calcium: 8.1 mg/dL — ABNORMAL LOW (ref 8.9–10.3)
Chloride: 120 mmol/L — ABNORMAL HIGH (ref 98–111)
Creatinine, Ser: 2.73 mg/dL — ABNORMAL HIGH (ref 0.44–1.00)
GFR, Estimated: 19 mL/min — ABNORMAL LOW (ref >60)
Glucose, Bld: 100 mg/dL — ABNORMAL HIGH (ref 70–99)
Potassium: 3.4 mmol/L — ABNORMAL LOW (ref 3.5–5.1)
Sodium: 143 mmol/L (ref 135–145)
Total Bilirubin: 0.5 mg/dL (ref 0.3–1.2)
Total Protein: 6 g/dL — ABNORMAL LOW (ref 6.5–8.1)

## 2022-06-25 LAB — CBC WITH DIFFERENTIAL/PLATELET
Abs Immature Granulocytes: 0.04 10*3/uL (ref 0.00–0.07)
Basophils Absolute: 0 10*3/uL (ref 0.0–0.1)
Basophils Relative: 0 %
Eosinophils Absolute: 0.1 10*3/uL (ref 0.0–0.5)
Eosinophils Relative: 2 %
HCT: 29 % — ABNORMAL LOW (ref 36.0–46.0)
Hemoglobin: 9.5 g/dL — ABNORMAL LOW (ref 12.0–15.0)
Immature Granulocytes: 1 %
Lymphocytes Relative: 8 %
Lymphs Abs: 0.4 10*3/uL — ABNORMAL LOW (ref 0.7–4.0)
MCH: 27.9 pg (ref 26.0–34.0)
MCHC: 32.8 g/dL (ref 30.0–36.0)
MCV: 85.3 fL (ref 80.0–100.0)
Monocytes Absolute: 1.1 10*3/uL — ABNORMAL HIGH (ref 0.1–1.0)
Monocytes Relative: 24 %
Neutro Abs: 3 10*3/uL (ref 1.7–7.7)
Neutrophils Relative %: 65 %
Platelets: 114 10*3/uL — ABNORMAL LOW (ref 150–400)
RBC: 3.4 MIL/uL — ABNORMAL LOW (ref 3.87–5.11)
RDW: 18.3 % — ABNORMAL HIGH (ref 11.5–15.5)
WBC: 4.7 10*3/uL (ref 4.0–10.5)
nRBC: 0 % (ref 0.0–0.2)

## 2022-06-25 LAB — RAD ONC ARIA SESSION SUMMARY
Course Elapsed Days: 12
Plan Fractions Treated to Date: 9
Plan Prescribed Dose Per Fraction: 2 Gy
Plan Total Fractions Prescribed: 10
Plan Total Prescribed Dose: 20 Gy
Reference Point Dosage Given to Date: 18 Gy
Reference Point Session Dosage Given: 2 Gy
Session Number: 9

## 2022-06-25 LAB — BASIC METABOLIC PANEL
Anion gap: 3 — ABNORMAL LOW (ref 5–15)
BUN: 12 mg/dL (ref 8–23)
CO2: 20 mmol/L — ABNORMAL LOW (ref 22–32)
Calcium: 7.8 mg/dL — ABNORMAL LOW (ref 8.9–10.3)
Chloride: 119 mmol/L — ABNORMAL HIGH (ref 98–111)
Creatinine, Ser: 2.64 mg/dL — ABNORMAL HIGH (ref 0.44–1.00)
GFR, Estimated: 20 mL/min — ABNORMAL LOW (ref >60)
Glucose, Bld: 111 mg/dL — ABNORMAL HIGH (ref 70–99)
Potassium: 3.8 mmol/L (ref 3.5–5.1)
Sodium: 142 mmol/L (ref 135–145)

## 2022-06-25 MED ORDER — POTASSIUM CHLORIDE 10 MEQ/100ML IV SOLN
10.0000 meq | INTRAVENOUS | Status: AC
  Administered 2022-06-25 (×4): 10 meq via INTRAVENOUS
  Filled 2022-06-25 (×4): qty 100

## 2022-06-25 NOTE — Progress Notes (Addendum)
[facility]  PROGRESS NOTE  TANELLE LANZO ZSM:[id number] DOB: 1961-03-31 DOA: 06/14/2022 PCP: Leeroy Cha, MD   Brief HPI:   61 year old with past medical history significant for bony metastasis likely secondary to multiple myeloma, pathology pending, hypertension, hypothyroidism, hyperlipidemia who presented with worsening intractable back pain.  She has had worsening back pain due to her metastatic disease.  MRI 8/3 showed worsening widespread tumor infiltrate with new extraosseous tumor in the ventral epidural space. Patient started on radiation therapy on 8/10 Also found to have moderate right pleural effusion which appeared to be loculated She underwent Port-A-Cath placement and thoracentesis on 06/17/2022   Subjective   Patient seen and examined, denies pain.  Getting radiation treatments.  Last treatment is on Wednesday.   Assessment/Plan:    Intractable pain/multiple myeloma -Widespread tumor infiltration with new extraosseous tumor in the ventral epidural space as per MRI from 8/3 -Pathology from bone marrow biopsy showed hypercellular bone marrow with plasma cell neoplasm -Free kappa light chain 1.153, M spike 1.8 -Dr. Marin Olp following -Started radiation treatment on 8/10 under care of Dr. Tammi Klippel -Continue Decadron 20 mg IV daily -Fentanyl patch increased from 12.5 mcg to 25 mcg to 72 hours -Continue oral hydromorphone as needed every 6 hours -Underwent Port-A-Cath placement on 06/17/2022 -Getting chemotherapy Velcade/Cytoxan  Patient also getting radiation treatment, last day of radiation is on Wednesday -Patient will likely stay in the hospital till she completes radiation on Wednesday  Chest pain -Resolved -EKG was unremarkable -Patient was given 1 dose of morphine 1 mg IV x1 -Troponin x2 were 18 and 21 respectively  Moderate right pleural effusion -Underwent thoracentesis on 06/17/2022 yielding 1.2 L of exudative fluid - Started  IV Ceftriaxone  and Zithromax, Protein ration 0.6, LDH ratio 1.6, WBC 16,000 - Follow pleural fluid culture: no growth , Cytology is consistent with plasma cell dyscrasia, multiple myeloma Repeated chest x ray 8/15: Near complete resolution of right effusion, no pneumothorax. -Pulmonology recommends to change antibiotic from Zithromax to [facility] -Completed antibiotics course in the hospital  Sinus tachycardia -Unclear etiology, VQ scan was negative for PE -Started on low-dose metoprolol 25 mg p.o. twice daily  Acute kidney injury in setting of multiple myeloma -Patient presented with creatinine of 2.0, baseline creatinine 1.0 -In setting of multiple myeloma -Creatinine is still rising at 2.73   Hypokalemia -Potassium is 3.4 today -Potassium replaced this morning -BMP in am  Hyperlipidemia -Crestor 10 mg daily  Hypothyroidism -Continue Synthroid  Hypercalcemia -Presented with calcium at 13.8. -Secondary to multiple myeloma  -Received IV Zometa, calcitonin - Ca down to 8.2  Hypertension -Continue to hold lisinopril HCTZ due to acute kidney injury and hypercalcemia -Continue metoprolol 25 mg p.o. twice daily as above  Anemia of chronic disease -Anemia secondary to multiple myeloma -Patient received 2 units PRBC on 06/17/2022 -Hemoglobin is stable at 10.4  Medications     Chlorhexidine Gluconate Cloth  6 each Topical Daily   cholecalciferol  1,000 Units Oral Daily   enoxaparin (LOVENOX) injection  30 mg Subcutaneous Q24H   famciclovir  500 mg Oral Daily   fluconazole  100 mg Oral Daily   levothyroxine  88 mcg Oral Q0600   lidocaine  2 patch Transdermal Q24H   metoprolol tartrate  25 mg Oral BID   multivitamin with minerals  1 tablet Oral Daily   pantoprazole  40 mg Oral BID   protein supplement  1 Scoop Oral TID WC   rosuvastatin  10 mg Oral Daily  Data Reviewed:   CBG:  Recent Labs  Lab 06/21/22 1609  GLUCAP 139*    SpO2: 99 % O2 Flow Rate (L/min): 2 L/min     Vitals:   06/25/22 0619 06/25/22 0924 06/25/22 1215 06/25/22 1218  BP: (!) 145/70 (!) 149/84    Pulse: 94 97    Resp: 17 20    Temp: 98.4 F (36.9 C) 98.3 F (36.8 C)    TempSrc: Oral Oral    SpO2: 96% 99%    Weight:   96.2 kg 96.2 kg  Height:          Data Reviewed:  Basic Metabolic Panel: Recent Labs  Lab 06/21/22 1500 06/22/22 0447 06/23/22 0615 06/24/22 0550 06/25/22 0500  NA 144 141 141 143 143  K 3.6 3.6 2.9* 3.1* 3.4*  CL 118* 115* 119* 119* 120*  CO2 18* 21* 21* 19* 19*  GLUCOSE 164* 127* 109* 110* 100*  BUN 26* 28* 24* 17 14  CREATININE 2.68* 2.53* 2.59* 2.65* 2.73*  CALCIUM 8.4* 8.2* 7.8* 8.0* 8.1*    CBC: Recent Labs  Lab 06/21/22 0557 06/22/22 0447 06/23/22 0615 06/24/22 0550 06/25/22 0500  WBC 7.7 8.6 4.8 4.3 4.7  NEUTROABS 5.5 6.7 3.6 2.7 3.0  HGB 10.4* 9.9* 8.8* 9.0* 9.5*  HCT 32.8* 30.8* 27.7* 28.2* 29.0*  MCV 86.3 86.0 86.0 86.0 85.3  PLT 157 146* 122* 119* 114*    LFT Recent Labs  Lab 06/21/22 0557 06/22/22 0447 06/23/22 0615 06/24/22 0550 06/25/22 0500  AST 40 54* 45* 45* 50*  ALT 32 45* 40 41 45*  ALKPHOS 80 73 69 73 78  BILITOT 0.8 0.6 0.3 0.5 0.5  PROT 6.7 6.6 5.7* 5.8* 6.0*  ALBUMIN 3.5 3.3* 3.0* 3.0* 3.1*     Antibiotics: Anti-infectives (From admission, onward)    Start     Dose/Rate Route Frequency Ordered Stop   06/19/22 1000  azithromycin (ZITHROMAX) tablet 500 mg        500 mg Oral  Once 06/19/22 0843 06/19/22 1058   06/19/22 1000  cefdinir (OMNICEF) capsule 300 mg  Status:  Discontinued        300 mg Oral Every 12 hours 06/19/22 0843 06/20/22 0723   06/17/22 2000  azithromycin (ZITHROMAX) 500 mg in sodium chloride 0.9 % 250 mL IVPB  Status:  Discontinued        500 mg 250 mL/hr over 60 Minutes Intravenous Every 24 hours 06/17/22 1741 06/19/22 0843   06/17/22 1830  cefTRIAXone (ROCEPHIN) 2 g in sodium chloride 0.9 % 100 mL IVPB  Status:  Discontinued        2 g 200 mL/hr over 30 Minutes Intravenous  Every 24 hours 06/17/22 1740 06/19/22 0843   06/15/22 1000  fluconazole (DIFLUCAN) tablet 100 mg        100 mg Oral Daily 06/14/22 2315     06/15/22 1000  famciclovir (FAMVIR) tablet 500 mg        500 mg Oral Daily 06/15/22 0703          DVT prophylaxis: Lovenox  Code Status: Full code  Family Communication: Discussed with daughter at bedside   CONSULTS oncology   Objective    Physical Examination:  General-appears in no acute distress Heart-S1-S2, regular, no murmur auscultated Lungs-clear to auscultation bilaterally, no wheezing or crackles auscultated Abdomen-soft, nontender, no organomegaly Extremities-no edema in the lower extremities Neuro-alert, oriented x3, no focal deficit noted  Status is: Inpatient:  [place]   [facility] If 7PM-7AM, please contact night-coverage at [email or web address], Office  [phone]   06/25/2022, 1:28 PM  LOS: 11 days

## 2022-06-25 NOTE — Progress Notes (Signed)
Michelle [name] seems to be doing better mentally.  Her cognitive state seems to be almost back to her baseline which is nice to see.  She has 2 more doses of radiation.  She really needs additional physical therapy.  I know that they worked with her yesterday.  I am not sure as to why her renal function is not improving.  Her creatinine is 2.73.  Her calcium was 8.1 with an albumin of 3.1.  Her sodium was 143 with a potassium 3.4.  She is eating okay.  She seems to be urinating okay.  There is no hematuria.  White cell count is 4.7.  Hemoglobin 9.5.  Platelet count 114,000.  There is no cough or shortness of breath.  She is not having any obvious diarrhea foreign I can tell.  Again, she will finish up her radiation as an inpatient.  Hopefully, she will be able to go home after the radiation is finished.  We can do her chemotherapy as an outpatient.  I do appreciate the great care that she is getting from the wonderful staff and 160[facility].  [name], MD  [place] 12:12

## 2022-06-25 NOTE — Plan of Care (Signed)
Patient with severe pain especially with activity. Prn Oxycodone administered. Patient started on Lidocaine patch this shift.  Problem: Health Behavior/Discharge Planning: Goal: Ability to manage health-related needs will improve Outcome: Progressing   Problem: Clinical Measurements: Goal: Ability to maintain clinical measurements within normal limits will improve Outcome: Progressing Goal: Will remain free from infection Outcome: Progressing Goal: Diagnostic test results will improve Outcome: Progressing Goal: Respiratory complications will improve Outcome: Progressing Goal: Cardiovascular complication will be avoided Outcome: Progressing   Problem: Activity: Goal: Risk for activity intolerance will decrease Outcome: Progressing   Problem: Nutrition: Goal: Adequate nutrition will be maintained Outcome: Progressing   Problem: Coping: Goal: Level of anxiety will decrease Outcome: Progressing   Problem: Elimination: Goal: Will not experience complications related to bowel motility Outcome: Progressing Goal: Will not experience complications related to urinary retention Outcome: Progressing   Problem: Pain Managment: Goal: General experience of comfort will improve Outcome: Progressing   Problem: Safety: Goal: Ability to remain free from injury will improve Outcome: Progressing   Problem: Skin Integrity: Goal: Risk for impaired skin integrity will decrease Outcome: Progressing

## 2022-06-25 NOTE — Progress Notes (Signed)
Nutrition Follow-up  DOCUMENTATION CODES:   Obesity unspecified  INTERVENTION:   -Continue Magic cup TID with meals, each supplement provides 290 kcal and 9 grams of protein   -Continue Beneprotein powder TID, each supplement provides 25 kcal and 6 grams of protein per serving  -Multivitamin with minerals daily  -Placed "High Calorie, High Protein" handout in AVS  -Re-weigh patient  NUTRITION DIAGNOSIS:   Increased nutrient needs related to cancer and cancer related treatments as evidenced by estimated needs.  Ongoing.  GOAL:   Patient will meet greater than or equal to 90% of their needs  Progressing.  MONITOR:   PO intake, Supplement acceptance, Diet advancement, Labs, Weight trends, I & O's  ASSESSMENT:   Pt admitted for tachycardia and intractable pain. PMH significant for bony metastasis likely secondary to multiple myeloma with pathology pending, HTN, hypothyroidism and HLD.  8/14: s/p PAC placement, thoracentesis  Patient in room, family at bedside.  Pt reports not eating well this morning. States she has been having off tastes but this is starting to improve. States when her pain is controlled it is easier to eat as well. Does not like supplements.  Will leave high calorie, high protein handout in AVS.  Admission weight: 197 lbs.  New weight needed.  Medications: Vitamin D, Multivitamin with minerals daily, KCl  Labs reviewed: Low K   NUTRITION - FOCUSED PHYSICAL EXAM:  No depletions noted.  Diet Order:   Diet Order             Diet regular Room service appropriate? Yes; Fluid consistency: Thin  Diet effective now                   EDUCATION NEEDS:   No education needs have been identified at this time  Skin:  Skin Assessment: Reviewed RN Assessment  Last BM:  8/21  Height:   Ht Readings from Last 1 Encounters:  06/14/22 5' 4.25" (1.632 m)    Weight:   Wt Readings from Last 1 Encounters:  06/14/22 89.5 kg    BMI:  Body  mass index is 33.62 kg/m.  Estimated Nutritional Needs:   Kcal:  1800-2000  Protein:  90-105g  Fluid:  >/=1.8L   Clayton Bibles, MS, RD, LDN Inpatient Clinical Dietitian Contact information available via Amion

## 2022-06-25 NOTE — TOC Progression Note (Addendum)
Transition of Care [facility]) - Progression Note    Patient Details  Name: AKIKO SCHEXNIDER MRN: [id number] Date of Birth: 05-Oct-1961  Transition of Care [facility]) CM/SW [place], LCSW Phone Number: 06/25/2022, 12:19 PM  Clinical Narrative:    CSW spoke with pt to discuss PT's recommendations for [facility] services, pt denies having [facility] services in the past. Pt reported not having a preferred agency. CSW informed pt she will reach out to agencies in the area and see who can accept her insurance and will provide her with an update. Pt denies any DME needs , pt reported have a walker and cane at home. TOC to follow.    [place]  1:00pm CSW spoke to [facility] with [place], for referral for [facility]. He has accepted pt for HHPT services, orders are placed. CSW will sign off , can be consulted again if any TOC needs arise.      Expected Discharge Plan: [place] Barriers to Discharge: Continued Medical Work up  Expected Discharge Plan and Services Expected Discharge Plan: [place]   Discharge Planning Services: CM Consult   Living arrangements for the past 2 months: Single Family Home                                       Social Determinants of Health (SDOH) Interventions    Readmission Risk Interventions     No data to display

## 2022-06-25 NOTE — Plan of Care (Signed)

## 2022-06-25 NOTE — Progress Notes (Signed)
Patient continues to be hospitalized. She will finish radiation this week and then likely be discharged home. She received her first cycle of chemo, velcade/cytoxan, on 06/21/2022.   Will continue to follow for post discharge needs and office follow up.   Oncology Nurse Navigator Documentation     06/25/2022   11:45 AM  Oncology Nurse Navigator Flowsheets  Phase of Treatment Chemo  Chemotherapy Actual Start Date: 06/21/2022  Navigator Follow Up Date: 06/28/2022  Navigator Follow Up Reason: Appointment Review  Navigator Location [facility]  Navigator Encounter Type Appt/Treatment Plan Review  Patient Visit Type MedOnc  Treatment Phase Active Tx  Barriers/Navigation Needs Coordination of Care;Education  Interventions None Required  Acuity Level 2-Minimal Needs (1-2 Barriers Identified)  Support Groups/Services Friends and Family  Time Spent with Patient 15

## 2022-06-26 ENCOUNTER — Encounter: Payer: Self-pay | Admitting: Urology

## 2022-06-26 ENCOUNTER — Ambulatory Visit
Admission: RE | Admit: 2022-06-26 | Discharge: 2022-06-26 | Disposition: A | Discharge status: Home / Self Care | Payer: BC Managed Care – PPO | Source: Ambulatory Visit | Attending: Radiation Oncology | Admitting: Radiation Oncology

## 2022-06-26 ENCOUNTER — Other Ambulatory Visit: Payer: Self-pay

## 2022-06-26 DIAGNOSIS — N179 Acute kidney failure, unspecified: Secondary | ICD-10-CM | POA: Diagnosis not present

## 2022-06-26 DIAGNOSIS — I10 Essential (primary) hypertension: Secondary | ICD-10-CM | POA: Diagnosis not present

## 2022-06-26 DIAGNOSIS — R52 Pain, unspecified: Secondary | ICD-10-CM | POA: Diagnosis not present

## 2022-06-26 DIAGNOSIS — C9000 Multiple myeloma not having achieved remission: Secondary | ICD-10-CM

## 2022-06-26 LAB — BASIC METABOLIC PANEL
Anion gap: 4 — ABNORMAL LOW (ref 5–15)
BUN: 11 mg/dL (ref 8–23)
CO2: 20 mmol/L — ABNORMAL LOW (ref 22–32)
Calcium: 7.8 mg/dL — ABNORMAL LOW (ref 8.9–10.3)
Chloride: 121 mmol/L — ABNORMAL HIGH (ref 98–111)
Creatinine, Ser: 2.68 mg/dL — ABNORMAL HIGH (ref 0.44–1.00)
GFR, Estimated: 19 mL/min — ABNORMAL LOW (ref >60)
Glucose, Bld: 104 mg/dL — ABNORMAL HIGH (ref 70–99)
Potassium: 3.4 mmol/L — ABNORMAL LOW (ref 3.5–5.1)
Sodium: 145 mmol/L (ref 135–145)

## 2022-06-26 LAB — RAD ONC ARIA SESSION SUMMARY
Course Elapsed Days: 13
Plan Fractions Treated to Date: 10
Plan Prescribed Dose Per Fraction: 2 Gy
Plan Total Fractions Prescribed: 10
Plan Total Prescribed Dose: 20 Gy
Reference Point Dosage Given to Date: 20 Gy
Reference Point Session Dosage Given: 2 Gy
Session Number: 10

## 2022-06-26 MED ORDER — PANTOPRAZOLE SODIUM 40 MG PO TBEC: 40.0000 mg | DELAYED_RELEASE_TABLET | Freq: Two times a day (BID) | ORAL | 1 refills | Status: DC

## 2022-06-26 MED ORDER — SODIUM CHLORIDE 0.9 % IV SOLN
8.0000 mg | Freq: Three times a day (TID) | INTRAVENOUS | Status: DC
  Administered 2022-06-26: 8 mg via INTRAVENOUS
  Filled 2022-06-26 (×2): qty 4

## 2022-06-26 MED ORDER — OXYCODONE HCL 5 MG PO TABS: 5.0000 mg | ORAL_TABLET | Freq: Four times a day (QID) | ORAL | 0 refills | Status: DC | PRN

## 2022-06-26 MED ORDER — SENNA 8.6 MG PO TABS
1.0000 | ORAL_TABLET | Freq: Every day | ORAL | 0 refills | Status: AC | PRN
Start: 2022-06-26 — End: ?

## 2022-06-26 MED ORDER — METOPROLOL TARTRATE 25 MG PO TABS: 25.0000 mg | ORAL_TABLET | Freq: Two times a day (BID) | ORAL | 1 refills | Status: DC

## 2022-06-26 MED ORDER — FAMCICLOVIR 500 MG PO TABS: 500.0000 mg | ORAL_TABLET | Freq: Every day | ORAL | 0 refills | Status: DC

## 2022-06-26 MED ORDER — RESOURCE INSTANT PROTEIN PO PWD PACKET: 1.0000 | Freq: Three times a day (TID) | ORAL | Status: DC

## 2022-06-26 MED ORDER — POTASSIUM CHLORIDE CRYS ER 20 MEQ PO TBCR
40.0000 meq | EXTENDED_RELEASE_TABLET | Freq: Once | ORAL | Status: AC
  Administered 2022-06-26: 40 meq via ORAL
  Filled 2022-06-26: qty 2

## 2022-06-26 MED ORDER — HEPARIN SOD (PORK) LOCK FLUSH 100 UNIT/ML IV SOLN
500.0000 [IU] | Freq: Once | INTRAVENOUS | Status: DC
Start: 2022-06-26 — End: 2022-06-26
  Filled 2022-06-26: qty 5

## 2022-06-26 MED ORDER — LIDOCAINE 5 % EX PTCH: 2.0000 | MEDICATED_PATCH | CUTANEOUS | 0 refills | Status: DC

## 2022-06-26 MED ORDER — ADULT MULTIVITAMIN W/MINERALS CH: 1.0000 | ORAL_TABLET | Freq: Every day | ORAL | Status: AC

## 2022-06-26 NOTE — Progress Notes (Signed)
Physical Therapy Treatment Patient Details Name: Michelle [name] MRN: [id number] DOB: 16-May-1961 Today's Date: 06/26/2022   History of Present Illness Michelle [name] is a 62 y.o. female w/ho presents with worsening intractable back pain. Of note, pt had thoracentesis for R pleural effusion 8/14.  PMH: bony metastasis likely secondary to likely multiple myeloma with pathology pending, HTN, hypothyroidism, HLD    PT Comments    Pt AxO x 3 very pleasant and willing.  Assisted OOB to amb in hallway a functional distance then assisted to bathroom.     Recommendations for follow up therapy are one component of a multi-disciplinary discharge planning process, led by the attending physician.  Recommendations may be updated based on patient status, additional functional criteria and insurance authorization.  Follow Up Recommendations  Home health PT     Assistance Recommended at Discharge Intermittent Supervision/Assistance  Patient can return home with the following A little help with walking and/or transfers;A little help with bathing/dressing/bathroom;Assistance with cooking/housework;Assist for transportation   Equipment Recommendations  None recommended by PT    Recommendations for Other Services       Precautions / Restrictions Precautions Precautions: Fall Precaution Comments: back pain, METS Restrictions Weight Bearing Restrictions: No     Mobility  Bed Mobility Overal bed mobility: Needs Assistance Bed Mobility: Supine to Sit, Sit to Supine     Supine to sit: Min guard Sit to supine: Min guard   General bed mobility comments: increased time, use of bed rail    Transfers Overall transfer level: Needs assistance Equipment used: Rolling walker (2 wheels) Transfers: Sit to/from Stand Sit to Stand: Min guard           General transfer comment: cues for hand placement.  Also assisted with a toiletbtransfer    Ambulation/Gait Ambulation/Gait assistance: Supervision,  Min guard Gait Distance (Feet): 60 Feet Assistive device: Rolling walker (2 wheels) Gait Pattern/deviations: Decreased stride length, Step-through pattern Gait velocity: decreased     General Gait Details: pt was able to amb a functional distance in hallway with walker with increased time.   Stairs             Wheelchair Mobility    Modified Rankin (Stroke Patients Only)       Balance                                            Cognition Arousal/Alertness: Awake/alert Behavior During Therapy: WFL for tasks assessed/performed Overall Cognitive Status: Within Functional Limits for tasks assessed                                 General Comments: AxO x 3 sweet Lady and very willing but limited by fatigue/pain        Exercises      General Comments        Pertinent Vitals/Pain Pain Assessment Pain Assessment: Faces Faces Pain Scale: Hurts a little bit Pain Location: back Pain Descriptors / Indicators: Aching, Sore, Grimacing Pain Intervention(s): Monitored during session, Repositioned    Home Living                          Prior Function            PT Goals (current goals can now be found  in the care plan section) Progress towards PT goals: Progressing toward goals    Frequency    Min 3X/week      PT Plan Current plan remains appropriate    Co-evaluation              AM-PAC PT "6 Clicks" Mobility   Outcome Measure  Help needed turning from your back to your side while in a flat bed without using bedrails?: A Little Help needed moving from lying on your back to sitting on the side of a flat bed without using bedrails?: A Little Help needed moving to and from a bed to a chair (including a wheelchair)?: A Little Help needed standing up from a chair using your arms (e.g., wheelchair or bedside chair)?: A Little Help needed to walk in hospital room?: A Little Help needed climbing 3-5 steps with a  railing? : A Lot 6 Click Score: 17    End of Session Equipment Utilized During Treatment: Gait belt Activity Tolerance: Patient limited by fatigue Patient left: Other (comment) (in bathroom/notified NT)   PT Visit Diagnosis: Other abnormalities of gait and mobility (R26.89);Muscle weakness (generalized) (M62.81);Difficulty in walking, not elsewhere classified (R26.2)     Time: 7471-5953 PT Time Calculation (min) (ACUTE ONLY): 14 min  Charges:  $Gait Training: 8-22 mins                     Rica Koyanagi  PTA [place] Office M-F          [phone] Weekend pager [phone]

## 2022-06-26 NOTE — Discharge Instructions (Addendum)

## 2022-06-26 NOTE — Progress Notes (Signed)
Everything is about the same for Michelle [name].  Hopefully, she will finish up her radiation therapy today.  She has had no fever.  She has had no mouth sores.  She has had little bit of nausea.  She says she has not having diarrhea.  There is no bleeding.  Unfortunately I do not think a CBC was done yet today.  Also not sure how much she really is eating.  Her kidney function is stable today.  Her BUN is 11 creatinine 2.68.  Calcium is 7.8.  Her albumin count yesterday, was 3.1.  As such, her calcium does correct and should be normal.  Her potassium is slightly low at 3.4.  I really do not think that she needs to be on a cardiac monitor any longer.  She says her pain is manageable.  She does have a lot of impact on her back.  Maybe, some gabapentin might help.  Again, she will finish her radiation therapy today.  She is due for next round of chemotherapy on Friday.  As an outpatient, we can add Faspro.  Ultimately, I still would like to get her to a stem cell transplant.  Might be a little bit better with a creatinine of 2.64.  Her vital signs are temperature 98.1.  Pulse 99.  Blood pressure 149/88.  Her lungs sound clear bilaterally.  Cardiac exam regular rate and rhythm.  Abdomen is soft.  Bowel sounds are clearly present.  There is no distention.  There is no fluid wave.  Extremity shows no clubbing, cyanosis or edema.  There may be some slight swelling in her lower legs.  Neurological exam is nonfocal.  Again, Ms. Hildebrant finishes her radiation today.  She is having some sternal pain.  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  Maybe, she will go home today.  If not today, then clearly she should be able to go home tomorrow.  I know that she had wonderful care from the staff upon 6 E.  [name], MD  [place] 6:9

## 2022-06-26 NOTE — Discharge Summary (Signed)
Physician Discharge Summary  Michelle [name] VZD:[id number] DOB: Mar 17, 1961  PCP: Michelle Cha, MD  Admitted from: Home Discharged to: Home  Admit date: 06/14/2022 Discharge date: 06/26/2022  Recommendations for Outpatient Follow-up:    Follow-up Information     Michelle Cha, MD. Schedule an appointment as soon as possible for a visit in 1 week(s).   Specialty: Internal Medicine Why: To be seen with repeat labs (CBC & CMP). Contact information: [address] [place] [address] [phone]         Michelle Napoleon, MD. Schedule an appointment as soon as possible for a visit.   Specialty: Oncology Why: Call MDs office for an appointment. Contact information: [place] [address] [phone]                  Home Health: Home Health Orders (From admission, onward)     Start     Ordered   06/25/22 [place]  At discharge       Question:  To provide the following care/treatments  Answer:  PT   06/25/22 1327             Equipment/Devices: None    Discharge Condition: Improved and stable.   Code Status: Full Code Diet recommendation:  Discharge Diet Orders (From admission, onward)     Start     Ordered   06/26/22 0000  Diet - low sodium heart healthy        06/26/22 1420             Discharge Diagnoses:  Principal Problem:   Intractable pain Active Problems:   Essential hypertension   Hypothyroidism   Metastasis to bone (HCC)   Hypercalcemia   AKI (acute kidney injury) ([place])   Metabolic acidosis   Tachycardia   Multiple myeloma without remission (HCC)   Pleural effusion   Brief Summary: 61 year old with past medical history significant for bony metastasis likely secondary to multiple myeloma, hypertension, hypothyroidism, hyperlipidemia who presented with worsening intractable back pain. She has had worsening back pain due to her metastatic disease.  MRI 8/3  showed worsening widespread tumor infiltrate with new extraosseous tumor in the ventral epidural space. Patient was started on radiation therapy on 8/10. Also found to have moderate right pleural effusion which appeared to be loculated. She underwent Port-A-Cath placement and thoracentesis on 06/17/2022   Assessment and plan:  Intractable pain/multiple myeloma -Widespread tumor infiltration with new extraosseous tumor in the ventral epidural space as per MRI from 8/3 -Pathology from bone marrow biopsy showed hypercellular bone marrow with plasma cell neoplasm -Free kappa light chain 1.153, M spike 1.8 -Dr. Marin Olp following and his input from today appreciated. -Started radiation treatment on 8/10 under care of Dr. Tammi Klippel -Completed a course of Decadron in the hospital.  Currently not on any steroids. -Fentanyl patch increased from 12.5 mcg to 25 mcg to 72 hours but fentanyl patch has since been discontinued. -Was on oral hydromorphone which has since been stopped. -Underwent Port-A-Cath placement on 06/17/2022 -Getting chemotherapy Velcade/Cytoxan  Patient also getting radiation treatment, completed last course of radiation on 07/27/2022. -Pain better controlled. -Patient was started on famciclovir by Dr. Marin Olp, prescribed 10-day supply at discharge and this needs to be reviewed and reviewed by him if deemed to be continued.   Chest pain -Resolved -EKG was unremarkable -Patient was given 1 dose of morphine 1 mg IV x1 -Troponin x2 were 18 and 21  respectively   Moderate right pleural effusion -Underwent thoracentesis on 06/17/2022 yielding 1.2 L of exudative fluid - Started  IV Ceftriaxone and Zithromax, Protein ration 0.6, LDH ratio 1.6, WBC 16,000.  Antibiotics have since been discontinued 8/16. - Follow pleural fluid culture: no growth , Cytology is consistent with plasma cell dyscrasia, multiple myeloma Repeated chest x ray 8/15: Near complete resolution of right effusion, no  pneumothorax. -Completed antibiotics course in the hospital   Sinus tachycardia -Unclear etiology, VQ scan was negative for PE -Started on low-dose metoprolol 25 mg p.o. twice daily.  Currently in sinus rhythm on telemetry.   Acute kidney injury in setting of multiple myeloma -Patient presented with creatinine of 2.0, baseline creatinine 1.0 -In setting of multiple myeloma -Creatinine has essentially been stable in the 2.5-2.7 range since 8/19.?  Myeloma kidney. -Avoid nephrotoxins.  Discontinued PTA Zestoretic.    Hypokalemia -Replaced prior to discharge.   Hyperlipidemia -Crestor 10 mg daily.  Periodically monitor LFTs as outpatient.   Hypothyroidism -Continue Synthroid   Hypercalcemia -Presented with calcium at 13.8. -Secondary to multiple myeloma  -Received IV Zometa, calcitonin - Ca down to 7.8.   Hypertension -Disc continued lisinopril HCTZ due to acute kidney injury and hypercalcemia -Continue metoprolol 25 mg p.o. twice daily as above   Anemia of chronic disease -Anemia secondary to multiple myeloma and chemotherapy. -Patient received 2 units PRBC on 06/17/2022 -Hemoglobin stable in the 8-9 range.  Consultations: PCCM Medical oncology  Procedures: As noted above   Discharge Instructions  Discharge Instructions     Infusion Appointment Request   Complete by: Jun 21, 2022    Contact your oncology clinic or infusion center to schedule this appointment.   Lab Appointment Request   Complete by: Jun 21, 2022    Contact your oncology clinic or infusion center to schedule this appointment.   Infusion Appointment Request   Complete by: Jun 24, 2022    Contact your oncology clinic or infusion center to schedule this appointment.   Call MD for:  difficulty breathing, headache or visual disturbances   Complete by: As directed    Call MD for:  extreme fatigue   Complete by: As directed    Call MD for:  persistant dizziness or light-headedness   Complete by: As  directed    Call MD for:  persistant nausea and vomiting   Complete by: As directed    Call MD for:  severe uncontrolled pain   Complete by: As directed    Call MD for:  temperature >100.4   Complete by: As directed    Diet - low sodium heart healthy   Complete by: As directed    Increase activity slowly   Complete by: As directed    No wound care   Complete by: As directed    Infusion Appointment Request   Complete by: Jun 28, 2022    Contact your oncology clinic or infusion center to schedule this appointment.   Lab Appointment Request   Complete by: Jun 28, 2022    Contact your oncology clinic or infusion center to schedule this appointment.   Infusion Appointment Request   Complete by: Jul 01, 2022    Contact your oncology clinic or infusion center to schedule this appointment.        Medication List     STOP taking these medications    dexamethasone 4 MG tablet Commonly known as: DECADRON   fentaNYL 12 MCG/HR Commonly known as: DURAGESIC   gabapentin 300  MG capsule Commonly known as: NEURONTIN   HYDROmorphone 2 MG tablet Commonly known as: Dilaudid   lisinopril-hydrochlorothiazide 10-12.5 MG tablet Commonly known as: ZESTORETIC       TAKE these medications    cholecalciferol 25 MCG (1000 UNIT) tablet Commonly known as: VITAMIN D3 Take 1,000 Units by mouth daily.   CoQ10 100 MG Caps Take 1 capsule by mouth daily.   famciclovir 500 MG tablet Commonly known as: FAMVIR Take 1 tablet (500 mg total) by mouth daily. Start taking on: June 27, 2022   fluconazole 100 MG tablet Commonly known as: DIFLUCAN Take 1 tablet (100 mg total) by mouth daily.   levothyroxine 88 MCG tablet Commonly known as: SYNTHROID 1 TABLET EVERY MORNING ON AN EMPTY STOMACH ORALLY ONCE A DAY 90 DAYS   lidocaine 5 % Commonly known as: LIDODERM Place 2 patches onto the skin daily. Remove & Discard patch within 12 hours or as directed by MD   metoprolol tartrate 25 MG  tablet Commonly known as: LOPRESSOR Take 1 tablet (25 mg total) by mouth 2 (two) times daily.   multivitamin with minerals Tabs tablet Take 1 tablet by mouth daily. Start taking on: June 27, 2022   oxyCODONE 5 MG immediate release tablet Commonly known as: Oxy IR/ROXICODONE Take 1 tablet (5 mg total) by mouth every 6 (six) hours as needed for severe pain or moderate pain.   pantoprazole 40 MG tablet Commonly known as: PROTONIX Take 1 tablet (40 mg total) by mouth 2 (two) times daily.   prochlorperazine 10 MG tablet Commonly known as: COMPAZINE Take 1 tablet (10 mg total) by mouth every 6 (six) hours as needed for nausea or vomiting.   protein supplement 6 g Powd Commonly known as: RESOURCE BENEPROTEIN Take 1 Scoop (6 g total) by mouth 3 (three) times daily with meals.   rosuvastatin 10 MG tablet Commonly known as: CRESTOR TAKE 1 TABLET BY MOUTH EVERY DAY FOR 90 DAYS   senna 8.6 MG Tabs tablet Commonly known as: SENOKOT Take 1 tablet (8.6 mg total) by mouth daily as needed for mild constipation or moderate constipation.       Allergies  Allergen Reactions   Penicillins Other (See Comments)    Severe Yeast infection... No deathly reactions   Codeine Nausea Only   Morphine Other (See Comments)    headache   Tramadol     headache      Procedures/Studies: DG Chest 2 View  Result Date: 06/22/2022 CLINICAL DATA:  Pleural effusion, recent thoracentesis, back pain EXAM: CHEST - 2 VIEW COMPARISON:  CT chest 06/19/2022 FINDINGS: Small right pleural effusion. Trace left pleural effusion. No focal consolidation. No pneumothorax. Heart and mediastinal contours are unremarkable. Left-sided Port-A-Cath in satisfactory position. Osseous metastatic disease not well visualized on the current examination better seen on the CT of the chest dated 06/19/2022. T12 and L1 vertebral body compression fractures. T10 vertebral body compression fracture. IMPRESSION: 1. Small right and trace  left pleural effusions. No pneumothorax. 2. T10, T12 and L1 vertebral body compression fractures. Electronically Signed   By: Kathreen Devoid M.D.   On: 06/22/2022 13:39   CT CHEST WO CONTRAST  Result Date: 06/19/2022 CLINICAL DATA:  Pleural effusion. History of multiple myeloma. * Tracking Code: BO * EXAM: CT CHEST WITHOUT CONTRAST TECHNIQUE: Multidetector CT imaging of the chest was performed following the standard protocol without IV contrast. RADIATION DOSE REDUCTION: This exam was performed according to the departmental dose-optimization program which includes automated exposure control, adjustment  of the mA and/or kV according to patient size and/or use of iterative reconstruction technique. COMPARISON:  Chest radiographs including 06/18/2012. PET of 05/13/2012. FINDINGS: Cardiovascular: A left-sided Port-A-Cath tip at mid right atrium. Aberrant right subclavian artery traversing posterior to the esophagus. Aortic atherosclerosis. Tortuous descending thoracic aorta. Normal heart size, without pericardial effusion. Three vessel coronary artery calcification. Mediastinum/Nodes: Right axillary nodes measure up to 9 mm, similar. No left axillary adenopathy. Prior thyroidectomy. No mediastinal or definite hilar adenopathy, given limitations of unenhanced CT. Lungs/Pleura: Trace right pleural fluid is new since 05/13/2022. Moderate right-sided pleural effusion is similar to on the prior PET. Anterior right-sided focal pleural thickening of 6 mm on 73/2 is similar to on the prior PET. Increase in areas of mild patchy ground-glass within the right lower lobe. Suggestion of concurrent mild peribronchovascular nodularity including on 59/5. Dependent atelectasis in the left lower lobe. Upper Abdomen: Normal imaged portions of the liver, spleen, stomach, pancreas, gallbladder, adrenal glands, kidneys. Musculoskeletal: Innumerable, relatively diffuse lytic lesions throughout the bones. Progressive compared to 05/13/22  PET. For example, a left-sided vertebral body lesion at T10 measures 2.3 cm on 105/2 and is slightly more well-defined than at 2.1 cm on the prior PET. Presumably pathologic fracture at T10 is mild to moderate. Mild compression deformities are seen at T12 and L1. Right-sided paravertebral component at T8-9 measures 2.0 x 1.7 cm on 90/2 versus 1.8 x 1.4 cm when remeasured on the prior PET. Expansile mass in the anterior aspect of the left fourth rib measures 3.5 x 2.2 cm on 77/2 versus 2.6 x 1.5 cm on the prior PET (when remeasured). Dominant expansile right sixth rib soft tissue mass measures 4.1 cm in thickness today versus 3.8 cm on the prior PET. IMPRESSION: 1. Since the PET of 05/13/2022, progression of osseous myeloma as detailed above. 2. Similar moderate right and small left pleural effusions. 3. Worsened right lower lobe aeration with ground-glass and peribronchovascular nodular opacities. Favor asymmetric pulmonary edema over atypical infection. 4. Age advanced coronary artery atherosclerosis. Recommend assessment of coronary risk factors. 5.  Aortic Atherosclerosis (ICD10-I70.0). Electronically Signed   By: Abigail Miyamoto M.D.   On: 06/19/2022 15:41   DG CHEST PORT 1 VIEW  Result Date: 06/18/2022 CLINICAL DATA:  Follow-up thoracentesis EXAM: PORTABLE CHEST 1 VIEW COMPARISON:  06/15/2022 FINDINGS: Power port in place from a left-sided approach with the tip in the proximal right atrium. No pneumothorax. Previously seen right effusion nearly completely resolved. No pneumothorax. Minimal basilar atelectasis. IMPRESSION: Good appearance following right thoracentesis. Near complete resolution of right effusion. No pneumothorax. New power port in place with the tip in the proximal right atrium. Electronically Signed   By: Nelson Chimes M.D.   On: 06/18/2022 11:16   IR IMAGING GUIDED PORT INSERTION  Result Date: 06/17/2022 INDICATION: Myeloma. EXAM: IMPLANTED PORT A CATH PLACEMENT WITH ULTRASOUND AND  FLUOROSCOPIC GUIDANCE MEDICATIONS: None ANESTHESIA/SEDATION: Moderate (conscious) sedation was employed during this procedure. A total of Versed 2 mg and Fentanyl 100 mcg was administered intravenously. Moderate Sedation Time: 49 minutes. The patient's level of consciousness and vital signs were monitored continuously by radiology nursing throughout the procedure under my direct supervision. FLUOROSCOPY TIME:  Fluoroscopic dose; 2 mGy COMPLICATIONS: None immediate. PROCEDURE: The procedure, risks, benefits, and alternatives were explained to the the patient and/or patient's representative . Questions regarding the procedure were encouraged and answered. The patient understands and consents to the procedure. The LEFT neck and chest were prepped with chlorhexidine in a sterile fashion,  and a sterile drape was applied covering the operative field. Maximum barrier sterile technique with sterile gowns and gloves were used for the procedure. A timeout was performed prior to the initiation of the procedure. Local anesthesia was provided with 1% lidocaine with epinephrine. After creating a small venotomy incision, a micropuncture kit was utilized to access the internal jugular vein under direct, real-time ultrasound guidance. Ultrasound image documentation was performed. The microwire was kinked to measure appropriate catheter length. A subcutaneous port pocket was then created along the upper chest wall utilizing a combination of sharp and blunt dissection. The pocket was irrigated with sterile saline. A single lumen ISP power injectable port was chosen for placement. The 8 Fr catheter was tunneled from the port pocket site to the venotomy incision. The port was placed in the pocket. The external catheter was trimmed to appropriate length. At the venotomy, an 8 Fr peel-away sheath was placed over a guidewire under fluoroscopic guidance. The catheter was then placed through the sheath and the sheath was removed. Final  catheter positioning was confirmed and documented with a fluoroscopic spot radiograph. The port was accessed with a Huber needle, aspirated and flushed with heparinized saline. The port pocket incision was closed with interrupted 3-0 Vicryl suture then Dermabond was applied, including at the venotomy incision. Dressings were placed. The patient tolerated the procedure well without immediate post procedural complication. IMPRESSION: Successful placement of a LEFT internal jugular approach power injectable Port-A-Cath. The tip of the catheter is positioned within the proximal RIGHT atrium. The catheter is ready for immediate use. Roanna Banning, MD [facility] Radiology Electronically Signed   By: Roanna Banning M.D.   On: 06/17/2022 17:15   IR THORACENTESIS ASP PLEURAL SPACE W/IMG GUIDE  Result Date: 06/17/2022 INDICATION: RIGHT pleural effusion EXAM: ULTRASOUND GUIDED RIGHT THORACENTESIS MEDICATIONS: None. COMPLICATIONS: None immediate. PROCEDURE: An ultrasound guided thoracentesis was thoroughly discussed with the patient and questions answered. The benefits, risks, alternatives and complications were also discussed. The patient understands and wishes to proceed with the procedure. Written consent was obtained. Ultrasound was performed to localize and mark an adequate pocket of fluid in the RIGHT chest. The area was then prepped and draped in the normal sterile fashion. 1% Lidocaine was used for local anesthesia. Under ultrasound guidance a 8 Fr Safe-T-Centesis catheter was introduced. Thoracentesis was performed. The catheter was removed and a dressing applied. FINDINGS: 1. A total of approximately 1.2 L of serosanguineous pleural fluid was removed. Samples were sent to the laboratory as requested by the clinical team. 2. An intra procedural XR of the RIGHT chest was NEGATIVE for pneumothorax. IMPRESSION: Successful ultrasound guided RIGHT diagnostic and therapeutic  thoracentesis yielding 1.2 L of pleural fluid. Roanna Banning, MD [facility] Radiology Electronically Signed   By: Roanna Banning M.D.   On: 06/17/2022 17:12   ECHOCARDIOGRAM COMPLETE  Result Date: 06/16/2022    ECHOCARDIOGRAM REPORT   Patient Name:   Michelle [name] [facility] Date of Exam: 06/16/2022 Medical Rec #:  [id number]    Height:       64.3 in Accession #:    [id number]   Weight:       197.4 lb Date of Birth:  06/05/1961    BSA:          1.951 m Patient Age:    61 years     BP:           143/80 mmHg Patient Gender: F  HR:           109 bpm. Exam Location:  Inpatient Procedure: 2D Echo, Cardiac Doppler, Color Doppler and Strain Analysis Indications:    R94.31 Abnormal EKG  History:        Patient has no prior history of Echocardiogram examinations.                 Abnormal ECG, Arrythmias:Tachycardia; Risk Factors:Hypertension                 and Dyslipidemia. Metastatic cancer.  Sonographer:    Roseanna Rainbow RDCS Referring Phys: [id number] A REGALADO  Sonographer Comments: Image acquisition challenging due to patient body habitus. Global longitudinal strain was attempted. IMPRESSIONS  1. Left ventricular ejection fraction, by estimation, is 65 to 70%. The left ventricle has normal function. The left ventricle has no regional wall motion abnormalities. Indeterminate diastolic filling due to E-A fusion. The average left ventricular global longitudinal strain is -24.0 %. The global longitudinal strain is normal.  2. Right ventricular systolic function is normal. The right ventricular size is normal. There is mildly elevated pulmonary artery systolic pressure.  3. Large pleural effusion in the right lateral region.  4. The mitral valve is normal in structure. No evidence of mitral valve regurgitation. No evidence of mitral stenosis.  5. The aortic valve is tricuspid. Aortic valve regurgitation is not visualized. No aortic stenosis is present.  6. The inferior vena cava  is normal in size with <50% respiratory variability, suggesting right atrial pressure of 8 mmHg. Comparison(s): No prior Echocardiogram. FINDINGS  Left Ventricle: Left ventricular ejection fraction, by estimation, is 65 to 70%. The left ventricle has normal function. The left ventricle has no regional wall motion abnormalities. The average left ventricular global longitudinal strain is -24.0 %. The global longitudinal strain is normal. The left ventricular internal cavity size was normal in size. There is no left ventricular hypertrophy. Indeterminate diastolic filling due to E-A fusion. Right Ventricle: The right ventricular size is normal. No increase in right ventricular wall thickness. Right ventricular systolic function is normal. There is mildly elevated pulmonary artery systolic pressure. The tricuspid regurgitant velocity is 2.69  m/s, and with an assumed right atrial pressure of 8 mmHg, the estimated right ventricular systolic pressure is 96.0 mmHg. Left Atrium: Left atrial size was normal in size. Right Atrium: Right atrial size was normal in size. Pericardium: Trivial pericardial effusion is present. Mitral Valve: The mitral valve is normal in structure. No evidence of mitral valve regurgitation. No evidence of mitral valve stenosis. MV peak gradient, 9.7 mmHg. The mean mitral valve gradient is 4.0 mmHg. Tricuspid Valve: The tricuspid valve is normal in structure. Tricuspid valve regurgitation is not demonstrated. No evidence of tricuspid stenosis. Aortic Valve: The aortic valve is tricuspid. Aortic valve regurgitation is not visualized. No aortic stenosis is present. Pulmonic Valve: The pulmonic valve was normal in structure. Pulmonic valve regurgitation is not visualized. No evidence of pulmonic stenosis. Aorta: The aortic root and ascending aorta are structurally normal, with no evidence of dilitation. Venous: The inferior vena cava is normal in size with less than 50% respiratory variability,  suggesting right atrial pressure of 8 mmHg. IAS/Shunts: No atrial level shunt detected by color flow Doppler. Additional Comments: There is a large pleural effusion in the right lateral region.  LEFT VENTRICLE PLAX 2D LVIDd:         4.20 cm     Diastology LVIDs:  2.30 cm     LV e' medial:    3.73 cm/s LV PW:         1.20 cm     LV E/e' medial:  24.0 LV IVS:        1.00 cm     LV e' lateral:   11.20 cm/s LVOT diam:     1.90 cm     LV E/e' lateral: 8.0 LV SV:         71 LV SV Index:   36          2D Longitudinal Strain LVOT Area:     2.84 cm    2D Strain GLS Avg:     -24.0 %  LV Volumes (MOD) LV vol d, MOD A2C: 81.1 ml LV vol d, MOD A4C: 69.0 ml LV vol s, MOD A2C: 28.3 ml LV vol s, MOD A4C: 21.0 ml LV SV MOD A2C:     52.8 ml LV SV MOD A4C:     69.0 ml LV SV MOD BP:      50.4 ml RIGHT VENTRICLE             IVC RV S prime:     21.30 cm/s  IVC diam: 1.60 cm TAPSE (M-mode): 2.4 cm LEFT ATRIUM           Index        RIGHT ATRIUM           Index LA diam:      2.90 cm 1.49 cm/m   RA Area:     10.70 cm LA Vol (A2C): 17.5 ml 8.97 ml/m   RA Volume:   26.10 ml  13.38 ml/m LA Vol (A4C): 22.4 ml 11.48 ml/m  AORTIC VALVE LVOT Vmax:   135.00 cm/s LVOT Vmean:  88.900 cm/s LVOT VTI:    0.249 m  AORTA Ao Root diam: 3.20 cm Ao Asc diam:  3.30 cm MITRAL VALVE                TRICUSPID VALVE MV Area (PHT): 4.94 cm     TR Peak grad:   28.9 mmHg MV Area VTI:   3.43 cm     TR Vmax:        269.00 cm/s MV Peak grad:  9.7 mmHg MV Mean grad:  4.0 mmHg     SHUNTS MV Vmax:       1.56 m/s     Systemic VTI:  0.25 m MV Vmean:      93.2 cm/s    Systemic Diam: 1.90 cm MV Decel Time: 154 msec MV E velocity: 89.37 cm/s MV A velocity: 139.67 cm/s MV E/A ratio:  0.64 Rudean Haskell MD Electronically signed by Rudean Haskell MD Signature Date/Time: 06/16/2022/2:47:42 PM    Final    VAS [place] LOWER EXTREMITY VENOUS (DVT)  Result Date: 06/16/2022  Lower Venous DVT Study Patient Name:  VIVICA DOBOSZ [facility]  Date of Exam:   06/15/2022 Medical  Rec #: [id number]     Accession #:    [id number] Date of Birth: July 11, 1961     Patient Gender: F Patient Age:   56 years Exam Location:  [facility] Procedure:      VAS [place] LOWER EXTREMITY VENOUS (DVT) Referring Phys: Jerald Kief REGALADO --------------------------------------------------------------------------------  Indications: Pain.  Comparison Study: No previous exam noted. Performing Technologist: Bobetta Lime BS, RVT  Examination Guidelines: A complete evaluation includes B-mode imaging, spectral Doppler, color Doppler, and power Doppler as needed of all  accessible portions of each vessel. Bilateral testing is considered an integral part of a complete examination. Limited examinations for reoccurring indications may be performed as noted. The reflux portion of the exam is performed with the patient in reverse Trendelenburg.  +---------+---------------+---------+-----------+----------+--------------+ RIGHT    CompressibilityPhasicitySpontaneityPropertiesThrombus Aging +---------+---------------+---------+-----------+----------+--------------+ CFV      Full           Yes      Yes                                 +---------+---------------+---------+-----------+----------+--------------+ SFJ      Full                                                        +---------+---------------+---------+-----------+----------+--------------+ FV Prox  Full                                                        +---------+---------------+---------+-----------+----------+--------------+ FV Mid   Full                                                        +---------+---------------+---------+-----------+----------+--------------+ FV DistalFull                                                        +---------+---------------+---------+-----------+----------+--------------+ PFV      Full                                                         +---------+---------------+---------+-----------+----------+--------------+ POP      Full           Yes      Yes                                 +---------+---------------+---------+-----------+----------+--------------+ PTV      Full                                                        +---------+---------------+---------+-----------+----------+--------------+ PERO     Full                                                        +---------+---------------+---------+-----------+----------+--------------+   +----+---------------+---------+-----------+----------+--------------+ LEFTCompressibilityPhasicitySpontaneityPropertiesThrombus Aging +----+---------------+---------+-----------+----------+--------------+  CFV Full           Yes      Yes                                 +----+---------------+---------+-----------+----------+--------------+     Summary: RIGHT: - No evidence of deep vein thrombosis in the lower extremity. No indirect evidence of obstruction proximal to the inguinal ligament. - No cystic structure found in the popliteal fossa.  LEFT: - No evidence of common femoral vein obstruction.  *See table(s) above for measurements and observations. Electronically signed by Orlie Pollen on 06/16/2022 at 10:42:18 AM.    Final    DG Chest 2 View  Result Date: 06/15/2022 CLINICAL DATA:  Chest pain and shortness of breath.  Tachycardia. EXAM: CHEST - 2 VIEW COMPARISON:  Same day nuclear medicine pulmonary perfusion scan. FINDINGS: The heart size and mediastinal contours are partially obscured but appear within normal limits. Partially loculated moderate-sized right pleural effusion with adjacent atelectasis versus consolidation. Left lung is clear. No acute osseous abnormality. IMPRESSION: Partially loculated moderate right pleural effusion with adjacent atelectasis versus consolidation Electronically Signed   By: Dahlia Bailiff M.D.   On: 06/15/2022 12:54   NM Pulmonary  Perfusion  Result Date: 06/15/2022 CLINICAL DATA:  Chest pain and shortness of breath concern for pulmonary embolus EXAM: NUCLEAR MEDICINE PERFUSION LUNG SCAN TECHNIQUE: Perfusion images were obtained in multiple projections after intravenous injection of radiopharmaceutical. Ventilation scans intentionally deferred if perfusion scan and chest x-ray adequate for interpretation during COVID 19 epidemic. RADIOPHARMACEUTICALS:  4.0 mCi Tc-3m MAA IV COMPARISON:  Same day chest radiograph.  PET-CT May 13, 2022 FINDINGS: Decreased perfusion throughout the right hemithorax with linear band of hypoperfusion seen along the major fissure (line sign) which corresponds with a moderate-sized pleural effusion seen on same day chest radiograph. No segmental mismatched perfusion defects identified. IMPRESSION: 1. No convincing scintigraphic evidence of pulmonary embolus. 2. Matched perfusion defect throughout the right hemithorax with line sign corresponding with moderate-sized pleural effusion and atelectasis/consolidation seen on same day chest radiograph. Electronically Signed   By: Dahlia Bailiff M.D.   On: 06/15/2022 12:52   CT BONE MARROW BIOPSY & ASPIRATION  Result Date: 06/07/2022 INDICATION: 61 year old with numerous lytic bone lesions and concern for myeloma. Request for bone marrow biopsy. EXAM: CT GUIDED BONE MARROW ASPIRATES AND BIOPSY Physician: Stephan Minister. Henn, MD MEDICATIONS: Moderate sedation ANESTHESIA/SEDATION: Moderate (conscious) sedation was employed during this procedure. A total of Versed 2.$RemoveBef'0mg'tPGzRdnWqR$  and fentanyl 100 mcg and 25 mg Benadryl was administered intravenously at the order of the provider performing the procedure. Total intra-service moderate sedation time: 19 minutes. Patient's level of consciousness and vital signs were monitored continuously by radiology nurse throughout the procedure under the supervision of the provider performing the procedure. COMPLICATIONS: None immediate. PROCEDURE: The  procedure was explained to the patient. The risks and benefits of the procedure were discussed and the patient's questions were addressed. Informed consent was obtained from the patient. The patient was placed prone on CT table. Images of the pelvis were obtained. The left side of back was prepped and draped in sterile fashion. The skin and left posterior ilium were anesthetized with 1% lidocaine. 11 gauge bone needle was directed into the left ilium with CT guidance. Two aspirates and two core biopsies were obtained. Bandage placed over the puncture site. RADIATION DOSE REDUCTION: This exam was performed according to the departmental dose-optimization program which  includes automated exposure control, adjustment of the mA and/or kV according to patient size and/or use of iterative reconstruction technique. FINDINGS: Large destructive lytic lesion involving the left ilium. Scattered lytic lesions throughout the right ilium. Biopsy was obtained from the posterior left ilium in order to avoid the lytic lesions. Biopsy needle was directed in the posterior left ilium. Initially it was difficult to get any aspirate material. Eventually some aspirate material was obtained. Two core biopsies were obtained. IMPRESSION: CT guided bone marrow aspiration and core biopsy. Electronically Signed   By: Markus Daft M.D.   On: 06/07/2022 13:39   MR Lumbar Spine W Wo Contrast  Result Date: 06/06/2022 CLINICAL DATA:  Low back pain. Cauda carina syndrome suspected. Severe pain and right leg radiation. Hematologic malignancy. EXAM: MRI LUMBAR SPINE WITHOUT AND WITH CONTRAST TECHNIQUE: Multiplanar and multiecho pulse sequences of the lumbar spine were obtained without and with intravenous contrast. CONTRAST:  92mL GADAVIST GADOBUTROL 1 MMOL/ML IV SOLN COMPARISON:  MRI lumbar spine 04/27/2022 FINDINGS: Segmentation:  5 lumbar vertebra Alignment:  Normal Vertebrae: Widespread and diffuse bone marrow abnormality consistent with tumor  infiltration. This is present throughout the lumbar spine as well as the sacrum and iliac bones. Bony tumor infiltration has progressed in the interval. Mild fracture of T12 unchanged. There is now extraosseous tumor in the ventral epidural space and to the left of the vertebral body which was not seen previously. Mild fracture of L1 is unchanged.  No epidural tumor Moderate fracture of L3 is unchanged. 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 No epidural tumor Mild compression fracture of L5 is unchanged. Ventral epidural tumor has developed in the interval with borderline spinal stenosis. Multiple enhancing lesions in the spinous processes of T12, L1, L2, L3, L4 best seen on postcontrast images. These are difficult to see on the prior noncontrast CT. Expansile lesion in the left iliac wing has progressed. Conus medullaris and cauda equina: Conus extends to the L2 level. Conus and cauda equina appear normal. Paraspinal and other soft tissues: Progression of paraspinous tumor on the left at L1 and on the right at L3. Bilateral iliac lymph nodes have enlarged measuring 16 mm on the right and 13 mm on the left Disc levels: T12-L1: Ventral epidural tumor at T12 without cord compression L1-2: Negative L2-3: Negative L3-4: Mild disc and moderate facet degeneration. Negative for stenosis L4-5: Mild degenerative change in the disc and facets without stenosis. L5-S1: Disc and facet degeneration.  Negative for stenosis. IMPRESSION: 1. Widespread tumor infiltration of the bone marrow has progressed in the interval. 2. Interval development of ventral epidural tumor extension at T12 and L5. Progressive paraspinous tumor infiltration lateral to the vertebral body on the right at L3. 3. Expansile lesion left iliac bone has progressed. 4. Iliac lymph nodes are enlarged consistent with tumor involvement and have progressed in the interval. Electronically Signed   By: Franchot Gallo M.D.   On: 06/06/2022 17:33       Subjective: Patient's daughter at bedside.  Feels improved.  Pain controlled, rates at 3/10 in severity across the lower back.  Indicates that she ambulated the halls with therapy.  Appetite continues to improve.  Currently denies nausea.  No vomiting.  Having BMs.  Discharge Exam:  Vitals:   06/25/22 1300 06/25/22 2052 06/26/22 0506 06/26/22 1319  BP: (!) 150/80 (!) 165/79 (!) 149/88 (!) 147/77  Pulse: 90 (!) 109 99 92  Resp: $Remo'19 18 18 18  'UVctS$ Temp: 98.2 F (36.8 C)  98.2 F (36.8 C) 98.1 F (36.7 C) 98 F (36.7 C)  TempSrc: Oral Oral Oral Oral  SpO2: 99% 99% 98% 97%  Weight:      Height:        General: Pt lying comfortably in bed & appears in no obvious distress.  Oral mucosa moist. Cardiovascular: S1 & S2 heard, RRR, S1/S2 +. No murmurs, rubs, gallops or clicks. No JVD or pedal edema.  Telemetry personally reviewed: Sinus rhythm. Respiratory: Clear to auscultation without wheezing, rhonchi or crackles. No increased work of breathing. Abdominal:  Non distended, non tender & soft. No organomegaly or masses appreciated. Normal bowel sounds heard. CNS: Alert and oriented. No focal deficits. Extremities: no edema, no cyanosis    The results of significant diagnostics from this hospitalization (including imaging, microbiology, ancillary and laboratory) are listed below for reference.     Microbiology: Recent Results (from the past 240 hour(s))  Body fluid culture w Gram Stain     Status: None   Collection Time: 06/17/22 12:30 PM   Specimen: PATH Cytology Pleural fluid  Result Value Ref Range Status   Specimen Description   Final    PLEURAL Performed at [facility] [address], [place], [place] [address]    Special Requests   Final    NONE Performed at [facility], [place] [address] [place], [place] [address]    Gram Stain   Final    ABUNDANT WBC PRESENT, PREDOMINANTLY MONONUCLEAR NO ORGANISMS SEEN    Culture   Final    NO  GROWTH 3 DAYS Performed at [facility] [address] [place], [place] [address]    Report Status 06/20/2022 FINAL  Final  Urine Culture     Status: None   Collection Time: 06/22/22 10:01 AM   Specimen: Urine, Catheterized  Result Value Ref Range Status   Specimen Description   Final    URINE, CATHETERIZED Performed at [facility], [place] [address], [place], [place] [address]    Special Requests   Final    Immunocompromised Performed at [facility], [place] [address] [place], [place] [address]    Culture   Final    NO GROWTH Performed at [facility], [place] [address] [place],  [address]    Report Status 06/23/2022 FINAL  Final     Labs: CBC: Recent Labs  Lab 06/21/22 0557 06/22/22 0447 06/23/22 0615 06/24/22 0550 06/25/22 0500  WBC 7.7 8.6 4.8 4.3 4.7  NEUTROABS 5.5 6.7 3.6 2.7 3.0  HGB 10.4* 9.9* 8.8* 9.0* 9.5*  HCT 32.8* 30.8* 27.7* 28.2* 29.0*  MCV 86.3 86.0 86.0 86.0 85.3  PLT 157 146* 122* 119* 114*    Basic Metabolic Panel: Recent Labs  Lab 06/23/22 0615 06/24/22 0550 06/25/22 0500 06/25/22 1524 06/26/22 0539  NA 141 143 143 142 145  K 2.9* 3.1* 3.4* 3.8 3.4*  CL 119* 119* 120* 119* 121*  CO2 21* 19* 19* 20* 20*  GLUCOSE 109* 110* 100* 111* 104*  BUN 24* $Remov'17 14 12 11  'NlFqjd$ CREATININE 2.59* 2.65* 2.73* 2.64* 2.68*  CALCIUM 7.8* 8.0* 8.1* 7.8* 7.8*    Liver Function Tests: Recent Labs  Lab 06/21/22 0557 06/22/22 0447 06/23/22 0615 06/24/22 0550 06/25/22 0500  AST 40 54* 45* 45* 50*  ALT 32 45* 40 41 45*  ALKPHOS 80 73 69 73 78  BILITOT 0.8 0.6 0.3 0.5 0.5  PROT 6.7 6.6 5.7* 5.8* 6.0*  ALBUMIN 3.5 3.3* 3.0* 3.0* 3.1*    CBG: Recent Labs  Lab 06/21/22 1609  GLUCAP 139*    Urinalysis    Component Value Date/Time   COLORURINE YELLOW 06/22/2022 1001   APPEARANCEUR TURBID (A) 06/22/2022 1001   LABSPEC 1.017 06/22/2022 1001   PHURINE 5.0 06/22/2022 1001   GLUCOSEU >=500 (A)  06/22/2022 1001   HGBUR MODERATE (A) 06/22/2022 1001   BILIRUBINUR NEGATIVE 06/22/2022 1001   KETONESUR 5 (A) 06/22/2022 1001   PROTEINUR 100 (A) 06/22/2022 1001   UROBILINOGEN 0.2 05/01/2010 0830   NITRITE NEGATIVE 06/22/2022 1001   LEUKOCYTESUR NEGATIVE 06/22/2022 1001   Discussed in detail with patient's daughter at bedside, updated care and answered all questions.   Time coordinating discharge: 35 minutes  SIGNED:  Vernell Leep, MD,  FACP, [facility], [facility] (Care Management Physician Certified). [facility]  To contact the attending provider between 7A-7P or the covering provider during after hours 7P-7A, please log into the web site [email or web address] and access using universal [place] password for that web site. If you do not have the password, please call the hospital operator.

## 2022-06-28 ENCOUNTER — Encounter: Payer: Self-pay | Admitting: *Deleted

## 2022-06-28 ENCOUNTER — Other Ambulatory Visit: Payer: Self-pay | Admitting: Hematology & Oncology

## 2022-06-28 DIAGNOSIS — C9000 Multiple myeloma not having achieved remission: Secondary | ICD-10-CM

## 2022-06-28 NOTE — Progress Notes (Signed)
Patient has been discharged from the hospital. Dr Marin Olp would like to see her next week for follow up and to continue treatment.   Oncology Nurse Navigator Documentation     06/28/2022    1:00 PM  Oncology Nurse Navigator Flowsheets  Navigator Follow Up Date: 07/04/2022  Navigator Follow Up Reason: Follow-up Appointment;Chemotherapy  Navigator Location [facility]  Navigator Encounter Type Appt/Treatment Plan Review  Patient Visit Type MedOnc  Treatment Phase Active Tx  Barriers/Navigation Needs Coordination of Care;Education  Interventions None Required  Acuity Level 2-Minimal Needs (1-2 Barriers Identified)  Support Groups/Services Friends and Family  Time Spent with Patient 15

## 2022-06-28 NOTE — Progress Notes (Unsigned)
Pharmacist Chemotherapy Monitoring - Initial Assessment    Anticipated start date: 07/04/22   The following has been reviewed per standard work regarding the patient's treatment regimen: The patient's diagnosis, treatment plan and drug doses, and organ/hematologic function Lab orders and baseline tests specific to treatment regimen  The treatment plan start date, drug sequencing, and pre-medications Prior authorization status  Patient's documented medication list, including drug-drug interaction screen and prescriptions for anti-emetics and supportive care specific to the treatment regimen The drug concentrations, fluid compatibility, administration routes, and timing of the medications to be used The patient's access for treatment and lifetime cumulative dose history, if applicable  The patient's medication allergies and previous infusion related reactions, if applicable   Changes made to treatment plan:  treatment plan date  Follow up needed:  Pending authorization for treatment  and rbc phenotype and cross and type prior to first Faspro.   Claybon Jabs, [place], 06/28/2022  1:38 PM

## 2022-07-03 ENCOUNTER — Other Ambulatory Visit: Payer: Self-pay

## 2022-07-03 ENCOUNTER — Other Ambulatory Visit: Payer: Self-pay | Admitting: Hematology & Oncology

## 2022-07-03 DIAGNOSIS — C9000 Multiple myeloma not having achieved remission: Secondary | ICD-10-CM

## 2022-07-04 ENCOUNTER — Inpatient Hospital Stay (HOSPITAL_BASED_OUTPATIENT_CLINIC_OR_DEPARTMENT_OTHER): Payer: BC Managed Care – PPO | Admitting: Hematology & Oncology

## 2022-07-04 ENCOUNTER — Inpatient Hospital Stay: Payer: BC Managed Care – PPO

## 2022-07-04 ENCOUNTER — Inpatient Hospital Stay: Payer: BC Managed Care – PPO | Attending: Hematology & Oncology

## 2022-07-04 ENCOUNTER — Encounter: Payer: Self-pay | Admitting: Hematology & Oncology

## 2022-07-04 ENCOUNTER — Encounter: Payer: Self-pay | Admitting: *Deleted

## 2022-07-04 VITALS — BP 165/93 | HR 107 | Temp 97.7°F | Resp 20 | Ht 64.0 in

## 2022-07-04 VITALS — BP 135/72 | HR 101 | Temp 98.6°F | Resp 19

## 2022-07-04 DIAGNOSIS — R600 Localized edema: Secondary | ICD-10-CM | POA: Insufficient documentation

## 2022-07-04 DIAGNOSIS — D649 Anemia, unspecified: Secondary | ICD-10-CM | POA: Insufficient documentation

## 2022-07-04 DIAGNOSIS — N289 Disorder of kidney and ureter, unspecified: Secondary | ICD-10-CM | POA: Diagnosis not present

## 2022-07-04 DIAGNOSIS — Z5112 Encounter for antineoplastic immunotherapy: Secondary | ICD-10-CM | POA: Insufficient documentation

## 2022-07-04 DIAGNOSIS — C9000 Multiple myeloma not having achieved remission: Secondary | ICD-10-CM

## 2022-07-04 DIAGNOSIS — Z5111 Encounter for antineoplastic chemotherapy: Secondary | ICD-10-CM | POA: Insufficient documentation

## 2022-07-04 LAB — CBC WITH DIFFERENTIAL (CANCER CENTER ONLY)
Abs Immature Granulocytes: 0.06 10*3/uL (ref 0.00–0.07)
Basophils Absolute: 0 10*3/uL (ref 0.0–0.1)
Basophils Relative: 1 %
Eosinophils Absolute: 0 10*3/uL (ref 0.0–0.5)
Eosinophils Relative: 1 %
HCT: 27.8 % — ABNORMAL LOW (ref 36.0–46.0)
Hemoglobin: 9.1 g/dL — ABNORMAL LOW (ref 12.0–15.0)
Immature Granulocytes: 2 %
Lymphocytes Relative: 10 %
Lymphs Abs: 0.3 10*3/uL — ABNORMAL LOW (ref 0.7–4.0)
MCH: 27.6 pg (ref 26.0–34.0)
MCHC: 32.7 g/dL (ref 30.0–36.0)
MCV: 84.2 fL (ref 80.0–100.0)
Monocytes Absolute: 1 10*3/uL (ref 0.1–1.0)
Monocytes Relative: 31 %
Neutro Abs: 1.7 10*3/uL (ref 1.7–7.7)
Neutrophils Relative %: 55 %
Platelet Count: 120 10*3/uL — ABNORMAL LOW (ref 150–400)
RBC: 3.3 MIL/uL — ABNORMAL LOW (ref 3.87–5.11)
RDW: 17.5 % — ABNORMAL HIGH (ref 11.5–15.5)
WBC Count: 3 10*3/uL — ABNORMAL LOW (ref 4.0–10.5)
nRBC: 0 % (ref 0.0–0.2)

## 2022-07-04 LAB — CMP (CANCER CENTER ONLY)
ALT: 21 U/L (ref 0–44)
AST: 27 U/L (ref 15–41)
Albumin: 3.9 g/dL (ref 3.5–5.0)
Alkaline Phosphatase: 116 U/L (ref 38–126)
Anion gap: 10 (ref 5–15)
BUN: 6 mg/dL — ABNORMAL LOW (ref 8–23)
CO2: 24 mmol/L (ref 22–32)
Calcium: 9.3 mg/dL (ref 8.9–10.3)
Chloride: 107 mmol/L (ref 98–111)
Creatinine: 2.06 mg/dL — ABNORMAL HIGH (ref 0.44–1.00)
GFR, Estimated: 27 mL/min — ABNORMAL LOW (ref >60)
Glucose, Bld: 116 mg/dL — ABNORMAL HIGH (ref 70–99)
Potassium: 3 mmol/L — ABNORMAL LOW (ref 3.5–5.1)
Sodium: 141 mmol/L (ref 135–145)
Total Bilirubin: 0.6 mg/dL (ref 0.3–1.2)
Total Protein: 6.9 g/dL (ref 6.5–8.1)

## 2022-07-04 LAB — PREPARE RBC (CROSSMATCH)

## 2022-07-04 MED ORDER — HEPARIN SOD (PORK) LOCK FLUSH 100 UNIT/ML IV SOLN: 500.0000 [IU] | Freq: Every day | INTRAVENOUS | Status: DC | PRN

## 2022-07-04 MED ORDER — HEPARIN SOD (PORK) LOCK FLUSH 100 UNIT/ML IV SOLN
500.0000 [IU] | Freq: Once | INTRAVENOUS | Status: AC | PRN
  Administered 2022-07-04: 500 [IU]

## 2022-07-04 MED ORDER — OXYCODONE HCL 10 MG PO TABS: 10.0000 mg | ORAL_TABLET | ORAL | 0 refills | Status: DC | PRN

## 2022-07-04 MED ORDER — SODIUM CHLORIDE 0.9% IV SOLUTION: 250.0000 mL | Freq: Once | INTRAVENOUS | Status: DC

## 2022-07-04 MED ORDER — DIPHENHYDRAMINE HCL 25 MG PO CAPS
50.0000 mg | ORAL_CAPSULE | Freq: Once | ORAL | Status: AC
  Administered 2022-07-04: 50 mg via ORAL
  Filled 2022-07-04 (×2): qty 2

## 2022-07-04 MED ORDER — BORTEZOMIB CHEMO SQ INJECTION 3.5 MG (2.5MG/ML)
1.3000 mg/m2 | Freq: Once | INTRAMUSCULAR | Status: AC
  Administered 2022-07-04: 2.75 mg via SUBCUTANEOUS
  Filled 2022-07-04: qty 1.1

## 2022-07-04 MED ORDER — FUROSEMIDE 10 MG/ML IJ SOLN: 40.0000 mg | Freq: Once | INTRAMUSCULAR | Status: DC

## 2022-07-04 MED ORDER — MONTELUKAST SODIUM 10 MG PO TABS
10.0000 mg | ORAL_TABLET | Freq: Once | ORAL | Status: AC
  Administered 2022-07-04: 10 mg via ORAL
  Filled 2022-07-04: qty 1

## 2022-07-04 MED ORDER — SODIUM CHLORIDE 0.9 % IV SOLN: Freq: Once | INTRAVENOUS | Status: AC

## 2022-07-04 MED ORDER — DARATUMUMAB-HYALURONIDASE-FIHJ 1800-30000 MG-UT/15ML ~~LOC~~ SOLN
1800.0000 mg | Freq: Once | SUBCUTANEOUS | Status: AC
  Administered 2022-07-04: 1800 mg via SUBCUTANEOUS
  Filled 2022-07-04: qty 15

## 2022-07-04 MED ORDER — ACETAMINOPHEN 325 MG PO TABS
650.0000 mg | ORAL_TABLET | Freq: Once | ORAL | Status: AC
  Administered 2022-07-04: 650 mg via ORAL
  Filled 2022-07-04: qty 2

## 2022-07-04 MED ORDER — SODIUM CHLORIDE 0.9% FLUSH
10.0000 mL | INTRAVENOUS | Status: DC | PRN
  Administered 2022-07-04: 10 mL

## 2022-07-04 MED ORDER — SODIUM CHLORIDE 0.9 % IV SOLN
400.0000 mg | Freq: Once | INTRAVENOUS | Status: AC
  Administered 2022-07-04: 400 mg via INTRAVENOUS
  Filled 2022-07-04: qty 20

## 2022-07-04 MED ORDER — HEPARIN SOD (PORK) LOCK FLUSH 100 UNIT/ML IV SOLN: 250.0000 [IU] | INTRAVENOUS | Status: DC | PRN

## 2022-07-04 MED ORDER — SODIUM CHLORIDE 0.9 % IV SOLN
20.0000 mg | Freq: Once | INTRAVENOUS | Status: AC
  Administered 2022-07-04: 20 mg via INTRAVENOUS
  Filled 2022-07-04: qty 20

## 2022-07-04 NOTE — Progress Notes (Signed)
OK to treat with creatinine value from today per Dr. Marin Olp.

## 2022-07-04 NOTE — Patient Instructions (Signed)
[facility]  Discharge Instructions: Thank you for choosing [facility] to provide your oncology and hematology care.   If you have a lab appointment with the [facility], please go directly to the [facility] and check in at the registration area.  Wear comfortable clothing and clothing appropriate for easy access to any Portacath or PICC line.   We strive to give you quality time with your provider. You may need to reschedule your appointment if you arrive late (15 or more minutes).  Arriving late affects you and other patients whose appointments are after yours.  Also, if you miss three or more appointments without notifying the office, you may be dismissed from the clinic at the provider's discretion.      For prescription refill requests, have your pharmacy contact our office and allow 72 hours for refills to be completed.    Today you received the following chemotherapy and/or immunotherapy agents Velcade, Cytoxan and Darzalex Faspro      To help prevent nausea and vomiting after your treatment, we encourage you to take your nausea medication as directed.  BELOW ARE SYMPTOMS THAT SHOULD BE REPORTED IMMEDIATELY: *FEVER GREATER THAN 100.4 F (38 C) OR HIGHER *CHILLS OR SWEATING *NAUSEA AND VOMITING THAT IS NOT CONTROLLED WITH YOUR NAUSEA MEDICATION *UNUSUAL SHORTNESS OF BREATH *UNUSUAL BRUISING OR BLEEDING *URINARY PROBLEMS (pain or burning when urinating, or frequent urination) *BOWEL PROBLEMS (unusual diarrhea, constipation, pain near the anus) TENDERNESS IN MOUTH AND THROAT WITH OR WITHOUT PRESENCE OF ULCERS (sore throat, sores in mouth, or a toothache) UNUSUAL RASH, SWELLING OR PAIN  UNUSUAL VAGINAL DISCHARGE OR ITCHING   Items with * indicate a potential emergency and should be followed up as soon as possible or go to the Emergency Department if any problems should occur.  Please show the CHEMOTHERAPY ALERT CARD or IMMUNOTHERAPY  ALERT CARD at check-in to the Emergency Department and triage nurse. Should you have questions after your visit or need to cancel or reschedule your appointment, please contact [facility]  [phone] and follow the prompts.  Office hours are 8:00 a.m. to 4:30 p.m. Monday - Friday. Please note that voicemails left after 4:00 p.m. may not be returned until the following business day.  We are closed weekends and major holidays. You have access to a nurse at all times for urgent questions. Please call the main number to the clinic [phone] and follow the prompts.  For any non-urgent questions, you may also contact your provider using MyChart. We now offer e-Visits for anyone 59 and older to request care online for non-urgent symptoms. For details visit [email or web address].   Also download the MyChart app! Go to the app store, search "MyChart", open the app, select [facility], and log in with your MyChart username and password.  Masks are optional in the cancer centers. If you would like for your care team to wear a mask while they are taking care of you, please let them know. You may have one support person who is at least 63 years old accompany you for your appointments.

## 2022-07-04 NOTE — Progress Notes (Signed)
Hematology and Oncology Follow Up Visit  Michelle [name] [id number] [birth year] 61 y.o. 07/04/2022   Principle Diagnosis:  IgG Kappa myeloma -- normal cytogenetics  Current Therapy:   Faspro//Cytoxan/Velcade/Decadron -- start cycle #1 on 06/20/2022 XRT to lumbar spine -- completed on 06/26/2022 Xgeva 120 mg sq q 3 months     Interim History:  Michelle [name] is back for second office visit.  We actually saw her back in June.  At that time, she had extensive disease in her spine.  She had to be admitted because of severe pain.  We ultimately did her work-up in the hospital.  She had a bone marrow biopsy done that clearly showed that she had IgG kappa myeloma.  Her cytogenetics were normal.  I do not have back the [facility] panel yet.  We had to give her radiation therapy in the hospital.  This was through the lumbar spine.  This did seem to help her.  She has renal sufficiency.  Again this is from the light chains.  When we initially checked her light chains, she had quite a bit of Kappa light chain in her blood.  I think the Kappa light chain level was 1154 mg/L.  When we checked her IgG level it was 2500 mg/dL.  Her M spike was 1.8 g/dL.  Again she was in the hospital for about 2 weeks.  We had to get her pain medications all sorted out.  She cannot tolerate fentanyl.  It caused too much in the way of confusion.  We did start chemotherapy in the hospital.  She was started on Velcade and Decadron.  She also got Cytoxan.  We cannot use Revlimid because of her renal insufficiency.  We will now add Faspro.  Our goal is still to get her to transplant.  I think she is by going need to have a transfusion.  Her legs are quite swollen.  I think a lot of this is from her anemia.  She is eating a little bit better.  She is not having any problems with nausea or vomiting.  She is having no problems with diarrhea or constipation.  She is urinating well.  Currently, I would have said that her performance  status is probably ECOG 2.    Medications:  Current Outpatient Medications:    Coenzyme Q10 (COQ10) 100 MG CAPS, Take 1 capsule by mouth daily., Disp: , Rfl:    fluconazole (DIFLUCAN) 100 MG tablet, Take 1 tablet (100 mg total) by mouth daily., Disp: 30 tablet, Rfl: 4   levothyroxine (SYNTHROID) 88 MCG tablet, 1 TABLET EVERY MORNING ON AN EMPTY STOMACH ORALLY ONCE A DAY 90 DAYS, Disp: , Rfl:    metoprolol tartrate (LOPRESSOR) 25 MG tablet, Take 1 tablet (25 mg total) by mouth 2 (two) times daily., Disp: 60 tablet, Rfl: 1   oxyCODONE (OXY IR/ROXICODONE) 5 MG immediate release tablet, Take 1 tablet (5 mg total) by mouth every 6 (six) hours as needed for severe pain or moderate pain., Disp: 30 tablet, Rfl: 0   pantoprazole (PROTONIX) 40 MG tablet, Take 1 tablet (40 mg total) by mouth 2 (two) times daily., Disp: 60 tablet, Rfl: 1   rosuvastatin (CRESTOR) 10 MG tablet, TAKE 1 TABLET BY MOUTH EVERY DAY FOR 90 DAYS, Disp: , Rfl:    cholecalciferol (VITAMIN D3) 25 MCG (1000 UNIT) tablet, Take 1,000 Units by mouth daily. (Patient not taking: Reported on 07/04/2022), Disp: , Rfl:    famciclovir (FAMVIR) 500 MG tablet, Take 1  tablet (500 mg total) by mouth daily. (Patient not taking: Reported on 07/04/2022), Disp: 10 tablet, Rfl: 0   lidocaine (LIDODERM) 5 %, Place 2 patches onto the skin daily. Remove & Discard patch within 12 hours or as directed by MD (Patient not taking: Reported on 07/04/2022), Disp: 30 patch, Rfl: 0   Multiple Vitamin (MULTIVITAMIN WITH MINERALS) TABS tablet, Take 1 tablet by mouth daily. (Patient not taking: Reported on 07/04/2022), Disp: , Rfl:    prochlorperazine (COMPAZINE) 10 MG tablet, Take 1 tablet (10 mg total) by mouth every 6 (six) hours as needed for nausea or vomiting. (Patient not taking: Reported on 07/04/2022), Disp: 30 tablet, Rfl: 5   protein supplement (RESOURCE BENEPROTEIN) 6 g POWD, Take 1 Scoop (6 g total) by mouth 3 (three) times daily with meals. (Patient not taking:  Reported on 07/04/2022), Disp: , Rfl:    senna (SENOKOT) 8.6 MG TABS tablet, Take 1 tablet (8.6 mg total) by mouth daily as needed for mild constipation or moderate constipation. (Patient not taking: Reported on 07/04/2022), Disp: 30 tablet, Rfl: 0 No current facility-administered medications for this visit.  Facility-Administered Medications Ordered in Other Visits:    0.9 %  sodium chloride infusion (Manually program via Guardrails IV Fluids), 250 mL, Intravenous, Once, Junius Faucett, Rudell Cobb, MD   furosemide (LASIX) injection 40 mg, 40 mg, Intravenous, Once, Virginie Josten, Rudell Cobb, MD   heparin lock flush 100 unit/mL, 500 Units, Intracatheter, Daily PRN, Volanda Napoleon, MD   heparin lock flush 100 unit/mL, 250 Units, Intracatheter, PRN, Marin Olp, Rudell Cobb, MD   sodium chloride flush (NS) 0.9 % injection 10 mL, 10 mL, Intracatheter, PRN, Volanda Napoleon, MD, 10 mL at 07/04/22 1426  Allergies:  Allergies  Allergen Reactions   Penicillins Other (See Comments)    Severe Yeast infection... No deathly reactions   Codeine Nausea Only   Morphine Other (See Comments)    headache   Tramadol     headache    Past Medical History, Surgical history, Social history, and Family History were reviewed and updated.  Review of Systems: Review of Systems  Constitutional:  Positive for fatigue.  HENT:  Negative.    Eyes: Negative.   Respiratory: Negative.    Cardiovascular:  Positive for leg swelling.  Gastrointestinal: Negative.   Endocrine: Negative.   Genitourinary: Negative.    Musculoskeletal:  Positive for arthralgias, back pain and myalgias.  Skin: Negative.   Neurological: Negative.   Hematological: Negative.   Psychiatric/Behavioral: Negative.      Physical Exam:  height is _0  (1.626 m) and weight is 205 lb 9.6 oz (93.3 kg) (pended). Her oral temperature is 97.7 F (36.5 C). Her blood pressure is 165/93 (abnormal) and her pulse is 107 (abnormal). Her respiration is 20 and oxygen saturation is  97%.   Wt Readings from Last 3 Encounters:  07/04/22 (P) 205 lb 9.6 oz (93.3 kg)  06/25/22 212 lb (96.2 kg)  06/11/22 202 lb (91.6 kg)    Physical Exam Vitals reviewed.  HENT:     Head: Normocephalic and atraumatic.  Eyes:     Pupils: Pupils are equal, round, and reactive to light.  Cardiovascular:     Rate and Rhythm: Normal rate and regular rhythm.     Heart sounds: Normal heart sounds.  Pulmonary:     Effort: Pulmonary effort is normal.     Breath sounds: Normal breath sounds.  Abdominal:     General: Bowel sounds are normal.  Palpations: Abdomen is soft.  Musculoskeletal:        General: No tenderness or deformity. Normal range of motion.     Cervical back: Normal range of motion.     Comments: Her lower extremities does show some swelling bilateral.  She probably has 2+ edema bilaterally in her legs.  Lymphadenopathy:     Cervical: No cervical adenopathy.  Skin:    General: Skin is warm and dry.     Findings: No erythema or rash.  Neurological:     Mental Status: She is alert and oriented to person, place, and time.  Psychiatric:        Behavior: Behavior normal.        Thought Content: Thought content normal.        Judgment: Judgment normal.      Lab Results  Component Value Date   WBC 3.0 (L) 07/04/2022   HGB 9.1 (L) 07/04/2022   HCT 27.8 (L) 07/04/2022   MCV 84.2 07/04/2022   PLT 120 (L) 07/04/2022     Chemistry      Component Value Date/Time   NA 141 07/04/2022 0806   K 3.0 (L) 07/04/2022 0806   CL 107 07/04/2022 0806   CO2 24 07/04/2022 0806   BUN 6 (L) 07/04/2022 0806   CREATININE 2.06 (H) 07/04/2022 0806      Component Value Date/Time   CALCIUM 9.3 07/04/2022 0806   ALKPHOS 116 07/04/2022 0806   AST 27 07/04/2022 0806   ALT 21 07/04/2022 0806   BILITOT 0.6 07/04/2022 0806      Impression and Plan: Ms. Goding is a very nice African-American female.  She has IgG kappa myeloma.  The problem clearly is with the light chains.  I think  this is what was causing her renal insufficiency.  Her renal function is a little bit better.  We will now have her on full dose treatment.  We will have her on Faspro along with the Cytoxan/Velcade.  We should be able to see a good response.  I think that it is probably going to take several months before her back finally does get better.  She had the radiation.  We just need to allow the chemotherapy to be able to help get rid of the myelomatous lesions in her spine.  Hopefully, the blood that she gets will help with some of the edema in her legs.  I think that if her albumin improves, then this will definitely help with her edema.  We have a long ways to go.  Hopefully, if all goes well, we might be able to get her to stem cell transplant sometime in the winter.  I would like to see her back in about a month.  At that point, we will start her second full cycle of treatment.     Volanda Napoleon, MD 8/31/20234:53 PM

## 2022-07-04 NOTE — Progress Notes (Signed)
Patient presents to the office for cycle two of treatment after an extended hospitalization where she received her first cycle. She completed radiation.   She still c/o pain, although she states its more tolerable. She is trying to stay active. She will get transfused one until today as well.   Oncology Nurse Navigator Documentation     07/04/2022    8:30 AM  Oncology Nurse Navigator Flowsheets  Phase of Treatment Radiation  Radiation Actual End Date: 06/26/2022  Navigator Follow Up Date: 08/02/2022  Navigator Follow Up Reason: Follow-up Appointment;Chemotherapy  Navigator Location [facility]  Navigator Encounter Type Treatment;Appt/Treatment Plan Review  Patient Visit Type MedOnc  Treatment Phase Active Tx  Barriers/Navigation Needs Coordination of Care;Education  Education Pain/ Symptom Management  Interventions Education;Psycho-Social Support  Acuity Level 2-Minimal Needs (1-2 Barriers Identified)  Support Groups/Services Friends and Family  Time Spent with Patient 30

## 2022-07-04 NOTE — Patient Instructions (Signed)

## 2022-07-05 ENCOUNTER — Encounter: Payer: Self-pay | Admitting: Hematology & Oncology

## 2022-07-05 ENCOUNTER — Encounter (HOSPITAL_COMMUNITY): Payer: Self-pay | Admitting: Hematology & Oncology

## 2022-07-05 ENCOUNTER — Telehealth: Payer: Self-pay | Admitting: *Deleted

## 2022-07-05 LAB — TYPE AND SCREEN
ABO/RH(D): A POS
Antibody Screen: NEGATIVE
Unit division: 0

## 2022-07-05 LAB — BPAM RBC
Blood Product Expiration Date: 202309242359
ISSUE DATE / TIME: 202308311123
Unit Type and Rh: 6200

## 2022-07-05 NOTE — Telephone Encounter (Signed)
Message received from patient wanting to know if Dr. Marin Olp increased her pain medicine and if she should continue to wear her back brace prescribed by Dr. Lynann Bologna.  Call placed back to patient and patient notified that Dr. Marin Olp did increase the Oxycodone to 10 mg every four hours yesterday and that it is ok to continue to wear the back brace that Dr. Lynann Bologna prescribed for her.  Pt is appreciative of call back and has no further questions at this time.

## 2022-07-11 ENCOUNTER — Inpatient Hospital Stay: Payer: BC Managed Care – PPO

## 2022-07-11 ENCOUNTER — Inpatient Hospital Stay: Payer: BC Managed Care – PPO | Attending: Hematology & Oncology

## 2022-07-11 VITALS — BP 138/81 | HR 92 | Temp 98.7°F | Resp 19

## 2022-07-11 DIAGNOSIS — D631 Anemia in chronic kidney disease: Secondary | ICD-10-CM | POA: Diagnosis not present

## 2022-07-11 DIAGNOSIS — N1832 Chronic kidney disease, stage 3b: Secondary | ICD-10-CM | POA: Insufficient documentation

## 2022-07-11 DIAGNOSIS — Z5112 Encounter for antineoplastic immunotherapy: Secondary | ICD-10-CM | POA: Diagnosis not present

## 2022-07-11 DIAGNOSIS — Z5111 Encounter for antineoplastic chemotherapy: Secondary | ICD-10-CM | POA: Diagnosis present

## 2022-07-11 DIAGNOSIS — C9000 Multiple myeloma not having achieved remission: Secondary | ICD-10-CM | POA: Diagnosis not present

## 2022-07-11 LAB — CMP (CANCER CENTER ONLY)
ALT: 14 U/L (ref 0–44)
AST: 20 U/L (ref 15–41)
Albumin: 4 g/dL (ref 3.5–5.0)
Alkaline Phosphatase: 153 U/L — ABNORMAL HIGH (ref 38–126)
Anion gap: 8 (ref 5–15)
BUN: 8 mg/dL (ref 8–23)
CO2: 28 mmol/L (ref 22–32)
Calcium: 10 mg/dL (ref 8.9–10.3)
Chloride: 106 mmol/L (ref 98–111)
Creatinine: 2.26 mg/dL — ABNORMAL HIGH (ref 0.44–1.00)
GFR, Estimated: 24 mL/min — ABNORMAL LOW (ref >60)
Glucose, Bld: 118 mg/dL — ABNORMAL HIGH (ref 70–99)
Potassium: 2.9 mmol/L — ABNORMAL LOW (ref 3.5–5.1)
Sodium: 142 mmol/L (ref 135–145)
Total Bilirubin: 0.5 mg/dL (ref 0.3–1.2)
Total Protein: 6.7 g/dL (ref 6.5–8.1)

## 2022-07-11 LAB — CBC WITH DIFFERENTIAL (CANCER CENTER ONLY)
Abs Immature Granulocytes: 0.02 10*3/uL (ref 0.00–0.07)
Basophils Absolute: 0 10*3/uL (ref 0.0–0.1)
Basophils Relative: 1 %
Eosinophils Absolute: 0.1 10*3/uL (ref 0.0–0.5)
Eosinophils Relative: 2 %
HCT: 30.9 % — ABNORMAL LOW (ref 36.0–46.0)
Hemoglobin: 10 g/dL — ABNORMAL LOW (ref 12.0–15.0)
Immature Granulocytes: 1 %
Lymphocytes Relative: 5 %
Lymphs Abs: 0.2 10*3/uL — ABNORMAL LOW (ref 0.7–4.0)
MCH: 27.5 pg (ref 26.0–34.0)
MCHC: 32.4 g/dL (ref 30.0–36.0)
MCV: 85.1 fL (ref 80.0–100.0)
Monocytes Absolute: 1.5 10*3/uL — ABNORMAL HIGH (ref 0.1–1.0)
Monocytes Relative: 39 %
Neutro Abs: 2.1 10*3/uL (ref 1.7–7.7)
Neutrophils Relative %: 52 %
Platelet Count: 165 10*3/uL (ref 150–400)
RBC: 3.63 MIL/uL — ABNORMAL LOW (ref 3.87–5.11)
RDW: 17.1 % — ABNORMAL HIGH (ref 11.5–15.5)
WBC Count: 3.9 10*3/uL — ABNORMAL LOW (ref 4.0–10.5)
nRBC: 0 % (ref 0.0–0.2)

## 2022-07-11 MED ORDER — ACETAMINOPHEN 325 MG PO TABS
650.0000 mg | ORAL_TABLET | Freq: Once | ORAL | Status: AC
  Administered 2022-07-11: 650 mg via ORAL
  Filled 2022-07-11: qty 2

## 2022-07-11 MED ORDER — DIPHENHYDRAMINE HCL 25 MG PO CAPS
50.0000 mg | ORAL_CAPSULE | Freq: Once | ORAL | Status: AC
  Administered 2022-07-11: 50 mg via ORAL
  Filled 2022-07-11: qty 2

## 2022-07-11 MED ORDER — DARATUMUMAB-HYALURONIDASE-FIHJ 1800-30000 MG-UT/15ML ~~LOC~~ SOLN
1800.0000 mg | Freq: Once | SUBCUTANEOUS | Status: AC
  Administered 2022-07-11: 1800 mg via SUBCUTANEOUS
  Filled 2022-07-11: qty 15

## 2022-07-11 MED ORDER — POTASSIUM CHLORIDE CRYS ER 20 MEQ PO TBCR
40.0000 meq | EXTENDED_RELEASE_TABLET | Freq: Once | ORAL | Status: AC
  Administered 2022-07-11: 40 meq via ORAL

## 2022-07-11 MED ORDER — HEPARIN SOD (PORK) LOCK FLUSH 100 UNIT/ML IV SOLN
500.0000 [IU] | Freq: Once | INTRAVENOUS | Status: AC | PRN
  Administered 2022-07-11: 500 [IU]

## 2022-07-11 MED ORDER — SODIUM CHLORIDE 0.9 % IV SOLN: Freq: Once | INTRAVENOUS | Status: AC

## 2022-07-11 MED ORDER — SODIUM CHLORIDE 0.9% FLUSH
10.0000 mL | INTRAVENOUS | Status: DC | PRN
  Administered 2022-07-11: 10 mL

## 2022-07-11 MED ORDER — BORTEZOMIB CHEMO SQ INJECTION 3.5 MG (2.5MG/ML)
1.3000 mg/m2 | Freq: Once | INTRAMUSCULAR | Status: AC
  Administered 2022-07-11: 2.75 mg via SUBCUTANEOUS
  Filled 2022-07-11: qty 1.1

## 2022-07-11 MED ORDER — SODIUM CHLORIDE 0.9 % IV SOLN
20.0000 mg | Freq: Once | INTRAVENOUS | Status: AC
  Administered 2022-07-11: 20 mg via INTRAVENOUS
  Filled 2022-07-11: qty 20

## 2022-07-11 MED ORDER — MONTELUKAST SODIUM 10 MG PO TABS
10.0000 mg | ORAL_TABLET | Freq: Once | ORAL | Status: AC
  Administered 2022-07-11: 10 mg via ORAL
  Filled 2022-07-11: qty 1

## 2022-07-11 NOTE — Progress Notes (Signed)
Ok to treat with creatinine of 2.26 & alk phos of 153. K+ to be given during today's office visit. dph

## 2022-07-11 NOTE — Patient Instructions (Signed)

## 2022-07-11 NOTE — Progress Notes (Signed)
Cytoxan is Q 2 weeks per MD.  Acquanetta Belling, RPH, BCPS, BCOP 07/11/2022 11:40 AM

## 2022-07-11 NOTE — Patient Instructions (Signed)
[facility]  Discharge Instructions: Thank you for choosing [facility] to provide your oncology and hematology care.   If you have a lab appointment with the Cancer Center, please go directly to the Cancer Center and check in at the registration area.  Wear comfortable clothing and clothing appropriate for easy access to any Portacath or PICC line.   We strive to give you quality time with your provider. You may need to reschedule your appointment if you arrive late (15 or more minutes).  Arriving late affects you and other patients whose appointments are after yours.  Also, if you miss three or more appointments without notifying the office, you may be dismissed from the clinic at the provider's discretion.      For prescription refill requests, have your pharmacy contact our office and allow 72 hours for refills to be completed.    Today you received the following chemotherapy and/or immunotherapy agents Velcade, Faspro      To help prevent nausea and vomiting after your treatment, we encourage you to take your nausea medication as directed.  BELOW ARE SYMPTOMS THAT SHOULD BE REPORTED IMMEDIATELY: *FEVER GREATER THAN 100.4 F (38 C) OR HIGHER *CHILLS OR SWEATING *NAUSEA AND VOMITING THAT IS NOT CONTROLLED WITH YOUR NAUSEA MEDICATION *UNUSUAL SHORTNESS OF BREATH *UNUSUAL BRUISING OR BLEEDING *URINARY PROBLEMS (pain or burning when urinating, or frequent urination) *BOWEL PROBLEMS (unusual diarrhea, constipation, pain near the anus) TENDERNESS IN MOUTH AND THROAT WITH OR WITHOUT PRESENCE OF ULCERS (sore throat, sores in mouth, or a toothache) UNUSUAL RASH, SWELLING OR PAIN  UNUSUAL VAGINAL DISCHARGE OR ITCHING   Items with * indicate a potential emergency and should be followed up as soon as possible or go to the Emergency Department if any problems should occur.  Please show the CHEMOTHERAPY ALERT CARD or IMMUNOTHERAPY ALERT CARD at check-in to  the Emergency Department and triage nurse. Should you have questions after your visit or need to cancel or reschedule your appointment, please contact [facility]  [phone] and follow the prompts.  Office hours are 8:00 a.m. to 4:30 p.m. Monday - Friday. Please note that voicemails left after 4:00 p.m. may not be returned until the following business day.  We are closed weekends and major holidays. You have access to a nurse at all times for urgent questions. Please call the main number to the clinic [phone] and follow the prompts.  For any non-urgent questions, you may also contact your provider using MyChart. We now offer e-Visits for anyone 18 and older to request care online for non-urgent symptoms. For details visit [email or web address].   Also download the MyChart app! Go to the app store, search "MyChart", open the app, select [facility], and log in with your MyChart username and password.  Masks are optional in the cancer centers. If you would like for your care team to wear a mask while they are taking care of you, please let them know. You may have one support person who is at least 63 years old accompany you for your appointments. 

## 2022-07-16 ENCOUNTER — Other Ambulatory Visit: Payer: Self-pay

## 2022-07-16 DIAGNOSIS — K5900 Constipation, unspecified: Secondary | ICD-10-CM

## 2022-07-16 MED ORDER — LACTULOSE 10 GM/15ML PO SOLN: 10.0000 g | Freq: Two times a day (BID) | ORAL | 0 refills | Status: DC

## 2022-07-16 NOTE — Telephone Encounter (Signed)
Pt called stating she has been constipated, no BM for 6-7 days. No abdominal pain, has tried prune juice and miralax. States she never picked up her sennakot that was prescribed in August. Discussed with MD, rx for lactulose sent to pharmacy and pt aware.

## 2022-07-17 ENCOUNTER — Emergency Department (HOSPITAL_COMMUNITY): Payer: BC Managed Care – PPO

## 2022-07-17 ENCOUNTER — Other Ambulatory Visit: Payer: Self-pay

## 2022-07-17 ENCOUNTER — Emergency Department (HOSPITAL_COMMUNITY)
Admission: EM | Admit: 2022-07-17 | Discharge: 2022-07-18 | Disposition: A | Discharge status: Home / Self Care | Payer: BC Managed Care – PPO | Attending: Emergency Medicine | Admitting: Emergency Medicine

## 2022-07-17 ENCOUNTER — Encounter (HOSPITAL_COMMUNITY): Payer: Self-pay | Admitting: Pharmacy Technician

## 2022-07-17 DIAGNOSIS — D649 Anemia, unspecified: Secondary | ICD-10-CM | POA: Diagnosis not present

## 2022-07-17 DIAGNOSIS — I10 Essential (primary) hypertension: Secondary | ICD-10-CM | POA: Diagnosis not present

## 2022-07-17 DIAGNOSIS — E876 Hypokalemia: Secondary | ICD-10-CM | POA: Insufficient documentation

## 2022-07-17 DIAGNOSIS — K5641 Fecal impaction: Secondary | ICD-10-CM | POA: Diagnosis not present

## 2022-07-17 DIAGNOSIS — K5900 Constipation, unspecified: Secondary | ICD-10-CM | POA: Insufficient documentation

## 2022-07-17 DIAGNOSIS — Z79899 Other long term (current) drug therapy: Secondary | ICD-10-CM | POA: Diagnosis not present

## 2022-07-17 LAB — LIPASE, BLOOD: Lipase: 24 U/L (ref 11–51)

## 2022-07-17 LAB — COMPREHENSIVE METABOLIC PANEL
ALT: 17 U/L (ref 0–44)
AST: 26 U/L (ref 15–41)
Albumin: 3.8 g/dL (ref 3.5–5.0)
Alkaline Phosphatase: 172 U/L — ABNORMAL HIGH (ref 38–126)
Anion gap: 8 (ref 5–15)
BUN: 9 mg/dL (ref 8–23)
CO2: 23 mmol/L (ref 22–32)
Calcium: 9.4 mg/dL (ref 8.9–10.3)
Chloride: 109 mmol/L (ref 98–111)
Creatinine, Ser: 2.21 mg/dL — ABNORMAL HIGH (ref 0.44–1.00)
GFR, Estimated: 24 mL/min — ABNORMAL LOW (ref >60)
Glucose, Bld: 106 mg/dL — ABNORMAL HIGH (ref 70–99)
Potassium: 3.3 mmol/L — ABNORMAL LOW (ref 3.5–5.1)
Sodium: 140 mmol/L (ref 135–145)
Total Bilirubin: 0.8 mg/dL (ref 0.3–1.2)
Total Protein: 6.8 g/dL (ref 6.5–8.1)

## 2022-07-17 LAB — CBC WITH DIFFERENTIAL/PLATELET
Abs Immature Granulocytes: 0.03 10*3/uL (ref 0.00–0.07)
Basophils Absolute: 0 10*3/uL (ref 0.0–0.1)
Basophils Relative: 0 %
Eosinophils Absolute: 0 10*3/uL (ref 0.0–0.5)
Eosinophils Relative: 1 %
HCT: 32.3 % — ABNORMAL LOW (ref 36.0–46.0)
Hemoglobin: 10.6 g/dL — ABNORMAL LOW (ref 12.0–15.0)
Immature Granulocytes: 0 %
Lymphocytes Relative: 4 %
Lymphs Abs: 0.3 10*3/uL — ABNORMAL LOW (ref 0.7–4.0)
MCH: 28.5 pg (ref 26.0–34.0)
MCHC: 32.8 g/dL (ref 30.0–36.0)
MCV: 86.8 fL (ref 80.0–100.0)
Monocytes Absolute: 1 10*3/uL (ref 0.1–1.0)
Monocytes Relative: 15 %
Neutro Abs: 5.4 10*3/uL (ref 1.7–7.7)
Neutrophils Relative %: 80 %
Platelets: 146 10*3/uL — ABNORMAL LOW (ref 150–400)
RBC: 3.72 MIL/uL — ABNORMAL LOW (ref 3.87–5.11)
RDW: 17.4 % — ABNORMAL HIGH (ref 11.5–15.5)
WBC: 6.9 10*3/uL (ref 4.0–10.5)
nRBC: 0 % (ref 0.0–0.2)

## 2022-07-17 MED ORDER — FLEET ENEMA 7-19 GM/118ML RE ENEM
1.0000 | ENEMA | Freq: Once | RECTAL | Status: AC
  Administered 2022-07-17: 1 via RECTAL
  Filled 2022-07-17: qty 1

## 2022-07-17 MED ORDER — SORBITOL 70 % SOLN
960.0000 mL | TOPICAL_OIL | Freq: Once | ORAL | Status: AC
  Administered 2022-07-17: 960 mL via RECTAL
  Filled 2022-07-17: qty 473

## 2022-07-17 NOTE — ED Notes (Signed)
Pt needs labs and IV or IV charted

## 2022-07-17 NOTE — ED Notes (Signed)
Patient requesting lab draw from [facility]. 

## 2022-07-17 NOTE — ED Provider Triage Note (Signed)
Emergency Medicine Provider Triage Evaluation Note  Michelle [name] , a 61 y.o. female  was evaluated in triage.  Pt complains of constipation. Report feeling constipated x 1 week.  Felt nauseous a few days ago. Felt impacted.  Currently on chemo and taking opioid medication.  No fever.  No vomiting.  Able to pass flatus. May need manual disimpaction  Review of Systems  Positive: As above Negative: As above  Physical Exam  BP (!) 153/86   Pulse (!) 111   Temp 99 F (37.2 C) (Oral)   Resp 20   SpO2 93%  Gen:   Awake, no distress   Resp:  Normal effort  MSK:   Moves extremities without difficulty  Other:    Medical Decision Making  Medically screening exam initiated at 7:04 PM.  Appropriate orders placed.  Michelle [name] was informed that the remainder of the evaluation will be completed by another provider, this initial triage assessment does not replace that evaluation, and the importance of remaining in the ED until their evaluation is complete.     Domenic Moras, PA-C 07/17/22 1911

## 2022-07-17 NOTE — ED Triage Notes (Signed)
Pt here with reports of 1 week of constipation. Now with increasing discomfort.

## 2022-07-17 NOTE — ED Notes (Signed)
IV/Labs 

## 2022-07-17 NOTE — ED Provider Notes (Signed)
[facility] DEPT Provider Note   CSN: [id number] Arrival date & time: 07/17/22  1818     History  Chief Complaint  Patient presents with   Constipation    Michelle [name] is a 61 y.o. female.   Constipation    Patient has history of hypertension hyperlipidemia morbid obesity multiple myeloma.  Patient is currently taking oxycodone for pain.  She has been complaining of constipation for the last week.  Patient denies any vomiting.  No fevers.  She is passing gas.  Patient feels like the stool is in her rectal area.  It is painful when she tries to push.  Patient did speak to her oncologist and started taking lactulose twice daily.  It has not offered her any relief.  Patient feels like she needs to be disimpacted  Home Medications Prior to Admission medications   Medication Sig Start Date End Date Taking? Authorizing Provider  cholecalciferol (VITAMIN D3) 25 MCG (1000 UNIT) tablet Take 1,000 Units by mouth daily. Patient not taking: Reported on 07/04/2022    [provider]  Coenzyme Q10 (COQ10) 100 MG CAPS Take 1 capsule by mouth daily. 04/16/19   [provider]  famciclovir (FAMVIR) 500 MG tablet Take 1 tablet (500 mg total) by mouth daily. Patient not taking: Reported on 07/04/2022 06/27/22   Modena Jansky, MD  fluconazole (DIFLUCAN) 100 MG tablet Take 1 tablet (100 mg total) by mouth daily. 06/07/22   Volanda Napoleon, MD  lactulose (CHRONULAC) 10 GM/15ML solution Take 15 mLs (10 g total) by mouth 2 (two) times daily. 07/16/22   Volanda Napoleon, MD  levothyroxine (SYNTHROID) 88 MCG tablet 1 TABLET EVERY MORNING ON AN EMPTY STOMACH ORALLY ONCE A DAY 90 DAYS    [provider]  metoprolol tartrate (LOPRESSOR) 25 MG tablet Take 1 tablet (25 mg total) by mouth 2 (two) times daily. 06/26/22   Hongalgi, Lenis Dickinson, MD  Multiple Vitamin (MULTIVITAMIN WITH MINERALS) TABS tablet Take 1 tablet by mouth daily. Patient not taking: Reported  on 07/04/2022 06/27/22   Modena Jansky, MD  oxyCODONE 10 MG TABS Take 1 tablet (10 mg total) by mouth every 4 (four) hours as needed for severe pain. 07/04/22   Volanda Napoleon, MD  pantoprazole (PROTONIX) 40 MG tablet Take 1 tablet (40 mg total) by mouth 2 (two) times daily. 06/26/22   Hongalgi, Lenis Dickinson, MD  prochlorperazine (COMPAZINE) 10 MG tablet Take 1 tablet (10 mg total) by mouth every 6 (six) hours as needed for nausea or vomiting. Patient not taking: Reported on 07/04/2022 06/14/22   Tyler Pita, MD  protein supplement (RESOURCE BENEPROTEIN) 6 g POWD Take 1 Scoop (6 g total) by mouth 3 (three) times daily with meals. Patient not taking: Reported on 07/04/2022 06/26/22   Modena Jansky, MD  rosuvastatin (CRESTOR) 10 MG tablet TAKE 1 TABLET BY MOUTH EVERY DAY FOR 90 DAYS    [provider]  senna (SENOKOT) 8.6 MG TABS tablet Take 1 tablet (8.6 mg total) by mouth daily as needed for mild constipation or moderate constipation. Patient not taking: Reported on 07/04/2022 06/26/22   Modena Jansky, MD      Allergies    Penicillins, Codeine, Morphine, and Tramadol    Review of Systems   Review of Systems  Gastrointestinal:  Positive for constipation.    Physical Exam Updated Vital Signs BP (!) 153/86   Pulse (!) 111   Temp 99 F (37.2 C) (Oral)  Resp 20   SpO2 93%  Physical Exam Vitals and nursing note reviewed.  Constitutional:      General: She is not in acute distress.    Appearance: She is well-developed.  HENT:     Head: Normocephalic and atraumatic.     Right Ear: External ear normal.     Left Ear: External ear normal.  Eyes:     General: No scleral icterus.       Right eye: No discharge.        Left eye: No discharge.     Conjunctiva/sclera: Conjunctivae normal.  Neck:     Trachea: No tracheal deviation.  Cardiovascular:     Rate and Rhythm: Normal rate.  Pulmonary:     Effort: Pulmonary effort is normal. No respiratory distress.     Breath  sounds: No stridor.  Abdominal:     General: There is no distension.     Palpations: There is no mass.     Tenderness: There is no abdominal tenderness. There is no guarding.  Genitourinary:    Rectum: No external hemorrhoid. Normal anal tone.     Comments: Fecal impaction appreciated on rectal exam Musculoskeletal:        General: No swelling or deformity.     Cervical back: Neck supple.  Skin:    General: Skin is warm and dry.     Findings: No rash.  Neurological:     Mental Status: She is alert.     Cranial Nerves: Cranial nerve deficit: no gross deficits.     ED Results / Procedures / Treatments   Labs (all labs ordered are listed, but only abnormal results are displayed) Labs Reviewed  CBC WITH DIFFERENTIAL/PLATELET - Abnormal; Notable for the following components:      Result Value   RBC 3.72 (*)    Hemoglobin 10.6 (*)    HCT 32.3 (*)    RDW 17.4 (*)    Platelets 146 (*)    Lymphs Abs 0.3 (*)    All other components within normal limits  COMPREHENSIVE METABOLIC PANEL - Abnormal; Notable for the following components:   Potassium 3.3 (*)    Glucose, Bld 106 (*)    Creatinine, Ser 2.21 (*)    Alkaline Phosphatase 172 (*)    GFR, Estimated 24 (*)    All other components within normal limits  LIPASE, BLOOD    EKG None  Radiology No results found.  Procedures Fecal disimpaction  Date/Time: 07/17/2022 10:12 PM  Performed by: Dorie Rank, MD Authorized by: Dorie Rank, MD  Consent: Verbal consent obtained. Risks and benefits: risks, benefits and alternatives were discussed Consent given by: patient Local anesthesia used: no  Anesthesia: Local anesthesia used: no  Sedation: Patient sedated: no  Patient tolerance: patient tolerated the procedure well with no immediate complications Comments: Large amount of firm brown stool disimpacted.  Patient tolerated without difficulty       Medications Ordered in ED Medications  sodium phosphate (FLEET) 7-19  GM/118ML enema 1 enema (1 enema Rectal Given 07/17/22 2218)  sorbitol, milk of mag, mineral oil, glycerin (SMOG) enema (960 mLs Rectal Given 07/17/22 2311)    ED Course/ Medical Decision Making/ A&P Clinical Course as of 07/17/22 2355  Wed Jul 17, 2022  2244 CBC with Differential(!) Anemia noted stable compared to previous [JK]  2244 Comprehensive metabolic panel(!) Mild hypokalemia noted [JK]  2244 Lipase, blood Lipase normal [JK]  2244 Patient has not noted much improvement after the Fleet enema.  We will try smog enema [JK]  2354 Additional disimpaction performed by the nurse. [JK]    Clinical Course User Index [JK] Dorie Rank, MD                           Medical Decision Making Amount and/or Complexity of Data Reviewed Labs: ordered. Decision-making details documented in ED Course. Radiology: ordered.  Risk OTC drugs.   Patient presented with complaints of constipation.  Initial laboratory tests without significant abnormalities.  Patient required disimpaction.  Enemas have been ordered.  Continuing to have discomfort.  We will proceed with CT to rule out complications such as stercoral colitis.  Continue with enema and supportive treatment.  Care turned over to Dr. Roxanne Mins pending CT scan and response to enema treatments        Final Clinical Impression(s) / ED Diagnoses Final diagnoses:  Fecal impaction in rectum [facility])    Rx / DC Orders ED Discharge Orders     None         Dorie Rank, MD 07/17/22 2356

## 2022-07-18 ENCOUNTER — Emergency Department (HOSPITAL_COMMUNITY): Payer: BC Managed Care – PPO

## 2022-07-18 ENCOUNTER — Telehealth: Payer: Self-pay | Admitting: *Deleted

## 2022-07-18 MED ORDER — GOLYTELY 236 G PO SOLR: 4.0000 L | Freq: Once | ORAL | 0 refills | Status: AC

## 2022-07-18 NOTE — Discharge Instructions (Addendum)
Return if you develop vomiting, increased abdominal pain, or fever.

## 2022-07-18 NOTE — Telephone Encounter (Signed)
Message received from patient to inform Dr. Marin Olp that she was in the ER yesterday with a fecal impaction and that she is feeling much better today. Pt states that she was instructed by the ER to start the Golytely after her appt here tomorrow and to continue Lactulose BID when golytely is complete.  Dr. Marin Olp notified.

## 2022-07-18 NOTE — ED Provider Notes (Signed)
Care assumed from Dr. Tomi Bamberger, patient with fecal impaction pending SMOG enema and CT of abdomen and pelvis.  She had moderate relief with a SMOG enema but continues to feel like she has more stool.  CT does show a lot of stool in the rectum and sigmoid with possible early stercoral colitis.  I have independently viewed the images, and agree with the radiologist's interpretation.  I have talked with the patient and reexamined her and her abdomen is benign.  I offered the option of inpatient versus outpatient management and she preferred to go home.  I am giving her a prescription for polyethylene glycol and she will need to work with her oncologist regarding ongoing bowel management.  Strict return precautions were given.  Results for orders placed or performed during the hospital encounter of 07/17/22  CBC with Differential  Result Value Ref Range   WBC 6.9 4.0 - 10.5 K/uL   RBC 3.72 (L) 3.87 - 5.11 MIL/uL   Hemoglobin 10.6 (L) 12.0 - 15.0 g/dL   HCT 32.3 (L) 36.0 - 46.0 %   MCV 86.8 80.0 - 100.0 fL   MCH 28.5 26.0 - 34.0 pg   MCHC 32.8 30.0 - 36.0 g/dL   RDW 17.4 (H) 11.5 - 15.5 %   Platelets 146 (L) 150 - 400 K/uL   nRBC 0.0 0.0 - 0.2 %   Neutrophils Relative % 80 %   Neutro Abs 5.4 1.7 - 7.7 K/uL   Lymphocytes Relative 4 %   Lymphs Abs 0.3 (L) 0.7 - 4.0 K/uL   Monocytes Relative 15 %   Monocytes Absolute 1.0 0.1 - 1.0 K/uL   Eosinophils Relative 1 %   Eosinophils Absolute 0.0 0.0 - 0.5 K/uL   Basophils Relative 0 %   Basophils Absolute 0.0 0.0 - 0.1 K/uL   Immature Granulocytes 0 %   Abs Immature Granulocytes 0.03 0.00 - 0.07 K/uL  Comprehensive metabolic panel  Result Value Ref Range   Sodium 140 135 - 145 mmol/L   Potassium 3.3 (L) 3.5 - 5.1 mmol/L   Chloride 109 98 - 111 mmol/L   CO2 23 22 - 32 mmol/L   Glucose, Bld 106 (H) 70 - 99 mg/dL   BUN 9 8 - 23 mg/dL   Creatinine, Ser 2.21 (H) 0.44 - 1.00 mg/dL   Calcium 9.4 8.9 - 10.3 mg/dL   Total Protein 6.8 6.5 - 8.1 g/dL    Albumin 3.8 3.5 - 5.0 g/dL   AST 26 15 - 41 U/L   ALT 17 0 - 44 U/L   Alkaline Phosphatase 172 (H) 38 - 126 U/L   Total Bilirubin 0.8 0.3 - 1.2 mg/dL   GFR, Estimated 24 (L) >60 mL/min   Anion gap 8 5 - 15  Lipase, blood  Result Value Ref Range   Lipase 24 11 - 51 U/L   CT ABDOMEN PELVIS WO CONTRAST  Result Date: 07/18/2022 CLINICAL DATA:  Constipation EXAM: CT ABDOMEN AND PELVIS WITHOUT CONTRAST TECHNIQUE: Multidetector CT imaging of the abdomen and pelvis was performed following the standard protocol without IV contrast. RADIATION DOSE REDUCTION: This exam was performed according to the departmental dose-optimization program which includes automated exposure control, adjustment of the mA and/or kV according to patient size and/or use of iterative reconstruction technique. COMPARISON:  PET/CT 05/13/2022 FINDINGS: Lower chest: Moderate-large right and small left pleural effusions and compressive atelectasis. Coronary artery calcification. Hepatobiliary: No focal liver abnormality is seen. No gallstones, gallbladder wall thickening, or biliary dilatation. Pancreas:  Unremarkable. Spleen: Unremarkable. Adrenals/Urinary Tract: Adrenal glands are unremarkable. Kidneys are normal, without renal calculi, focal lesion, or hydronephrosis. Bladder is unremarkable. Stomach/Bowel: Stomach is unremarkable. Normal caliber large and small bowel. Normal appendix. Moderate colonic stool load. Dense stool within the distended rectum with surrounding fluid and rectal wall thickening. Mild adjacent fat stranding. Vascular/Lymphatic: Normal caliber abdominal aorta. No adenopathy by size. Reproductive: Uterus and bilateral adnexa are unremarkable. Other: No free intraperitoneal air. Musculoskeletal: Diffuse osseous lytic lesions compatible with multiple myeloma in the axial and appendicular skeleton. Multiple vertebral body compression fractures at T10, T12, L1, L2, L3, and L5 are age indeterminate. No definite retropulsion.  IMPRESSION: Dense stool in the colon with colon wall thickening and mild adjacent stranding. Findings are compatible with fecal impaction and possible developing stercoral colitis. Moderate colonic stool burden. Diffuse osseous lytic lesions have progressed since 05/13/2022. Multiple pathologic vertebral body compression fractures in the thoracolumbar spine. No definite retropulsion. Moderate-large right and small left pleural effusions. Electronically Signed   By: Placido Sou M.D.   On: 07/18/2022 01:24   DG Chest 2 View  Result Date: 06/22/2022 CLINICAL DATA:  Pleural effusion, recent thoracentesis, back pain EXAM: CHEST - 2 VIEW COMPARISON:  CT chest 06/19/2022 FINDINGS: Small right pleural effusion. Trace left pleural effusion. No focal consolidation. No pneumothorax. Heart and mediastinal contours are unremarkable. Left-sided Port-A-Cath in satisfactory position. Osseous metastatic disease not well visualized on the current examination better seen on the CT of the chest dated 06/19/2022. T12 and L1 vertebral body compression fractures. T10 vertebral body compression fracture. IMPRESSION: 1. Small right and trace left pleural effusions. No pneumothorax. 2. T10, T12 and L1 vertebral body compression fractures. Electronically Signed   By: Kathreen Devoid M.D.   On: 06/22/2022 13:39   CT CHEST WO CONTRAST  Result Date: 06/19/2022 CLINICAL DATA:  Pleural effusion. History of multiple myeloma. * Tracking Code: BO * EXAM: CT CHEST WITHOUT CONTRAST TECHNIQUE: Multidetector CT imaging of the chest was performed following the standard protocol without IV contrast. RADIATION DOSE REDUCTION: This exam was performed according to the departmental dose-optimization program which includes automated exposure control, adjustment of the mA and/or kV according to patient size and/or use of iterative reconstruction technique. COMPARISON:  Chest radiographs including 06/18/2012. PET of 05/13/2012. FINDINGS: Cardiovascular:  A left-sided Port-A-Cath tip at mid right atrium. Aberrant right subclavian artery traversing posterior to the esophagus. Aortic atherosclerosis. Tortuous descending thoracic aorta. Normal heart size, without pericardial effusion. Three vessel coronary artery calcification. Mediastinum/Nodes: Right axillary nodes measure up to 9 mm, similar. No left axillary adenopathy. Prior thyroidectomy. No mediastinal or definite hilar adenopathy, given limitations of unenhanced CT. Lungs/Pleura: Trace right pleural fluid is new since 05/13/2022. Moderate right-sided pleural effusion is similar to on the prior PET. Anterior right-sided focal pleural thickening of 6 mm on 73/2 is similar to on the prior PET. Increase in areas of mild patchy ground-glass within the right lower lobe. Suggestion of concurrent mild peribronchovascular nodularity including on 59/5. Dependent atelectasis in the left lower lobe. Upper Abdomen: Normal imaged portions of the liver, spleen, stomach, pancreas, gallbladder, adrenal glands, kidneys. Musculoskeletal: Innumerable, relatively diffuse lytic lesions throughout the bones. Progressive compared to 05/13/22 PET. For example, a left-sided vertebral body lesion at T10 measures 2.3 cm on 105/2 and is slightly more well-defined than at 2.1 cm on the prior PET. Presumably pathologic fracture at T10 is mild to moderate. Mild compression deformities are seen at T12 and L1. Right-sided paravertebral component at T8-9 measures 2.0  x 1.7 cm on 90/2 versus 1.8 x 1.4 cm when remeasured on the prior PET. Expansile mass in the anterior aspect of the left fourth rib measures 3.5 x 2.2 cm on 77/2 versus 2.6 x 1.5 cm on the prior PET (when remeasured). Dominant expansile right sixth rib soft tissue mass measures 4.1 cm in thickness today versus 3.8 cm on the prior PET. IMPRESSION: 1. Since the PET of 05/13/2022, progression of osseous myeloma as detailed above. 2. Similar moderate right and small left pleural  effusions. 3. Worsened right lower lobe aeration with ground-glass and peribronchovascular nodular opacities. Favor asymmetric pulmonary edema over atypical infection. 4. Age advanced coronary artery atherosclerosis. Recommend assessment of coronary risk factors. 5.  Aortic Atherosclerosis (ICD10-I70.0). Electronically Signed   By: Abigail Miyamoto M.D.   On: 06/19/2022 15:41   DG CHEST PORT 1 VIEW  Result Date: 06/18/2022 CLINICAL DATA:  Follow-up thoracentesis EXAM: PORTABLE CHEST 1 VIEW COMPARISON:  06/15/2022 FINDINGS: Power port in place from a left-sided approach with the tip in the proximal right atrium. No pneumothorax. Previously seen right effusion nearly completely resolved. No pneumothorax. Minimal basilar atelectasis. IMPRESSION: Good appearance following right thoracentesis. Near complete resolution of right effusion. No pneumothorax. New power port in place with the tip in the proximal right atrium. Electronically Signed   By: Nelson Chimes M.D.   On: 93/09/2161 44:69       Delora Fuel, MD 50/72/25 [phone]

## 2022-07-19 ENCOUNTER — Inpatient Hospital Stay: Payer: BC Managed Care – PPO

## 2022-07-19 VITALS — BP 134/77 | HR 93 | Temp 98.3°F | Resp 18

## 2022-07-19 DIAGNOSIS — C9000 Multiple myeloma not having achieved remission: Secondary | ICD-10-CM

## 2022-07-19 DIAGNOSIS — Z5111 Encounter for antineoplastic chemotherapy: Secondary | ICD-10-CM | POA: Diagnosis not present

## 2022-07-19 LAB — CMP (CANCER CENTER ONLY)
ALT: 12 U/L (ref 0–44)
AST: 22 U/L (ref 15–41)
Albumin: 4 g/dL (ref 3.5–5.0)
Alkaline Phosphatase: 185 U/L — ABNORMAL HIGH (ref 38–126)
Anion gap: 8 (ref 5–15)
BUN: 9 mg/dL (ref 8–23)
CO2: 25 mmol/L (ref 22–32)
Calcium: 9.6 mg/dL (ref 8.9–10.3)
Chloride: 105 mmol/L (ref 98–111)
Creatinine: 2.27 mg/dL — ABNORMAL HIGH (ref 0.44–1.00)
GFR, Estimated: 24 mL/min — ABNORMAL LOW (ref >60)
Glucose, Bld: 106 mg/dL — ABNORMAL HIGH (ref 70–99)
Potassium: 3.6 mmol/L (ref 3.5–5.1)
Sodium: 138 mmol/L (ref 135–145)
Total Bilirubin: 0.6 mg/dL (ref 0.3–1.2)
Total Protein: 6.8 g/dL (ref 6.5–8.1)

## 2022-07-19 LAB — CBC WITH DIFFERENTIAL (CANCER CENTER ONLY)
Abs Immature Granulocytes: 0.02 10*3/uL (ref 0.00–0.07)
Basophils Absolute: 0 10*3/uL (ref 0.0–0.1)
Basophils Relative: 0 %
Eosinophils Absolute: 0.1 10*3/uL (ref 0.0–0.5)
Eosinophils Relative: 1 %
HCT: 29.8 % — ABNORMAL LOW (ref 36.0–46.0)
Hemoglobin: 9.8 g/dL — ABNORMAL LOW (ref 12.0–15.0)
Immature Granulocytes: 0 %
Lymphocytes Relative: 4 %
Lymphs Abs: 0.2 10*3/uL — ABNORMAL LOW (ref 0.7–4.0)
MCH: 28 pg (ref 26.0–34.0)
MCHC: 32.9 g/dL (ref 30.0–36.0)
MCV: 85.1 fL (ref 80.0–100.0)
Monocytes Absolute: 1.1 10*3/uL — ABNORMAL HIGH (ref 0.1–1.0)
Monocytes Relative: 19 %
Neutro Abs: 4.3 10*3/uL (ref 1.7–7.7)
Neutrophils Relative %: 76 %
Platelet Count: 151 10*3/uL (ref 150–400)
RBC: 3.5 MIL/uL — ABNORMAL LOW (ref 3.87–5.11)
RDW: 17.6 % — ABNORMAL HIGH (ref 11.5–15.5)
WBC Count: 5.7 10*3/uL (ref 4.0–10.5)
nRBC: 0 % (ref 0.0–0.2)

## 2022-07-19 MED ORDER — ACETAMINOPHEN 325 MG PO TABS
650.0000 mg | ORAL_TABLET | Freq: Once | ORAL | Status: AC
  Administered 2022-07-19: 650 mg via ORAL
  Filled 2022-07-19: qty 2

## 2022-07-19 MED ORDER — SODIUM CHLORIDE 0.9 % IV SOLN
20.0000 mg | Freq: Once | INTRAVENOUS | Status: AC
  Administered 2022-07-19: 20 mg via INTRAVENOUS
  Filled 2022-07-19: qty 20

## 2022-07-19 MED ORDER — SODIUM CHLORIDE 0.9 % IV SOLN
400.0000 mg | Freq: Once | INTRAVENOUS | Status: AC
  Administered 2022-07-19: 400 mg via INTRAVENOUS
  Filled 2022-07-19: qty 20

## 2022-07-19 MED ORDER — DIPHENHYDRAMINE HCL 25 MG PO CAPS
50.0000 mg | ORAL_CAPSULE | Freq: Once | ORAL | Status: AC
  Administered 2022-07-19: 50 mg via ORAL
  Filled 2022-07-19: qty 2

## 2022-07-19 MED ORDER — BORTEZOMIB CHEMO SQ INJECTION 3.5 MG (2.5MG/ML)
1.3000 mg/m2 | Freq: Once | INTRAMUSCULAR | Status: AC
  Administered 2022-07-19: 2.75 mg via SUBCUTANEOUS
  Filled 2022-07-19: qty 1.1

## 2022-07-19 MED ORDER — DARATUMUMAB-HYALURONIDASE-FIHJ 1800-30000 MG-UT/15ML ~~LOC~~ SOLN
1800.0000 mg | Freq: Once | SUBCUTANEOUS | Status: AC
  Administered 2022-07-19: 1800 mg via SUBCUTANEOUS
  Filled 2022-07-19: qty 15

## 2022-07-19 MED ORDER — HEPARIN SOD (PORK) LOCK FLUSH 100 UNIT/ML IV SOLN
500.0000 [IU] | Freq: Once | INTRAVENOUS | Status: AC | PRN
  Administered 2022-07-19: 500 [IU]

## 2022-07-19 MED ORDER — SODIUM CHLORIDE 0.9% FLUSH
10.0000 mL | INTRAVENOUS | Status: DC | PRN
  Administered 2022-07-19: 10 mL

## 2022-07-19 MED ORDER — SODIUM CHLORIDE 0.9 % IV SOLN: Freq: Once | INTRAVENOUS | Status: AC

## 2022-07-19 MED ORDER — MONTELUKAST SODIUM 10 MG PO TABS
10.0000 mg | ORAL_TABLET | Freq: Once | ORAL | Status: AC
  Administered 2022-07-19: 10 mg via ORAL
  Filled 2022-07-19: qty 1

## 2022-07-19 NOTE — Patient Instructions (Signed)
[facility]  Discharge Instructions: Thank you for choosing [facility] to provide your oncology and hematology care.   If you have a lab appointment with the [facility], please go directly to the [facility] and check in at the registration area.  Wear comfortable clothing and clothing appropriate for easy access to any Portacath or PICC line.   We strive to give you quality time with your provider. You may need to reschedule your appointment if you arrive late (15 or more minutes).  Arriving late affects you and other patients whose appointments are after yours.  Also, if you miss three or more appointments without notifying the office, you may be dismissed from the clinic at the provider's discretion.      For prescription refill requests, have your pharmacy contact our office and allow 72 hours for refills to be completed.    Today you received the following chemotherapy and/or immunotherapy agents velcade, cytoxan and darzalex      To help prevent nausea and vomiting after your treatment, we encourage you to take your nausea medication as directed.  BELOW ARE SYMPTOMS THAT SHOULD BE REPORTED IMMEDIATELY: *FEVER GREATER THAN 100.4 F (38 C) OR HIGHER *CHILLS OR SWEATING *NAUSEA AND VOMITING THAT IS NOT CONTROLLED WITH YOUR NAUSEA MEDICATION *UNUSUAL SHORTNESS OF BREATH *UNUSUAL BRUISING OR BLEEDING *URINARY PROBLEMS (pain or burning when urinating, or frequent urination) *BOWEL PROBLEMS (unusual diarrhea, constipation, pain near the anus) TENDERNESS IN MOUTH AND THROAT WITH OR WITHOUT PRESENCE OF ULCERS (sore throat, sores in mouth, or a toothache) UNUSUAL RASH, SWELLING OR PAIN  UNUSUAL VAGINAL DISCHARGE OR ITCHING   Items with * indicate a potential emergency and should be followed up as soon as possible or go to the Emergency Department if any problems should occur.  Please show the CHEMOTHERAPY ALERT CARD or IMMUNOTHERAPY ALERT CARD  at check-in to the Emergency Department and triage nurse. Should you have questions after your visit or need to cancel or reschedule your appointment, please contact [facility]  [phone] and follow the prompts.  Office hours are 8:00 a.m. to 4:30 p.m. Monday - Friday. Please note that voicemails left after 4:00 p.m. may not be returned until the following business day.  We are closed weekends and major holidays. You have access to a nurse at all times for urgent questions. Please call the main number to the clinic [phone] and follow the prompts.  For any non-urgent questions, you may also contact your provider using MyChart. We now offer e-Visits for anyone 47 and older to request care online for non-urgent symptoms. For details visit [email or web address].   Also download the MyChart app! Go to the app store, search "MyChart", open the app, select [facility], and log in with your MyChart username and password.  Masks are optional in the cancer centers. If you would like for your care team to wear a mask while they are taking care of you, please let them know. You may have one support person who is at least 63 years old accompany you for your appointments.

## 2022-07-19 NOTE — Patient Instructions (Signed)

## 2022-07-25 ENCOUNTER — Telehealth: Payer: Self-pay

## 2022-07-25 NOTE — Telephone Encounter (Signed)
Received a call from Avelino Leeds a PT with [facility] who has been managing pt. He states pt has been having asymptomatic high b/ps and was previously told by [place] to advise the PCP and let them make the call on what to do. The PCP instructed her to double the dose when her reading was above 140/ 90. Pt is currently prescribed to take Metoprolol BID. Clair Gulling states pt has not took the PCP's advise to do this and would like to know Dr Antonieta Pert input on this. Per Dr Marin Olp pt should follow the PCP's instruction to help manage her b/p better. Pt advised and states she will follow up with PCP too.

## 2022-07-26 ENCOUNTER — Inpatient Hospital Stay: Payer: BC Managed Care – PPO

## 2022-07-26 ENCOUNTER — Other Ambulatory Visit: Payer: Self-pay

## 2022-07-26 VITALS — BP 128/67 | HR 92 | Resp 17

## 2022-07-26 DIAGNOSIS — C7951 Secondary malignant neoplasm of bone: Secondary | ICD-10-CM

## 2022-07-26 DIAGNOSIS — C9000 Multiple myeloma not having achieved remission: Secondary | ICD-10-CM

## 2022-07-26 DIAGNOSIS — Z5111 Encounter for antineoplastic chemotherapy: Secondary | ICD-10-CM | POA: Diagnosis not present

## 2022-07-26 LAB — CBC WITH DIFFERENTIAL (CANCER CENTER ONLY)
Abs Immature Granulocytes: 0.02 10*3/uL (ref 0.00–0.07)
Basophils Absolute: 0 10*3/uL (ref 0.0–0.1)
Basophils Relative: 0 %
Eosinophils Absolute: 0.1 10*3/uL (ref 0.0–0.5)
Eosinophils Relative: 2 %
HCT: 29.9 % — ABNORMAL LOW (ref 36.0–46.0)
Hemoglobin: 9.8 g/dL — ABNORMAL LOW (ref 12.0–15.0)
Immature Granulocytes: 0 %
Lymphocytes Relative: 5 %
Lymphs Abs: 0.2 10*3/uL — ABNORMAL LOW (ref 0.7–4.0)
MCH: 28 pg (ref 26.0–34.0)
MCHC: 32.8 g/dL (ref 30.0–36.0)
MCV: 85.4 fL (ref 80.0–100.0)
Monocytes Absolute: 0.8 10*3/uL (ref 0.1–1.0)
Monocytes Relative: 18 %
Neutro Abs: 3.5 10*3/uL (ref 1.7–7.7)
Neutrophils Relative %: 75 %
Platelet Count: 145 10*3/uL — ABNORMAL LOW (ref 150–400)
RBC: 3.5 MIL/uL — ABNORMAL LOW (ref 3.87–5.11)
RDW: 17.6 % — ABNORMAL HIGH (ref 11.5–15.5)
WBC Count: 4.7 10*3/uL (ref 4.0–10.5)
nRBC: 0 % (ref 0.0–0.2)

## 2022-07-26 LAB — CMP (CANCER CENTER ONLY)
ALT: 13 U/L (ref 0–44)
AST: 20 U/L (ref 15–41)
Albumin: 4 g/dL (ref 3.5–5.0)
Alkaline Phosphatase: 239 U/L — ABNORMAL HIGH (ref 38–126)
Anion gap: 8 (ref 5–15)
BUN: 8 mg/dL (ref 8–23)
CO2: 26 mmol/L (ref 22–32)
Calcium: 9.7 mg/dL (ref 8.9–10.3)
Chloride: 103 mmol/L (ref 98–111)
Creatinine: 2.06 mg/dL — ABNORMAL HIGH (ref 0.44–1.00)
GFR, Estimated: 27 mL/min — ABNORMAL LOW (ref >60)
Glucose, Bld: 113 mg/dL — ABNORMAL HIGH (ref 70–99)
Potassium: 3.5 mmol/L (ref 3.5–5.1)
Sodium: 137 mmol/L (ref 135–145)
Total Bilirubin: 0.5 mg/dL (ref 0.3–1.2)
Total Protein: 6.6 g/dL (ref 6.5–8.1)

## 2022-07-26 MED ORDER — DEXAMETHASONE 4 MG PO TABS
20.0000 mg | ORAL_TABLET | Freq: Once | ORAL | Status: AC
  Administered 2022-07-26: 20 mg via ORAL
  Filled 2022-07-26: qty 5

## 2022-07-26 MED ORDER — HEPARIN SOD (PORK) LOCK FLUSH 100 UNIT/ML IV SOLN
500.0000 [IU] | Freq: Once | INTRAVENOUS | Status: AC
  Administered 2022-07-26: 500 [IU] via INTRAVENOUS

## 2022-07-26 MED ORDER — DARATUMUMAB-HYALURONIDASE-FIHJ 1800-30000 MG-UT/15ML ~~LOC~~ SOLN
1800.0000 mg | Freq: Once | SUBCUTANEOUS | Status: AC
  Administered 2022-07-26: 1800 mg via SUBCUTANEOUS
  Filled 2022-07-26: qty 15

## 2022-07-26 MED ORDER — ACETAMINOPHEN 325 MG PO TABS
650.0000 mg | ORAL_TABLET | Freq: Once | ORAL | Status: AC
  Administered 2022-07-26: 650 mg via ORAL
  Filled 2022-07-26: qty 2

## 2022-07-26 MED ORDER — SODIUM CHLORIDE 0.9% FLUSH
10.0000 mL | Freq: Once | INTRAVENOUS | Status: AC
  Administered 2022-07-26: 10 mL via INTRAVENOUS

## 2022-07-26 MED ORDER — DIPHENHYDRAMINE HCL 25 MG PO CAPS
50.0000 mg | ORAL_CAPSULE | Freq: Once | ORAL | Status: AC
  Administered 2022-07-26: 50 mg via ORAL
  Filled 2022-07-26: qty 2

## 2022-07-26 NOTE — Patient Instructions (Signed)

## 2022-08-02 ENCOUNTER — Inpatient Hospital Stay: Payer: BC Managed Care – PPO

## 2022-08-02 ENCOUNTER — Encounter: Payer: Self-pay | Admitting: *Deleted

## 2022-08-02 ENCOUNTER — Inpatient Hospital Stay: Payer: BC Managed Care – PPO | Admitting: Hematology & Oncology

## 2022-08-02 ENCOUNTER — Telehealth: Payer: Self-pay

## 2022-08-02 ENCOUNTER — Encounter: Payer: Self-pay | Admitting: Hematology & Oncology

## 2022-08-02 VITALS — BP 148/76 | HR 111

## 2022-08-02 VITALS — BP 138/85 | HR 110 | Temp 98.6°F | Resp 20 | Ht 64.0 in | Wt 182.4 lb

## 2022-08-02 DIAGNOSIS — C9000 Multiple myeloma not having achieved remission: Secondary | ICD-10-CM

## 2022-08-02 DIAGNOSIS — C7951 Secondary malignant neoplasm of bone: Secondary | ICD-10-CM

## 2022-08-02 DIAGNOSIS — D631 Anemia in chronic kidney disease: Secondary | ICD-10-CM | POA: Diagnosis not present

## 2022-08-02 DIAGNOSIS — M5440 Lumbago with sciatica, unspecified side: Secondary | ICD-10-CM

## 2022-08-02 DIAGNOSIS — N1832 Chronic kidney disease, stage 3b: Secondary | ICD-10-CM

## 2022-08-02 DIAGNOSIS — Z5111 Encounter for antineoplastic chemotherapy: Secondary | ICD-10-CM | POA: Diagnosis not present

## 2022-08-02 HISTORY — DX: Anemia in chronic kidney disease: D63.1

## 2022-08-02 LAB — SAMPLE TO BLOOD BANK

## 2022-08-02 LAB — CMP (CANCER CENTER ONLY)
ALT: 13 U/L (ref 0–44)
AST: 21 U/L (ref 15–41)
Albumin: 4 g/dL (ref 3.5–5.0)
Alkaline Phosphatase: 248 U/L — ABNORMAL HIGH (ref 38–126)
Anion gap: 8 (ref 5–15)
BUN: 10 mg/dL (ref 8–23)
CO2: 25 mmol/L (ref 22–32)
Calcium: 9.4 mg/dL (ref 8.9–10.3)
Chloride: 104 mmol/L (ref 98–111)
Creatinine: 1.93 mg/dL — ABNORMAL HIGH (ref 0.44–1.00)
GFR, Estimated: 29 mL/min — ABNORMAL LOW (ref >60)
Glucose, Bld: 112 mg/dL — ABNORMAL HIGH (ref 70–99)
Potassium: 3.7 mmol/L (ref 3.5–5.1)
Sodium: 137 mmol/L (ref 135–145)
Total Bilirubin: 0.5 mg/dL (ref 0.3–1.2)
Total Protein: 6.5 g/dL (ref 6.5–8.1)

## 2022-08-02 LAB — CBC WITH DIFFERENTIAL (CANCER CENTER ONLY)
Abs Immature Granulocytes: 0.07 10*3/uL (ref 0.00–0.07)
Basophils Absolute: 0 10*3/uL (ref 0.0–0.1)
Basophils Relative: 0 %
Eosinophils Absolute: 0.1 10*3/uL (ref 0.0–0.5)
Eosinophils Relative: 2 %
HCT: 29.9 % — ABNORMAL LOW (ref 36.0–46.0)
Hemoglobin: 9.6 g/dL — ABNORMAL LOW (ref 12.0–15.0)
Immature Granulocytes: 1 %
Lymphocytes Relative: 14 %
Lymphs Abs: 0.7 10*3/uL (ref 0.7–4.0)
MCH: 27.4 pg (ref 26.0–34.0)
MCHC: 32.1 g/dL (ref 30.0–36.0)
MCV: 85.2 fL (ref 80.0–100.0)
Monocytes Absolute: 1.1 10*3/uL — ABNORMAL HIGH (ref 0.1–1.0)
Monocytes Relative: 21 %
Neutro Abs: 3.2 10*3/uL (ref 1.7–7.7)
Neutrophils Relative %: 62 %
Platelet Count: 161 10*3/uL (ref 150–400)
RBC: 3.51 MIL/uL — ABNORMAL LOW (ref 3.87–5.11)
RDW: 17.7 % — ABNORMAL HIGH (ref 11.5–15.5)
WBC Count: 5.1 10*3/uL (ref 4.0–10.5)
nRBC: 0 % (ref 0.0–0.2)

## 2022-08-02 LAB — IRON AND IRON BINDING CAPACITY (CC-WL,HP ONLY)
Iron: 46 ug/dL (ref 28–170)
Saturation Ratios: 21 % (ref 10.4–31.8)
TIBC: 220 ug/dL — ABNORMAL LOW (ref 250–450)
UIBC: 174 ug/dL (ref 148–442)

## 2022-08-02 LAB — LACTATE DEHYDROGENASE: LDH: 256 U/L — ABNORMAL HIGH (ref 98–192)

## 2022-08-02 LAB — FERRITIN: Ferritin: 492 ng/mL — ABNORMAL HIGH (ref 11–307)

## 2022-08-02 LAB — RETICULOCYTES
Immature Retic Fract: 11.5 % (ref 2.3–15.9)
RBC.: 3.53 MIL/uL — ABNORMAL LOW (ref 3.87–5.11)
Retic Count, Absolute: 44.8 10*3/uL (ref 19.0–186.0)
Retic Ct Pct: 1.3 % (ref 0.4–3.1)

## 2022-08-02 MED ORDER — DIPHENHYDRAMINE HCL 25 MG PO CAPS
50.0000 mg | ORAL_CAPSULE | Freq: Once | ORAL | Status: AC
  Administered 2022-08-02: 50 mg via ORAL
  Filled 2022-08-02: qty 2

## 2022-08-02 MED ORDER — BORTEZOMIB CHEMO SQ INJECTION 3.5 MG (2.5MG/ML)
1.3000 mg/m2 | Freq: Once | INTRAMUSCULAR | Status: AC
  Administered 2022-08-02: 2.75 mg via SUBCUTANEOUS
  Filled 2022-08-02: qty 1.1

## 2022-08-02 MED ORDER — DENOSUMAB 120 MG/1.7ML ~~LOC~~ SOLN: 120.0000 mg | Freq: Once | SUBCUTANEOUS | Status: DC

## 2022-08-02 MED ORDER — SODIUM CHLORIDE 0.9% FLUSH
10.0000 mL | INTRAVENOUS | Status: DC | PRN
  Administered 2022-08-02: 10 mL

## 2022-08-02 MED ORDER — DARATUMUMAB-HYALURONIDASE-FIHJ 1800-30000 MG-UT/15ML ~~LOC~~ SOLN
1800.0000 mg | Freq: Once | SUBCUTANEOUS | Status: AC
  Administered 2022-08-02: 1800 mg via SUBCUTANEOUS
  Filled 2022-08-02: qty 15

## 2022-08-02 MED ORDER — DARBEPOETIN ALFA 300 MCG/0.6ML IJ SOSY
300.0000 ug | PREFILLED_SYRINGE | Freq: Once | INTRAMUSCULAR | Status: AC
  Administered 2022-08-02: 300 ug via SUBCUTANEOUS
  Filled 2022-08-02: qty 0.6

## 2022-08-02 MED ORDER — OXYCODONE HCL 10 MG PO TABS: 10.0000 mg | ORAL_TABLET | ORAL | 0 refills | Status: DC | PRN

## 2022-08-02 MED ORDER — HEPARIN SOD (PORK) LOCK FLUSH 100 UNIT/ML IV SOLN
500.0000 [IU] | Freq: Once | INTRAVENOUS | Status: AC | PRN
  Administered 2022-08-02: 500 [IU]

## 2022-08-02 MED ORDER — SODIUM CHLORIDE 0.9 % IV SOLN: Freq: Once | INTRAVENOUS | Status: AC

## 2022-08-02 MED ORDER — SODIUM CHLORIDE 0.9 % IV SOLN
400.0000 mg | Freq: Once | INTRAVENOUS | Status: AC
  Administered 2022-08-02: 400 mg via INTRAVENOUS
  Filled 2022-08-02: qty 18

## 2022-08-02 MED ORDER — ACETAMINOPHEN 325 MG PO TABS
650.0000 mg | ORAL_TABLET | Freq: Once | ORAL | Status: AC
  Administered 2022-08-02: 650 mg via ORAL
  Filled 2022-08-02: qty 2

## 2022-08-02 MED ORDER — SODIUM CHLORIDE 0.9 % IV SOLN
20.0000 mg | Freq: Once | INTRAVENOUS | Status: AC
  Administered 2022-08-02: 20 mg via INTRAVENOUS
  Filled 2022-08-02: qty 20

## 2022-08-02 NOTE — Progress Notes (Signed)
Patient received Zometa 4 mg while inpatient on 06/14/22. Will begin quarterly Xgeva in November per Dr. Marin Olp.

## 2022-08-02 NOTE — Progress Notes (Unsigned)
Patient is here for treatment. She feels okay but would rather be home. She has been losing weight. Dietary and Social Work consult made. Ensure samples given.   Oncology Nurse Navigator Documentation     08/02/2022   10:30 AM  Oncology Nurse Navigator Flowsheets  Navigator Follow Up Date: 09/06/2022  Navigator Follow Up Reason: Follow-up Appointment;Chemotherapy  Navigator Location [facility]  Navigator Encounter Type Treatment;Appt/Treatment Plan Review  Patient Visit Type MedOnc  Treatment Phase Active Tx  Barriers/Navigation Needs Coordination of Care;Education  Interventions Psycho-Social Support;Referrals  Acuity Level 2-Minimal Needs (1-2 Barriers Identified)  Referrals Nutrition/dietician;Social Work  Support Groups/Services Friends and Family  Time Spent with Patient 15

## 2022-08-02 NOTE — Progress Notes (Signed)
Hematology and Oncology Follow Up Visit  Michelle [name] [id number] 1961-08-31 61 y.o. 08/02/2022   Principle Diagnosis:  IgG Kappa myeloma -- normal cytogenetics -- dup 1p/17p-/t(14:20) Anemia of renal failure -- stage 3b  Current Therapy:   Faspro//Cytoxan/Velcade/Decadron -- s/p cycle #1 -- start on 06/20/2022 XRT to lumbar spine -- completed on 06/26/2022 Xgeva 120 mg sq q 3 months - next dose in 10/2022 Aranesp 300 mcg sq q month for Hgb < 11     Interim History:  [name] is back for follow-up.  So far, they were making some progress with the myeloma.  We will check her myeloma studies today.  She does have some adverse abnormalities on FISH studies.  As such, I think going to have to be aggressive.  Need to make sure that she has Aranesp.  She does have anemia of renal insufficiency.  Her erythropoietin level is only 13.  I think that she would benefit from some Aranesp.  She says her pain is doing better.  She has some radiation therapy to the lumbar spine.  She has had no problems with bowels or bladder.  She has had no nausea or vomiting.  She has had no bleeding.  There is no cough or shortness of breath.  She has had no leg swelling.  Overall, I would have said that her performance status is probably ECOG 2.    Medications:  Current Outpatient Medications:    levothyroxine (SYNTHROID) 88 MCG tablet, 1 TABLET EVERY MORNING ON AN EMPTY STOMACH ORALLY ONCE A DAY 90 DAYS, Disp: , Rfl:    metoprolol tartrate (LOPRESSOR) 25 MG tablet, Take 1 tablet (25 mg total) by mouth 2 (two) times daily., Disp: 60 tablet, Rfl: 1   Multiple Vitamin (MULTIVITAMIN WITH MINERALS) TABS tablet, Take 1 tablet by mouth daily., Disp: , Rfl:    oxyCODONE 10 MG TABS, Take 1 tablet (10 mg total) by mouth every 4 (four) hours as needed for severe pain., Disp: 120 tablet, Rfl: 0   pantoprazole (PROTONIX) 40 MG tablet, Take 1 tablet (40 mg total) by mouth 2 (two) times daily., Disp: 60 tablet, Rfl: 1    rosuvastatin (CRESTOR) 10 MG tablet, TAKE 1 TABLET BY MOUTH EVERY DAY FOR 90 DAYS, Disp: , Rfl:    senna (SENOKOT) 8.6 MG TABS tablet, Take 1 tablet (8.6 mg total) by mouth daily as needed for mild constipation or moderate constipation., Disp: 30 tablet, Rfl: 0   fluconazole (DIFLUCAN) 100 MG tablet, Take 100 mg by mouth daily. (Patient not taking: Reported on 08/02/2022), Disp: , Rfl:    fluticasone (FLONASE) 50 MCG/ACT nasal spray, Place 1 spray into both nostrils daily. (Patient not taking: Reported on 08/02/2022), Disp: , Rfl:    lisinopril-hydrochlorothiazide (ZESTORETIC) 10-12.5 MG tablet, Take 1 tablet by mouth daily., Disp: , Rfl:    prochlorperazine (COMPAZINE) 10 MG tablet, Take 1 tablet (10 mg total) by mouth every 6 (six) hours as needed for nausea or vomiting. (Patient not taking: Reported on 07/18/2022), Disp: 30 tablet, Rfl: 5  Allergies:  Allergies  Allergen Reactions   Penicillins Other (See Comments)    Severe Yeast infection... No deathly reactions   Codeine Nausea Only   Morphine Other (See Comments)    headache   Tramadol     headache    Past Medical History, Surgical history, Social history, and Family History were reviewed and updated.  Review of Systems: Review of Systems  Constitutional:  Positive for fatigue.  HENT:  Negative.    Eyes: Negative.   Respiratory: Negative.    Cardiovascular:  Positive for leg swelling.  Gastrointestinal: Negative.   Endocrine: Negative.   Genitourinary: Negative.    Musculoskeletal:  Positive for arthralgias, back pain and myalgias.  Skin: Negative.   Neurological: Negative.   Hematological: Negative.   Psychiatric/Behavioral: Negative.      Physical Exam:  vitals were not taken for this visit.   Wt Readings from Last 3 Encounters:  07/04/22 (P) 205 lb 9.6 oz (93.3 kg)  06/25/22 212 lb (96.2 kg)  06/11/22 202 lb (91.6 kg)    Physical Exam Vitals reviewed.  HENT:     Head: Normocephalic and atraumatic.  Eyes:      Pupils: Pupils are equal, round, and reactive to light.  Cardiovascular:     Rate and Rhythm: Normal rate and regular rhythm.     Heart sounds: Normal heart sounds.  Pulmonary:     Effort: Pulmonary effort is normal.     Breath sounds: Normal breath sounds.  Abdominal:     General: Bowel sounds are normal.     Palpations: Abdomen is soft.  Musculoskeletal:        General: No tenderness or deformity. Normal range of motion.     Cervical back: Normal range of motion.     Comments: Her lower extremities does show some swelling bilateral.  She probably has 2+ edema bilaterally in her legs.  Lymphadenopathy:     Cervical: No cervical adenopathy.  Skin:    General: Skin is warm and dry.     Findings: No erythema or rash.  Neurological:     Mental Status: She is alert and oriented to person, place, and time.  Psychiatric:        Behavior: Behavior normal.        Thought Content: Thought content normal.        Judgment: Judgment normal.      Lab Results  Component Value Date   WBC 5.1 08/02/2022   HGB 9.6 (L) 08/02/2022   HCT 29.9 (L) 08/02/2022   MCV 85.2 08/02/2022   PLT 161 08/02/2022     Chemistry      Component Value Date/Time   NA 137 07/26/2022 0955   K 3.5 07/26/2022 0955   CL 103 07/26/2022 0955   CO2 26 07/26/2022 0955   BUN 8 07/26/2022 0955   CREATININE 2.06 (H) 07/26/2022 0955      Component Value Date/Time   CALCIUM 9.7 07/26/2022 0955   ALKPHOS 239 (H) 07/26/2022 0955   AST 20 07/26/2022 0955   ALT 13 07/26/2022 0955   BILITOT 0.5 07/26/2022 0955      Impression and Plan: [name] is a very nice African-American female.  She has IgG kappa myeloma.  The problem clearly is with the light chains.  I think this is what was causing her renal insufficiency.  Her renal function is a little bit better.  We will now have her on full dose treatment.  We will have her on Faspro along with the Cytoxan/Velcade.  We should be able to see a good response.  We have  never been able to get a 24-hour urine on her.  So not sure what her pretreatment 24-hour urine level was.  I am just happy that her quality life is improving.  We will go ahead with her second full cycle of treatment now.  Again, we will give her Delton See today.  We will give her  Aranesp to try to help get her anemia better.  I will plan to see her back in a month.  I still would like to try to get her to transplant.  I think given her high risk disease, transplant is clearly going to be beneficial for her.  In fact, she she may need a double transplant.    Volanda Napoleon, MD 9/29/202310:37 AM

## 2022-08-02 NOTE — Progress Notes (Signed)
Per Dr. Marin Olp, okay to treat with elevated pulse rate.

## 2022-08-02 NOTE — Patient Instructions (Signed)
[facility]  Discharge Instructions: Thank you for choosing [facility] to provide your oncology and hematology care.   If you have a lab appointment with the [facility], please go directly to the [facility] and check in at the registration area.  Wear comfortable clothing and clothing appropriate for easy access to any Portacath or PICC line.   We strive to give you quality time with your provider. You may need to reschedule your appointment if you arrive late (15 or more minutes).  Arriving late affects you and other patients whose appointments are after yours.  Also, if you miss three or more appointments without notifying the office, you may be dismissed from the clinic at the provider's discretion.      For prescription refill requests, have your pharmacy contact our office and allow 72 hours for refills to be completed.    Today you received the following chemotherapy and/or immunotherapy agents darzalex,velcade, cytoxan     To help prevent nausea and vomiting after your treatment, we encourage you to take your nausea medication as directed.  BELOW ARE SYMPTOMS THAT SHOULD BE REPORTED IMMEDIATELY: *FEVER GREATER THAN 100.4 F (38 C) OR HIGHER *CHILLS OR SWEATING *NAUSEA AND VOMITING THAT IS NOT CONTROLLED WITH YOUR NAUSEA MEDICATION *UNUSUAL SHORTNESS OF BREATH *UNUSUAL BRUISING OR BLEEDING *URINARY PROBLEMS (pain or burning when urinating, or frequent urination) *BOWEL PROBLEMS (unusual diarrhea, constipation, pain near the anus) TENDERNESS IN MOUTH AND THROAT WITH OR WITHOUT PRESENCE OF ULCERS (sore throat, sores in mouth, or a toothache) UNUSUAL RASH, SWELLING OR PAIN  UNUSUAL VAGINAL DISCHARGE OR ITCHING   Items with * indicate a potential emergency and should be followed up as soon as possible or go to the Emergency Department if any problems should occur.  Please show the CHEMOTHERAPY ALERT CARD or IMMUNOTHERAPY ALERT CARD at  check-in to the Emergency Department and triage nurse. Should you have questions after your visit or need to cancel or reschedule your appointment, please contact [facility]  [phone] and follow the prompts.  Office hours are 8:00 a.m. to 4:30 p.m. Monday - Friday. Please note that voicemails left after 4:00 p.m. may not be returned until the following business day.  We are closed weekends and major holidays. You have access to a nurse at all times for urgent questions. Please call the main number to the clinic [phone] and follow the prompts.  For any non-urgent questions, you may also contact your provider using MyChart. We now offer e-Visits for anyone 69 and older to request care online for non-urgent symptoms. For details visit [email or web address].   Also download the MyChart app! Go to the app store, search "MyChart", open the app, select [facility], and log in with your MyChart username and password.  Masks are optional in the cancer centers. If you would like for your care team to wear a mask while they are taking care of you, please let them know. You may have one support person who is at least 62 years old accompany you for your appointments.

## 2022-08-02 NOTE — Patient Instructions (Signed)

## 2022-08-02 NOTE — Telephone Encounter (Signed)
CSW attempted to contact patient per the request of the Nurse Navigator to assess psychosocial needs.  Patient's vm was full.  CSW sent her an email requesting a call or email.

## 2022-08-02 NOTE — Progress Notes (Signed)
Okay to treat with HR 110 per Dr. Marin Olp.

## 2022-08-04 LAB — IGG, IGA, IGM
IgA: <5 mg/dL — ABNORMAL LOW (ref 87–352)
IgG (Immunoglobin G), Serum: 794 mg/dL (ref 586–1602)
IgM (Immunoglobulin M), Srm: 9 mg/dL — ABNORMAL LOW (ref 26–217)

## 2022-08-05 LAB — KAPPA/LAMBDA LIGHT CHAINS
Kappa free light chain: 565.5 mg/L — ABNORMAL HIGH (ref 3.3–19.4)
Kappa, lambda light chain ratio: 176.72 — ABNORMAL HIGH (ref 0.26–1.65)
Lambda free light chains: 3.2 mg/L — ABNORMAL LOW (ref 5.7–26.3)

## 2022-08-06 ENCOUNTER — Encounter: Payer: Self-pay | Admitting: *Deleted

## 2022-08-06 ENCOUNTER — Encounter: Payer: Self-pay | Admitting: Hematology & Oncology

## 2022-08-06 ENCOUNTER — Other Ambulatory Visit: Payer: Self-pay

## 2022-08-06 ENCOUNTER — Ambulatory Visit: Payer: BC Managed Care – PPO | Admitting: Dietician

## 2022-08-06 NOTE — Progress Notes (Signed)
Nutrition Assessment: Reached out to patient at home/mobile telephone number.    Reason for Assessment: Nurse navigator referral.   ASSESSMENT: Patient is 61 year old female with IgG Kappa myeloma -- normal cytogenetics with bone mets and anemia.  She has PMHx that includes CKD, HTN, Hypothyroidism, Pleural effusion, and HLD.  She's receiving  Carfizomib, Darzalex, Aranesp, and Xgeva and being followed by Dr. Marin Olp.   She reports weight loss has been related to food has no taste, decreased appetite which ,ay be related to constipation and recent impacted bowels. She has been trying to do more to better her overall all health and improve bowels. Yesterday walked 20 minutes. Trying to increase her activity to help with bowels and over all health, pain inhibits her ability some days to move more.  She has 2 supportive daughters, ones lives with her Unice Cobble) and other close.  She is willing to do the Ensure ONS but doesn't really like the taste and would consider blending with ice cream.  She tolerates milk but not yogurt anymore, and would consider home blended shakes.   PO intake yesterday:  Apple sauce, Noodles,  Apple, broccoli,  1/2 Ensure complete Fluids: Water 3 glasses, Hot Tea 1/2-1 cup,    Had cream of wheat cereal this morning may snack on something prior to lunch.    Nutrition Focused Physical Exam: no depletions per IP RD (06/25/22)    Medications: Taking senokot daily, not using lactulose now, is currently taking  MVI daily, Vit D   Labs: 08/02/22  GFR 29, Creat 1.93, Glucose 112, Ferritin 492, Hgb 9.6   Anthropometrics:  Weight has significantly fluctuated with bowels and recent poor PO intake, down 23# (11%) past month   Height: 64" Weight:  08/02/22  182.4# 07/04/22  205.6# 06/25/22 212# 06/14/22  197.4#  UBW: 200-210# BMI: 31.31   Estimated Energy Needs  Kcals: 2100-2500 Protein: 83-100 g Fluid: 2.5-3 L   NUTRITION DIAGNOSIS: Recent inadequate PO intake  overall related to taste, anorexia, and constipation AEB significant weight loss and usual PO intake per recall.      INTERVENTION:   Educated on importance of adequate fluids, calories and protein energy intake  with nutrient dense foods when possible to maintain weight/strength Encouraged use of high calorie fluids as tolerated (doesn't like prune, apple juices or yogurts, will consider milk and home blended shakes or blended ONS)   Discussed ways to add calories/protein to foods (adding bean type soups, creamed soups, sauces/gravy)  Encouraged soft moist fruits and vegetables to aid in bowel regularity, as well as small frequent meals/snacks -  Suggested two oral nutrition supplements per day  Emailed Nutrition Tip sheet  for  Nutrition During Cancer Treatment, Constipation, High Calorie Snacking, home blended smoothie recipes with dairy.  Contact information provided.  MONITORING, EVALUATION, GOAL: weight trends, nutrition impact symptoms, PO intake, labs  Next Visit: Remote next week  April Manson, RDN, LDN Registered Dietitian, [place] (Usual office hours: Tuesday-Thursday) Mobile: [phone] Remote Office: [phone]

## 2022-08-07 ENCOUNTER — Encounter: Payer: Self-pay | Admitting: Hematology & Oncology

## 2022-08-07 ENCOUNTER — Other Ambulatory Visit: Payer: Self-pay | Admitting: Hematology & Oncology

## 2022-08-07 ENCOUNTER — Other Ambulatory Visit: Payer: Self-pay

## 2022-08-07 ENCOUNTER — Encounter: Payer: Self-pay | Admitting: Urology

## 2022-08-07 DIAGNOSIS — K5900 Constipation, unspecified: Secondary | ICD-10-CM

## 2022-08-07 NOTE — Progress Notes (Addendum)
  Radiation Oncology         [phone] ________________________________  Name: Michelle [name] MRN: [id number]  Date: 06/26/2022  DOB: 1961/01/01  End of Treatment Note  Diagnosis:   61 y/o female with painful bony metastases in the thoracolumbar spine secondary to multiple myeloma.      Indication for treatment:  Palliation of pain       Radiation treatment dates:   06/13/22 - 06/26/22  Site/dose:   The painful sites of disease in the thoracolumbar spine (T11-S1) were treated to 20 Gy in 10 fractions of 2 Gy each.  Beams/energy:   A 3D field set-up was employed with 6 MV X-rays  Narrative: The patient tolerated radiation treatment relatively well.     Plan: The patient has completed radiation treatment. The patient will return to radiation oncology clinic for routine followup in one month. I advised her to call or return sooner if she has any questions or concerns related to her recovery or treatment. ________________________________  Sheral Apley. Tammi Klippel, M.D.

## 2022-08-07 NOTE — Progress Notes (Signed)
Telephone appointment. I verified patient's identity and began nursing interview. Patient reports dorsalgia 4/10. No other issues reported at this time.  Meaningful use complete.  Patient aware of her 9:00am-08/08/22 telephone appointment w/ Ashlyn Bruning PA-C. I left my extension [phone] in case patient needs anything. Patient verbalized understanding.  Patient contact [phone]

## 2022-08-07 NOTE — Progress Notes (Addendum)
Radiation Oncology         [phone] ________________________________  Name: BRONWYN BELASCO MRN: [id number]  Date: 08/08/2022  DOB: 14-Mar-1961  Post Treatment Note  CC: Leeroy Cha, MD  Leeroy Cha,*  Diagnosis:   61 y/o female with painful bony metastases in the thoracolumbar spine secondary to multiple myeloma.   Interval Since Last Radiation:  6 weeks  06/13/22 - 06/26/22:  The painful sites of disease in the thoracolumbar spine (T11-S1) were treated to 20 Gy in 10 fractions of 2 Gy each.  Narrative:  I spoke with the patient to conduct her routine scheduled 1 month follow up visit via telephone to spare the patient unnecessary potential exposure in the healthcare setting during the current COVID-19 pandemic.  The patient was notified in advance and gave permission to proceed with this visit format.  She tolerated treatment well without any ill side effects.                              On review of systems, the patient states that she is doing very well in general.  She has noticed significant improvement in her back pain although she admits that some days are better than others.  For the most part, it has allowed her to remain active and comfortable.  She specifically denies any focal weakness or paresthesias in her upper or lower extremities and denies any changes in her bowel or bladder function.  She has continued on systemic therapy under the care and direction of Dr. Marin Olp and feels that she is tolerating this well overall.  ALLERGIES:  is allergic to penicillins, codeine, morphine, and tramadol.  Meds: Current Outpatient Medications  Medication Sig Dispense Refill   fluconazole (DIFLUCAN) 100 MG tablet Take 100 mg by mouth daily. (Patient not taking: Reported on 08/02/2022)     fluticasone (FLONASE) 50 MCG/ACT nasal spray Place 1 spray into both nostrils daily. (Patient not taking: Reported on 08/02/2022)     levothyroxine (SYNTHROID) 88 MCG tablet 1  TABLET EVERY MORNING ON AN EMPTY STOMACH ORALLY ONCE A DAY 90 DAYS     lisinopril-hydrochlorothiazide (ZESTORETIC) 10-12.5 MG tablet Take 1 tablet by mouth daily.     metoprolol tartrate (LOPRESSOR) 25 MG tablet Take 1 tablet (25 mg total) by mouth 2 (two) times daily. 60 tablet 1   Multiple Vitamin (MULTIVITAMIN WITH MINERALS) TABS tablet Take 1 tablet by mouth daily.     Oxycodone HCl 10 MG TABS Take 1 tablet (10 mg total) by mouth every 4 (four) hours as needed. 120 tablet 0   pantoprazole (PROTONIX) 40 MG tablet Take 1 tablet (40 mg total) by mouth 2 (two) times daily. 60 tablet 1   prochlorperazine (COMPAZINE) 10 MG tablet Take 1 tablet (10 mg total) by mouth every 6 (six) hours as needed for nausea or vomiting. (Patient not taking: Reported on 07/18/2022) 30 tablet 5   rosuvastatin (CRESTOR) 10 MG tablet TAKE 1 TABLET BY MOUTH EVERY DAY FOR 90 DAYS     senna (SENOKOT) 8.6 MG TABS tablet Take 1 tablet (8.6 mg total) by mouth daily as needed for mild constipation or moderate constipation. 30 tablet 0   No current facility-administered medications for this encounter.    Physical Findings:  vitals were not taken for this visit.  Pain Assessment Pain Score: 4  (Lower back)/10 Unable to assess due to telephone follow-up visit format.  Lab Findings: Lab Results  Component  Value Date   WBC 5.1 08/02/2022   HGB 9.6 (L) 08/02/2022   HCT 29.9 (L) 08/02/2022   MCV 85.2 08/02/2022   PLT 161 08/02/2022     Radiographic Findings: CT ABDOMEN PELVIS WO CONTRAST  Result Date: 07/18/2022 CLINICAL DATA:  Constipation EXAM: CT ABDOMEN AND PELVIS WITHOUT CONTRAST TECHNIQUE: Multidetector CT imaging of the abdomen and pelvis was performed following the standard protocol without IV contrast. RADIATION DOSE REDUCTION: This exam was performed according to the departmental dose-optimization program which includes automated exposure control, adjustment of the mA and/or kV according to patient size and/or  use of iterative reconstruction technique. COMPARISON:  PET/CT 05/13/2022 FINDINGS: Lower chest: Moderate-large right and small left pleural effusions and compressive atelectasis. Coronary artery calcification. Hepatobiliary: No focal liver abnormality is seen. No gallstones, gallbladder wall thickening, or biliary dilatation. Pancreas: Unremarkable. Spleen: Unremarkable. Adrenals/Urinary Tract: Adrenal glands are unremarkable. Kidneys are normal, without renal calculi, focal lesion, or hydronephrosis. Bladder is unremarkable. Stomach/Bowel: Stomach is unremarkable. Normal caliber large and small bowel. Normal appendix. Moderate colonic stool load. Dense stool within the distended rectum with surrounding fluid and rectal wall thickening. Mild adjacent fat stranding. Vascular/Lymphatic: Normal caliber abdominal aorta. No adenopathy by size. Reproductive: Uterus and bilateral adnexa are unremarkable. Other: No free intraperitoneal air. Musculoskeletal: Diffuse osseous lytic lesions compatible with multiple myeloma in the axial and appendicular skeleton. Multiple vertebral body compression fractures at T10, T12, L1, L2, L3, and L5 are age indeterminate. No definite retropulsion. IMPRESSION: Dense stool in the colon with colon wall thickening and mild adjacent stranding. Findings are compatible with fecal impaction and possible developing stercoral colitis. Moderate colonic stool burden. Diffuse osseous lytic lesions have progressed since 05/13/2022. Multiple pathologic vertebral body compression fractures in the thoracolumbar spine. No definite retropulsion. Moderate-large right and small left pleural effusions. Electronically Signed   By: Placido Sou M.D.   On: 07/18/2022 01:24    Impression/Plan: 1. 61 y/o female with painful bony metastases in the thoracolumbar spine felt most likely secondary to multiple myeloma, pathology pending.  She has recovered well from the effects of her recent palliative radiation  to the thoracolumbar spine and is currently without complaints.  Her back pain has improved since completing treatment and she has continued on systemic therapy under the care of of Dr. Marin Olp.  We discussed that while we are happy to continue to participate in her care if clinically indicated, at this point, we will plan to see her back on an as-needed basis.  She will continue in routine follow-up under the care and direction of Dr. Marin Olp for continued management of her systemic disease.  She knows that she is welcome to call anytime with any questions or concerns related to her previous radiation.    Nicholos Johns, PA-C

## 2022-08-08 ENCOUNTER — Ambulatory Visit
Admission: RE | Admit: 2022-08-08 | Discharge: 2022-08-08 | Disposition: A | Discharge status: Home / Self Care | Payer: BLUE CROSS/BLUE SHIELD | Source: Ambulatory Visit | Attending: Radiation Oncology | Admitting: Radiation Oncology

## 2022-08-08 DIAGNOSIS — C9000 Multiple myeloma not having achieved remission: Secondary | ICD-10-CM | POA: Insufficient documentation

## 2022-08-08 LAB — IMMUNOFIXATION REFLEX, SERUM
IgA: <5 mg/dL — ABNORMAL LOW (ref 87–352)
IgG (Immunoglobin G), Serum: 785 mg/dL (ref 586–1602)
IgM (Immunoglobulin M), Srm: 12 mg/dL — ABNORMAL LOW (ref 26–217)

## 2022-08-08 LAB — PROTEIN ELECTROPHORESIS, SERUM, WITH REFLEX
A/G Ratio: 1.5 (ref 0.7–1.7)
Albumin ELP: 3.5 g/dL (ref 2.9–4.4)
Alpha-1-Globulin: 0.3 g/dL (ref 0.0–0.4)
Alpha-2-Globulin: 0.8 g/dL (ref 0.4–1.0)
Beta Globulin: 0.7 g/dL (ref 0.7–1.3)
Gamma Globulin: 0.6 g/dL (ref 0.4–1.8)
Globulin, Total: 2.4 g/dL (ref 2.2–3.9)
M-Spike, %: 0.4 g/dL — ABNORMAL HIGH
SPEP Interpretation: 0
Total Protein ELP: 5.9 g/dL — ABNORMAL LOW (ref 6.0–8.5)

## 2022-08-09 ENCOUNTER — Inpatient Hospital Stay: Payer: BC Managed Care – PPO

## 2022-08-09 ENCOUNTER — Inpatient Hospital Stay: Payer: BC Managed Care – PPO | Attending: Hematology & Oncology

## 2022-08-09 VITALS — BP 119/59 | HR 100

## 2022-08-09 DIAGNOSIS — N1832 Chronic kidney disease, stage 3b: Secondary | ICD-10-CM | POA: Insufficient documentation

## 2022-08-09 DIAGNOSIS — Z5111 Encounter for antineoplastic chemotherapy: Secondary | ICD-10-CM | POA: Insufficient documentation

## 2022-08-09 DIAGNOSIS — C9000 Multiple myeloma not having achieved remission: Secondary | ICD-10-CM

## 2022-08-09 DIAGNOSIS — Z5112 Encounter for antineoplastic immunotherapy: Secondary | ICD-10-CM | POA: Insufficient documentation

## 2022-08-09 DIAGNOSIS — D631 Anemia in chronic kidney disease: Secondary | ICD-10-CM | POA: Insufficient documentation

## 2022-08-09 LAB — CMP (CANCER CENTER ONLY)
ALT: 13 U/L (ref 0–44)
AST: 21 U/L (ref 15–41)
Albumin: 4 g/dL (ref 3.5–5.0)
Alkaline Phosphatase: 242 U/L — ABNORMAL HIGH (ref 38–126)
Anion gap: 8 (ref 5–15)
BUN: 7 mg/dL — ABNORMAL LOW (ref 8–23)
CO2: 26 mmol/L (ref 22–32)
Calcium: 9.6 mg/dL (ref 8.9–10.3)
Chloride: 105 mmol/L (ref 98–111)
Creatinine: 1.63 mg/dL — ABNORMAL HIGH (ref 0.44–1.00)
GFR, Estimated: 35 mL/min — ABNORMAL LOW (ref >60)
Glucose, Bld: 114 mg/dL — ABNORMAL HIGH (ref 70–99)
Potassium: 3.6 mmol/L (ref 3.5–5.1)
Sodium: 139 mmol/L (ref 135–145)
Total Bilirubin: 0.4 mg/dL (ref 0.3–1.2)
Total Protein: 6.5 g/dL (ref 6.5–8.1)

## 2022-08-09 LAB — CBC WITH DIFFERENTIAL (CANCER CENTER ONLY)
Abs Immature Granulocytes: 0.03 10*3/uL (ref 0.00–0.07)
Basophils Absolute: 0 10*3/uL (ref 0.0–0.1)
Basophils Relative: 0 %
Eosinophils Absolute: 0.1 10*3/uL (ref 0.0–0.5)
Eosinophils Relative: 1 %
HCT: 30.9 % — ABNORMAL LOW (ref 36.0–46.0)
Hemoglobin: 9.9 g/dL — ABNORMAL LOW (ref 12.0–15.0)
Immature Granulocytes: 1 %
Lymphocytes Relative: 19 %
Lymphs Abs: 1.1 10*3/uL (ref 0.7–4.0)
MCH: 27.7 pg (ref 26.0–34.0)
MCHC: 32 g/dL (ref 30.0–36.0)
MCV: 86.6 fL (ref 80.0–100.0)
Monocytes Absolute: 1.6 10*3/uL — ABNORMAL HIGH (ref 0.1–1.0)
Monocytes Relative: 27 %
Neutro Abs: 3.1 10*3/uL (ref 1.7–7.7)
Neutrophils Relative %: 52 %
Platelet Count: 174 10*3/uL (ref 150–400)
RBC: 3.57 MIL/uL — ABNORMAL LOW (ref 3.87–5.11)
RDW: 18.7 % — ABNORMAL HIGH (ref 11.5–15.5)
WBC Count: 5.9 10*3/uL (ref 4.0–10.5)
nRBC: 0.5 % — ABNORMAL HIGH (ref 0.0–0.2)

## 2022-08-09 MED ORDER — SODIUM CHLORIDE 0.9 % IV SOLN: Freq: Once | INTRAVENOUS | Status: AC

## 2022-08-09 MED ORDER — ACETAMINOPHEN 325 MG PO TABS
650.0000 mg | ORAL_TABLET | Freq: Once | ORAL | Status: AC
  Administered 2022-08-09: 650 mg via ORAL
  Filled 2022-08-09: qty 2

## 2022-08-09 MED ORDER — SODIUM CHLORIDE 0.9 % IV SOLN: Freq: Once | INTRAVENOUS | Status: DC

## 2022-08-09 MED ORDER — BORTEZOMIB CHEMO SQ INJECTION 3.5 MG (2.5MG/ML)
1.3000 mg/m2 | Freq: Once | INTRAMUSCULAR | Status: AC
  Administered 2022-08-09: 2.75 mg via SUBCUTANEOUS
  Filled 2022-08-09: qty 1.1

## 2022-08-09 MED ORDER — DARBEPOETIN ALFA 300 MCG/0.6ML IJ SOSY
300.0000 ug | PREFILLED_SYRINGE | Freq: Once | INTRAMUSCULAR | Status: AC
  Administered 2022-08-09: 300 ug via SUBCUTANEOUS
  Filled 2022-08-09: qty 0.6

## 2022-08-09 MED ORDER — DARATUMUMAB-HYALURONIDASE-FIHJ 1800-30000 MG-UT/15ML ~~LOC~~ SOLN
1800.0000 mg | Freq: Once | SUBCUTANEOUS | Status: AC
  Administered 2022-08-09: 1800 mg via SUBCUTANEOUS
  Filled 2022-08-09: qty 15

## 2022-08-09 MED ORDER — SODIUM CHLORIDE 0.9 % IV SOLN
20.0000 mg | Freq: Once | INTRAVENOUS | Status: AC
  Administered 2022-08-09: 20 mg via INTRAVENOUS
  Filled 2022-08-09: qty 20

## 2022-08-09 MED ORDER — DIPHENHYDRAMINE HCL 25 MG PO CAPS
50.0000 mg | ORAL_CAPSULE | Freq: Once | ORAL | Status: AC
  Administered 2022-08-09: 50 mg via ORAL
  Filled 2022-08-09: qty 2

## 2022-08-09 NOTE — Patient Instructions (Signed)
Bortezomib Injection What is this medication? BORTEZOMIB (bor TEZ oh mib) treats lymphoma. It may also be used to treat multiple myeloma, a type of bone marrow cancer. It works by blocking a protein that causes cancer cells to grow and multiply. This helps to slow or stop the spread of cancer cells. This medicine may be used for other purposes; ask your health care provider or pharmacist if you have questions. COMMON BRAND NAME(S): Velcade What should I tell my care team before I take this medication? They need to know if you have any of these conditions: Dehydration Diabetes Heart disease Liver disease Tingling of the fingers or toes or other nerve disorder An unusual or allergic reaction to bortezomib, other medications, foods, dyes, or preservatives If you or your partner are pregnant or trying to get pregnant Breastfeeding How should I use this medication? This medication is injected into a vein or under the skin. It is given by your care team in a hospital or clinic setting. Talk to your care team about the use of this medication in children. Special care may be needed. Overdosage: If you think you have taken too much of this medicine contact a poison control center or emergency room at once. NOTE: This medicine is only for you. Do not share this medicine with others. What if I miss a dose? Keep appointments for follow-up doses. It is important not to miss your dose. Call your care team if you are unable to keep an appointment. What may interact with this medication? Ketoconazole Rifampin This list may not describe all possible interactions. Give your health care provider a list of all the medicines, herbs, non-prescription drugs, or dietary supplements you use. Also tell them if you smoke, drink alcohol, or use illegal drugs. Some items may interact with your medicine. What should I watch for while using this medication? Your condition will be monitored carefully while you are  receiving this medication. You may need blood work while taking this medication. This medication may affect your coordination, reaction time, or judgment. Do not drive or operate machinery until you know how this medication affects you. Sit up or stand slowly to reduce the risk of dizzy or fainting spells. Drinking alcohol with this medication can increase the risk of these side effects. This medication may increase your risk of getting an infection. Call your care team for advice if you get a fever, chills, sore throat, or other symptoms of a cold or flu. Do not treat yourself. Try to avoid being around people who are sick. Check with your care team if you have severe diarrhea, nausea, and vomiting, or if you sweat a lot. The loss of too much body fluid may make it dangerous for you to take this medication. Talk to your care team if you may be pregnant. Serious birth defects can occur if you take this medication during pregnancy and for 7 months after the last dose. You will need a negative pregnancy test before starting this medication. Contraception is recommended while taking this medication and for 7 months after the last dose. Your care team can help you find the option that works for you. If your partner can get pregnant, use a condom during sex while taking this medication and for 4 months after the last dose. Do not breastfeed while taking this medication and for 2 months after the last dose. This medication may cause infertility. Talk to your care team if you are concerned about your fertility. What side effects   may I notice from receiving this medication? Side effects that you should report to your care team as soon as possible: Allergic reactions--skin rash, itching, hives, swelling of the face, lips, tongue, or throat Bleeding--bloody or black, tar-like stools, vomiting blood or brown material that looks like coffee grounds, red or dark brown urine, small red or purple spots on skin, unusual  bruising or bleeding Bleeding in the brain--severe headache, stiff neck, confusion, dizziness, change in vision, numbness or weakness of the face, arm, or leg, trouble speaking, trouble walking, vomiting Bowel blockage--stomach cramping, unable to have a bowel movement or pass gas, loss of appetite, vomiting Heart failure--shortness of breath, swelling of the ankles, feet, or hands, sudden weight gain, unusual weakness or fatigue Infection--fever, chills, cough, sore throat, wounds that don't heal, pain or trouble when passing urine, general feeling of discomfort or being unwell Liver injury--right upper belly pain, loss of appetite, nausea, light-colored stool, dark yellow or brown urine, yellowing skin or eyes, unusual weakness or fatigue Low blood pressure--dizziness, feeling faint or lightheaded, blurry vision Lung injury--shortness of breath or trouble breathing, cough, spitting up blood, chest pain, fever Pain, tingling, or numbness in the hands or feet Severe or prolonged diarrhea Stomach pain, bloody diarrhea, pale skin, unusual weakness or fatigue, decrease in the amount of urine, which may be signs of hemolytic uremic syndrome Sudden and severe headache, confusion, change in vision, seizures, which may be signs of posterior reversible encephalopathy syndrome (PRES) TTP--purple spots on the skin or inside the mouth, pale skin, yellowing skin or eyes, unusual weakness or fatigue, fever, fast or irregular heartbeat, confusion, change in vision, trouble speaking, trouble walking Tumor lysis syndrome (TLS)--nausea, vomiting, diarrhea, decrease in the amount of urine, dark urine, unusual weakness or fatigue, confusion, muscle pain or cramps, fast or irregular heartbeat, joint pain Side effects that usually do not require medical attention (report to your care team if they continue or are bothersome): Constipation Diarrhea Fatigue Loss of appetite Nausea This list may not describe all possible  side effects. Call your doctor for medical advice about side effects. You may report side effects to FDA at 1-800-FDA-1088. Where should I keep my medication? This medication is given in a hospital or clinic. It will not be stored at home. NOTE: This sheet is a summary. It may not cover all possible information. If you have questions about this medicine, talk to your doctor, pharmacist, or health care provider.  2023 Elsevier/Gold Standard (2022-03-20 00:00:00)  Daratumumab Injection What is this medication? DARATUMUMAB (dar a toom ue mab) treats multiple myeloma, a type of bone marrow cancer. It works by helping your immune system slow or stop the spread of cancer cells. It is a monoclonal antibody. This medicine may be used for other purposes; ask your health care provider or pharmacist if you have questions. COMMON BRAND NAME(S): DARZALEX What should I tell my care team before I take this medication? They need to know if you have any of these conditions: Hereditary fructose intolerance Infection, such as chickenpox, herpes, hepatitis B virus Lung or breathing disease, such as asthma, COPD An unusual or allergic reaction to daratumumab, sorbitol, other medications, foods, dyes, or preservatives Pregnant or trying to get pregnant Breast-feeding How should I use this medication? This medication is injected into a vein. It is given by your care team in a hospital or clinic setting. Talk to your care team about the use of this medication in children. Special care may be needed. Overdosage: If you think  you have taken too much of this medicine contact a poison control center or emergency room at once. NOTE: This medicine is only for you. Do not share this medicine with others. What if I miss a dose? Keep appointments for follow-up doses. It is important not to miss your dose. Call your care team if you are unable to keep an appointment. What may interact with this medication? Interactions  have not been studied. This list may not describe all possible interactions. Give your health care provider a list of all the medicines, herbs, non-prescription drugs, or dietary supplements you use. Also tell them if you smoke, drink alcohol, or use illegal drugs. Some items may interact with your medicine. What should I watch for while using this medication? Your condition will be monitored carefully while you are receiving this medication. This medication can cause serious allergic reactions. To reduce your risk, your care team may give you other medication to take before receiving this one. Be sure to follow the directions from your care team. This medication can affect the results of blood tests to match your blood type. These changes can last for up to 6 months after the final dose. Your care team will do blood tests to match your blood type before you start treatment. Tell all of your care team that you are being treated with this medication before receiving a blood transfusion. This medication can affect the results of some tests used to determine treatment response; extra tests may be needed to evaluate response. Talk to your care team if you wish to become pregnant or think you are pregnant. This medication can cause serious birth defects if taken during pregnancy and for 3 months after the last dose. A reliable form of contraception is recommended while taking this medication and for 3 months after the last dose. Talk to your care team about effective forms of contraception. Do not breast-feed while taking this medication. What side effects may I notice from receiving this medication? Side effects that you should report to your care team as soon as possible: Allergic reactions--skin rash, itching, hives, swelling of the face, lips, tongue, or throat Infection--fever, chills, cough, sore throat, wounds that don't heal, pain or trouble when passing urine, general feeling of discomfort or being  unwell Infusion reactions--chest pain, shortness of breath or trouble breathing, feeling faint or lightheaded Unusual bruising or bleeding Side effects that usually do not require medical attention (report to your care team if they continue or are bothersome): Constipation Diarrhea Fatigue Nausea Pain, tingling, or numbness in the hands or feet Swelling of the ankles, hands, or feet This list may not describe all possible side effects. Call your doctor for medical advice about side effects. You may report side effects to FDA at 1-800-FDA-1088. Where should I keep my medication? This medication is given in a hospital or clinic. It will not be stored at home. NOTE: This sheet is a summary. It may not cover all possible information. If you have questions about this medicine, talk to your doctor, pharmacist, or health care provider.  2023 Elsevier/Gold Standard (2014-09-19 00:00:00)  Darbepoetin Alfa Injection What is this medication? DARBEPOETIN ALFA (dar be POE e tin AL fa) treats low levels of red blood cells (anemia) caused by kidney disease or chemotherapy. It works by Building control surveyor make more red blood cells, which reduces the need for blood transfusions. This medicine may be used for other purposes; ask your health care provider or pharmacist if you have  questions. COMMON BRAND NAME(S): Aranesp What should I tell my care team before I take this medication? They need to know if you have any of these conditions: Blood clots Cancer Heart disease High blood pressure On dialysis Seizures Stroke An unusual or allergic reaction to darbepoetin, latex, other medications, foods, dyes, or preservatives Pregnant or trying to get pregnant Breast-feeding How should I use this medication? This medication is injected into a vein or under the skin. It is usually given by a care team in a hospital or clinic setting. It may also be given at home. If you get this medication at home, you will be  taught how to prepare and give it. Use exactly as directed. Take it as directed on the prescription label at the same time every day. Keep taking it unless your care team tells you to stop. It is important that you put your used needles and syringes in a special sharps container. Do not put them in a trash can. If you do not have a sharps container, call your pharmacist or care team to get one. A special MedGuide will be given to you by the pharmacist with each prescription and refill. Be sure to read this information carefully each time. Talk to your care team about the use of this medication in children. While this medication may be used in children as young as 15 month of age for selected conditions, precautions do apply. Overdosage: If you think you have taken too much of this medicine contact a poison control center or emergency room at once. NOTE: This medicine is only for you. Do not share this medicine with others. What if I miss a dose? If you miss a dose, take it as soon as you can. If it is almost time for your next dose, take only that dose. Do not take double or extra doses. What may interact with this medication? Epoetin alfa Methoxy polyethylene glycol-epoetin beta This list may not describe all possible interactions. Give your health care provider a list of all the medicines, herbs, non-prescription drugs, or dietary supplements you use. Also tell them if you smoke, drink alcohol, or use illegal drugs. Some items may interact with your medicine. What should I watch for while using this medication? Visit your care team for regular checks on your progress. Check your blood pressure as directed. Know what your blood pressure should be and when to contact your care team. Your condition will be monitored carefully while you are receiving this medication. You may need blood work while taking this medication. What side effects may I notice from receiving this medication? Side effects that you  should report to your care team as soon as possible: Allergic reactions--skin rash, itching, hives, swelling of the face, lips, tongue, or throat Blood clot--pain, swelling, or warmth in the leg, shortness of breath, chest pain Heart attack--pain or tightness in the chest, shoulders, arms, or jaw, nausea, shortness of breath, cold or clammy skin, feeling faint or lightheaded Increase in blood pressure Rash, fever, and swollen lymph nodes Redness, blistering, peeling, or loosening of the skin, including inside the mouth Seizures Stroke--sudden numbness or weakness of the face, arm, or leg, trouble speaking, confusion, trouble walking, loss of balance or coordination, dizziness, severe headache, change in vision Side effects that usually do not require medical attention (report to your care team if they continue or are bothersome): Cough Stomach pain Swelling of the ankles, hands, or feet This list may not describe all possible side effects.  Call your doctor for medical advice about side effects. You may report side effects to FDA at 1-800-FDA-1088. Where should I keep my medication? Keep out of the reach of children and pets. Store in a refrigerator. Do not freeze. Do not shake. Protect from light. Keep this medication in the original container until you are ready to take it. See product for storage information. Get rid of any unused medication after the expiration date. To get rid of medications that are no longer needed or have expired: Take the medication to a medication take-back program. Check with your pharmacy or law enforcement to find a location. If you cannot return the medication, ask your pharmacist or care team how to get rid of the medication safely. NOTE: This sheet is a summary. It may not cover all possible information. If you have questions about this medicine, talk to your doctor, pharmacist, or health care provider.  2023 Elsevier/Gold Standard (2022-01-29 00:00:00)

## 2022-08-09 NOTE — Progress Notes (Signed)
Cbc and cmet reviewed by MD ok to treat despite counts

## 2022-08-09 NOTE — Patient Instructions (Signed)

## 2022-08-13 ENCOUNTER — Ambulatory Visit: Payer: BC Managed Care – PPO | Admitting: Dietician

## 2022-08-13 ENCOUNTER — Telehealth: Payer: Self-pay

## 2022-08-13 NOTE — Progress Notes (Signed)
Nutrition Follow Up:  Spoke with patient who reports she hasn't had a bowel movement since last week. She has a call into Dr. Antonieta Pert office.  She hasn't taking any stool softener or MiraLAX this morning but reports she did take both yesterday.  She is very uncomfortable, doesn't have any enema or suppositories at home.  She reports she has increased her activity but not her fluids since we last spoke.  All she has had today was cream of wheat and hot tea and trying to get a bottle of water down now.  I encouraged her to try for goal 80-100 oz fluid continuing meds and discussing use of enemas w/doctor if she hasn't moved her bowels at least every 2-3 days.  Will follow up remotely.  April Manson, RDN, LDN Registered Dietitian, [facility] Part Time Remote (Usual office hours: Tuesday-Thursday) Mobile: [phone] Remote Office: [phone]

## 2022-08-13 NOTE — Telephone Encounter (Signed)
Patient called stating she ahs been constipated since tuesday. Called and talked with patient, she states she hasnt taken anything. Inquired if she picked up the lactulose that was sent on 10/4, she states she did not and would go get that today. Pt verbalized all understanding and denies any other questions or concerns. She understands to call if this doesn't relieve her constipation.

## 2022-08-14 ENCOUNTER — Encounter (HOSPITAL_COMMUNITY): Payer: Self-pay

## 2022-08-14 ENCOUNTER — Emergency Department (HOSPITAL_COMMUNITY)
Admission: EM | Admit: 2022-08-14 | Discharge: 2022-08-14 | Disposition: A | Discharge status: Home / Self Care | Payer: BC Managed Care – PPO | Attending: Emergency Medicine | Admitting: Emergency Medicine

## 2022-08-14 ENCOUNTER — Other Ambulatory Visit: Payer: Self-pay

## 2022-08-14 ENCOUNTER — Encounter: Payer: Self-pay | Admitting: Hematology & Oncology

## 2022-08-14 DIAGNOSIS — K5900 Constipation, unspecified: Secondary | ICD-10-CM | POA: Diagnosis present

## 2022-08-14 DIAGNOSIS — K644 Residual hemorrhoidal skin tags: Secondary | ICD-10-CM | POA: Insufficient documentation

## 2022-08-14 DIAGNOSIS — M549 Dorsalgia, unspecified: Secondary | ICD-10-CM | POA: Insufficient documentation

## 2022-08-14 MED ORDER — BISACODYL 10 MG RE SUPP
10.0000 mg | Freq: Once | RECTAL | Status: AC
  Administered 2022-08-14: 10 mg via RECTAL
  Filled 2022-08-14: qty 1

## 2022-08-14 NOTE — ED Provider Notes (Signed)
[facility] DEPT Provider Note   CSN: [id number] Arrival date & time: 08/14/22  7209     History  Chief Complaint  Patient presents with   Constipation   Back Pain    Michelle [name] is a 61 y.o. female.  61 year old female presents with constipation x1 week.  Denies any emesis or fever.  Some abdominal distention.  Notes that she is on opiate medications.  Has been using over-the-counter medication without relief.       Home Medications Prior to Admission medications   Medication Sig Start Date End Date Taking? Authorizing Provider  fluconazole (DIFLUCAN) 100 MG tablet Take 100 mg by mouth daily. Patient not taking: Reported on 08/02/2022 07/28/22   [provider]  fluticasone (FLONASE) 50 MCG/ACT nasal spray Place 1 spray into both nostrils daily. Patient not taking: Reported on 08/02/2022 07/26/22   [provider]  lactulose (CHRONULAC) 10 GM/15ML solution TAKE 15 MLS (10 G TOTAL) BY MOUTH 2 (TWO) TIMES DAILY. 08/07/22   Volanda Napoleon, MD  levothyroxine (SYNTHROID) 88 MCG tablet 1 TABLET EVERY MORNING ON AN EMPTY STOMACH ORALLY ONCE A DAY 90 DAYS    [provider]  lisinopril-hydrochlorothiazide (ZESTORETIC) 10-12.5 MG tablet Take 1 tablet by mouth daily. 07/29/22   [provider]  metoprolol tartrate (LOPRESSOR) 25 MG tablet Take 1 tablet (25 mg total) by mouth 2 (two) times daily. 06/26/22   Hongalgi, Lenis Dickinson, MD  Multiple Vitamin (MULTIVITAMIN WITH MINERALS) TABS tablet Take 1 tablet by mouth daily. 06/27/22   Hongalgi, Lenis Dickinson, MD  Oxycodone HCl 10 MG TABS Take 1 tablet (10 mg total) by mouth every 4 (four) hours as needed. 08/02/22   Volanda Napoleon, MD  pantoprazole (PROTONIX) 40 MG tablet Take 1 tablet (40 mg total) by mouth 2 (two) times daily. 06/26/22   Hongalgi, Lenis Dickinson, MD  prochlorperazine (COMPAZINE) 10 MG tablet Take 1 tablet (10 mg total) by mouth every 6 (six) hours as needed for nausea or  vomiting. Patient not taking: Reported on 07/18/2022 06/14/22   Tyler Pita, MD  rosuvastatin (CRESTOR) 10 MG tablet TAKE 1 TABLET BY MOUTH EVERY DAY FOR 90 DAYS    [provider]  senna (SENOKOT) 8.6 MG TABS tablet Take 1 tablet (8.6 mg total) by mouth daily as needed for mild constipation or moderate constipation. 06/26/22   Modena Jansky, MD      Allergies    Penicillins, Codeine, Morphine, and Tramadol    Review of Systems   Review of Systems  All other systems reviewed and are negative.   Physical Exam Updated Vital Signs BP (!) 155/97 (BP Location: Left Arm)   Pulse 99   Temp 97.9 F (36.6 C) (Oral)   Resp 18   SpO2 100%  Physical Exam Vitals and nursing note reviewed. Exam conducted with a chaperone present.  Constitutional:      General: She is not in acute distress.    Appearance: Normal appearance. She is well-developed. She is not toxic-appearing.  HENT:     Head: Normocephalic and atraumatic.  Eyes:     General: Lids are normal.     Conjunctiva/sclera: Conjunctivae normal.     Pupils: Pupils are equal, round, and reactive to light.  Neck:     Thyroid: No thyroid mass.     Trachea: No tracheal deviation.  Cardiovascular:     Rate and Rhythm: Normal rate and regular rhythm.     Heart sounds: Normal  heart sounds. No murmur heard.    No gallop.  Pulmonary:     Effort: Pulmonary effort is normal. No respiratory distress.     Breath sounds: Normal breath sounds. No stridor. No decreased breath sounds, wheezing, rhonchi or rales.  Abdominal:     General: There is no distension.     Palpations: Abdomen is soft.     Tenderness: There is no abdominal tenderness. There is no rebound.  Genitourinary:    Rectum: External hemorrhoid present.     Comments: Firm stool noted in rectal vault Musculoskeletal:        General: No tenderness. Normal range of motion.     Cervical back: Normal range of motion and neck supple.  Skin:    General: Skin is warm  and dry.     Findings: No abrasion or rash.  Neurological:     Mental Status: She is alert and oriented to person, place, and time. Mental status is at baseline.     GCS: GCS eye subscore is 4. GCS verbal subscore is 5. GCS motor subscore is 6.     Cranial Nerves: No cranial nerve deficit.     Sensory: No sensory deficit.     Motor: Motor function is intact.  Psychiatric:        Attention and Perception: Attention normal.        Speech: Speech normal.        Behavior: Behavior normal.     ED Results / Procedures / Treatments   Labs (all labs ordered are listed, but only abnormal results are displayed) Labs Reviewed - No data to display  EKG None  Radiology No results found.  Procedures Fecal disimpaction  Date/Time: 08/14/2022 2:16 PM  Performed by: Lacretia Leigh, MD Authorized by: Lacretia Leigh, MD  Consent: Verbal consent obtained. Time out: Immediately prior to procedure a "time out" was called to verify the correct patient, procedure, equipment, support staff and site/side marked as required.  Sedation: Patient sedated: no  Patient tolerance: patient tolerated the procedure well with no immediate complications       Medications Ordered in ED Medications - No data to display  ED Course/ Medical Decision Making/ A&P                           Medical Decision Making Risk OTC drugs.   Copious amount of stool obtained from patient's rectal vault.  Patient attempted to have BM unsuccessfully.  She then had glycerin suppositories applied without success.  She subsequently had an fleets enema and had good results and feels better at this time.  She has no evidence of obstruction at this time.  We discharged home.  Family at bedside and agreeable to this        Final Clinical Impression(s) / ED Diagnoses Final diagnoses:  None    Rx / DC Orders ED Discharge Orders     None         Lacretia Leigh, MD 08/14/22 1418

## 2022-08-14 NOTE — ED Triage Notes (Signed)
EMS reports from home, c/o constipation and back pain since Tuesday. CA Pt in treatment. Takes Oxy for pain. Took Miralax and Senokot yesterday.  BP 146/92 HR 100 RR 18 Sp02 98 RA

## 2022-08-14 NOTE — ED Notes (Signed)
Pt had a small bowel movement in [facility]. Assisted pt in cleaning up and getting back in to bed.

## 2022-08-16 ENCOUNTER — Inpatient Hospital Stay: Payer: BC Managed Care – PPO

## 2022-08-16 ENCOUNTER — Telehealth: Payer: Self-pay | Admitting: *Deleted

## 2022-08-16 VITALS — BP 128/74 | HR 89 | Temp 98.1°F | Resp 18

## 2022-08-16 DIAGNOSIS — C9000 Multiple myeloma not having achieved remission: Secondary | ICD-10-CM

## 2022-08-16 DIAGNOSIS — Z5111 Encounter for antineoplastic chemotherapy: Secondary | ICD-10-CM | POA: Diagnosis not present

## 2022-08-16 LAB — CBC WITH DIFFERENTIAL (CANCER CENTER ONLY)
Abs Immature Granulocytes: 0.02 10*3/uL (ref 0.00–0.07)
Basophils Absolute: 0 10*3/uL (ref 0.0–0.1)
Basophils Relative: 0 %
Eosinophils Absolute: 0.1 10*3/uL (ref 0.0–0.5)
Eosinophils Relative: 1 %
HCT: 32.2 % — ABNORMAL LOW (ref 36.0–46.0)
Hemoglobin: 10.3 g/dL — ABNORMAL LOW (ref 12.0–15.0)
Immature Granulocytes: 0 %
Lymphocytes Relative: 19 %
Lymphs Abs: 1.2 10*3/uL (ref 0.7–4.0)
MCH: 28.1 pg (ref 26.0–34.0)
MCHC: 32 g/dL (ref 30.0–36.0)
MCV: 87.7 fL (ref 80.0–100.0)
Monocytes Absolute: 1.4 10*3/uL — ABNORMAL HIGH (ref 0.1–1.0)
Monocytes Relative: 23 %
Neutro Abs: 3.5 10*3/uL (ref 1.7–7.7)
Neutrophils Relative %: 57 %
Platelet Count: 146 10*3/uL — ABNORMAL LOW (ref 150–400)
RBC: 3.67 MIL/uL — ABNORMAL LOW (ref 3.87–5.11)
RDW: 21 % — ABNORMAL HIGH (ref 11.5–15.5)
WBC Count: 6.2 10*3/uL (ref 4.0–10.5)
nRBC: 0.5 % — ABNORMAL HIGH (ref 0.0–0.2)

## 2022-08-16 LAB — CMP (CANCER CENTER ONLY)
ALT: 11 U/L (ref 0–44)
AST: 20 U/L (ref 15–41)
Albumin: 3.9 g/dL (ref 3.5–5.0)
Alkaline Phosphatase: 219 U/L — ABNORMAL HIGH (ref 38–126)
Anion gap: 8 (ref 5–15)
BUN: 5 mg/dL — ABNORMAL LOW (ref 8–23)
CO2: 25 mmol/L (ref 22–32)
Calcium: 9.4 mg/dL (ref 8.9–10.3)
Chloride: 105 mmol/L (ref 98–111)
Creatinine: 1.61 mg/dL — ABNORMAL HIGH (ref 0.44–1.00)
GFR, Estimated: 36 mL/min — ABNORMAL LOW (ref >60)
Glucose, Bld: 107 mg/dL — ABNORMAL HIGH (ref 70–99)
Potassium: 3.4 mmol/L — ABNORMAL LOW (ref 3.5–5.1)
Sodium: 138 mmol/L (ref 135–145)
Total Bilirubin: 0.6 mg/dL (ref 0.3–1.2)
Total Protein: 6.1 g/dL — ABNORMAL LOW (ref 6.5–8.1)

## 2022-08-16 MED ORDER — SODIUM CHLORIDE 0.9% FLUSH: 10.0000 mL | Freq: Once | INTRAVENOUS | Status: DC

## 2022-08-16 MED ORDER — SODIUM CHLORIDE 0.9 % IV SOLN
400.0000 mg | Freq: Once | INTRAVENOUS | Status: AC
  Administered 2022-08-16: 400 mg via INTRAVENOUS
  Filled 2022-08-16: qty 20

## 2022-08-16 MED ORDER — HEPARIN SOD (PORK) LOCK FLUSH 100 UNIT/ML IV SOLN
500.0000 [IU] | Freq: Once | INTRAVENOUS | Status: AC | PRN
  Administered 2022-08-16: 500 [IU]

## 2022-08-16 MED ORDER — DIPHENHYDRAMINE HCL 25 MG PO CAPS
50.0000 mg | ORAL_CAPSULE | Freq: Once | ORAL | Status: AC
  Administered 2022-08-16: 50 mg via ORAL
  Filled 2022-08-16: qty 2

## 2022-08-16 MED ORDER — ACETAMINOPHEN 325 MG PO TABS
650.0000 mg | ORAL_TABLET | Freq: Once | ORAL | Status: AC
  Administered 2022-08-16: 650 mg via ORAL
  Filled 2022-08-16: qty 2

## 2022-08-16 MED ORDER — SODIUM CHLORIDE 0.9 % IV SOLN: Freq: Once | INTRAVENOUS | Status: AC

## 2022-08-16 MED ORDER — DARATUMUMAB-HYALURONIDASE-FIHJ 1800-30000 MG-UT/15ML ~~LOC~~ SOLN
1800.0000 mg | Freq: Once | SUBCUTANEOUS | Status: AC
  Administered 2022-08-16: 1800 mg via SUBCUTANEOUS
  Filled 2022-08-16: qty 15

## 2022-08-16 MED ORDER — SODIUM CHLORIDE 0.9% FLUSH
10.0000 mL | INTRAVENOUS | Status: DC | PRN
  Administered 2022-08-16: 10 mL

## 2022-08-16 MED ORDER — BORTEZOMIB CHEMO SQ INJECTION 3.5 MG (2.5MG/ML)
1.3000 mg/m2 | Freq: Once | INTRAMUSCULAR | Status: AC
  Administered 2022-08-16: 2.75 mg via SUBCUTANEOUS
  Filled 2022-08-16: qty 1.1

## 2022-08-16 MED ORDER — SODIUM CHLORIDE 0.9 % IV SOLN
20.0000 mg | Freq: Once | INTRAVENOUS | Status: AC
  Administered 2022-08-16: 20 mg via INTRAVENOUS
  Filled 2022-08-16: qty 20

## 2022-08-16 NOTE — Patient Instructions (Signed)
[facility]  Discharge Instructions: Thank you for choosing [facility] to provide your oncology and hematology care.   If you have a lab appointment with the [facility], please go directly to the [facility] and check in at the registration area.  Wear comfortable clothing and clothing appropriate for easy access to any Portacath or PICC line.   We strive to give you quality time with your provider. You may need to reschedule your appointment if you arrive late (15 or more minutes).  Arriving late affects you and other patients whose appointments are after yours.  Also, if you miss three or more appointments without notifying the office, you may be dismissed from the clinic at the provider's discretion.      For prescription refill requests, have your pharmacy contact our office and allow 72 hours for refills to be completed.    Today you received the following chemotherapy and/or immunotherapy agents Velcade, Faspro and Cytoxan       To help prevent nausea and vomiting after your treatment, we encourage you to take your nausea medication as directed.  BELOW ARE SYMPTOMS THAT SHOULD BE REPORTED IMMEDIATELY: *FEVER GREATER THAN 100.4 F (38 C) OR HIGHER *CHILLS OR SWEATING *NAUSEA AND VOMITING THAT IS NOT CONTROLLED WITH YOUR NAUSEA MEDICATION *UNUSUAL SHORTNESS OF BREATH *UNUSUAL BRUISING OR BLEEDING *URINARY PROBLEMS (pain or burning when urinating, or frequent urination) *BOWEL PROBLEMS (unusual diarrhea, constipation, pain near the anus) TENDERNESS IN MOUTH AND THROAT WITH OR WITHOUT PRESENCE OF ULCERS (sore throat, sores in mouth, or a toothache) UNUSUAL RASH, SWELLING OR PAIN  UNUSUAL VAGINAL DISCHARGE OR ITCHING   Items with * indicate a potential emergency and should be followed up as soon as possible or go to the Emergency Department if any problems should occur.  Please show the CHEMOTHERAPY ALERT CARD or IMMUNOTHERAPY ALERT CARD  at check-in to the Emergency Department and triage nurse. Should you have questions after your visit or need to cancel or reschedule your appointment, please contact [facility]  [phone] and follow the prompts.  Office hours are 8:00 a.m. to 4:30 p.m. Monday - Friday. Please note that voicemails left after 4:00 p.m. may not be returned until the following business day.  We are closed weekends and major holidays. You have access to a nurse at all times for urgent questions. Please call the main number to the clinic [phone] and follow the prompts.  For any non-urgent questions, you may also contact your provider using MyChart. We now offer e-Visits for anyone 37 and older to request care online for non-urgent symptoms. For details visit [email or web address].   Also download the MyChart app! Go to the app store, search "MyChart", open the app, select [facility], and log in with your MyChart username and password.  Masks are optional in the cancer centers. If you would like for your care team to wear a mask while they are taking care of you, please let them know. You may have one support person who is at least 63 years old accompany you for your appointments.

## 2022-08-16 NOTE — Progress Notes (Signed)
Reviewed labs with Dr. Marin Olp and pt ok to treat with 1.6

## 2022-08-16 NOTE — Telephone Encounter (Signed)
Handicapp ppwk given to pt to take to [facility]

## 2022-08-16 NOTE — Patient Instructions (Signed)

## 2022-08-20 ENCOUNTER — Other Ambulatory Visit: Payer: Self-pay

## 2022-08-23 ENCOUNTER — Inpatient Hospital Stay: Payer: BC Managed Care – PPO

## 2022-08-23 VITALS — BP 124/75 | HR 84 | Temp 98.4°F | Resp 18

## 2022-08-23 DIAGNOSIS — C9000 Multiple myeloma not having achieved remission: Secondary | ICD-10-CM

## 2022-08-23 DIAGNOSIS — Z95828 Presence of other vascular implants and grafts: Secondary | ICD-10-CM

## 2022-08-23 DIAGNOSIS — Z5111 Encounter for antineoplastic chemotherapy: Secondary | ICD-10-CM | POA: Diagnosis not present

## 2022-08-23 LAB — CBC WITH DIFFERENTIAL (CANCER CENTER ONLY)
Abs Immature Granulocytes: 0.01 10*3/uL (ref 0.00–0.07)
Basophils Absolute: 0 10*3/uL (ref 0.0–0.1)
Basophils Relative: 0 %
Eosinophils Absolute: 0.1 10*3/uL (ref 0.0–0.5)
Eosinophils Relative: 1 %
HCT: 34.3 % — ABNORMAL LOW (ref 36.0–46.0)
Hemoglobin: 10.9 g/dL — ABNORMAL LOW (ref 12.0–15.0)
Immature Granulocytes: 0 %
Lymphocytes Relative: 28 %
Lymphs Abs: 1.2 10*3/uL (ref 0.7–4.0)
MCH: 28.5 pg (ref 26.0–34.0)
MCHC: 31.8 g/dL (ref 30.0–36.0)
MCV: 89.8 fL (ref 80.0–100.0)
Monocytes Absolute: 1.2 10*3/uL — ABNORMAL HIGH (ref 0.1–1.0)
Monocytes Relative: 27 %
Neutro Abs: 1.9 10*3/uL (ref 1.7–7.7)
Neutrophils Relative %: 44 %
Platelet Count: 107 10*3/uL — ABNORMAL LOW (ref 150–400)
RBC: 3.82 MIL/uL — ABNORMAL LOW (ref 3.87–5.11)
RDW: 21.4 % — ABNORMAL HIGH (ref 11.5–15.5)
WBC Count: 4.4 10*3/uL (ref 4.0–10.5)
nRBC: 0 % (ref 0.0–0.2)

## 2022-08-23 MED ORDER — DIPHENHYDRAMINE HCL 25 MG PO CAPS
50.0000 mg | ORAL_CAPSULE | Freq: Once | ORAL | Status: AC
  Administered 2022-08-23: 50 mg via ORAL
  Filled 2022-08-23: qty 2

## 2022-08-23 MED ORDER — DEXAMETHASONE 4 MG PO TABS
20.0000 mg | ORAL_TABLET | Freq: Once | ORAL | Status: AC
  Administered 2022-08-23: 20 mg via ORAL
  Filled 2022-08-23: qty 5

## 2022-08-23 MED ORDER — HEPARIN SOD (PORK) LOCK FLUSH 100 UNIT/ML IV SOLN
500.0000 [IU] | Freq: Once | INTRAVENOUS | Status: AC
  Administered 2022-08-23: 500 [IU] via INTRAVENOUS

## 2022-08-23 MED ORDER — DARATUMUMAB-HYALURONIDASE-FIHJ 1800-30000 MG-UT/15ML ~~LOC~~ SOLN
1800.0000 mg | Freq: Once | SUBCUTANEOUS | Status: AC
  Administered 2022-08-23: 1800 mg via SUBCUTANEOUS
  Filled 2022-08-23: qty 15

## 2022-08-23 MED ORDER — ACETAMINOPHEN 325 MG PO TABS
650.0000 mg | ORAL_TABLET | Freq: Once | ORAL | Status: AC
  Administered 2022-08-23: 650 mg via ORAL
  Filled 2022-08-23: qty 2

## 2022-08-23 MED ORDER — SODIUM CHLORIDE 0.9% FLUSH
10.0000 mL | Freq: Once | INTRAVENOUS | Status: AC
  Administered 2022-08-23: 10 mL via INTRAVENOUS

## 2022-08-23 MED ORDER — SODIUM CHLORIDE 0.9% FLUSH
10.0000 mL | INTRAVENOUS | Status: DC | PRN
  Administered 2022-08-23: 10 mL via INTRAVENOUS

## 2022-08-23 NOTE — Patient Instructions (Signed)
[facility]  Discharge Instructions: Thank you for choosing [facility] to provide your oncology and hematology care.   If you have a lab appointment with the Cancer Center, please go directly to the Cancer Center and check in at the registration area.  Wear comfortable clothing and clothing appropriate for easy access to any Portacath or PICC line.   We strive to give you quality time with your provider. You may need to reschedule your appointment if you arrive late (15 or more minutes).  Arriving late affects you and other patients whose appointments are after yours.  Also, if you miss three or more appointments without notifying the office, you may be dismissed from the clinic at the provider's discretion.      For prescription refill requests, have your pharmacy contact our office and allow 72 hours for refills to be completed.    Today you received the following chemotherapy and/or immunotherapy agents Darzalex      To help prevent nausea and vomiting after your treatment, we encourage you to take your nausea medication as directed.  BELOW ARE SYMPTOMS THAT SHOULD BE REPORTED IMMEDIATELY: *FEVER GREATER THAN 100.4 F (38 C) OR HIGHER *CHILLS OR SWEATING *NAUSEA AND VOMITING THAT IS NOT CONTROLLED WITH YOUR NAUSEA MEDICATION *UNUSUAL SHORTNESS OF BREATH *UNUSUAL BRUISING OR BLEEDING *URINARY PROBLEMS (pain or burning when urinating, or frequent urination) *BOWEL PROBLEMS (unusual diarrhea, constipation, pain near the anus) TENDERNESS IN MOUTH AND THROAT WITH OR WITHOUT PRESENCE OF ULCERS (sore throat, sores in mouth, or a toothache) UNUSUAL RASH, SWELLING OR PAIN  UNUSUAL VAGINAL DISCHARGE OR ITCHING   Items with * indicate a potential emergency and should be followed up as soon as possible or go to the Emergency Department if any problems should occur.  Please show the CHEMOTHERAPY ALERT CARD or IMMUNOTHERAPY ALERT CARD at check-in to the  Emergency Department and triage nurse. Should you have questions after your visit or need to cancel or reschedule your appointment, please contact [facility] [place]  [phone] and follow the prompts.  Office hours are 8:00 a.m. to 4:30 p.m. Monday - Friday. Please note that voicemails left after 4:00 p.m. may not be returned until the following business day.  We are closed weekends and major holidays. You have access to a nurse at all times for urgent questions. Please call the main number to the clinic [phone] and follow the prompts.  For any non-urgent questions, you may also contact your provider using MyChart. We now offer e-Visits for anyone 18 and older to request care online for non-urgent symptoms. For details visit [email or web address].   Also download the MyChart app! Go to the app store, search "MyChart", open the app, select , and log in with your MyChart username and password.  Masks are optional in the cancer centers. If you would like for your care team to wear a mask while they are taking care of you, please let them know. You may have one support person who is at least 63 years old accompany you for your appointments. 

## 2022-08-23 NOTE — Progress Notes (Signed)
Patient refused to wait one hour post injection. She had it before with no side effects. Released stable and ASX.

## 2022-09-04 ENCOUNTER — Other Ambulatory Visit: Payer: Self-pay | Admitting: Hematology & Oncology

## 2022-09-04 DIAGNOSIS — K5900 Constipation, unspecified: Secondary | ICD-10-CM

## 2022-09-06 ENCOUNTER — Inpatient Hospital Stay: Payer: BC Managed Care – PPO

## 2022-09-06 ENCOUNTER — Encounter: Payer: Self-pay | Admitting: *Deleted

## 2022-09-06 ENCOUNTER — Encounter: Payer: Self-pay | Admitting: Hematology & Oncology

## 2022-09-06 ENCOUNTER — Inpatient Hospital Stay: Payer: BC Managed Care – PPO | Attending: Hematology & Oncology | Admitting: Hematology & Oncology

## 2022-09-06 VITALS — BP 157/88 | HR 90 | Temp 98.3°F | Resp 19 | Wt 176.0 lb

## 2022-09-06 DIAGNOSIS — C9000 Multiple myeloma not having achieved remission: Secondary | ICD-10-CM

## 2022-09-06 DIAGNOSIS — D631 Anemia in chronic kidney disease: Secondary | ICD-10-CM | POA: Diagnosis not present

## 2022-09-06 DIAGNOSIS — Z5111 Encounter for antineoplastic chemotherapy: Secondary | ICD-10-CM | POA: Insufficient documentation

## 2022-09-06 DIAGNOSIS — N1832 Chronic kidney disease, stage 3b: Secondary | ICD-10-CM | POA: Insufficient documentation

## 2022-09-06 DIAGNOSIS — Z5112 Encounter for antineoplastic immunotherapy: Secondary | ICD-10-CM | POA: Insufficient documentation

## 2022-09-06 LAB — LACTATE DEHYDROGENASE: LDH: 283 U/L — ABNORMAL HIGH (ref 98–192)

## 2022-09-06 LAB — CMP (CANCER CENTER ONLY)
ALT: 13 U/L (ref 0–44)
AST: 28 U/L (ref 15–41)
Albumin: 4.2 g/dL (ref 3.5–5.0)
Alkaline Phosphatase: 185 U/L — ABNORMAL HIGH (ref 38–126)
Anion gap: 9 (ref 5–15)
BUN: 8 mg/dL (ref 8–23)
CO2: 25 mmol/L (ref 22–32)
Calcium: 10.2 mg/dL (ref 8.9–10.3)
Chloride: 104 mmol/L (ref 98–111)
Creatinine: 1.34 mg/dL — ABNORMAL HIGH (ref 0.44–1.00)
GFR, Estimated: 45 mL/min — ABNORMAL LOW (ref >60)
Glucose, Bld: 118 mg/dL — ABNORMAL HIGH (ref 70–99)
Potassium: 3.9 mmol/L (ref 3.5–5.1)
Sodium: 138 mmol/L (ref 135–145)
Total Bilirubin: 0.6 mg/dL (ref 0.3–1.2)
Total Protein: 6.7 g/dL (ref 6.5–8.1)

## 2022-09-06 LAB — CBC WITH DIFFERENTIAL (CANCER CENTER ONLY)
Abs Immature Granulocytes: 0.01 10*3/uL (ref 0.00–0.07)
Basophils Absolute: 0 10*3/uL (ref 0.0–0.1)
Basophils Relative: 0 %
Eosinophils Absolute: 0 10*3/uL (ref 0.0–0.5)
Eosinophils Relative: 1 %
HCT: 35.8 % — ABNORMAL LOW (ref 36.0–46.0)
Hemoglobin: 11.3 g/dL — ABNORMAL LOW (ref 12.0–15.0)
Immature Granulocytes: 0 %
Lymphocytes Relative: 32 %
Lymphs Abs: 1.8 10*3/uL (ref 0.7–4.0)
MCH: 28.3 pg (ref 26.0–34.0)
MCHC: 31.6 g/dL (ref 30.0–36.0)
MCV: 89.7 fL (ref 80.0–100.0)
Monocytes Absolute: 1.7 10*3/uL — ABNORMAL HIGH (ref 0.1–1.0)
Monocytes Relative: 30 %
Neutro Abs: 2 10*3/uL (ref 1.7–7.7)
Neutrophils Relative %: 37 %
Platelet Count: 151 10*3/uL (ref 150–400)
RBC: 3.99 MIL/uL (ref 3.87–5.11)
RDW: 18 % — ABNORMAL HIGH (ref 11.5–15.5)
WBC Count: 5.5 10*3/uL (ref 4.0–10.5)
nRBC: 0 % (ref 0.0–0.2)

## 2022-09-06 MED ORDER — SODIUM CHLORIDE 0.9 % IV SOLN
20.0000 mg | Freq: Once | INTRAVENOUS | Status: AC
  Administered 2022-09-06: 20 mg via INTRAVENOUS
  Filled 2022-09-06: qty 20

## 2022-09-06 MED ORDER — SODIUM CHLORIDE 0.9 % IV SOLN: Freq: Once | INTRAVENOUS | Status: AC

## 2022-09-06 MED ORDER — HEPARIN SOD (PORK) LOCK FLUSH 100 UNIT/ML IV SOLN
500.0000 [IU] | Freq: Once | INTRAVENOUS | Status: AC | PRN
  Administered 2022-09-06: 500 [IU]

## 2022-09-06 MED ORDER — BORTEZOMIB CHEMO SQ INJECTION 3.5 MG (2.5MG/ML)
1.3000 mg/m2 | Freq: Once | INTRAMUSCULAR | Status: AC
  Administered 2022-09-06: 2.75 mg via SUBCUTANEOUS
  Filled 2022-09-06: qty 1.1

## 2022-09-06 MED ORDER — DARATUMUMAB-HYALURONIDASE-FIHJ 1800-30000 MG-UT/15ML ~~LOC~~ SOLN
1800.0000 mg | Freq: Once | SUBCUTANEOUS | Status: AC
  Administered 2022-09-06: 1800 mg via SUBCUTANEOUS
  Filled 2022-09-06: qty 15

## 2022-09-06 MED ORDER — ACETAMINOPHEN 325 MG PO TABS
650.0000 mg | ORAL_TABLET | Freq: Once | ORAL | Status: AC
  Administered 2022-09-06: 650 mg via ORAL
  Filled 2022-09-06: qty 2

## 2022-09-06 MED ORDER — DIPHENHYDRAMINE HCL 25 MG PO CAPS
50.0000 mg | ORAL_CAPSULE | Freq: Once | ORAL | Status: AC
  Administered 2022-09-06: 50 mg via ORAL
  Filled 2022-09-06: qty 2

## 2022-09-06 MED ORDER — SODIUM CHLORIDE 0.9% FLUSH
10.0000 mL | INTRAVENOUS | Status: DC | PRN
  Administered 2022-09-06: 10 mL

## 2022-09-06 MED ORDER — SODIUM CHLORIDE 0.9 % IV SOLN
400.0000 mg | Freq: Once | INTRAVENOUS | Status: AC
  Administered 2022-09-06: 400 mg via INTRAVENOUS
  Filled 2022-09-06: qty 20

## 2022-09-06 NOTE — Progress Notes (Signed)
Patient will need an MRI to evaluate persistent pain. MRI has not been authorized. Will follow to make sure that patient gets scheduled.   Oncology Nurse Navigator Documentation     09/06/2022   11:45 AM  Oncology Nurse Navigator Flowsheets  Navigator Follow Up Date: 09/11/2022  Navigator Follow Up Reason: Appointment Review  Navigator Location [facility]  Navigator Encounter Type Treatment;Appt/Treatment Plan Review  Patient Visit Type MedOnc  Treatment Phase Active Tx  Barriers/Navigation Needs Coordination of Care;Education  Interventions Psycho-Social Support  Acuity Level 2-Minimal Needs (1-2 Barriers Identified)  Support Groups/Services Friends and Family  Time Spent with Patient 15

## 2022-09-06 NOTE — Progress Notes (Signed)
Hematology and Oncology Follow Up Visit  Michelle [name] [id number] 1961/07/03 61 y.o. 09/06/2022   Principle Diagnosis:  IgG Kappa myeloma -- normal cytogenetics -- dup 1p/17p-/t(14:20) Anemia of renal failure -- stage 3b  Current Therapy:   Faspro//Cytoxan/Velcade/Decadron -- s/p cycle #2 -- start on 06/20/2022 XRT to lumbar spine -- completed on 06/26/2022 Xgeva 120 mg sq q 3 months - next dose in 10/2022 Aranesp 300 mcg sq q month for Hgb < 11     Interim History:  Michelle [name] is back for follow-up.  As expected, she has responded very nicely.  Her M spike went from 1.8 g/dL down to 0.4 g/dL.  Her IgG level went from 2600 mg/dL down to 760 mg/dL.  The Kappa light chain went from 1150 mg/dL down to 580 mg/dL.    Her last 24-hour urine that was done back in August showed 1850 mg of Kappa light chain.  She is having some pain over on the left scapular area.  There are some pain in the left tricep area.  I do not know if this might be from her cervical spine.  It probably would not be a bad idea to check a MRI of her cervical spine.  Patient also have some visual issues.  I am not sure as to why she would have visual issues.  She will see her ophthalmologist for this.  She has had no fever.  She has had no nausea or vomiting.  There is been no change in bowel or bladder habits.  There is been no bleeding.  She has had no rashes.  Overall, I would say performance status is probably ECOG 1.   Medications:  Current Outpatient Medications:    levothyroxine (SYNTHROID) 88 MCG tablet, 1 TABLET EVERY MORNING ON AN EMPTY STOMACH ORALLY ONCE A DAY 90 DAYS, Disp: , Rfl:    lisinopril-hydrochlorothiazide (ZESTORETIC) 10-12.5 MG tablet, Take 1 tablet by mouth daily., Disp: , Rfl:    metoprolol tartrate (LOPRESSOR) 25 MG tablet, Take 1 tablet (25 mg total) by mouth 2 (two) times daily., Disp: 60 tablet, Rfl: 1   Multiple Vitamin (MULTIVITAMIN WITH MINERALS) TABS tablet, Take 1 tablet by mouth daily.,  Disp: , Rfl:    Oxycodone HCl 10 MG TABS, Take 1 tablet (10 mg total) by mouth every 4 (four) hours as needed., Disp: 120 tablet, Rfl: 0   pantoprazole (PROTONIX) 40 MG tablet, Take 1 tablet (40 mg total) by mouth 2 (two) times daily., Disp: 60 tablet, Rfl: 1   rosuvastatin (CRESTOR) 10 MG tablet, TAKE 1 TABLET BY MOUTH EVERY DAY FOR 90 DAYS, Disp: , Rfl:    senna (SENOKOT) 8.6 MG TABS tablet, Take 1 tablet (8.6 mg total) by mouth daily as needed for mild constipation or moderate constipation., Disp: 30 tablet, Rfl: 0   fluconazole (DIFLUCAN) 100 MG tablet, Take 100 mg by mouth daily. (Patient not taking: Reported on 08/02/2022), Disp: , Rfl:    fluticasone (FLONASE) 50 MCG/ACT nasal spray, Place 1 spray into both nostrils daily. (Patient not taking: Reported on 08/02/2022), Disp: , Rfl:    lactulose (CHRONULAC) 10 GM/15ML solution, TAKE 15 MLS (10 G TOTAL) BY MOUTH 2 (TWO) TIMES DAILY. (Patient not taking: Reported on 09/06/2022), Disp: 708 mL, Rfl: 1   prochlorperazine (COMPAZINE) 10 MG tablet, Take 1 tablet (10 mg total) by mouth every 6 (six) hours as needed for nausea or vomiting. (Patient not taking: Reported on 09/06/2022), Disp: 30 tablet, Rfl: 5  Allergies:  Allergies  Allergen Reactions   Penicillins Other (See Comments)    Severe Yeast infection... No deathly reactions   Codeine Nausea Only   Morphine Other (See Comments)    headache   Tramadol     headache    Past Medical History, Surgical history, Social history, and Family History were reviewed and updated.  Review of Systems: Review of Systems  Constitutional:  Positive for fatigue.  HENT:  Negative.    Eyes: Negative.   Respiratory: Negative.    Cardiovascular:  Positive for leg swelling.  Gastrointestinal: Negative.   Endocrine: Negative.   Genitourinary: Negative.    Musculoskeletal:  Positive for arthralgias, back pain and myalgias.  Skin: Negative.   Neurological: Negative.   Hematological: Negative.    Psychiatric/Behavioral: Negative.      Physical Exam:  weight is 176 lb (79.8 kg). Her oral temperature is 98.3 F (36.8 C). Her blood pressure is 157/88 (abnormal) and her pulse is 90. Her respiration is 19 and oxygen saturation is 99%.   Wt Readings from Last 3 Encounters:  09/06/22 176 lb (79.8 kg)  08/02/22 182 lb 6.4 oz (82.7 kg)  07/04/22 (P) 205 lb 9.6 oz (93.3 kg)    Physical Exam Vitals reviewed.  HENT:     Head: Normocephalic and atraumatic.  Eyes:     Pupils: Pupils are equal, round, and reactive to light.  Cardiovascular:     Rate and Rhythm: Normal rate and regular rhythm.     Heart sounds: Normal heart sounds.  Pulmonary:     Effort: Pulmonary effort is normal.     Breath sounds: Normal breath sounds.  Abdominal:     General: Bowel sounds are normal.     Palpations: Abdomen is soft.  Musculoskeletal:        General: No tenderness or deformity. Normal range of motion.     Cervical back: Normal range of motion.     Comments: Her lower extremities does show some swelling bilateral.  She probably has 2+ edema bilaterally in her legs.  Lymphadenopathy:     Cervical: No cervical adenopathy.  Skin:    General: Skin is warm and dry.     Findings: No erythema or rash.  Neurological:     Mental Status: She is alert and oriented to person, place, and time.  Psychiatric:        Behavior: Behavior normal.        Thought Content: Thought content normal.        Judgment: Judgment normal.      Lab Results  Component Value Date   WBC 5.5 09/06/2022   HGB 11.3 (L) 09/06/2022   HCT 35.8 (L) 09/06/2022   MCV 89.7 09/06/2022   PLT 151 09/06/2022     Chemistry      Component Value Date/Time   NA 138 08/16/2022 0952   K 3.4 (L) 08/16/2022 0952   CL 105 08/16/2022 0952   CO2 25 08/16/2022 0952   BUN 5 (L) 08/16/2022 0952   CREATININE 1.61 (H) 08/16/2022 0952      Component Value Date/Time   CALCIUM 9.4 08/16/2022 0952   ALKPHOS 219 (H) 08/16/2022 0952   AST  20 08/16/2022 0952   ALT 11 08/16/2022 0952   BILITOT 0.6 08/16/2022 0952      Impression and Plan: Michelle [name] is a very nice 61 year old African-American female.  She has IgG kappa myeloma.  The problem clearly is with the light chains.  I think this is what was  causing her renal insufficiency.  Her renal function continues to improve.  I am just so happy that her myeloma levels are coming down nicely.  I still want to get her to transplant.  I still think we are on track for this.  We will go ahead with her third cycle of treatment.  Hopefully, if everything continues to improve, we will be able to do a transplant in the Winter or Spring.  We will probably need to repeat a 24-hour urine on her again.  We may get this next time that she is here.  I will see about doing her chest x-ray today.  We will see about doing the MRI next week.    Volanda Napoleon, MD 11/3/20239:19 AM

## 2022-09-07 LAB — BETA 2 MICROGLOBULIN, SERUM: Beta-2 Microglobulin: 6.8 mg/L — ABNORMAL HIGH (ref 0.6–2.4)

## 2022-09-08 ENCOUNTER — Other Ambulatory Visit: Payer: Self-pay

## 2022-09-08 LAB — IGG, IGA, IGM
IgA: <5 mg/dL — ABNORMAL LOW (ref 87–352)
IgG (Immunoglobin G), Serum: 737 mg/dL (ref 586–1602)
IgM (Immunoglobulin M), Srm: <5 mg/dL — ABNORMAL LOW (ref 26–217)

## 2022-09-09 LAB — KAPPA/LAMBDA LIGHT CHAINS
Kappa free light chain: 975 mg/L — ABNORMAL HIGH (ref 3.3–19.4)
Kappa, lambda light chain ratio: 304.69 — ABNORMAL HIGH (ref 0.26–1.65)
Lambda free light chains: 3.2 mg/L — ABNORMAL LOW (ref 5.7–26.3)

## 2022-09-10 ENCOUNTER — Other Ambulatory Visit: Payer: Self-pay | Admitting: *Deleted

## 2022-09-10 DIAGNOSIS — F411 Generalized anxiety disorder: Secondary | ICD-10-CM

## 2022-09-10 DIAGNOSIS — C9000 Multiple myeloma not having achieved remission: Secondary | ICD-10-CM

## 2022-09-10 DIAGNOSIS — C7951 Secondary malignant neoplasm of bone: Secondary | ICD-10-CM

## 2022-09-10 MED ORDER — DIAZEPAM 5 MG PO TABS: ORAL_TABLET | ORAL | 0 refills | Status: DC

## 2022-09-11 ENCOUNTER — Encounter: Payer: Self-pay | Admitting: *Deleted

## 2022-09-11 ENCOUNTER — Encounter: Payer: Self-pay | Admitting: Hematology & Oncology

## 2022-09-11 NOTE — Progress Notes (Signed)
Patient's MRI scheduled for tomorrow, 09/12/2022. Her follow up appointments for this office have still not been scheduled.   Oncology Nurse Navigator Documentation     09/11/2022    9:45 AM  Oncology Nurse Navigator Flowsheets  Navigator Follow Up Date: 09/13/2022  Navigator Follow Up Reason: Scan Review  Navigator Location [facility]  Navigator Encounter Type Appt/Treatment Plan Review  Patient Visit Type MedOnc  Treatment Phase Active Tx  Barriers/Navigation Needs Coordination of Care;Education  Interventions Coordination of Care  Acuity Level 2-Minimal Needs (1-2 Barriers Identified)  Coordination of Care Appts  Support Groups/Services Friends and Family  Time Spent with Patient 15

## 2022-09-12 ENCOUNTER — Ambulatory Visit (HOSPITAL_COMMUNITY)
Admission: RE | Admit: 2022-09-12 | Discharge: 2022-09-12 | Disposition: A | Discharge status: Home / Self Care | Payer: BC Managed Care – PPO | Source: Ambulatory Visit | Attending: Hematology & Oncology | Admitting: Hematology & Oncology

## 2022-09-12 ENCOUNTER — Other Ambulatory Visit: Payer: Self-pay | Admitting: *Deleted

## 2022-09-12 ENCOUNTER — Other Ambulatory Visit: Payer: Self-pay

## 2022-09-12 DIAGNOSIS — C9000 Multiple myeloma not having achieved remission: Secondary | ICD-10-CM | POA: Insufficient documentation

## 2022-09-12 DIAGNOSIS — C7951 Secondary malignant neoplasm of bone: Secondary | ICD-10-CM

## 2022-09-12 DIAGNOSIS — F411 Generalized anxiety disorder: Secondary | ICD-10-CM

## 2022-09-12 MED ORDER — DIAZEPAM 5 MG PO TABS: ORAL_TABLET | ORAL | 0 refills | Status: DC

## 2022-09-12 MED ORDER — GADOBUTROL 1 MMOL/ML IV SOLN
8.0000 mL | Freq: Once | INTRAVENOUS | Status: AC | PRN
  Administered 2022-09-12: 8 mL via INTRAVENOUS

## 2022-09-13 ENCOUNTER — Encounter: Payer: Self-pay | Admitting: *Deleted

## 2022-09-13 ENCOUNTER — Other Ambulatory Visit: Payer: Self-pay | Admitting: *Deleted

## 2022-09-13 ENCOUNTER — Other Ambulatory Visit: Payer: Self-pay | Admitting: Hematology & Oncology

## 2022-09-13 DIAGNOSIS — C9000 Multiple myeloma not having achieved remission: Secondary | ICD-10-CM

## 2022-09-13 MED ORDER — OXYCODONE HCL 10 MG PO TABS: 10.0000 mg | ORAL_TABLET | ORAL | 0 refills | Status: DC | PRN

## 2022-09-13 NOTE — Progress Notes (Signed)
MRI cervical spine reviewed with Dr Marin Olp.   Oncology Nurse Navigator Documentation     09/13/2022   10:45 AM  Oncology Nurse Navigator Flowsheets  Navigator Follow Up Date: 10/04/2022  Navigator Follow Up Reason: Follow-up Appointment;Chemotherapy  Navigator Location [facility]  Navigator Encounter Type Scan Review  Patient Visit Type MedOnc  Treatment Phase Active Tx  Barriers/Navigation Needs Coordination of Care;Education  Interventions None Required  Acuity Level 2-Minimal Needs (1-2 Barriers Identified)  Support Groups/Services Friends and Family  Time Spent with Patient 15

## 2022-09-16 LAB — PROTEIN ELECTROPHORESIS, SERUM, WITH REFLEX
A/G Ratio: 1.7 (ref 0.7–1.7)
Albumin ELP: 3.8 g/dL (ref 2.9–4.4)
Alpha-1-Globulin: 0.3 g/dL (ref 0.0–0.4)
Alpha-2-Globulin: 0.7 g/dL (ref 0.4–1.0)
Beta Globulin: 0.7 g/dL (ref 0.7–1.3)
Gamma Globulin: 0.6 g/dL (ref 0.4–1.8)
Globulin, Total: 2.2 g/dL (ref 2.2–3.9)
M-Spike, %: 0.3 g/dL — ABNORMAL HIGH
SPEP Interpretation: 0
Total Protein ELP: 6 g/dL (ref 6.0–8.5)

## 2022-09-16 LAB — IMMUNOFIXATION REFLEX, SERUM
IgA: <5 mg/dL — ABNORMAL LOW (ref 87–352)
IgG (Immunoglobin G), Serum: 731 mg/dL (ref 586–1602)
IgM (Immunoglobulin M), Srm: 6 mg/dL — ABNORMAL LOW (ref 26–217)

## 2022-09-17 ENCOUNTER — Ambulatory Visit (HOSPITAL_COMMUNITY)
Admission: RE | Admit: 2022-09-17 | Discharge: 2022-09-17 | Disposition: A | Discharge status: Home / Self Care | Payer: BC Managed Care – PPO | Source: Ambulatory Visit | Attending: Hematology & Oncology | Admitting: Hematology & Oncology

## 2022-09-17 ENCOUNTER — Encounter: Payer: Self-pay | Admitting: Hematology & Oncology

## 2022-09-17 ENCOUNTER — Ambulatory Visit (HOSPITAL_COMMUNITY)
Admission: RE | Admit: 2022-09-17 | Discharge: 2022-09-17 | Disposition: A | Discharge status: Home / Self Care | Payer: BC Managed Care – PPO | Source: Ambulatory Visit | Attending: Physician Assistant | Admitting: Physician Assistant

## 2022-09-17 VITALS — BP 146/89

## 2022-09-17 DIAGNOSIS — C9000 Multiple myeloma not having achieved remission: Secondary | ICD-10-CM | POA: Diagnosis present

## 2022-09-17 DIAGNOSIS — J90 Pleural effusion, not elsewhere classified: Secondary | ICD-10-CM

## 2022-09-17 LAB — LACTATE DEHYDROGENASE, PLEURAL OR PERITONEAL FLUID: LD, Fluid: 208 U/L — ABNORMAL HIGH (ref 3–23)

## 2022-09-17 MED ORDER — LIDOCAINE HCL 1 % IJ SOLN
INTRAMUSCULAR | Status: AC
  Administered 2022-09-17: 10 mL
  Filled 2022-09-17: qty 20

## 2022-09-17 NOTE — Progress Notes (Signed)
Reviewed scan with Dr Marin Olp. He will place orders.   Oncology Nurse Navigator Documentation     09/13/2022   10:45 AM  Oncology Nurse Navigator Flowsheets  Navigator Follow Up Date: 10/04/2022  Navigator Follow Up Reason: Follow-up Appointment;Chemotherapy  Navigator Location [facility]  Navigator Encounter Type Scan Review  Patient Visit Type MedOnc  Treatment Phase Active Tx  Barriers/Navigation Needs Coordination of Care;Education  Interventions None Required  Acuity Level 2-Minimal Needs (1-2 Barriers Identified)  Support Groups/Services Friends and Family  Time Spent with Patient 15

## 2022-09-17 NOTE — Procedures (Signed)
PROCEDURE SUMMARY:  Successful US guided right thoracentesis. Yielded 1L of amber pleural fluid. Pt tolerated procedure well. No immediate complications.  Specimen was sent for labs. CXR ordered.  EBL < 5 mL  Emma Birchler PA-C 09/17/2022 12:32 PM

## 2022-09-19 ENCOUNTER — Emergency Department (HOSPITAL_COMMUNITY): Payer: BC Managed Care – PPO

## 2022-09-19 ENCOUNTER — Other Ambulatory Visit: Payer: Self-pay

## 2022-09-19 ENCOUNTER — Emergency Department (HOSPITAL_COMMUNITY)
Admission: EM | Admit: 2022-09-19 | Discharge: 2022-09-20 | Disposition: A | Discharge status: Home / Self Care | Payer: BC Managed Care – PPO | Attending: Emergency Medicine | Admitting: Emergency Medicine

## 2022-09-19 ENCOUNTER — Encounter (HOSPITAL_COMMUNITY): Payer: Self-pay

## 2022-09-19 DIAGNOSIS — Z79899 Other long term (current) drug therapy: Secondary | ICD-10-CM | POA: Diagnosis not present

## 2022-09-19 DIAGNOSIS — Z8579 Personal history of other malignant neoplasms of lymphoid, hematopoietic and related tissues: Secondary | ICD-10-CM | POA: Diagnosis not present

## 2022-09-19 DIAGNOSIS — R900 Intracranial space-occupying lesion found on diagnostic imaging of central nervous system: Secondary | ICD-10-CM

## 2022-09-19 DIAGNOSIS — H538 Other visual disturbances: Secondary | ICD-10-CM | POA: Diagnosis present

## 2022-09-19 DIAGNOSIS — G9389 Other specified disorders of brain: Secondary | ICD-10-CM | POA: Insufficient documentation

## 2022-09-19 LAB — BASIC METABOLIC PANEL
Anion gap: 9 (ref 5–15)
BUN: 8 mg/dL (ref 8–23)
CO2: 23 mmol/L (ref 22–32)
Calcium: 9.8 mg/dL (ref 8.9–10.3)
Chloride: 106 mmol/L (ref 98–111)
Creatinine, Ser: 1.26 mg/dL — ABNORMAL HIGH (ref 0.44–1.00)
GFR, Estimated: 48 mL/min — ABNORMAL LOW (ref >60)
Glucose, Bld: 112 mg/dL — ABNORMAL HIGH (ref 70–99)
Potassium: 3.6 mmol/L (ref 3.5–5.1)
Sodium: 138 mmol/L (ref 135–145)

## 2022-09-19 LAB — CBC WITH DIFFERENTIAL/PLATELET
Abs Immature Granulocytes: 0.03 10*3/uL (ref 0.00–0.07)
Basophils Absolute: 0 10*3/uL (ref 0.0–0.1)
Basophils Relative: 0 %
Eosinophils Absolute: 0.1 10*3/uL (ref 0.0–0.5)
Eosinophils Relative: 1 %
HCT: 35.5 % — ABNORMAL LOW (ref 36.0–46.0)
Hemoglobin: 11.3 g/dL — ABNORMAL LOW (ref 12.0–15.0)
Immature Granulocytes: 0 %
Lymphocytes Relative: 19 %
Lymphs Abs: 1.4 10*3/uL (ref 0.7–4.0)
MCH: 28.7 pg (ref 26.0–34.0)
MCHC: 31.8 g/dL (ref 30.0–36.0)
MCV: 90.1 fL (ref 80.0–100.0)
Monocytes Absolute: 2.3 10*3/uL — ABNORMAL HIGH (ref 0.1–1.0)
Monocytes Relative: 31 %
Neutro Abs: 3.6 10*3/uL (ref 1.7–7.7)
Neutrophils Relative %: 49 %
Platelets: 197 10*3/uL (ref 150–400)
RBC: 3.94 MIL/uL (ref 3.87–5.11)
RDW: 16.6 % — ABNORMAL HIGH (ref 11.5–15.5)
WBC: 7.5 10*3/uL (ref 4.0–10.5)
nRBC: 0 % (ref 0.0–0.2)

## 2022-09-19 LAB — CYTOLOGY - NON PAP

## 2022-09-19 MED ORDER — HEPARIN SOD (PORK) LOCK FLUSH 100 UNIT/ML IV SOLN
500.0000 [IU] | Freq: Once | INTRAVENOUS | Status: AC
  Administered 2022-09-20: 500 [IU]
  Filled 2022-09-19: qty 5

## 2022-09-19 MED ORDER — LORAZEPAM 2 MG/ML IJ SOLN
1.0000 mg | Freq: Once | INTRAMUSCULAR | Status: AC
  Administered 2022-09-19: 1 mg via INTRAVENOUS
  Filled 2022-09-19: qty 1

## 2022-09-19 MED ORDER — GADOBUTROL 1 MMOL/ML IV SOLN
8.0000 mL | Freq: Once | INTRAVENOUS | Status: AC | PRN
  Administered 2022-09-19: 8 mL via INTRAVENOUS

## 2022-09-19 MED ORDER — LORAZEPAM 0.5 MG PO TABS: 0.5000 mg | ORAL_TABLET | Freq: Once | ORAL | Status: DC

## 2022-09-19 MED ORDER — LORAZEPAM 2 MG/ML IJ SOLN: 1.0000 mg | Freq: Once | INTRAMUSCULAR | Status: DC

## 2022-09-19 NOTE — ED Triage Notes (Signed)
Pt referred by eye doctor for double vision and pressure with palpitation to nose and below left eye.  Last chemo tx 11/4  Denies blood thinner usage

## 2022-09-19 NOTE — ED Provider Notes (Signed)
[facility] DEPT Provider Note   CSN: [id number] Arrival date & time: 09/19/22  1551     History  Chief Complaint  Patient presents with   Eye Problem    Michelle [name] is a 61 y.o. female with hx of multiple myeloma on infusion, presented to Emergency Department complaint of blurred painless vision in her left eye.  This been ongoing for several weeks.  She says she felt she was having pressure in her sinuses.  She is having difficulty looking all the way to the left.  She went to see an ophthalmologist today, Dr Tama High, referred her into the ER for an MRI of the orbit and brain.  I reviewed the paperwork she arrived with, which is her evaluation from the ophthalmologist office, who noted that her vision was grossly intact, and did not appear to be an intraocular emergency, but there was concern for cranial nerve palsy, possible CVA.  HPI     Home Medications Prior to Admission medications   Medication Sig Start Date End Date Taking? Authorizing Provider  diazepam (VALIUM) 5 MG tablet Take thirty minutes before MRI for anxiety. 09/12/22   Volanda Napoleon, MD  fluconazole (DIFLUCAN) 100 MG tablet Take 100 mg by mouth daily. Patient not taking: Reported on 08/02/2022 07/28/22   [provider]  fluticasone (FLONASE) 50 MCG/ACT nasal spray Place 1 spray into both nostrils daily. Patient not taking: Reported on 08/02/2022 07/26/22   [provider]  lactulose (CHRONULAC) 10 GM/15ML solution TAKE 15 MLS (10 G TOTAL) BY MOUTH 2 (TWO) TIMES DAILY. Patient not taking: Reported on 09/06/2022 09/04/22   Volanda Napoleon, MD  levothyroxine (SYNTHROID) 88 MCG tablet 1 TABLET EVERY MORNING ON AN EMPTY STOMACH ORALLY ONCE A DAY 73 DAYS    [provider]  lisinopril-hydrochlorothiazide (ZESTORETIC) 10-12.5 MG tablet Take 1 tablet by mouth daily. 07/29/22   [provider]  metoprolol tartrate (LOPRESSOR) 25 MG tablet Take 1  tablet (25 mg total) by mouth 2 (two) times daily. 06/26/22   Hongalgi, Lenis Dickinson, MD  Multiple Vitamin (MULTIVITAMIN WITH MINERALS) TABS tablet Take 1 tablet by mouth daily. 06/27/22   Hongalgi, Lenis Dickinson, MD  Oxycodone HCl 10 MG TABS Take 1 tablet (10 mg total) by mouth every 4 (four) hours as needed. 09/13/22   Volanda Napoleon, MD  pantoprazole (PROTONIX) 40 MG tablet Take 1 tablet (40 mg total) by mouth 2 (two) times daily. 06/26/22   Hongalgi, Lenis Dickinson, MD  prochlorperazine (COMPAZINE) 10 MG tablet Take 1 tablet (10 mg total) by mouth every 6 (six) hours as needed for nausea or vomiting. Patient not taking: Reported on 09/06/2022 06/14/22   Tyler Pita, MD  rosuvastatin (CRESTOR) 10 MG tablet TAKE 1 TABLET BY MOUTH EVERY DAY FOR 90 DAYS    [provider]  senna (SENOKOT) 8.6 MG TABS tablet Take 1 tablet (8.6 mg total) by mouth daily as needed for mild constipation or moderate constipation. 06/26/22   Modena Jansky, MD      Allergies    Penicillins, Codeine, Morphine, and Tramadol    Review of Systems   Review of Systems  Physical Exam Updated Vital Signs BP 123/78   Pulse 97   Temp 98.7 F (37.1 C)   Resp 19   SpO2 97%  Physical Exam Constitutional:      General: She is not in acute distress. HENT:     Head: Normocephalic and atraumatic.  Eyes:  Conjunctiva/sclera: Conjunctivae normal.     Pupils: Pupils are equal, round, and reactive to light.  Cardiovascular:     Rate and Rhythm: Normal rate and regular rhythm.  Pulmonary:     Effort: Pulmonary effort is normal. No respiratory distress.  Abdominal:     General: There is no distension.     Tenderness: There is no abdominal tenderness.  Skin:    General: Skin is warm and dry.  Neurological:     General: No focal deficit present.     Mental Status: She is alert. Mental status is at baseline.     Comments: Isolated CN VI palsy of left eye Vision grossly intact PERRL No other cranial nerve deficits noted  on exam  Psychiatric:        Mood and Affect: Mood normal.        Behavior: Behavior normal.     ED Results / Procedures / Treatments   Labs (all labs ordered are listed, but only abnormal results are displayed) Labs Reviewed  BASIC METABOLIC PANEL - Abnormal; Notable for the following components:      Result Value   Glucose, Bld 112 (*)    Creatinine, Ser 1.26 (*)    GFR, Estimated 48 (*)    All other components within normal limits  CBC WITH DIFFERENTIAL/PLATELET - Abnormal; Notable for the following components:   Hemoglobin 11.3 (*)    HCT 35.5 (*)    RDW 16.6 (*)    Monocytes Absolute 2.3 (*)    All other components within normal limits    EKG None  Radiology MR Brain W and Wo Contrast  Result Date: 09/19/2022 CLINICAL DATA:  Visual deficit and possible CN 6 palsy. Multiple myeloma EXAM: MRI HEAD AND ORBITS WITHOUT AND WITH CONTRAST TECHNIQUE: Multiplanar, multiecho pulse sequences of the brain and surrounding structures were obtained without and with intravenous contrast. Multiplanar, multiecho pulse sequences of the orbits and surrounding structures were obtained including fat saturation techniques, before and after intravenous contrast administration. CONTRAST:  43m GADAVIST GADOBUTROL 1 MMOL/ML IV SOLN COMPARISON:  None Available. FINDINGS: MRI HEAD FINDINGS Brain: There is no acute infarct or acute hemorrhage. There is a homogeneously contrast-enhancing, left asymmetric prepontine mass that measures 4.3 x 2.4 x 4.6 cm. This lifts the pituitary gland. The mass involves the clivus. This location is likely to affect CN 6 transit through Dorello's canal. The mass may also extend into the hypoglossal canal and jugular foramen on the left. Vascular: Major flow voids are preserved. Skull and upper cervical spine: Aside from the above described lesion of the clivus, there are contrast-enhancing lesions of the right frontal, right parietal, left parietal and left temporal bones. There  is dural thickening underlying the larger of these lesions at the right frontal, left temporal and inferior right parietal bones. MRI ORBITS FINDINGS Orbits: No traumatic or inflammatory finding. Globes, optic nerves, orbital fat, extraocular muscles, vascular structures, and lacrimal glands are normal. Visualized sinuses: Clear. Soft tissues: Negative. IMPRESSION: 1. Homogeneously contrast-enhancing, left asymmetric prepontine mass measuring 4.3 x 2.4 x 4.6 cm. The mass involves the clivus and is likely to affect CN 6 transit through Dorello's canal. The mass may also extend into the hypoglossal canal and jugular foramen on the left. In the context of multiple myeloma, this is most consistent with plasmacytoma. 2. Multiple other calvarial lesions, consistent with multiple myeloma. 3. Normal MRI of the orbits. Electronically Signed   By: KUlyses JarredM.D.   On: 09/19/2022 22:58  MR ORBITS W WO CONTRAST  Result Date: 09/19/2022 CLINICAL DATA:  Visual deficit and possible CN 6 palsy. Multiple myeloma EXAM: MRI HEAD AND ORBITS WITHOUT AND WITH CONTRAST TECHNIQUE: Multiplanar, multiecho pulse sequences of the brain and surrounding structures were obtained without and with intravenous contrast. Multiplanar, multiecho pulse sequences of the orbits and surrounding structures were obtained including fat saturation techniques, before and after intravenous contrast administration. CONTRAST:  66m GADAVIST GADOBUTROL 1 MMOL/ML IV SOLN COMPARISON:  None Available. FINDINGS: MRI HEAD FINDINGS Brain: There is no acute infarct or acute hemorrhage. There is a homogeneously contrast-enhancing, left asymmetric prepontine mass that measures 4.3 x 2.4 x 4.6 cm. This lifts the pituitary gland. The mass involves the clivus. This location is likely to affect CN 6 transit through Dorello's canal. The mass may also extend into the hypoglossal canal and jugular foramen on the left. Vascular: Major flow voids are preserved. Skull and  upper cervical spine: Aside from the above described lesion of the clivus, there are contrast-enhancing lesions of the right frontal, right parietal, left parietal and left temporal bones. There is dural thickening underlying the larger of these lesions at the right frontal, left temporal and inferior right parietal bones. MRI ORBITS FINDINGS Orbits: No traumatic or inflammatory finding. Globes, optic nerves, orbital fat, extraocular muscles, vascular structures, and lacrimal glands are normal. Visualized sinuses: Clear. Soft tissues: Negative. IMPRESSION: 1. Homogeneously contrast-enhancing, left asymmetric prepontine mass measuring 4.3 x 2.4 x 4.6 cm. The mass involves the clivus and is likely to affect CN 6 transit through Dorello's canal. The mass may also extend into the hypoglossal canal and jugular foramen on the left. In the context of multiple myeloma, this is most consistent with plasmacytoma. 2. Multiple other calvarial lesions, consistent with multiple myeloma. 3. Normal MRI of the orbits. Electronically Signed   By: KUlyses JarredM.D.   On: 09/19/2022 22:58    Procedures Procedures    Medications Ordered in ED Medications  LORazepam (ATIVAN) injection 1 mg (1 mg Intravenous Given 09/19/22 2151)  gadobutrol (GADAVIST) 1 MMOL/ML injection 8 mL (8 mLs Intravenous Contrast Given 09/19/22 2246)    ED Course/ Medical Decision Making/ A&P Clinical Course as of 09/19/22 2336  Thu Sep 19, 2022  2100 Patient is still in line for MRI [MT]  2312 MRI shows potential plasmacytoma along the tract of cranial nerve VI which is likely the cause of compression of the patient's blurred vision.  I have paged to the neurosurgeon to discuss the case [MT]  2329 I spoke to DR Dawley from neurosurgery who agreed the patient can follow-up in the office, no emergent indication for hospitalization or treatment at this time, she will likely need referral to neuro-ophthalmology which her office will assist with.   Patient was updated regarding her diagnosis.  She will follow-up with a neurosurgeon.  Okay for discharge [MT]    Clinical Course User Index [MT] Keshana Klemz, MCarola Rhine MD                           Medical Decision Making Risk Prescription drug management.   This patient presents to the ED with concern for blurred vision left eye. This involves an extensive number of treatment options, and is a complaint that carries with it a high risk of complications and morbidity.  The differential diagnosis includes cranial nerve palsy versus medication side effect versus other.  This may be idiopathic palsy related to another chronic condition.  She is here to rule out stroke.  She does not have painful vision to suggest an intraocular process such as acute angle-closure glaucoma (and had normal ocular pressures measured at the ophthalmologist office), or optic neuritis.    External records from outside source obtained and reviewed including notes from ophthalmology office present at bedside  I ordered and personally interpreted labs.  The pertinent results include: No emergent findings  I ordered imaging studies including  I independently visualized and interpreted imaging which showed intracranial mass that could be consistent with cranial nerve VI lesion, possible sequelae of multiple myeloma. I agree with the radiologist interpretation  I discussed the case with neurosurgery by phone, please see ED course.  No further emergent work-up is required at this time.    Dispostion:  After consideration of the diagnostic results and the patients response to treatment, I feel that the patent would benefit from outpatient specialist f/u.         Final Clinical Impression(s) / ED Diagnoses Final diagnoses:  Blurred vision  Intracranial mass    Rx / DC Orders ED Discharge Orders     None         Wyvonnia Dusky, MD 09/19/22 2337

## 2022-09-19 NOTE — Discharge Instructions (Signed)
Your MRI showed that you have a small mass inside the brain that is likely pushing on one of the nerves to your eye.  This is causing you to have difficulty with vision and double vision in your left eye.  You will need to follow-up with a neurosurgeon or brain specialist in the office.  Please call the number above for Dr. Reatha Armour on Monday.  Let them know that Dr. Reatha Armour is aware of your case and wanted you seen in the office this week if possible.  They may refer you to further specialist from the office.

## 2022-09-20 NOTE — ED Notes (Signed)
Port heparin locked and de accessed. Pt wheeled from ed and transferred to car. Pt verbalized understanding to discharge instructions.

## 2022-09-27 ENCOUNTER — Other Ambulatory Visit: Payer: Self-pay

## 2022-10-01 ENCOUNTER — Other Ambulatory Visit: Payer: Self-pay

## 2022-10-01 ENCOUNTER — Inpatient Hospital Stay (HOSPITAL_COMMUNITY)
Admission: EM | Admit: 2022-10-01 | Discharge: 2022-10-03 | DRG: 841 | Disposition: A | Discharge status: Home / Self Care | Payer: BC Managed Care – PPO | Attending: Internal Medicine | Admitting: Internal Medicine

## 2022-10-01 ENCOUNTER — Observation Stay (HOSPITAL_COMMUNITY): Payer: BC Managed Care – PPO

## 2022-10-01 ENCOUNTER — Other Ambulatory Visit: Payer: Self-pay | Admitting: Radiation Therapy

## 2022-10-01 ENCOUNTER — Emergency Department (HOSPITAL_COMMUNITY): Payer: BC Managed Care – PPO

## 2022-10-01 DIAGNOSIS — I10 Essential (primary) hypertension: Secondary | ICD-10-CM

## 2022-10-01 DIAGNOSIS — Z841 Family history of disorders of kidney and ureter: Secondary | ICD-10-CM

## 2022-10-01 DIAGNOSIS — I129 Hypertensive chronic kidney disease with stage 1 through stage 4 chronic kidney disease, or unspecified chronic kidney disease: Secondary | ICD-10-CM | POA: Diagnosis present

## 2022-10-01 DIAGNOSIS — J910 Malignant pleural effusion: Secondary | ICD-10-CM | POA: Diagnosis not present

## 2022-10-01 DIAGNOSIS — K219 Gastro-esophageal reflux disease without esophagitis: Secondary | ICD-10-CM

## 2022-10-01 DIAGNOSIS — Z885 Allergy status to narcotic agent status: Secondary | ICD-10-CM

## 2022-10-01 DIAGNOSIS — E890 Postprocedural hypothyroidism: Secondary | ICD-10-CM | POA: Diagnosis present

## 2022-10-01 DIAGNOSIS — E039 Hypothyroidism, unspecified: Secondary | ICD-10-CM

## 2022-10-01 DIAGNOSIS — N183 Chronic kidney disease, stage 3 unspecified: Secondary | ICD-10-CM

## 2022-10-01 DIAGNOSIS — F419 Anxiety disorder, unspecified: Secondary | ICD-10-CM

## 2022-10-01 DIAGNOSIS — Z801 Family history of malignant neoplasm of trachea, bronchus and lung: Secondary | ICD-10-CM

## 2022-10-01 DIAGNOSIS — Z79890 Hormone replacement therapy (postmenopausal): Secondary | ICD-10-CM

## 2022-10-01 DIAGNOSIS — E785 Hyperlipidemia, unspecified: Secondary | ICD-10-CM | POA: Diagnosis not present

## 2022-10-01 DIAGNOSIS — Z8249 Family history of ischemic heart disease and other diseases of the circulatory system: Secondary | ICD-10-CM

## 2022-10-01 DIAGNOSIS — Z833 Family history of diabetes mellitus: Secondary | ICD-10-CM

## 2022-10-01 DIAGNOSIS — J90 Pleural effusion, not elsewhere classified: Secondary | ICD-10-CM

## 2022-10-01 DIAGNOSIS — Z823 Family history of stroke: Secondary | ICD-10-CM

## 2022-10-01 DIAGNOSIS — R0602 Shortness of breath: Secondary | ICD-10-CM

## 2022-10-01 DIAGNOSIS — Z880 Allergy status to penicillin: Secondary | ICD-10-CM

## 2022-10-01 DIAGNOSIS — N1831 Chronic kidney disease, stage 3a: Secondary | ICD-10-CM | POA: Insufficient documentation

## 2022-10-01 DIAGNOSIS — C7951 Secondary malignant neoplasm of bone: Secondary | ICD-10-CM | POA: Diagnosis present

## 2022-10-01 DIAGNOSIS — C9000 Multiple myeloma not having achieved remission: Secondary | ICD-10-CM | POA: Diagnosis not present

## 2022-10-01 DIAGNOSIS — Z79899 Other long term (current) drug therapy: Secondary | ICD-10-CM

## 2022-10-01 DIAGNOSIS — N1832 Chronic kidney disease, stage 3b: Secondary | ICD-10-CM | POA: Diagnosis present

## 2022-10-01 DIAGNOSIS — M79621 Pain in right upper arm: Secondary | ICD-10-CM | POA: Diagnosis present

## 2022-10-01 DIAGNOSIS — D631 Anemia in chronic kidney disease: Secondary | ICD-10-CM | POA: Diagnosis present

## 2022-10-01 DIAGNOSIS — R079 Chest pain, unspecified: Secondary | ICD-10-CM

## 2022-10-01 DIAGNOSIS — Z87891 Personal history of nicotine dependence: Secondary | ICD-10-CM

## 2022-10-01 LAB — URINALYSIS, ROUTINE W REFLEX MICROSCOPIC
Bilirubin Urine: NEGATIVE
Glucose, UA: NEGATIVE mg/dL
Hgb urine dipstick: NEGATIVE
Ketones, ur: 20 mg/dL — AB
Leukocytes,Ua: NEGATIVE
Nitrite: NEGATIVE
Protein, ur: 100 mg/dL — AB
Specific Gravity, Urine: 1.018 (ref 1.005–1.030)
pH: 5 (ref 5.0–8.0)

## 2022-10-01 LAB — BASIC METABOLIC PANEL
Anion gap: 12 (ref 5–15)
BUN: 8 mg/dL (ref 8–23)
CO2: 23 mmol/L (ref 22–32)
Calcium: 9.7 mg/dL (ref 8.9–10.3)
Chloride: 103 mmol/L (ref 98–111)
Creatinine, Ser: 1.25 mg/dL — ABNORMAL HIGH (ref 0.44–1.00)
GFR, Estimated: 48 mL/min — ABNORMAL LOW (ref >60)
Glucose, Bld: 112 mg/dL — ABNORMAL HIGH (ref 70–99)
Potassium: 3.8 mmol/L (ref 3.5–5.1)
Sodium: 138 mmol/L (ref 135–145)

## 2022-10-01 LAB — MRSA NEXT GEN BY PCR, NASAL: MRSA by PCR Next Gen: NOT DETECTED

## 2022-10-01 LAB — CBC
HCT: 35.2 % — ABNORMAL LOW (ref 36.0–46.0)
Hemoglobin: 11 g/dL — ABNORMAL LOW (ref 12.0–15.0)
MCH: 28 pg (ref 26.0–34.0)
MCHC: 31.3 g/dL (ref 30.0–36.0)
MCV: 89.6 fL (ref 80.0–100.0)
Platelets: 187 10*3/uL (ref 150–400)
RBC: 3.93 MIL/uL (ref 3.87–5.11)
RDW: 15.3 % (ref 11.5–15.5)
WBC: 6.7 10*3/uL (ref 4.0–10.5)
nRBC: 0 % (ref 0.0–0.2)

## 2022-10-01 LAB — TROPONIN I (HIGH SENSITIVITY)
Troponin I (High Sensitivity): 4 ng/L (ref <18)
Troponin I (High Sensitivity): 5 ng/L (ref <18)

## 2022-10-01 MED ORDER — SODIUM CHLORIDE 0.9% FLUSH: 10.0000 mL | INTRAVENOUS | Status: DC | PRN

## 2022-10-01 MED ORDER — LIDOCAINE HCL 1 % IJ SOLN: 25.0000 mL | Freq: Once | INTRAMUSCULAR | Status: DC

## 2022-10-01 MED ORDER — SODIUM CHLORIDE 0.9% FLUSH
10.0000 mL | Freq: Two times a day (BID) | INTRAVENOUS | Status: DC
  Administered 2022-10-01 – 2022-10-03 (×5): 10 mL

## 2022-10-01 MED ORDER — DEXMEDETOMIDINE HCL IN NACL 200 MCG/50ML IV SOLN
0.4000 ug/kg/h | INTRAVENOUS | Status: DC
  Administered 2022-10-01: 0.4 ug/kg/h via INTRAVENOUS
  Filled 2022-10-01: qty 50

## 2022-10-01 MED ORDER — HYDROMORPHONE HCL 1 MG/ML IJ SOLN
0.5000 mg | INTRAMUSCULAR | Status: DC | PRN
  Administered 2022-10-01: 0.5 mg via INTRAVENOUS
  Filled 2022-10-01: qty 1

## 2022-10-01 MED ORDER — HYDROMORPHONE HCL 1 MG/ML IJ SOLN
1.0000 mg | INTRAMUSCULAR | Status: DC | PRN
  Administered 2022-10-01 – 2022-10-02 (×5): 1 mg via INTRAVENOUS
  Filled 2022-10-01 (×5): qty 1

## 2022-10-01 MED ORDER — DOCUSATE SODIUM 100 MG PO CAPS
100.0000 mg | ORAL_CAPSULE | Freq: Two times a day (BID) | ORAL | Status: DC
  Administered 2022-10-01 – 2022-10-03 (×4): 100 mg via ORAL
  Filled 2022-10-01 (×5): qty 1

## 2022-10-01 MED ORDER — CHLORHEXIDINE GLUCONATE CLOTH 2 % EX PADS
6.0000 | MEDICATED_PAD | Freq: Every day | CUTANEOUS | Status: DC
  Administered 2022-10-01 – 2022-10-02 (×2): 6 via TOPICAL

## 2022-10-01 MED ORDER — ACETAMINOPHEN 650 MG RE SUPP: 650.0000 mg | Freq: Four times a day (QID) | RECTAL | Status: DC | PRN

## 2022-10-01 MED ORDER — ACETAMINOPHEN 325 MG PO TABS: 650.0000 mg | ORAL_TABLET | Freq: Four times a day (QID) | ORAL | Status: DC | PRN

## 2022-10-01 MED ORDER — SODIUM CHLORIDE 0.9% FLUSH
10.0000 mL | Freq: Three times a day (TID) | INTRAVENOUS | Status: DC
  Administered 2022-10-01 – 2022-10-03 (×3): 10 mL via INTRAPLEURAL

## 2022-10-01 MED ORDER — HYDROMORPHONE HCL 1 MG/ML IJ SOLN
0.5000 mg | Freq: Once | INTRAMUSCULAR | Status: AC
  Administered 2022-10-01: 0.5 mg via INTRAVENOUS
  Filled 2022-10-01 (×2): qty 1

## 2022-10-01 MED ORDER — MIDAZOLAM HCL 2 MG/2ML IJ SOLN
1.0000 mg | INTRAMUSCULAR | Status: DC | PRN
  Administered 2022-10-01: 1 mg via INTRAVENOUS
  Filled 2022-10-01: qty 2

## 2022-10-01 MED ORDER — OXYCODONE HCL 5 MG PO TABS
10.0000 mg | ORAL_TABLET | ORAL | Status: DC | PRN
  Administered 2022-10-01 – 2022-10-02 (×5): 10 mg via ORAL
  Filled 2022-10-01 (×5): qty 2

## 2022-10-01 MED ORDER — ORAL CARE MOUTH RINSE: 15.0000 mL | OROMUCOSAL | Status: DC | PRN

## 2022-10-01 MED ORDER — PROCHLORPERAZINE EDISYLATE 10 MG/2ML IJ SOLN
10.0000 mg | Freq: Four times a day (QID) | INTRAMUSCULAR | Status: DC | PRN
  Administered 2022-10-01: 10 mg via INTRAVENOUS
  Filled 2022-10-01: qty 2

## 2022-10-01 MED ORDER — PHENAZOPYRIDINE HCL 100 MG PO TABS: 100.0000 mg | ORAL_TABLET | Freq: Three times a day (TID) | ORAL | Status: DC

## 2022-10-01 MED ORDER — TALC (STERITALC) POWDER FOR INTRAPLEURAL USE
4.0000 g | Freq: Once | INTRAVENOUS | Status: DC
  Filled 2022-10-01: qty 4

## 2022-10-01 MED ORDER — PHENAZOPYRIDINE HCL 100 MG PO TABS: 100.0000 mg | ORAL_TABLET | Freq: Every day | ORAL | Status: DC | PRN

## 2022-10-01 NOTE — ED Provider Notes (Signed)
[facility] Emergency Department Provider Note MRN:  [id number]  Arrival date & time: 10/01/22     Chief Complaint   Shortness of Breath   History of Present Illness   Michelle [name] is a 61 y.o. year-old female with a history of hypertension, multiple myeloma presenting to the ED with chief complaint of shortness of breath.  Worsening shortness of breath over the past week, much worse today.  Right-sided chest pain as well.  No fever, no cough.  Needed her lung drained a couple weeks ago.  Review of Systems  A thorough review of systems was obtained and all systems are negative except as noted in the HPI and PMH.   Patient's Health History    Past Medical History:  Diagnosis Date   Anemia of chronic renal failure, stage 3b ([place]) 08/02/2022   Cancer ([place])    Essential hypertension 02/16/2017   Hyperlipemia    Hyperlipidemia 02/16/2017   Hypertension    Hypothyroidism 02/16/2017   Morbid obesity ([place]) 02/16/2017   Multiple myeloma without remission ([place]) 06/20/2022    Past Surgical History:  Procedure Laterality Date   IR IMAGING GUIDED PORT INSERTION  06/17/2022   IR THORACENTESIS ASP PLEURAL SPACE W/IMG GUIDE  06/17/2022   THYROIDECTOMY      Family History  Problem Relation Age of Onset   Diabetes Paternal Grandfather    Stroke Paternal Grandfather    Heart attack Paternal Grandfather    Diabetes Father    Hypertension Father    Stroke Father    Lung cancer Father    Hypertension Mother    Kidney disease Mother    Hypertension Sister    Stroke Paternal Grandmother    Heart attack Maternal Grandmother     Social History   Socioeconomic History   Marital status: Widowed    Spouse name: Not on file   Number of children: Not on file   Years of education: Not on file   Highest education level: Not on file  Occupational History   Not on file  Tobacco Use   Smoking status: Former    Packs/day: 0.25    Years: 30.00    Total pack years:  7.50    Types: Cigarettes    Quit date: 69    Years since quitting: 33.9   Smokeless tobacco: Never  Vaping Use   Vaping Use: Never used  Substance and Sexual Activity   Alcohol use: No   Drug use: No   Sexual activity: Not Currently    Birth control/protection: Post-menopausal  Other Topics Concern   Not on file  Social History Narrative   Not on file   Social Determinants of Health   Financial Resource Strain: Not on file  Food Insecurity: Not on file  Transportation Needs: Not on file  Physical Activity: Not on file  Stress: Not on file  Social Connections: Not on file  Intimate Partner Violence: Not on file     Physical Exam   Vitals:   10/01/22 0315  BP: (!) 143/83  Pulse: 100  Resp: (!) 22  Temp: 98.1 F (36.7 C)  SpO2: 100%    CONSTITUTIONAL: Well-appearing, NAD NEURO/PSYCH:  Alert and oriented x 3, no focal deficits EYES:  eyes equal and reactive ENT/NECK:  no LAD, no JVD CARDIO: Regular rate, well-perfused, normal S1 and S2 PULM:  CTAB no wheezing or rhonchi GI/GU:  non-distended, non-tender MSK/SPINE:  No gross deformities, no edema SKIN:  no rash, atraumatic   *  Additional and/or pertinent findings included in MDM below  Diagnostic and Interventional Summary    EKG Interpretation  Date/Time:  Tuesday October 01 2022 03:18:29 EST Ventricular Rate:  103 PR Interval:  140 QRS Duration: 81 QT Interval:  334 QTC Calculation: 438 R Axis:   59 Text Interpretation: Sinus tachycardia Low voltage, precordial leads Borderline T abnormalities, anterior leads Confirmed by Gerlene Fee [phone]) on 10/01/2022 3:53:03 AM       Labs Reviewed  BASIC METABOLIC PANEL - Abnormal; Notable for the following components:      Result Value   Glucose, Bld 112 (*)    Creatinine, Ser 1.25 (*)    GFR, Estimated 48 (*)    All other components within normal limits  CBC - Abnormal; Notable for the following components:   Hemoglobin 11.0 (*)    HCT 35.2 (*)     All other components within normal limits  TROPONIN I (HIGH SENSITIVITY)  TROPONIN I (HIGH SENSITIVITY)    DG Chest 2 View  Final Result    IR Radiologist Eval & Mgmt    (Results Pending)    Medications  HYDROmorphone (DILAUDID) injection 0.5 mg (0.5 mg Intravenous Given 10/01/22 0535)     Procedures  /  Critical Care Procedures  ED Course and Medical Decision Making  Initial Impression and Ddx Concern for recurrence of malignant pleural effusion versus pneumothorax versus ACS versus PE.  Past medical/surgical history that increases complexity of ED encounter: Multiple myeloma  Interpretation of Diagnostics I personally reviewed the Chest Xray and my interpretation is as follows: Large recurrence of right-sided pleural effusion  Labs reassuring with no significant blood count or electrolyte disturbance.  Patient Reassessment and Ultimate Disposition/Management     Will consult IR for thoracentesis and admit to medicine given the large and rapid return of the effusion with shortness of breath.  Patient management required discussion with the following services or consulting groups:  Hospitalist Service and Radiology/Interventional Radiology  Complexity of Problems Addressed Acute illness or injury that poses threat of life of bodily function  Additional Data Reviewed and Analyzed Further history obtained from: Recent discharge summary  Additional Factors Impacting ED Encounter Risk Use of parenteral controlled substances and Consideration of hospitalization  Barth Kirks. [place] Small, [place] [email or web address]  Final Clinical Impressions(s) / ED Diagnoses     ICD-10-CM   1. Malignant pleural effusion  J91.0     2. SOB (shortness of breath)  R06.02     3. Chest pain, unspecified type  R07.9       ED Discharge Orders     None        Discharge Instructions Discussed with and Provided to Patient:   Discharge  Instructions   None      [name], MD 10/01/22 [phone]

## 2022-10-01 NOTE — Progress Notes (Signed)
Radiation Oncology         [phone] ________________________________  Initial {Inpatient / Outpatient:20114} Consultation  Name: Michelle [name] MRN: [id number]  Date of Service: 10/02/2022 DOB: 03/16/1961  CC:Varadarajan, Rupashree, MD  Dawley, Troy C, DO   REFERRING PHYSICIAN: Dawley, Troy C, DO  DIAGNOSIS: There were no encounter diagnoses.  No diagnosis found.  HISTORY OF PRESENT ILLNESS: Michelle [name] is a 61 y.o. female seen at the request of Dr. Dawley for multiple myeloma.   Patient has a history of multiple myeloma with bony metastasis. She presented to Dr. Ennever in June of 2023 with extensive disease in her spine. She was initially diagnosed with multiple myeloma and started on chemotherapy in August of 2023. The painful sites of disease in her spine were also treated with radiation therapy in August of 2023.   She was admitted on 06/14/22 for back pain caused by worsening tumor spread and right pleural effusion. She has received thoracentesis on 06/17/22 and 09/17/22 with malignant cells on cytology. More recently, she unfortunately presented to [facility] on 10/01/22 with a one week history of shortness of breath and right sided chest pain that was determined to be caused by malignant pleural effusion. She was admitted to the hospital for observation and pulmonology was consulted.   PREVIOUS RADIATION THERAPY: Yes   Indication for treatment:  Palliation of pain      Radiation treatment dates:   06/13/22 - 06/26/22 Site/dose:   The painful sites of disease in the thoracolumbar spine (T11-S1) were treated to 20 Gy in 10 fractions of 2 Gy each. Beams/energy:   A 3D field set-up was employed with 6 MV X-rays Narrative: The patient tolerated radiation treatment relatively well.  PAST MEDICAL HISTORY:  Past Medical History:  Diagnosis Date   Anemia of chronic renal failure, stage 3b (HCC) 08/02/2022   Cancer (HCC)    Essential hypertension 02/16/2017   Hyperlipemia     Hyperlipidemia 02/16/2017   Hypertension    Hypothyroidism 02/16/2017   Morbid obesity (HCC) 02/16/2017   Multiple myeloma without remission (HCC) 06/20/2022      PAST SURGICAL HISTORY: Past Surgical History:  Procedure Laterality Date   IR IMAGING GUIDED PORT INSERTION  06/17/2022   IR THORACENTESIS ASP PLEURAL SPACE W/IMG GUIDE  06/17/2022   THYROIDECTOMY      FAMILY HISTORY:  Family History  Problem Relation Age of Onset   Diabetes Paternal Grandfather    Stroke Paternal Grandfather    Heart attack Paternal Grandfather    Diabetes Father    Hypertension Father    Stroke Father    Lung cancer Father    Hypertension Mother    Kidney disease Mother    Hypertension Sister    Stroke Paternal Grandmother    Heart attack Maternal Grandmother     SOCIAL HISTORY:  Social History   Socioeconomic History   Marital status: Widowed    Spouse name: Not on file   Number of children: Not on file   Years of education: Not on file   Highest education level: Not on file  Occupational History   Not on file  Tobacco Use   Smoking status: Former    Packs/day: 0.25    Years: 30.00    Total pack years: 7.50    Types: Cigarettes    Quit date: 1990    Years since quitting: 33.9   Smokeless tobacco: Never  Vaping Use   Vaping Use: Never used  Substance and Sexual   Activity   Alcohol use: No   Drug use: No   Sexual activity: Not Currently    Birth control/protection: Post-menopausal  Other Topics Concern   Not on file  Social History Narrative   Not on file   Social Determinants of Health   Financial Resource Strain: Not on file  Food Insecurity: Not on file  Transportation Needs: Not on file  Physical Activity: Not on file  Stress: Not on file  Social Connections: Not on file  Intimate Partner Violence: Not on file    ALLERGIES: Penicillins, Codeine, Morphine, and Tramadol  MEDICATIONS:  No current facility-administered medications for this visit.   No current  outpatient medications on file.   Facility-Administered Medications Ordered in Other Visits  Medication Dose Route Frequency Provider Last Rate Last Admin   acetaminophen (TYLENOL) tablet 650 mg  650 mg Oral Q6H PRN Kyle, Tyrone A, DO       Or   acetaminophen (TYLENOL) suppository 650 mg  650 mg Rectal Q6H PRN Kyle, Tyrone A, DO       Chlorhexidine Gluconate Cloth 2 % PADS 6 each  6 each Topical Daily Kyle, Tyrone A, DO   6 each at 10/01/22 1232   dexmedetomidine (PRECEDEX) 200 MCG/50ML (4 mcg/mL) infusion  0.4-1.2 mcg/kg/hr Intravenous Titrated Reese, Stephanie M, PA-C       docusate sodium (COLACE) capsule 100 mg  100 mg Oral BID Kyle, Tyrone A, DO       HYDROmorphone (DILAUDID) injection 0.5 mg  0.5 mg Intravenous Q10 min PRN Reese, Stephanie M, PA-C       HYDROmorphone (DILAUDID) injection 1 mg  1 mg Intravenous Q4H PRN Kyle, Tyrone A, DO   1 mg at 10/01/22 0935   lidocaine (XYLOCAINE) 1 % (with pres) injection 25 mL  25 mL Intrapleural Once Reese, Stephanie M, PA-C       midazolam (VERSED) injection 1 mg  1 mg Intravenous Q10 min PRN Reese, Stephanie M, PA-C       Oral care mouth rinse  15 mL Mouth Rinse PRN Smith, Daniel C, MD       oxyCODONE (Oxy IR/ROXICODONE) immediate release tablet 10 mg  10 mg Oral Q4H PRN Kyle, Tyrone A, DO   10 mg at 10/01/22 1338   phenazopyridine (PYRIDIUM) tablet 100 mg  100 mg Oral Daily PRN Reese, Stephanie M, PA-C       prochlorperazine (COMPAZINE) injection 10 mg  10 mg Intravenous Q6H PRN Kyle, Tyrone A, DO   10 mg at 10/01/22 0935   sodium chloride flush (NS) 0.9 % injection 10 mL  10 mL Intrapleural Q8H Reese, Stephanie M, PA-C       sodium chloride flush (NS) 0.9 % injection 10-40 mL  10-40 mL Intracatheter Q12H Kyle, Tyrone A, DO   10 mL at 10/01/22 1430   sodium chloride flush (NS) 0.9 % injection 10-40 mL  10-40 mL Intracatheter PRN Kyle, Tyrone A, DO       talc (STERITALC) 4 g in sodium chloride 0.9 % syringe  4 g Intrapleural Once Reese, Stephanie  M, PA-C        REVIEW OF SYSTEMS:  On review of systems, the patient reports that *** is doing well overall. *** denies any chest pain, shortness of breath, cough, fevers, chills, night sweats, unintended weight changes. *** denies any bowel or bladder disturbances, and denies abdominal pain, nausea or vomiting. *** denies any new musculoskeletal or joint aches or pains. A complete review of   systems is obtained and is otherwise negative.    PHYSICAL EXAM:  Wt Readings from Last 3 Encounters:  10/01/22 166 lb 10.7 oz (75.6 kg)  09/06/22 176 lb (79.8 kg)  08/02/22 182 lb 6.4 oz (82.7 kg)   Temp Readings from Last 3 Encounters:  10/01/22 98.5 F (36.9 C) (Oral)  09/19/22 98.7 F (37.1 C)  09/06/22 98.3 F (36.8 C) (Oral)   BP Readings from Last 3 Encounters:  10/01/22 (!) 154/78  09/19/22 123/78  09/17/22 (!) 146/89   Pulse Readings from Last 3 Encounters:  10/01/22 (!) 116  09/19/22 97  09/06/22 90    /10  In general this is a well appearing *** in no acute distress. *** is alert and oriented x4 and appropriate throughout the examination. HEENT reveals that the patient is normocephalic, atraumatic. EOMs are intact. PERRLA. Skin is intact without any evidence of gross lesions. Cardiovascular exam reveals a regular rate and rhythm, no clicks rubs or murmurs are auscultated. Chest is clear to auscultation bilaterally. Lymphatic assessment is performed and does not reveal any adenopathy in the cervical, supraclavicular, axillary, or inguinal chains. Abdomen has active bowel sounds in all quadrants and is intact. The abdomen is soft, non tender, non distended. Lower extremities are negative for pretibial pitting edema, deep calf tenderness, cyanosis or clubbing.   KPS = ***  100 - Normal; no complaints; no evidence of disease. 90   - Able to carry on normal activity; minor signs or symptoms of disease. 80   - Normal activity with effort; some signs or symptoms of disease. 70   -  Cares for self; unable to carry on normal activity or to do active work. 60   - Requires occasional assistance, but is able to care for most of his personal needs. 50   - Requires considerable assistance and frequent medical care. 40   - Disabled; requires special care and assistance. 30   - Severely disabled; hospital admission is indicated although death not imminent. 20   - Very sick; hospital admission necessary; active supportive treatment necessary. 10   - Moribund; fatal processes progressing rapidly. 0     - Dead  Karnofsky DA, Abelmann WH, Craver LS and Burchenal JH (1948) The use of the nitrogen mustards in the palliative treatment of carcinoma: with particular reference to bronchogenic carcinoma Cancer 1 634-56  LABORATORY DATA:  Lab Results  Component Value Date   WBC 6.7 10/01/2022   HGB 11.0 (L) 10/01/2022   HCT 35.2 (L) 10/01/2022   MCV 89.6 10/01/2022   PLT 187 10/01/2022   Lab Results  Component Value Date   NA 138 10/01/2022   K 3.8 10/01/2022   CL 103 10/01/2022   CO2 23 10/01/2022   Lab Results  Component Value Date   ALT 13 09/06/2022   AST 28 09/06/2022   ALKPHOS 185 (H) 09/06/2022   BILITOT 0.6 09/06/2022     RADIOGRAPHY: DG Chest 2 View  Result Date: 10/01/2022 CLINICAL DATA:  Chest pain EXAM: CHEST - 2 VIEW COMPARISON:  09/17/2022 FINDINGS: Cardiac shadow is stable. Increasing right-sided pleural effusion is noted when compared with the prior exam. Left chest wall port is again seen. No acute bony abnormality is noted. IMPRESSION: Increasing right-sided pleural effusion. Electronically Signed   By: Mark  Lukens M.D.   On: 10/01/2022 03:37   MR Brain W and Wo Contrast  Result Date: 09/19/2022 CLINICAL DATA:  Visual deficit and possible CN 6 palsy. Multiple myeloma EXAM: MRI   HEAD AND ORBITS WITHOUT AND WITH CONTRAST TECHNIQUE: Multiplanar, multiecho pulse sequences of the brain and surrounding structures were obtained without and with intravenous  contrast. Multiplanar, multiecho pulse sequences of the orbits and surrounding structures were obtained including fat saturation techniques, before and after intravenous contrast administration. CONTRAST:  65m GADAVIST GADOBUTROL 1 MMOL/ML IV SOLN COMPARISON:  None Available. FINDINGS: MRI HEAD FINDINGS Brain: There is no acute infarct or acute hemorrhage. There is a homogeneously contrast-enhancing, left asymmetric prepontine mass that measures 4.3 x 2.4 x 4.6 cm. This lifts the pituitary gland. The mass involves the clivus. This location is likely to affect CN 6 transit through Dorello's canal. The mass may also extend into the hypoglossal canal and jugular foramen on the left. Vascular: Major flow voids are preserved. Skull and upper cervical spine: Aside from the above described lesion of the clivus, there are contrast-enhancing lesions of the right frontal, right parietal, left parietal and left temporal bones. There is dural thickening underlying the larger of these lesions at the right frontal, left temporal and inferior right parietal bones. MRI ORBITS FINDINGS Orbits: No traumatic or inflammatory finding. Globes, optic nerves, orbital fat, extraocular muscles, vascular structures, and lacrimal glands are normal. Visualized sinuses: Clear. Soft tissues: Negative. IMPRESSION: 1. Homogeneously contrast-enhancing, left asymmetric prepontine mass measuring 4.3 x 2.4 x 4.6 cm. The mass involves the clivus and is likely to affect CN 6 transit through Dorello's canal. The mass may also extend into the hypoglossal canal and jugular foramen on the left. In the context of multiple myeloma, this is most consistent with plasmacytoma. 2. Multiple other calvarial lesions, consistent with multiple myeloma. 3. Normal MRI of the orbits. Electronically Signed   By: KUlyses JarredM.D.   On: 09/19/2022 22:58   MR ORBITS W WO CONTRAST  Result Date: 09/19/2022 CLINICAL DATA:  Visual deficit and possible CN 6 palsy. Multiple  myeloma EXAM: MRI HEAD AND ORBITS WITHOUT AND WITH CONTRAST TECHNIQUE: Multiplanar, multiecho pulse sequences of the brain and surrounding structures were obtained without and with intravenous contrast. Multiplanar, multiecho pulse sequences of the orbits and surrounding structures were obtained including fat saturation techniques, before and after intravenous contrast administration. CONTRAST:  819mGADAVIST GADOBUTROL 1 MMOL/ML IV SOLN COMPARISON:  None Available. FINDINGS: MRI HEAD FINDINGS Brain: There is no acute infarct or acute hemorrhage. There is a homogeneously contrast-enhancing, left asymmetric prepontine mass that measures 4.3 x 2.4 x 4.6 cm. This lifts the pituitary gland. The mass involves the clivus. This location is likely to affect CN 6 transit through Dorello's canal. The mass may also extend into the hypoglossal canal and jugular foramen on the left. Vascular: Major flow voids are preserved. Skull and upper cervical spine: Aside from the above described lesion of the clivus, there are contrast-enhancing lesions of the right frontal, right parietal, left parietal and left temporal bones. There is dural thickening underlying the larger of these lesions at the right frontal, left temporal and inferior right parietal bones. MRI ORBITS FINDINGS Orbits: No traumatic or inflammatory finding. Globes, optic nerves, orbital fat, extraocular muscles, vascular structures, and lacrimal glands are normal. Visualized sinuses: Clear. Soft tissues: Negative. IMPRESSION: 1. Homogeneously contrast-enhancing, left asymmetric prepontine mass measuring 4.3 x 2.4 x 4.6 cm. The mass involves the clivus and is likely to affect CN 6 transit through Dorello's canal. The mass may also extend into the hypoglossal canal and jugular foramen on the left. In the context of multiple myeloma, this is most consistent with plasmacytoma. 2. Multiple  other calvarial lesions, consistent with multiple myeloma. 3. Normal MRI of the  orbits. Electronically Signed   By: Ulyses Jarred M.D.   On: 09/19/2022 22:58   DG Chest 1 View  Result Date: 09/17/2022 CLINICAL DATA:  Status post thoracentesis EXAM: CHEST  1 VIEW COMPARISON:  June 22, 2022 chest x-ray FINDINGS: A left Port-A-Cath is in good position. The cardiomediastinal silhouette is stable. The patient is status post right thoracentesis without pneumothorax. The right effusion is smaller in the interval. The opacity in the medial right lung base may represent atelectasis from the recent effusion. No other significant abnormalities. IMPRESSION: 1. No pneumothorax after right thoracentesis. The right effusion is smaller in the interval. 2. Opacity in the medial right lung base may represent atelectasis from the recent effusion. Electronically Signed   By: Dorise Bullion III M.D.   On: 09/17/2022 19:57   US Thoracentesis Asp Pleural space w/IMG guide  Result Date: 09/17/2022 INDICATION: History of multiple myeloma, recurrent pleural effusion EXAM: ULTRASOUND GUIDED RIGHT THORACENTESIS MEDICATIONS: None. COMPLICATIONS: None immediate. PROCEDURE: An ultrasound guided thoracentesis was thoroughly discussed with the patient and questions answered. The benefits, risks, alternatives and complications were also discussed. The patient understands and wishes to proceed with the procedure. Written consent was obtained. Ultrasound was performed to localize and mark an adequate pocket of fluid in the right chest. The area was then prepped and draped in the normal sterile fashion. 1% Lidocaine was used for local anesthesia. Under ultrasound guidance a 6 Fr Safe-T-Centesis catheter was introduced. Thoracentesis was performed. The catheter was removed and a dressing applied. FINDINGS: A total of approximately 1L of pleural fluid was removed. Samples were sent to the laboratory as requested by the clinical team. IMPRESSION: Successful ultrasound guided right thoracentesis yielding 1L of pleural  fluid. Performed and dictated by Pasty Spillers, PA-C Electronically Signed   By: Corrie Mckusick D.O.   On: 09/17/2022 12:39   MR CERVICAL SPINE W WO CONTRAST  Result Date: 09/12/2022 CLINICAL DATA:  Pain in the lower cervical spine and left triceps area; multiple myeloma EXAM: MRI CERVICAL SPINE WITHOUT AND WITH CONTRAST TECHNIQUE: Multiplanar and multiecho pulse sequences of the cervical spine, to include the craniocervical junction and cervicothoracic junction, were obtained without and with intravenous contrast. CONTRAST:  45m GADAVIST GADOBUTROL 1 MMOL/ML IV SOLN COMPARISON:  No prior MRI of the cervical spine. FINDINGS: Alignment: Preservation of the normal cervical lordosis. No listhesis. Vertebrae: Abnormal signal at every cervical level, as well as in the imaged thoracic spine with decreased T1 signal, increased T2 signal, and contrast enhancement. Relative sparing of C7. No evidence of pathologic fracture. Abnormal signal is also noted in the clivus (series 14, image 7), right occipital condyle (series 14, image 12)., bilateral first ribs, and right third rib. There is significant abnormal enhancement along the right aspect of T3 and T4 (series 14, image 4), which is incompletely evaluated on this exam. Cord: Normal signal and morphology.  No abnormal enhancement. Posterior Fossa, vertebral arteries, paraspinal tissues: Large right pleural effusion. Disc levels: C2-C3: No significant disc bulge. No spinal canal stenosis or neuroforaminal narrowing. C3-C4: Small central disc protrusion. No spinal canal stenosis or neural foraminal narrowing. C4-C5: Minimal disc bulge. Mild facet arthropathy. Ligamentum flavum hypertrophy. Mild-to-moderate spinal canal stenosis. Mild right neural foraminal narrowing. C5-C6: Mild disc bulge. Facet and uncovertebral hypertrophy. Mild spinal canal stenosis. Moderate right neural foraminal narrowing. C6-C7: Minimal disc bulge. No spinal canal stenosis or neural foraminal  narrowing. C7-T1: No significant  disc bulge. No spinal canal stenosis or neuroforaminal narrowing. IMPRESSION: 1. Abnormal signal at every cervical level, concerning for metastatic disease. No evidence of pathologic fracture. 2. Abnormal signal in the clivus and right occipital condyle, concerning for additional calvarial metastatic disease. Recommend MRI brain with and without contrast for further evaluation if clinically indicated. 3. Abnormal signal in the imaged thoracic spine, bilateral first ribs, and right third rib, with abnormal masslike enhancement along the right aspect of T3 and T4, which is incompletely evaluated on this exam. Recommend MRI thoracic spine with and without contrast if clinically indicated. 4. C4-C5 mild-to-moderate spinal canal stenosis and mild right neural foraminal narrowing. 5. C5-C6 mild spinal canal stenosis and moderate right neural foraminal narrowing. 6. Large right pleural effusion. These results will be called to the ordering clinician or representative by the Radiologist Assistant, and communication documented in the PACS or [facility] Dashboard. Electronically Signed   By: Alison  Vasan M.D.   On: 09/12/2022 21:19      IMPRESSION/PLAN: 1. 61 y.o. ***    I personally spent *** minutes in this encounter including chart review, reviewing radiological studies, meeting face-to-face with the patient, entering orders and completing documentation.    Ellie Muir, PA -----------------------------------   , MD   

## 2022-10-01 NOTE — ED Triage Notes (Signed)
Patient arrived with complaints of shortness of breath and right sided chest tightness.

## 2022-10-01 NOTE — H&P (Addendum)
History and Physical    Patient: Michelle [name] ZOX:[id number] DOB: 02/23/1961 DOA: 10/01/2022 DOS: the patient was seen and examined on 10/01/2022 PCP: Leeroy Cha, MD  Patient coming from: Home  Chief Complaint:  Chief Complaint  Patient presents with   Shortness of Breath   HPI: Michelle [name] is a 61 y.o. female with medical history significant of multiple myeloma, HTN, GERD, HLD, anxiety, hypothyroidism, CKD3a. Presenting with shortness of breath and right axillary pain. She describes has constant tightness behind her ribs. Symptoms have been present for the last week. She has become increasingly short of breath. Oxycodone was helping initially, but not as much now. Her symptoms were similar to when she recently had a pleural effusion. So, when they did not improve this morning, she decided to come to the ED for evaluation. She denies any other aggravating or alleviating factors.    Review of Systems: As mentioned in the history of present illness. All other systems reviewed and are negative. Past Medical History:  Diagnosis Date   Anemia of chronic renal failure, stage 3b ([place]) 08/02/2022   Cancer ([place])    Essential hypertension 02/16/2017   Hyperlipemia    Hyperlipidemia 02/16/2017   Hypertension    Hypothyroidism 02/16/2017   Morbid obesity ([place]) 02/16/2017   Multiple myeloma without remission ([place]) 06/20/2022   Past Surgical History:  Procedure Laterality Date   IR IMAGING GUIDED PORT INSERTION  06/17/2022   IR THORACENTESIS ASP PLEURAL SPACE W/IMG GUIDE  06/17/2022   THYROIDECTOMY     Social History:  reports that she quit smoking about 33 years ago. Her smoking use included cigarettes. She has a 7.50 pack-year smoking history. She has never used smokeless tobacco. She reports that she does not drink alcohol and does not use drugs.  Allergies  Allergen Reactions   Penicillins Other (See Comments)    Severe Yeast infection... No deathly reactions   Codeine Nausea  Only   Morphine Other (See Comments)    headache   Tramadol Other (See Comments)    Headaches     Family History  Problem Relation Age of Onset   Diabetes Paternal Grandfather    Stroke Paternal Grandfather    Heart attack Paternal Grandfather    Diabetes Father    Hypertension Father    Stroke Father    Lung cancer Father    Hypertension Mother    Kidney disease Mother    Hypertension Sister    Stroke Paternal Grandmother    Heart attack Maternal Grandmother     Prior to Admission medications   Medication Sig Start Date End Date Taking? Authorizing Provider  diazepam (VALIUM) 5 MG tablet Take thirty minutes before MRI for anxiety. 09/12/22   Volanda Napoleon, MD  fluconazole (DIFLUCAN) 100 MG tablet Take 100 mg by mouth daily. Patient not taking: Reported on 08/02/2022 07/28/22   [provider]  fluticasone (FLONASE) 50 MCG/ACT nasal spray Place 1 spray into both nostrils daily. Patient not taking: Reported on 08/02/2022 07/26/22   [provider]  lactulose (CHRONULAC) 10 GM/15ML solution TAKE 15 MLS (10 G TOTAL) BY MOUTH 2 (TWO) TIMES DAILY. Patient not taking: Reported on 09/06/2022 09/04/22   Volanda Napoleon, MD  levothyroxine (SYNTHROID) 88 MCG tablet 1 TABLET EVERY MORNING ON AN EMPTY STOMACH ORALLY ONCE A DAY 1 DAYS    [provider]  lisinopril-hydrochlorothiazide (ZESTORETIC) 10-12.5 MG tablet Take 1 tablet by mouth daily. 07/29/22   [provider]  metoprolol tartrate (  LOPRESSOR) 25 MG tablet Take 1 tablet (25 mg total) by mouth 2 (two) times daily. 06/26/22   Hongalgi, Lenis Dickinson, MD  Multiple Vitamin (MULTIVITAMIN WITH MINERALS) TABS tablet Take 1 tablet by mouth daily. 06/27/22   Hongalgi, Lenis Dickinson, MD  Oxycodone HCl 10 MG TABS Take 1 tablet (10 mg total) by mouth every 4 (four) hours as needed. 09/13/22   Volanda Napoleon, MD  pantoprazole (PROTONIX) 40 MG tablet Take 1 tablet (40 mg total) by mouth 2 (two) times daily. 06/26/22    Hongalgi, Lenis Dickinson, MD  prochlorperazine (COMPAZINE) 10 MG tablet Take 1 tablet (10 mg total) by mouth every 6 (six) hours as needed for nausea or vomiting. Patient not taking: Reported on 09/06/2022 06/14/22   Tyler Pita, MD  rosuvastatin (CRESTOR) 10 MG tablet TAKE 1 TABLET BY MOUTH EVERY DAY FOR 90 DAYS    [provider]  senna (SENOKOT) 8.6 MG TABS tablet Take 1 tablet (8.6 mg total) by mouth daily as needed for mild constipation or moderate constipation. 06/26/22   Modena Jansky, MD    Physical Exam: Vitals:   10/01/22 0315  BP: (!) 143/83  Pulse: 100  Resp: (!) 22  Temp: 98.1 F (36.7 C)  TempSrc: Oral  SpO2: 100%  Weight: 79.4 kg  Height: _0  (1.626 m)   General: 61 y.o. female resting in bed in NAD Eyes: PERRL, normal sclera ENMT: Nares patent w/o discharge, orophaynx clear, dentition normal, ears w/o discharge/lesions/ulcers Neck: Supple, trachea midline Cardiovascular: tachy, +S1, S2, no m/g/r, equal pulses throughout Respiratory: decreased at right base, otherwise clear, no w/r/r, normal WOB on RA GI: BS+, NDNT, no masses noted, no organomegaly noted MSK: No e/c/c Skin: No rashes, bruises, ulcerations noted Neuro: A&O x 3, no focal deficits Psyc: Appropriate interaction and affect, calm/cooperative  Data Reviewed:  Results for orders placed or performed during the hospital encounter of 10/01/22 (from the past 24 hour(s))  Basic metabolic panel     Status: Abnormal   Collection Time: 10/01/22  5:20 AM  Result Value Ref Range   Sodium 138 135 - 145 mmol/L   Potassium 3.8 3.5 - 5.1 mmol/L   Chloride 103 98 - 111 mmol/L   CO2 23 22 - 32 mmol/L   Glucose, Bld 112 (H) 70 - 99 mg/dL   BUN 8 8 - 23 mg/dL   Creatinine, Ser 1.25 (H) 0.44 - 1.00 mg/dL   Calcium 9.7 8.9 - 10.3 mg/dL   GFR, Estimated 48 (L) >60 mL/min   Anion gap 12 5 - 15  CBC     Status: Abnormal   Collection Time: 10/01/22  5:20 AM  Result Value Ref Range   WBC 6.7 4.0 - 10.5 K/uL    RBC 3.93 3.87 - 5.11 MIL/uL   Hemoglobin 11.0 (L) 12.0 - 15.0 g/dL   HCT 35.2 (L) 36.0 - 46.0 %   MCV 89.6 80.0 - 100.0 fL   MCH 28.0 26.0 - 34.0 pg   MCHC 31.3 30.0 - 36.0 g/dL   RDW 15.3 11.5 - 15.5 %   Platelets 187 150 - 400 K/uL   nRBC 0.0 0.0 - 0.2 %  Troponin I (High Sensitivity)     Status: None   Collection Time: 10/01/22  5:20 AM  Result Value Ref Range   Troponin I (High Sensitivity) 4 <18 ng/L   CXR: Increasing right-sided pleural effusion.   Assessment and Plan: Right pleural effusion     - placed in  obs, med-surg     - malignant cells were found in her tap from 06/17/22 and 09/17/22; spoke with [facility]: they wanted to see if pulm can do a pleurodesis; I've spoke with pulm, they will see her; appreciate everyone's assistance     - pain control  MM     - follows w/ Dr. Marin Olp; continue outpt follow up  CKD3a     - at baseline, watch nephrotoxins  HTN     - continue home regimen when confirmed  HLD     - continue home regimen when confirmed  Hypothyroidism     - continue home regimen when confirmed  GERD     - continue home regimen when confirmed  Anxiety     - continue home regimen when confirmed  Advance Care Planning:   Code Status: FULL  Consults: [facility], pulmonology  Family Communication: w/ at bedside  Severity of Illness: The appropriate patient status for this patient is OBSERVATION. Observation status is judged to be reasonable and necessary in order to provide the required intensity of service to ensure the patient's safety. The patient's presenting symptoms, physical exam findings, and initial radiographic and laboratory data in the context of their medical condition is felt to place them at decreased risk for further clinical deterioration. Furthermore, it is anticipated that the patient will be medically stable for discharge from the hospital within 2 midnights of admission.   Author: Jonnie Finner, DO 10/01/2022 7:52 AM  For on call  review [email or web address].

## 2022-10-01 NOTE — Procedures (Signed)
Insertion of Chest Tube Procedure Note  Michelle [name]  [id number]  Nov 29, 1960  Date:10/01/22  Time:5:36 PM   Provider Performing: Lestine Mount   Procedure: Pleural Catheter Insertion w/ Imaging Guidance [phone])  Indication(s): Effusion  Consent: Risks of the procedure as well as the alternatives and risks of each were explained to the patient and/or caregiver.  Consent for the procedure was obtained and is signed in the bedside chart  Anesthesia: Topical only with 1% lidocaine   Time Out: Verified patient identification, verified procedure, site/side was marked, verified correct patient position, special equipment/implants available, medications/allergies/relevant history reviewed, required imaging and test results available.  Sterile Technique: Maximal sterile technique including full sterile barrier drape, hand hygiene, sterile gown, sterile gloves, mask, hair covering, sterile ultrasound probe cover (if used).  Procedure Description: Ultrasound used to identify appropriate pleural anatomy for placement and overlying skin marked. Area of placement cleaned and draped in sterile fashion.  A 14 French pigtail pleural catheter was placed into the right pleural space using Seldinger technique. Appropriate return of straw-colored fluid was obtained.  The tube was connected to atrium and placed on -20 cm H2O wall suction.  Complications/Tolerance: None; patient tolerated the procedure well. Chest X-ray is ordered to verify placement.  EBL: Minimal  Specimen(s): none  The entire procedure was supervised/proctored by Erskine Emery, MD.  Lestine Mount, PA-C [facility] 10/01/22 5:37 PM  Please see [email or web address] for pager details.

## 2022-10-01 NOTE — Addendum Note (Signed)
Encounter addended by: Freeman Caldron, PA-C on: 10/01/2022 9:03 AM  Actions taken: Clinical Note Signed

## 2022-10-01 NOTE — Consult Note (Addendum)
NAME:  Michelle [name], MRN:  [id number], DOB:  06/11/1961, LOS: 0 ADMISSION DATE:  10/01/2022 CONSULTATION DATE:  10/01/2022 REFERRING MD:  Marylyn Ishihara - [facility] CHIEF COMPLAINT:  Right pleural effusion   History of Present Illness:  61 year old woman who presented to [facility] 11/28 with SOB and R-sided chest tightness. Recent thora 11/14 for R pleural effusion. PMHx significant for HTN, HLD, hypothyroidism, CKD stage 3b, multiple myeloma and bony metastasis (likely 2/2 multiple myeloma), prior tobacco use. Recent admission 8/11-8/23 for back pain in the setting of worsening widespread tumor infiltration and loculated R pleural effusion. S/p thoracentesis 8/14 and 11/14 with malignant cells on cytology (atypical plasmacytoid cells).  PCCM consulted for management of R pleural effusion with plan for pigtail placement and talc pleurodesis.  Pertinent Medical History:   Past Medical History:  Diagnosis Date   Anemia of chronic renal failure, stage 3b ([place]) 08/02/2022   Cancer ([place])    Essential hypertension 02/16/2017   Hyperlipemia    Hyperlipidemia 02/16/2017   Hypertension    Hypothyroidism 02/16/2017   Morbid obesity ([place]) 02/16/2017   Multiple myeloma without remission ([place]) 06/20/2022   Significant Hospital Events: Including procedures, antibiotic start and stop dates in addition to other pertinent events   11/28 - Presented [facility] with SOB, R-sided chest pain. Moderate to large R-sided pleural effusion. PCCM consulted for pigtail placement/talc pleurodesis.  Interim History / Subjective:  PCCM consulted for pigtail placement/talc pleurodesis  Objective:  Blood pressure (!) 143/83, pulse 100, temperature 98.5 F (36.9 C), temperature source Oral, resp. rate (!) 22, height _0  (1.626 m), weight 75.6 kg, SpO2 100 %.       No intake or output data in the 24 hours ending 10/01/22 1204 Filed Weights   10/01/22 0315 10/01/22 1131  Weight: 79.4 kg 75.6 kg   Physical Examination: General: Overall  well-appearing middle-aged woman in NAD. HEENT: [place]/AT, anicteric sclera, PERRL, moist mucous membranes. Neuro: Awake, oriented x 4. Responds to verbal stimuli. Following commands consistently. Moves all 4 extremities spontaneously. CV: RRR, no m/g/r. PULM: Breathing even and unlabored on RA. Lung fields diminished on R. GI: Soft, nontender, nondistended. Normoactive bowel sounds. Extremities: No significant LE edema noted. Skin: Warm/dry, no rashes.  Resolved Hospital Problem List:    Assessment & Plan:  Michelle [name] is seen in consultation at the request of Dr. Marylyn Ishihara [facility]) for further evaluation and management of right pleural effusion.  61 year old woman who presented to [facility] 11/28 with SOB and R-sided chest tightness. Recent thora 11/14 for R pleural effusion. Recent admission 8/11-8/23 for back pain in the setting of worsening widespread tumor infiltration and loculated R pleural effusion. S/p thoracentesis 8/14 and 11/14 with malignant cells on cytology (atypical plasmacytoid cells). Recurrent effusion that may benefit from chest tube placement and talc pleurodesis.  Moderate to large right-sided pleural effusion CXR 11/28 with increasing R-sided effusion. - Pigtail placement at bedside with PCCM - Talc pleurodesis to be completed after chest tube placement - Consent signed and in chart - PRN pain meds/sedation ordered for procedure  Dysuria Urgency/frequency - Obtain UA/UCx - Pyridium daily for dysuria  Best Practice: (right click and "Reselect all SmartList Selections" daily)   Per Primary Team  Labs:  CBC: Recent Labs  Lab 10/01/22 0520  WBC 6.7  HGB 11.0*  HCT 35.2*  MCV 89.6  PLT 563   Basic Metabolic Panel: Recent Labs  Lab 10/01/22 0520  NA 138  K 3.8  CL 103  CO2 23  GLUCOSE 112*  BUN 8  CREATININE 1.25*  CALCIUM 9.7   GFR: Estimated Creatinine Clearance: 45.9 mL/min (A) (by C-G formula based on SCr of 1.25 mg/dL (H)). Recent Labs  Lab 10/01/22 0520   WBC 6.7   Liver Function Tests: No results for input(s): "AST", "ALT", "ALKPHOS", "BILITOT", "PROT", "ALBUMIN" in the last 168 hours. No results for input(s): "LIPASE", "AMYLASE" in the last 168 hours. No results for input(s): "AMMONIA" in the last 168 hours.  ABG: No results found for: "PHART", "PCO2ART", "PO2ART", "HCO3", "TCO2", "ACIDBASEDEF", "O2SAT"   Coagulation Profile: No results for input(s): "INR", "PROTIME" in the last 168 hours.  Cardiac Enzymes: No results for input(s): "CKTOTAL", "CKMB", "CKMBINDEX", "TROPONINI" in the last 168 hours.  HbA1C: Hgb A1c MFr Bld  Date/Time Value Ref Range Status  05/09/2010 08:54 PM 5.5 <5.7 % Final    Comment:    See lab report for associated comment(s)  12/11/2009 08:24 PM 6.1 4.6 - 6.1 % Final    Comment:    See lab report for associated comment(s)   CBG: No results for input(s): "GLUCAP" in the last 168 hours.  Review of Systems:   Review of systems completed with pertinent positives/negatives outlined in above HPI.  Past Medical History:  She,  has a past medical history of Anemia of chronic renal failure, stage 3b ([place]) (08/02/2022), Cancer ([place]), Essential hypertension (02/16/2017), Hyperlipemia, Hyperlipidemia (02/16/2017), Hypertension, Hypothyroidism (02/16/2017), Morbid obesity ([place]) (02/16/2017), and Multiple myeloma without remission ([place]) (06/20/2022).   Surgical History:   Past Surgical History:  Procedure Laterality Date   IR IMAGING GUIDED PORT INSERTION  06/17/2022   IR THORACENTESIS ASP PLEURAL SPACE W/IMG GUIDE  06/17/2022   THYROIDECTOMY     Social History:   reports that she quit smoking about 33 years ago. Her smoking use included cigarettes. She has a 7.50 pack-year smoking history. She has never used smokeless tobacco. She reports that she does not drink alcohol and does not use drugs.   Family History:  Her family history includes Diabetes in her father and paternal grandfather; Heart attack in her  maternal grandmother and paternal grandfather; Hypertension in her father, mother, and sister; Kidney disease in her mother; Lung cancer in her father; Stroke in her father, paternal grandfather, and paternal grandmother.   Allergies: Allergies  Allergen Reactions   Penicillins Other (See Comments)    Severe Yeast infection... No deathly reactions   Codeine Nausea Only   Morphine Other (See Comments)    headache   Tramadol Other (See Comments)    Headaches    Home Medications: Prior to Admission medications   Medication Sig Start Date End Date Taking? Authorizing Provider  diazepam (VALIUM) 5 MG tablet Take thirty minutes before MRI for anxiety. 09/12/22   Volanda Napoleon, MD  fluconazole (DIFLUCAN) 100 MG tablet Take 100 mg by mouth daily. Patient not taking: Reported on 08/02/2022 07/28/22   [provider]  fluticasone (FLONASE) 50 MCG/ACT nasal spray Place 1 spray into both nostrils daily. Patient not taking: Reported on 08/02/2022 07/26/22   [provider]  lactulose (CHRONULAC) 10 GM/15ML solution TAKE 15 MLS (10 G TOTAL) BY MOUTH 2 (TWO) TIMES DAILY. Patient not taking: Reported on 09/06/2022 09/04/22   Volanda Napoleon, MD  levothyroxine (SYNTHROID) 88 MCG tablet 1 TABLET EVERY MORNING ON AN EMPTY STOMACH ORALLY ONCE A DAY 63 DAYS    [provider]  lisinopril-hydrochlorothiazide (ZESTORETIC) 10-12.5 MG tablet Take 1 tablet by mouth daily. 07/29/22  [provider]  metoprolol tartrate (LOPRESSOR) 25 MG tablet Take 1 tablet (25 mg total) by mouth 2 (two) times daily. 06/26/22   Hongalgi, Lenis Dickinson, MD  Multiple Vitamin (MULTIVITAMIN WITH MINERALS) TABS tablet Take 1 tablet by mouth daily. 06/27/22   Hongalgi, Lenis Dickinson, MD  Oxycodone HCl 10 MG TABS Take 1 tablet (10 mg total) by mouth every 4 (four) hours as needed. 09/13/22   Volanda Napoleon, MD  pantoprazole (PROTONIX) 40 MG tablet Take 1 tablet (40 mg total) by mouth 2 (two) times daily. 06/26/22    Hongalgi, Lenis Dickinson, MD  prochlorperazine (COMPAZINE) 10 MG tablet Take 1 tablet (10 mg total) by mouth every 6 (six) hours as needed for nausea or vomiting. Patient not taking: Reported on 09/06/2022 06/14/22   Tyler Pita, MD  rosuvastatin (CRESTOR) 10 MG tablet TAKE 1 TABLET BY MOUTH EVERY DAY FOR 90 DAYS    [provider]  senna (SENOKOT) 8.6 MG TABS tablet Take 1 tablet (8.6 mg total) by mouth daily as needed for mild constipation or moderate constipation. 06/26/22   Modena Jansky, MD   Critical care time: N/A   Rhae Lerner [facility] 10/01/22 12:04 PM  Please see [email or web address] for pager details.  From 7A-7P if no response, please call [phone] After hours, please call ELink [phone]

## 2022-10-02 ENCOUNTER — Other Ambulatory Visit: Payer: Self-pay

## 2022-10-02 ENCOUNTER — Observation Stay (HOSPITAL_COMMUNITY): Payer: BC Managed Care – PPO

## 2022-10-02 ENCOUNTER — Ambulatory Visit
Admission: RE | Admit: 2022-10-02 | Discharge: 2022-10-02 | Disposition: A | Discharge status: Home / Self Care | Payer: BC Managed Care – PPO | Source: Ambulatory Visit | Attending: Radiation Oncology | Admitting: Radiation Oncology

## 2022-10-02 ENCOUNTER — Encounter: Payer: Self-pay | Admitting: Radiation Oncology

## 2022-10-02 ENCOUNTER — Ambulatory Visit
Admit: 2022-10-02 | Discharge: 2022-10-02 | Disposition: A | Discharge status: Home / Self Care | Payer: BC Managed Care – PPO | Attending: Radiation Oncology | Admitting: Radiation Oncology

## 2022-10-02 ENCOUNTER — Telehealth: Payer: Self-pay | Admitting: Internal Medicine

## 2022-10-02 DIAGNOSIS — J910 Malignant pleural effusion: Secondary | ICD-10-CM

## 2022-10-02 DIAGNOSIS — D649 Anemia, unspecified: Secondary | ICD-10-CM

## 2022-10-02 DIAGNOSIS — D631 Anemia in chronic kidney disease: Secondary | ICD-10-CM | POA: Diagnosis present

## 2022-10-02 DIAGNOSIS — Z823 Family history of stroke: Secondary | ICD-10-CM | POA: Diagnosis not present

## 2022-10-02 DIAGNOSIS — M79621 Pain in right upper arm: Secondary | ICD-10-CM | POA: Diagnosis present

## 2022-10-02 DIAGNOSIS — Z87891 Personal history of nicotine dependence: Secondary | ICD-10-CM | POA: Diagnosis not present

## 2022-10-02 DIAGNOSIS — C7951 Secondary malignant neoplasm of bone: Secondary | ICD-10-CM | POA: Diagnosis present

## 2022-10-02 DIAGNOSIS — Z885 Allergy status to narcotic agent status: Secondary | ICD-10-CM | POA: Diagnosis not present

## 2022-10-02 DIAGNOSIS — C9000 Multiple myeloma not having achieved remission: Principal | ICD-10-CM

## 2022-10-02 DIAGNOSIS — J948 Other specified pleural conditions: Secondary | ICD-10-CM

## 2022-10-02 DIAGNOSIS — Z79899 Other long term (current) drug therapy: Secondary | ICD-10-CM | POA: Diagnosis not present

## 2022-10-02 DIAGNOSIS — F419 Anxiety disorder, unspecified: Secondary | ICD-10-CM | POA: Diagnosis present

## 2022-10-02 DIAGNOSIS — N1832 Chronic kidney disease, stage 3b: Secondary | ICD-10-CM | POA: Diagnosis present

## 2022-10-02 DIAGNOSIS — Z880 Allergy status to penicillin: Secondary | ICD-10-CM | POA: Diagnosis not present

## 2022-10-02 DIAGNOSIS — Z841 Family history of disorders of kidney and ureter: Secondary | ICD-10-CM | POA: Diagnosis not present

## 2022-10-02 DIAGNOSIS — E785 Hyperlipidemia, unspecified: Secondary | ICD-10-CM | POA: Diagnosis present

## 2022-10-02 DIAGNOSIS — E890 Postprocedural hypothyroidism: Secondary | ICD-10-CM | POA: Diagnosis present

## 2022-10-02 DIAGNOSIS — Z833 Family history of diabetes mellitus: Secondary | ICD-10-CM | POA: Diagnosis not present

## 2022-10-02 DIAGNOSIS — Z801 Family history of malignant neoplasm of trachea, bronchus and lung: Secondary | ICD-10-CM | POA: Diagnosis not present

## 2022-10-02 DIAGNOSIS — I129 Hypertensive chronic kidney disease with stage 1 through stage 4 chronic kidney disease, or unspecified chronic kidney disease: Secondary | ICD-10-CM | POA: Diagnosis present

## 2022-10-02 DIAGNOSIS — Z79890 Hormone replacement therapy (postmenopausal): Secondary | ICD-10-CM | POA: Diagnosis not present

## 2022-10-02 DIAGNOSIS — Z8249 Family history of ischemic heart disease and other diseases of the circulatory system: Secondary | ICD-10-CM | POA: Diagnosis not present

## 2022-10-02 DIAGNOSIS — K219 Gastro-esophageal reflux disease without esophagitis: Secondary | ICD-10-CM | POA: Diagnosis present

## 2022-10-02 LAB — COMPREHENSIVE METABOLIC PANEL
ALT: 12 U/L (ref 0–44)
AST: 28 U/L (ref 15–41)
Albumin: 3.1 g/dL — ABNORMAL LOW (ref 3.5–5.0)
Alkaline Phosphatase: 82 U/L (ref 38–126)
Anion gap: 9 (ref 5–15)
BUN: 11 mg/dL (ref 8–23)
CO2: 25 mmol/L (ref 22–32)
Calcium: 9.4 mg/dL (ref 8.9–10.3)
Chloride: 104 mmol/L (ref 98–111)
Creatinine, Ser: 1.34 mg/dL — ABNORMAL HIGH (ref 0.44–1.00)
GFR, Estimated: 45 mL/min — ABNORMAL LOW (ref >60)
Glucose, Bld: 98 mg/dL (ref 70–99)
Potassium: 3.5 mmol/L (ref 3.5–5.1)
Sodium: 138 mmol/L (ref 135–145)
Total Bilirubin: 0.8 mg/dL (ref 0.3–1.2)
Total Protein: 5.9 g/dL — ABNORMAL LOW (ref 6.5–8.1)

## 2022-10-02 LAB — CBC
HCT: 30.6 % — ABNORMAL LOW (ref 36.0–46.0)
Hemoglobin: 9.6 g/dL — ABNORMAL LOW (ref 12.0–15.0)
MCH: 27.8 pg (ref 26.0–34.0)
MCHC: 31.4 g/dL (ref 30.0–36.0)
MCV: 88.7 fL (ref 80.0–100.0)
Platelets: 154 10*3/uL (ref 150–400)
RBC: 3.45 MIL/uL — ABNORMAL LOW (ref 3.87–5.11)
RDW: 15.6 % — ABNORMAL HIGH (ref 11.5–15.5)
WBC: 6 10*3/uL (ref 4.0–10.5)
nRBC: 0 % (ref 0.0–0.2)

## 2022-10-02 LAB — URINE CULTURE

## 2022-10-02 LAB — HIV ANTIBODY (ROUTINE TESTING W REFLEX): HIV Screen 4th Generation wRfx: NONREACTIVE

## 2022-10-02 MED ORDER — LISINOPRIL 10 MG PO TABS
10.0000 mg | ORAL_TABLET | Freq: Every day | ORAL | Status: DC
  Administered 2022-10-02 – 2022-10-03 (×2): 10 mg via ORAL
  Filled 2022-10-02 (×2): qty 1

## 2022-10-02 MED ORDER — HYDROCHLOROTHIAZIDE 12.5 MG PO TABS
12.5000 mg | ORAL_TABLET | Freq: Every day | ORAL | Status: DC
  Administered 2022-10-02 – 2022-10-03 (×2): 12.5 mg via ORAL
  Filled 2022-10-02 (×2): qty 1

## 2022-10-02 MED ORDER — LORAZEPAM 2 MG/ML IJ SOLN
0.5000 mg | Freq: Once | INTRAMUSCULAR | Status: AC
  Administered 2022-10-02: 0.5 mg via INTRAVENOUS
  Filled 2022-10-02: qty 1

## 2022-10-02 MED ORDER — LACTULOSE 10 GM/15ML PO SOLN
10.0000 g | Freq: Two times a day (BID) | ORAL | Status: DC
  Administered 2022-10-02 – 2022-10-03 (×3): 10 g via ORAL
  Filled 2022-10-02 (×3): qty 15

## 2022-10-02 MED ORDER — TALC (STERITALC) POWDER FOR INTRAPLEURAL USE
4.0000 g | Freq: Once | INTRAVENOUS | Status: AC
  Administered 2022-10-02: 4 g via INTRAPLEURAL
  Filled 2022-10-02: qty 4

## 2022-10-02 MED ORDER — ENOXAPARIN SODIUM 40 MG/0.4ML IJ SOSY
40.0000 mg | PREFILLED_SYRINGE | Freq: Every day | INTRAMUSCULAR | Status: DC
  Administered 2022-10-02 – 2022-10-03 (×2): 40 mg via SUBCUTANEOUS
  Filled 2022-10-02 (×2): qty 0.4

## 2022-10-02 MED ORDER — METOPROLOL TARTRATE 25 MG PO TABS: 25.0000 mg | ORAL_TABLET | Freq: Once | ORAL | Status: DC

## 2022-10-02 MED ORDER — LEVOTHYROXINE SODIUM 88 MCG PO TABS
88.0000 ug | ORAL_TABLET | Freq: Every day | ORAL | Status: DC
  Administered 2022-10-02 – 2022-10-03 (×2): 88 ug via ORAL
  Filled 2022-10-02 (×2): qty 1

## 2022-10-02 MED ORDER — ROSUVASTATIN CALCIUM 10 MG PO TABS
10.0000 mg | ORAL_TABLET | Freq: Every evening | ORAL | Status: DC
  Administered 2022-10-02: 10 mg via ORAL
  Filled 2022-10-02: qty 1

## 2022-10-02 MED ORDER — LORAZEPAM 1 MG PO TABS
1.0000 mg | ORAL_TABLET | ORAL | Status: DC
  Filled 2022-10-02: qty 1

## 2022-10-02 MED ORDER — PANTOPRAZOLE SODIUM 40 MG PO TBEC
40.0000 mg | DELAYED_RELEASE_TABLET | Freq: Two times a day (BID) | ORAL | Status: DC
  Administered 2022-10-02 – 2022-10-03 (×3): 40 mg via ORAL
  Filled 2022-10-02 (×3): qty 1

## 2022-10-02 MED ORDER — LISINOPRIL-HYDROCHLOROTHIAZIDE 10-12.5 MG PO TABS: 1.0000 | ORAL_TABLET | Freq: Every day | ORAL | Status: DC

## 2022-10-02 MED ORDER — METOPROLOL TARTRATE 25 MG PO TABS
25.0000 mg | ORAL_TABLET | Freq: Two times a day (BID) | ORAL | Status: DC
  Administered 2022-10-02 – 2022-10-03 (×3): 25 mg via ORAL
  Filled 2022-10-02 (×3): qty 1

## 2022-10-02 MED ORDER — LIDOCAINE HCL 1 % IJ SOLN
25.0000 mL | Freq: Once | INTRAMUSCULAR | Status: AC
  Administered 2022-10-02: 25 mL via INTRAPLEURAL
  Filled 2022-10-02: qty 25

## 2022-10-02 MED ORDER — HYDROMORPHONE HCL 1 MG/ML IJ SOLN
1.0000 mg | INTRAMUSCULAR | Status: DC | PRN
  Administered 2022-10-02: 1 mg via INTRAVENOUS
  Filled 2022-10-02: qty 1

## 2022-10-02 NOTE — Progress Notes (Addendum)
10/02/2022   I have seen and evaluated the patient for MPE.  S:  No events. Breathing improved after fluid drainage. Anxious.  O: Blood pressure (!) 142/76, pulse (!) 117, temperature 98.1 F (36.7 C), resp. rate 19, height '5\' 4"'$  (1.626 m), weight 75.6 kg, SpO2 100 %.  No distress but anxious Improved breath sounds R base Chest tube with tidaling, no air leak, minimal fluid output Ext warm Aox3  CXR: R base clear, apical fluid collection noted  A:  MM with pleural spread and recurrent symptomatic malignant pleural effusion- basilar portion drained well, apicolateral collection not reached nor do I think will affect breathing  P:  - Talc pleurodesis, 1 hour dwell then chest tube to suction, if minimal output will remove tube - Will set up OP pulm f/u   Erskine Emery MD [facility] Prefer epic messenger for cross cover needs If after hours, please call E-link

## 2022-10-02 NOTE — Telephone Encounter (Signed)
2 week f/u with CXR with either APP or Dr. Valeta Harms to review success/failure of attempted pleurodesis of malignant pleural effusion

## 2022-10-02 NOTE — Progress Notes (Signed)
Rn Anderson Malta spoke with Eugene Garnet Rn concerning the need for ativan prior to pt ct sim appointment at 1300. Eugene Garnet will contact provider to obtain order for this ativan. Pt stable at this time. Scott called to transport pt back to her room.

## 2022-10-02 NOTE — Progress Notes (Addendum)
10/02/2022  Evaluated after pleurodesis, minimal chest tube output, TTP at chest tube insertion point.  Okay to remove chest tube.  Order placed.  Will arrange OP pulm f/u in 2 weeks to determine if has recurrence; if does next step would be either another attempted pleurodesis or pleurX although not sure she has support structure in place for latter.  Available PRN.  Erskine Emery MD PCCM

## 2022-10-02 NOTE — Telephone Encounter (Signed)
Patient is scheduled on 12/14 at 10am with Derl Barrow, NP- reminder mailed to address on file, nothing further needed.

## 2022-10-02 NOTE — Progress Notes (Signed)
   10/01/22 2227  Vitals  Temp 98.1 F (36.7 C)  BP (!) 139/93  MAP (mmHg) 106  BP Location Right Arm  BP Method Automatic  Patient Position (if appropriate) Lying  Pulse Rate (!) 118  Pulse Rate Source Monitor  Resp 18  MEWS COLOR  MEWS Score Color Yellow  Oxygen Therapy  SpO2 99 %  Pain Assessment  Pain Scale 0-10  Pain Score 0  MEWS Score  MEWS Temp 0  MEWS Systolic 0  MEWS Pulse 2  MEWS RR 0  MEWS LOC 0  MEWS Score 2  Provider Notification  Provider Name/Title Olena Heckle, NP  Date Provider Notified 10/01/22  Time Provider Notified 2230  Method of Notification Page  Notification Reason Other (Comment) (mews yellow d/t elevated HR)  Provider response Other (Comment);No new orders (instructed to give oxy)  Date of Provider Response 10/01/22  Time of Provider Response 2230   Pt's mews yellow d/t elevated HR. MD and charge RN notified. No new orders. Yellow mews initiated per protocol. Plan of care going.

## 2022-10-02 NOTE — Consult Note (Signed)
Ms. Farias is well-known to me.  She is a very nice 61 year old Afro-American female.  She actually presented back in June.  She has IgG kappa myeloma.  She has normal cytogenetics.  On FISH STUDIES, she had a duplication of chromosome 1q.  She has been on chemotherapy.  She has been getting Faspro/Cytoxan/Velcade/Decadron.  She had a good response to date.  However, when we last checked her levels, her Kappa light chain went back up.  The interesting problem is that she has has recurrent right pleural effusion.  She underwent a thoracentesis on 09/17/2022.  This did show plasma cells.  Unfortunate, she was admitted yesterday with some tightness over on the right side.  She had limited shortness of breath.  She had another recurrence of the pleural effusion.  She had 1500 cc of fluid removed.  She has a chest tube in right now.  Hopefully, a pleurodesis will be performed.  When she came in, her white count was 6.7.  Hemoglobin 11.  Platelet count 187,000.  Her sodium was 138.  Potassium 3.8.  BUN 8 creatinine 1.25.  She looks quite good.  I would say she looks a lot better than I would have thought she would look.  She does have the chest tube in.  We did do an MRI of the cervical spine back on 09/12/2022.  She has myelomatous involvement of her bones.  She has had no fever.  She has had no diarrhea.  She has had no bleeding.  She has had no nausea or vomiting.  She did have a nice Thanksgiving.  Her mother came to visit.  She has had no leg swelling.   On physical exam, her vital signs show temperature of 98.1.  Pulse 117.  Blood pressure 142/76.  Her lungs sound clear on the left side.  She has slight decrease on the right side.  Her cardiac exam is tachycardic but regular.  Her abdomen is soft.  She has decent bowel sounds.  There is no fluid wave.  There is no palpable liver or spleen tip.  Extremity shows no clubbing, cyanosis or edema.  Neurological exam shows no focal neurological deficits.   Skin exam shows no rashes, ecchymosis or petechia.   Ms. Dibiasio is a charming 61 year old Afro-American female with IgG kappa myeloma.  We might be looking at disease that could be somewhat resilient.  Again, her last Kappa light chain was up.  I will have to recheck this.  Again I think a pleurodesis would not be a bad idea for her.  Hopefully, this can be done this week.  I may have to make an adjustment with her protocol.  Her renal function is okay.  We may have to think about adding Revlimid.  I may have to think about switching out the Velcade and using Kyprolis.  We will just follow along with her in the hospital right now.  She is little bit more anemic.  I have checked an erythropoietin level on her in the past and it is quite low.  As such, we may consider ESA on her.  I know that she will get incredible care from the wonderful staff upon [address] E.  I do appreciate their compassion.  [name], MD  Jeneen Rinks 1:5

## 2022-10-02 NOTE — Procedures (Signed)
Chest Tube Pleurodesis Note  Michelle [name]  [id number]  Jul 25, 1961  Date:10/02/22  Time:8:39 AM   Provider Performing:Leisha Trinkle C Tamala Julian   Procedure: Chemical Pleurodesis via Chest Tube (32560)  Indication(s) Induced scarring of pleural space  Consent Risks of the procedure as well as the alternatives and risks of each were explained to the patient and/or caregiver.  Consent for the procedure was obtained.   Anesthesia 20cc 1% Lidocaine injected in with pleurodesis agents, 4g talc slurry.   Time Out Verified patient identification, verified procedure, site/side was marked, verified correct patient position, special equipment/implants available, medications/allergies/relevant history reviewed, required imaging and test results available.   Sterile Technique Hand hygiene, gloves   Procedure Description Existing pleural catheter was cleaned and accessed in sterile manner.  Talc 4g in 50cc saline slurry administered into pleural space.  The chest tube will be clamped and patient will rotate positions for 1 hour then chest tube will be placed to suction.   Complications/Tolerance None; patient tolerated the procedure well.   EBL None   Specimen(s) None

## 2022-10-02 NOTE — Progress Notes (Signed)
  Transition of Care [facility]) Screening Note   Patient Details  Name: Michelle [name] Date of Birth: 12-Jun-1961   Transition of Care [facility]) CM/SW Contact:    Vassie Moselle, LCSW Phone Number: 10/02/2022, 11:08 AM    Transition of Care Department [facility]) has reviewed patient and no TOC needs have been identified at this time. We will continue to monitor patient advancement through interdisciplinary progression rounds. If new patient transition needs arise, please place a TOC consult.

## 2022-10-02 NOTE — Progress Notes (Signed)
Consultation Progress Note   Patient: Michelle [name] ULA:[id number] DOB: [birth year] DOA: 10/01/2022 DOS: the patient was seen and examined on 10/02/2022 Primary service: Tauni Sanks, Manfred Shirts, MD  Brief hospital course: 61 y.o. female with medical history significant of multiple myeloma, HTN, GERD, HLD, anxiety, hypothyroidism, CKD3a. Presenting with shortness of breath and right axillary pain. She describes has constant tightness behind her ribs. Symptoms have been present for the last week. She has become increasingly short of breath. Oxycodone was helping initially, but not as much now. Her symptoms were similar to when she recently had a pleural effusion. So, when they did not improve this morning, she decided to come to the ED for evaluation   CXR showed increasing right-sided pleural effusion. Chest tube was placed yesterday and this morning 11/29 she had pleurodesis done via CT. Assessment and Plan: Status post Chemical Pleurodesis via Chest Tube-11/29 Right sided pleural effusion Multiple myeloma -Known history of multiple myeloma who with right-sided pleural effusion.  Known history of malignant pleural effusion pleurodesis Followed by pulmonology. - Continue chest tube appreciate Pulm assistance With chest tube management -Monitor oxygen saturations. Plan for outpatient pulmonology follow-up in place. Oncology has evaluated patient and plan to adjust medications accordingly.  Anemia of chronic disease -Hb dropped to 9 from 11 at presentation. Oncology plans for ESA    CKD3a     - at baseline, watch nephrotoxins   HTN     - continue home regimen when confirmed   HLD     - continue home regimen when confirmed   Hypothyroidism     - continue home regimen when confirmed   GERD     - continue home regimen when confirmed   Anxiety     - continue home regimen when confirmed      TRH will continue to follow the patient.  Subjective: No complaints this morning  Physical  Exam:  Gen Alertnot in acute distress Eyes: PERRL, normal sclera ENMT: Nares patent w/o discharge, orophaynx clear, dentition normal, ears w/o discharge/lesions/ulcers Neck: Supple, trachea midline Cardiovascular: tachy, +S1, S2, no m/g/r, equal pulses throughout Respiratory: Diminished breath sounds otherwise clear, no w/r/r, normal WOB on RA GI: BS+, NDNT, no masses noted, no organomegaly noted MSK: No e/c/c Skin: No rashes, bruises, ulcerations noted Neuro: A&O x 3, no focal deficits Psyc: Appropriate interaction and affect, calm/cooperative Vitals:   10/01/22 2227 10/02/22 0034 10/02/22 0233 10/02/22 0709  BP: (!) 139/93 130/87 127/81 (!) 142/76  Pulse: (!) 118 (!) 114 (!) 113 (!) 117  Resp: 18 (!) _0 Temp: 98.1 F (36.7 C) 97.8 F (36.6 C) 97.9 F (36.6 C) 98.1 F (36.7 C)  TempSrc:      SpO2: 99% 100% 99% 100%  Weight:      Height:        Data Reviewed:  There are no new results to review at this time.  Family Communication:   Time spent: 15 minutes.  Author: Cristela Felt, MD 10/02/2022 10:36 AM  For on call review [email or web address].

## 2022-10-03 ENCOUNTER — Other Ambulatory Visit: Payer: Self-pay

## 2022-10-03 ENCOUNTER — Ambulatory Visit
Admission: RE | Admit: 2022-10-03 | Discharge: 2022-10-03 | Disposition: A | Discharge status: Home / Self Care | Payer: BC Managed Care – PPO | Source: Ambulatory Visit | Attending: Radiation Oncology | Admitting: Radiation Oncology

## 2022-10-03 DIAGNOSIS — J910 Malignant pleural effusion: Secondary | ICD-10-CM | POA: Diagnosis not present

## 2022-10-03 LAB — COMPREHENSIVE METABOLIC PANEL
ALT: 11 U/L (ref 0–44)
AST: 25 U/L (ref 15–41)
Albumin: 3 g/dL — ABNORMAL LOW (ref 3.5–5.0)
Alkaline Phosphatase: 79 U/L (ref 38–126)
Anion gap: 10 (ref 5–15)
BUN: 11 mg/dL (ref 8–23)
CO2: 24 mmol/L (ref 22–32)
Calcium: 9.3 mg/dL (ref 8.9–10.3)
Chloride: 104 mmol/L (ref 98–111)
Creatinine, Ser: 1.53 mg/dL — ABNORMAL HIGH (ref 0.44–1.00)
GFR, Estimated: 38 mL/min — ABNORMAL LOW (ref >60)
Glucose, Bld: 102 mg/dL — ABNORMAL HIGH (ref 70–99)
Potassium: 3.6 mmol/L (ref 3.5–5.1)
Sodium: 138 mmol/L (ref 135–145)
Total Bilirubin: 0.5 mg/dL (ref 0.3–1.2)
Total Protein: 5.7 g/dL — ABNORMAL LOW (ref 6.5–8.1)

## 2022-10-03 LAB — RAD ONC ARIA SESSION SUMMARY
Course Elapsed Days: 0
Plan Fractions Treated to Date: 1
Plan Prescribed Dose Per Fraction: 2 Gy
Plan Total Fractions Prescribed: 10
Plan Total Prescribed Dose: 20 Gy
Reference Point Dosage Given to Date: 2 Gy
Reference Point Session Dosage Given: 2 Gy
Session Number: 1

## 2022-10-03 LAB — CBC
HCT: 30.8 % — ABNORMAL LOW (ref 36.0–46.0)
Hemoglobin: 9.7 g/dL — ABNORMAL LOW (ref 12.0–15.0)
MCH: 28 pg (ref 26.0–34.0)
MCHC: 31.5 g/dL (ref 30.0–36.0)
MCV: 88.8 fL (ref 80.0–100.0)
Platelets: 187 10*3/uL (ref 150–400)
RBC: 3.47 MIL/uL — ABNORMAL LOW (ref 3.87–5.11)
RDW: 15.6 % — ABNORMAL HIGH (ref 11.5–15.5)
WBC: 6 10*3/uL (ref 4.0–10.5)
nRBC: 0 % (ref 0.0–0.2)

## 2022-10-03 MED ORDER — HEPARIN SOD (PORK) LOCK FLUSH 100 UNIT/ML IV SOLN
500.0000 [IU] | INTRAVENOUS | Status: AC | PRN
  Administered 2022-10-03: 500 [IU]
  Filled 2022-10-03: qty 5

## 2022-10-03 MED ORDER — DARBEPOETIN ALFA 300 MCG/0.6ML IJ SOSY
300.0000 ug | PREFILLED_SYRINGE | Freq: Once | INTRAMUSCULAR | Status: DC
  Filled 2022-10-03: qty 0.6

## 2022-10-03 NOTE — Progress Notes (Signed)
_0  ID: Michelle [name], female    DOB: 1960/11/12, 61 y.o.   MRN: [id number]  Chief Complaint  Patient presents with   Follow-up    PHOS-occass.cough-clear, denies sob    Referring provider: Leeroy [name]  HPI: 61 year old female, former smoker. PMH significant for multiple myeloma and malignant pleural effusion.   Oncology notes 10/11/22- Dr. Marin [name] Impression and Plan: Michelle [name] is a very nice 61 year old African-American female.  She has IgG kappa myeloma.  The problem clearly is with the kappa light chains.  Will be incredibly interesting to see where the kappa light chains are now.   She had the talc pleurodesis.  She is getting radiation therapy to the skull base.  Hopefully, the radiation to the skull base will help with her double vision.   Our goal still is to try to get her to a stem cell transplant.  She clearly has high risk disease.  Hopefully, the change to Kyprolis from Velcade will help with her response.  Another possibility would be to add a  IMMD -Revlimid or Pomalyst.  However, she does have renal insufficiency.   We do not have to follow her incredibly closely.  I think she is to come back to see [place] in 1 week when she gets her next week of treatment.  We will have to be aggressive and treat her 3 weeks on and 1 week off.   Hopefully, we will be able to keep her out of the hospital.   I know she is trying hard.  She has incredible support from her family.  I really have to give a lot of people credit for how well she has done.     10/17/2022 Patient presents today for 2-week follow-up. Patient has multiple myeloma with pleural spread and recurrent symptomatic malignant pleural effusion, basilar portion drained.  Status post talc pleurodesis with minimal output and chest tube removed.  Plan is to follow-up outpatient with pulmonary to determine recurrence.  If pleural effusion does reaccumulate next step would be either reattempt pleurodesis or Pleurx  although not sure she has support structure placed for the latter.  She is doing alright today. She has new left arm pain, states that she was vomiting last Thursday and pulled a muscle. Pain worse today when changing her clothes. No traumatic fall. She has no respiratory symptoms. No shortness of breath. Rare cough with clear mucus. She is sick to her stomach most days. Chemo scheduled for tomorrow. Last radiation treatment is today. Taking 72m oxycodone 1-3 times a day.     Allergies  Allergen Reactions   Penicillins Other (See Comments)    Severe Yeast infection... No deathly reactions   Codeine Nausea Only   Morphine Other (See Comments)    headache   Tramadol Other (See Comments)    Headaches     Immunization History  Administered Date(s) Administered   Influenza Split 09/06/2013, 08/26/2014, 08/17/2019   Influenza,inj,Quad PF,6+ Mos 08/19/2016, 08/08/2021   Influenza,inj,quad, With Preservative 11/28/2015   Pneumococcal Conjugate-13 01/03/2015   Tdap 01/29/2016    Past Medical History:  Diagnosis Date   Anemia of chronic renal failure, stage 3b ([facility] 08/02/2022   Cancer ([facility] [place]    Essential hypertension 02/16/2017   Hyperlipemia    Hyperlipidemia 02/16/2017   Hypertension    Hypothyroidism 02/16/2017   Morbid obesity ([facility] 02/16/2017   Multiple myeloma without remission ([facility] 06/20/2022    Tobacco History: Social History   Tobacco Use  Smoking Status Former  Packs/day: 0.25   Years: 30.00   Total pack years: 7.50   Types: Cigarettes   Quit date: 48   Years since quitting: 33.9  Smokeless Tobacco Never   Counseling given: Not Answered   Outpatient Medications Prior to Visit  Medication Sig Dispense Refill   diazepam (VALIUM) 5 MG tablet Take thirty minutes before MRI for anxiety. (Patient taking differently: Take 5 mg by mouth daily as needed (Take thirty minutes before MRI for anxiety).) 1 tablet 0   famciclovir (FAMVIR) 500 MG tablet Take 1 tablet  (500 mg total) by mouth daily. 30 tablet 6   fluconazole (DIFLUCAN) 100 MG tablet Take 100 mg by mouth daily.     lactulose (CHRONULAC) 10 GM/15ML solution TAKE 15 MLS (10 G TOTAL) BY MOUTH 2 (TWO) TIMES DAILY. 1000 mL 4   levothyroxine (SYNTHROID) 88 MCG tablet Take 88 mcg by mouth daily before breakfast.     Multiple Vitamin (MULTIVITAMIN WITH MINERALS) TABS tablet Take 1 tablet by mouth daily.     OLANZapine (ZYPREXA) 5 MG tablet Take 1 tablet (5 mg total) by mouth at bedtime. 30 tablet 0   ondansetron (ZOFRAN) 8 MG tablet Take 1 tablet (8 mg total) by mouth every 8 (eight) hours as needed for nausea or vomiting. 20 tablet 2   Oxycodone HCl 10 MG TABS Take 1 tablet (10 mg total) by mouth every 4 (four) hours as needed. (Patient taking differently: Take 10 mg by mouth every 4 (four) hours as needed (for pain).) 120 tablet 0   pantoprazole (PROTONIX) 40 MG tablet Take 1 tablet (40 mg total) by mouth 2 (two) times daily. 60 tablet 6   prochlorperazine (COMPAZINE) 10 MG tablet Take 1 tablet (10 mg total) by mouth every 6 (six) hours as needed for nausea or vomiting. 30 tablet 5   rosuvastatin (CRESTOR) 10 MG tablet Take 10 mg by mouth every evening.     senna (SENOKOT) 8.6 MG TABS tablet Take 1 tablet (8.6 mg total) by mouth daily as needed for mild constipation or moderate constipation. 30 tablet 0   fluticasone (FLONASE) 50 MCG/ACT nasal spray Place 1 spray into both nostrils daily. (Patient not taking: Reported on 10/17/2022)     metoprolol tartrate (LOPRESSOR) 25 MG tablet Take 1 tablet (25 mg total) by mouth 2 (two) times daily. (Patient not taking: Reported on 10/17/2022) 60 tablet 1   No facility-administered medications prior to visit.    Review of Systems  Review of Systems  Constitutional:  Positive for fatigue.  HENT: Negative.    Respiratory:  Positive for cough. Negative for chest tightness, shortness of breath and wheezing.   Cardiovascular: Negative.   Musculoskeletal:         Left shoulder pain    Physical Exam  BP 132/70 (BP Location: Right Arm, Cuff Size: Normal)   Pulse (!) 138   Temp 98.2 F (36.8 C) (Temporal)   Ht 5' 4.25" (1.632 m)   Wt 165 lb (74.8 kg)   SpO2 100%   BMI 28.10 kg/m  Physical Exam Constitutional:      General: She is not in acute distress.    Appearance: Normal appearance.  HENT:     Head: Normocephalic and atraumatic.  Cardiovascular:     Rate and Rhythm: Regular rhythm. Tachycardia present.  Pulmonary:     Effort: Pulmonary effort is normal. No respiratory distress.     Breath sounds: Normal breath sounds.     Comments: Diminished right base; 02 100%  RA Musculoskeletal:        General: No swelling.     Comments: In WC, limited ROM left shoulder d/t pain  Skin:    General: Skin is warm and dry.  Neurological:     General: No focal deficit present.     Mental Status: She is alert and oriented to person, place, and time. Mental status is at baseline.  Psychiatric:        Mood and Affect: Mood normal.        Behavior: Behavior normal.        Thought Content: Thought content normal.        Judgment: Judgment normal.      Lab Results:  CBC    Component Value Date/Time   WBC 8.3 10/11/2022 0848   WBC 6.0 10/03/2022 0440   RBC 3.92 10/11/2022 0848   HGB 10.7 (L) 10/11/2022 0848   HCT 33.5 (L) 10/11/2022 0848   PLT 246 10/11/2022 0848   MCV 85.5 10/11/2022 0848   MCH 27.3 10/11/2022 0848   MCHC 31.9 10/11/2022 0848   RDW 14.8 10/11/2022 0848   LYMPHSABS 1.2 10/11/2022 0848   MONOABS 3.5 (H) 10/11/2022 0848   EOSABS 0.1 10/11/2022 0848   BASOSABS 0.0 10/11/2022 0848    BMET    Component Value Date/Time   NA 136 10/11/2022 0848   K 3.7 10/11/2022 0848   CL 96 (L) 10/11/2022 0848   CO2 28 10/11/2022 0848   GLUCOSE 129 (H) 10/11/2022 0848   BUN 15 10/11/2022 0848   CREATININE 2.22 (H) 10/11/2022 0848   CALCIUM 10.8 (H) 10/11/2022 0848   GFRNONAA 24 (L) 10/11/2022 0848   GFRAA 51 (L) 07/12/2015 1942     BNP No results found for: "BNP"  ProBNP No results found for: "PROBNP"  Imaging: DG Chest 2 View  Result Date: 10/17/2022 CLINICAL DATA:  Pain over left posterior chest wall. Multiple myeloma. EXAM: CHEST - 2 VIEW COMPARISON:  Chest 10/02/2022 FINDINGS: Heart size normal.  Negative for heart failure or edema. Interval removal of right basilar chest tube since the prior study. No pneumothorax. Right pleural effusion is small but has progressed from the prior study. Pleural based density right upper lobe appears slightly larger and may be pleural fluid or mass lesion. Progressive right lower lobe airspace disease. Myeloma lesions in the ribs on the right are better seen on prior CT. Port-A-Cath tip in the lower SVC unchanged. IMPRESSION: 1. Interval removal of right basilar chest tube. No pneumothorax. 2. Right pleural effusion has progressed. Pleural based density right upper lobe appears slightly larger. 3. Progressive right lower lobe airspace disease. Electronically Signed   By: Franchot Gallo M.D.   On: 10/17/2022 10:32   DG CHEST PORT 1 VIEW  Result Date: 10/02/2022 CLINICAL DATA:  [id number] with right pleural effusion and chest tube in place. EXAM: PORTABLE CHEST 1 VIEW COMPARISON:  Portable chest yesterday at 5:26 p.m. FINDINGS: 4:41 a.m. Left chest port with IJ approach catheter terminating in the right atrium. Pigtail pleural tube in the lower lateral right chest is again noted with decreased right pleural fluid in the interval, small layering right pleural effusion remains and a possible loculated small collection in the lateral apical right chest. There is elevated right hemidiaphragm and perihilar atelectatic bands. The right base is obscured by the elevated hemidiaphragm. Right basilar airspace disease could be hidden. The remaining visualized lungs are clear. The cardiac size is normal. There is aortic tortuosity and calcification, stable mediastinum.  No acute osseous findings.  IMPRESSION: 1. Decreased right pleural fluid in the interval with a small layering right pleural effusion remaining. Possible loculated collection in the lateral apical right chest. Right basilar airspace disease could be hidden due to low inspiration and elevated right diaphragm. 2. Elevated right hemidiaphragm with perihilar atelectatic bands. Electronically Signed   By: Telford Nab M.D.   On: 10/02/2022 06:51   DG CHEST PORT 1 VIEW  Result Date: 10/01/2022 CLINICAL DATA:  Chest 2 EXAM: PORTABLE CHEST 1 VIEW COMPARISON:  10/01/2022, CT 06/19/2022 FINDINGS: Left-sided central venous port tip over the right atrial region. Hypoventilatory changes. Interim placement of right-sided chest tube with pigtail at the right lower lateral chest. Residual right pleural effusion with possible loculation at the peripheral right upper lobe. No visible pneumothorax. Airspace disease at the right lung base. Probable background mild pulmonary edema. IMPRESSION: 1. Interim placement of right-sided chest tube with residual right pleural effusion and possible loculation at the peripheral right upper lobe. No visible pneumothorax. 2. Airspace disease at the right lung base persists. 3. Suspect mild interstitial edema Electronically Signed   By: Donavan Foil M.D.   On: 10/01/2022 18:18   DG Chest 2 View  Result Date: 10/01/2022 CLINICAL DATA:  Chest pain EXAM: CHEST - 2 VIEW COMPARISON:  09/17/2022 FINDINGS: Cardiac shadow is stable. Increasing right-sided pleural effusion is noted when compared with the prior exam. Left chest wall port is again seen. No acute bony abnormality is noted. IMPRESSION: Increasing right-sided pleural effusion. Electronically Signed   By: Inez Catalina M.D.   On: 10/01/2022 03:37   MR Brain W and Wo Contrast  Result Date: 09/19/2022 CLINICAL DATA:  Visual deficit and possible CN 6 palsy. Multiple myeloma EXAM: MRI HEAD AND ORBITS WITHOUT AND WITH CONTRAST TECHNIQUE: Multiplanar, multiecho  pulse sequences of the brain and surrounding structures were obtained without and with intravenous contrast. Multiplanar, multiecho pulse sequences of the orbits and surrounding structures were obtained including fat saturation techniques, before and after intravenous contrast administration. CONTRAST:  78m GADAVIST GADOBUTROL 1 MMOL/ML IV SOLN COMPARISON:  None Available. FINDINGS: MRI HEAD FINDINGS Brain: There is no acute infarct or acute hemorrhage. There is a homogeneously contrast-enhancing, left asymmetric prepontine mass that measures 4.3 x 2.4 x 4.6 cm. This lifts the pituitary gland. The mass involves the clivus. This location is likely to affect CN 6 transit through Dorello's canal. The mass may also extend into the hypoglossal canal and jugular foramen on the left. Vascular: Major flow voids are preserved. Skull and upper cervical spine: Aside from the above described lesion of the clivus, there are contrast-enhancing lesions of the right frontal, right parietal, left parietal and left temporal bones. There is dural thickening underlying the larger of these lesions at the right frontal, left temporal and inferior right parietal bones. MRI ORBITS FINDINGS Orbits: No traumatic or inflammatory finding. Globes, optic nerves, orbital fat, extraocular muscles, vascular structures, and lacrimal glands are normal. Visualized sinuses: Clear. Soft tissues: Negative. IMPRESSION: 1. Homogeneously contrast-enhancing, left asymmetric prepontine mass measuring 4.3 x 2.4 x 4.6 cm. The mass involves the clivus and is likely to affect CN 6 transit through Dorello's canal. The mass may also extend into the hypoglossal canal and jugular foramen on the left. In the context of multiple myeloma, this is most consistent with plasmacytoma. 2. Multiple other calvarial lesions, consistent with multiple myeloma. 3. Normal MRI of the orbits. Electronically Signed   By: KUlyses JarredM.D.   On: 09/19/2022  22:58   MR ORBITS W WO  CONTRAST  Result Date: 09/19/2022 CLINICAL DATA:  Visual deficit and possible CN 6 palsy. Multiple myeloma EXAM: MRI HEAD AND ORBITS WITHOUT AND WITH CONTRAST TECHNIQUE: Multiplanar, multiecho pulse sequences of the brain and surrounding structures were obtained without and with intravenous contrast. Multiplanar, multiecho pulse sequences of the orbits and surrounding structures were obtained including fat saturation techniques, before and after intravenous contrast administration. CONTRAST:  48m GADAVIST GADOBUTROL 1 MMOL/ML IV SOLN COMPARISON:  None Available. FINDINGS: MRI HEAD FINDINGS Brain: There is no acute infarct or acute hemorrhage. There is a homogeneously contrast-enhancing, left asymmetric prepontine mass that measures 4.3 x 2.4 x 4.6 cm. This lifts the pituitary gland. The mass involves the clivus. This location is likely to affect CN 6 transit through Dorello's canal. The mass may also extend into the hypoglossal canal and jugular foramen on the left. Vascular: Major flow voids are preserved. Skull and upper cervical spine: Aside from the above described lesion of the clivus, there are contrast-enhancing lesions of the right frontal, right parietal, left parietal and left temporal bones. There is dural thickening underlying the larger of these lesions at the right frontal, left temporal and inferior right parietal bones. MRI ORBITS FINDINGS Orbits: No traumatic or inflammatory finding. Globes, optic nerves, orbital fat, extraocular muscles, vascular structures, and lacrimal glands are normal. Visualized sinuses: Clear. Soft tissues: Negative. IMPRESSION: 1. Homogeneously contrast-enhancing, left asymmetric prepontine mass measuring 4.3 x 2.4 x 4.6 cm. The mass involves the clivus and is likely to affect CN 6 transit through Dorello's canal. The mass may also extend into the hypoglossal canal and jugular foramen on the left. In the context of multiple myeloma, this is most consistent with  plasmacytoma. 2. Multiple other calvarial lesions, consistent with multiple myeloma. 3. Normal MRI of the orbits. Electronically Signed   By: KUlyses JarredM.D.   On: 09/19/2022 22:58     Assessment & Plan:   Malignant pleural effusion - S/p talc pleurodesis of malignant pleural effusion. Respiratory wise she is doing well. She has no shortness of breath symptoms and O2 levels are 100% on room air. CXR today showed progression right pleural effusion, slight increase pleural density RUL. Discuss with Dr. IValeta Harmsand STamala Julian since patient is asymptomatic we will hold off on further procedure at this time. If she becomes symptomatic we can re-address thoracentesis vs pleurx catheter. Recommend repeating CXR in 2 weeks.    EMartyn Ehrich NP 10/17/2022

## 2022-10-03 NOTE — Plan of Care (Signed)

## 2022-10-03 NOTE — Telephone Encounter (Signed)
Ok, thanks Dr. Valeta Harms

## 2022-10-03 NOTE — Plan of Care (Signed)
Dc order recd. Will complete paperwork and review with pt and daughter.

## 2022-10-03 NOTE — Discharge Summary (Signed)
Physician Discharge Summary   Patient: Michelle [name] MRN: [id number] DOB: Jun 04, 1961  Admit date:     10/01/2022  Discharge date: 10/03/22  Discharge Physician: Manfred Shirts Sallye Lunz   PCP: Leeroy Cha, MD   Recommendations at discharge:    Follow up with Oncology   Discharge Diagnoses: Principal Problem:   Malignant pleural effusion Active Problems:   Hyperlipidemia   Essential hypertension   Hypothyroidism   Multiple myeloma without remission (HCC)   Stage 3a chronic kidney disease (CKD) (HCC)   GERD (gastroesophageal reflux disease)   Anxiety  Resolved Problems:   * No resolved hospital problems. *  Hospital Course: 61 y.o. female with medical history significant of multiple myeloma, HTN, GERD, HLD, anxiety, hypothyroidism, CKD3a. Presenting with shortness of breath and right axillary pain. She describes has constant tightness behind her ribs. Symptoms have been present for the last week. She has become increasingly short of breath. Oxycodone was helping initially, but not as much now. Her symptoms were similar to when she recently had a pleural effusion. So, when they did not improve this morning, she decided to come to the ED for evaluation    CXR showed increasing right-sided pleural effusion. Chest tube was placed yesterday and this morning 11/29 she had pleurodesis.   Her  chest tube has been taken out and she feels much better.  She had radiation done during her hospital stay 11/30  Oncology and Pulmonology has cleared patient for discharge today  Assessment and Plan: tatus post Chemical Pleurodesis via Chest Tube-11/29 Right sided pleural effusion Multiple myeloma -Known history of multiple myeloma who with right-sided pleural effusion.  Known history of malignant pleural effusion pleurodesis Followed by pulmonology. - Chest tube has been removed, appreciate Pulm assistance  Plan for outpatient pulmonology follow-up in place. Oncology has evaluated patient  and plan to adjust medications accordingly.  Follow up with    Anemia of chronic disease -Hb dropped to 9 from 11 at presentation. Oncology plans for ESA     CKD3a     - at baseline, watch nephrotoxins   HTN     - continue home regimen when confirmed   HLD     - continue home regimen when confirmed   Hypothyroidism     - continue home regimen when confirmed   GERD     - continue home regimen when confirmed   Anxiety     - continue home regimen when confirmed            Consultants: Onc and Pulm Procedures performed: CT placement Pleurodesis  Disposition: Home Diet recommendation:  Carb modified diet DISCHARGE MEDICATION: Allergies as of 10/03/2022       Reactions   Penicillins Other (See Comments)   Severe Yeast infection... No deathly reactions   Codeine Nausea Only   Morphine Other (See Comments)   headache   Tramadol Other (See Comments)   Headaches        Medication List     TAKE these medications    diazepam 5 MG tablet Commonly known as: VALIUM Take thirty minutes before MRI for anxiety. What changed:  how much to take how to take this when to take this reasons to take this additional instructions   fluconazole 100 MG tablet Commonly known as: DIFLUCAN Take 100 mg by mouth daily.   fluticasone 50 MCG/ACT nasal spray Commonly known as: FLONASE Place 1 spray into both nostrils daily.   lactulose 10 GM/15ML solution Commonly known as: [place]  15 MLS (10 G TOTAL) BY MOUTH 2 (TWO) TIMES DAILY.   levothyroxine 88 MCG tablet Commonly known as: SYNTHROID Take 88 mcg by mouth daily before breakfast.   lisinopril-hydrochlorothiazide 10-12.5 MG tablet Commonly known as: ZESTORETIC Take 1 tablet by mouth daily.   metoprolol tartrate 25 MG tablet Commonly known as: LOPRESSOR Take 1 tablet (25 mg total) by mouth 2 (two) times daily.   multivitamin with minerals Tabs tablet Take 1 tablet by mouth daily.   Oxycodone HCl 10  MG Tabs Take 1 tablet (10 mg total) by mouth every 4 (four) hours as needed. What changed: reasons to take this   pantoprazole 40 MG tablet Commonly known as: PROTONIX Take 1 tablet (40 mg total) by mouth 2 (two) times daily.   prochlorperazine 10 MG tablet Commonly known as: COMPAZINE Take 1 tablet (10 mg total) by mouth every 6 (six) hours as needed for nausea or vomiting.   rosuvastatin 10 MG tablet Commonly known as: CRESTOR Take 10 mg by mouth every evening.   senna 8.6 MG Tabs tablet Commonly known as: SENOKOT Take 1 tablet (8.6 mg total) by mouth daily as needed for mild constipation or moderate constipation.        Follow-up Information     [facility] Follow up in 2 week(s).   Specialty: Pulmonology Why: We will call you with appt date and time Contact information: [address] [place] [address] [phone]               Discharge Exam: Danley Danker Weights   10/01/22 0315 10/01/22 1131  Weight: 79.4 kg 75.6 kg   Gen Alertnot in acute distress Eyes: PERRL, normal sclera ENMT: Nares patent w/o discharge, orophaynx clear, dentition normal, ears w/o discharge/lesions/ulcers Neck: Supple, trachea midline Cardiovascular: tachy, +S1, S2, no m/g/r, equal pulses throughout Respiratory: Diminished breath sounds otherwise clear, no w/r/r, normal WOB on RA GI: BS+, NDNT, no masses noted, no organomegaly noted MSK: No e/c/c Skin: No rashes, bruises, ulcerations noted Neuro: A&O x 3, no focal deficits Psyc: Appropriate interaction and affect, calm/cooperative  Condition at discharge: good  The results of significant diagnostics from this hospitalization (including imaging, microbiology, ancillary and laboratory) are listed below for reference.   Imaging Studies: DG CHEST PORT 1 VIEW  Result Date: 10/02/2022 CLINICAL DATA:  [id number] with right pleural effusion and chest tube in place. EXAM: PORTABLE CHEST 1 VIEW  COMPARISON:  Portable chest yesterday at 5:26 p.m. FINDINGS: 4:41 a.m. Left chest port with IJ approach catheter terminating in the right atrium. Pigtail pleural tube in the lower lateral right chest is again noted with decreased right pleural fluid in the interval, small layering right pleural effusion remains and a possible loculated small collection in the lateral apical right chest. There is elevated right hemidiaphragm and perihilar atelectatic bands. The right base is obscured by the elevated hemidiaphragm. Right basilar airspace disease could be hidden. The remaining visualized lungs are clear. The cardiac size is normal. There is aortic tortuosity and calcification, stable mediastinum. No acute osseous findings. IMPRESSION: 1. Decreased right pleural fluid in the interval with a small layering right pleural effusion remaining. Possible loculated collection in the lateral apical right chest. Right basilar airspace disease could be hidden due to low inspiration and elevated right diaphragm. 2. Elevated right hemidiaphragm with perihilar atelectatic bands. Electronically Signed   By: Telford Nab M.D.   On: 10/02/2022 06:51   DG CHEST PORT 1 VIEW  Result Date:  10/01/2022 CLINICAL DATA:  Chest 2 EXAM: PORTABLE CHEST 1 VIEW COMPARISON:  10/01/2022, CT 06/19/2022 FINDINGS: Left-sided central venous port tip over the right atrial region. Hypoventilatory changes. Interim placement of right-sided chest tube with pigtail at the right lower lateral chest. Residual right pleural effusion with possible loculation at the peripheral right upper lobe. No visible pneumothorax. Airspace disease at the right lung base. Probable background mild pulmonary edema. IMPRESSION: 1. Interim placement of right-sided chest tube with residual right pleural effusion and possible loculation at the peripheral right upper lobe. No visible pneumothorax. 2. Airspace disease at the right lung base persists. 3. Suspect mild interstitial  edema Electronically Signed   By: Donavan Foil M.D.   On: 10/01/2022 18:18   DG Chest 2 View  Result Date: 10/01/2022 CLINICAL DATA:  Chest pain EXAM: CHEST - 2 VIEW COMPARISON:  09/17/2022 FINDINGS: Cardiac shadow is stable. Increasing right-sided pleural effusion is noted when compared with the prior exam. Left chest wall port is again seen. No acute bony abnormality is noted. IMPRESSION: Increasing right-sided pleural effusion. Electronically Signed   By: Inez Catalina M.D.   On: 10/01/2022 03:37   MR Brain W and Wo Contrast  Result Date: 09/19/2022 CLINICAL DATA:  Visual deficit and possible CN 6 palsy. Multiple myeloma EXAM: MRI HEAD AND ORBITS WITHOUT AND WITH CONTRAST TECHNIQUE: Multiplanar, multiecho pulse sequences of the brain and surrounding structures were obtained without and with intravenous contrast. Multiplanar, multiecho pulse sequences of the orbits and surrounding structures were obtained including fat saturation techniques, before and after intravenous contrast administration. CONTRAST:  53m GADAVIST GADOBUTROL 1 MMOL/ML IV SOLN COMPARISON:  None Available. FINDINGS: MRI HEAD FINDINGS Brain: There is no acute infarct or acute hemorrhage. There is a homogeneously contrast-enhancing, left asymmetric prepontine mass that measures 4.3 x 2.4 x 4.6 cm. This lifts the pituitary gland. The mass involves the clivus. This location is likely to affect CN 6 transit through Dorello's canal. The mass may also extend into the hypoglossal canal and jugular foramen on the left. Vascular: Major flow voids are preserved. Skull and upper cervical spine: Aside from the above described lesion of the clivus, there are contrast-enhancing lesions of the right frontal, right parietal, left parietal and left temporal bones. There is dural thickening underlying the larger of these lesions at the right frontal, left temporal and inferior right parietal bones. MRI ORBITS FINDINGS Orbits: No traumatic or inflammatory  finding. Globes, optic nerves, orbital fat, extraocular muscles, vascular structures, and lacrimal glands are normal. Visualized sinuses: Clear. Soft tissues: Negative. IMPRESSION: 1. Homogeneously contrast-enhancing, left asymmetric prepontine mass measuring 4.3 x 2.4 x 4.6 cm. The mass involves the clivus and is likely to affect CN 6 transit through Dorello's canal. The mass may also extend into the hypoglossal canal and jugular foramen on the left. In the context of multiple myeloma, this is most consistent with plasmacytoma. 2. Multiple other calvarial lesions, consistent with multiple myeloma. 3. Normal MRI of the orbits. Electronically Signed   By: KUlyses JarredM.D.   On: 09/19/2022 22:58   MR ORBITS W WO CONTRAST  Result Date: 09/19/2022 CLINICAL DATA:  Visual deficit and possible CN 6 palsy. Multiple myeloma EXAM: MRI HEAD AND ORBITS WITHOUT AND WITH CONTRAST TECHNIQUE: Multiplanar, multiecho pulse sequences of the brain and surrounding structures were obtained without and with intravenous contrast. Multiplanar, multiecho pulse sequences of the orbits and surrounding structures were obtained including fat saturation techniques, before and after intravenous contrast administration. CONTRAST:  868mGADAVIST  GADOBUTROL 1 MMOL/ML IV SOLN COMPARISON:  None Available. FINDINGS: MRI HEAD FINDINGS Brain: There is no acute infarct or acute hemorrhage. There is a homogeneously contrast-enhancing, left asymmetric prepontine mass that measures 4.3 x 2.4 x 4.6 cm. This lifts the pituitary gland. The mass involves the clivus. This location is likely to affect CN 6 transit through Dorello's canal. The mass may also extend into the hypoglossal canal and jugular foramen on the left. Vascular: Major flow voids are preserved. Skull and upper cervical spine: Aside from the above described lesion of the clivus, there are contrast-enhancing lesions of the right frontal, right parietal, left parietal and left temporal bones.  There is dural thickening underlying the larger of these lesions at the right frontal, left temporal and inferior right parietal bones. MRI ORBITS FINDINGS Orbits: No traumatic or inflammatory finding. Globes, optic nerves, orbital fat, extraocular muscles, vascular structures, and lacrimal glands are normal. Visualized sinuses: Clear. Soft tissues: Negative. IMPRESSION: 1. Homogeneously contrast-enhancing, left asymmetric prepontine mass measuring 4.3 x 2.4 x 4.6 cm. The mass involves the clivus and is likely to affect CN 6 transit through Dorello's canal. The mass may also extend into the hypoglossal canal and jugular foramen on the left. In the context of multiple myeloma, this is most consistent with plasmacytoma. 2. Multiple other calvarial lesions, consistent with multiple myeloma. 3. Normal MRI of the orbits. Electronically Signed   By: Ulyses Jarred M.D.   On: 09/19/2022 22:58   DG Chest 1 View  Result Date: 09/17/2022 CLINICAL DATA:  Status post thoracentesis EXAM: CHEST  1 VIEW COMPARISON:  June 22, 2022 chest x-ray FINDINGS: A left Port-A-Cath is in good position. The cardiomediastinal silhouette is stable. The patient is status post right thoracentesis without pneumothorax. The right effusion is smaller in the interval. The opacity in the medial right lung base may represent atelectasis from the recent effusion. No other significant abnormalities. IMPRESSION: 1. No pneumothorax after right thoracentesis. The right effusion is smaller in the interval. 2. Opacity in the medial right lung base may represent atelectasis from the recent effusion. Electronically Signed   By: Dorise Bullion III M.D.   On: 09/17/2022 19:57   US Thoracentesis Asp Pleural space w/IMG guide  Result Date: 09/17/2022 INDICATION: History of multiple myeloma, recurrent pleural effusion EXAM: ULTRASOUND GUIDED RIGHT THORACENTESIS MEDICATIONS: None. COMPLICATIONS: None immediate. PROCEDURE: An ultrasound guided thoracentesis  was thoroughly discussed with the patient and questions answered. The benefits, risks, alternatives and complications were also discussed. The patient understands and wishes to proceed with the procedure. Written consent was obtained. Ultrasound was performed to localize and mark an adequate pocket of fluid in the right chest. The area was then prepped and draped in the normal sterile fashion. 1% Lidocaine was used for local anesthesia. Under ultrasound guidance a 6 Fr Safe-T-Centesis catheter was introduced. Thoracentesis was performed. The catheter was removed and a dressing applied. FINDINGS: A total of approximately 1L of pleural fluid was removed. Samples were sent to the laboratory as requested by the clinical team. IMPRESSION: Successful ultrasound guided right thoracentesis yielding 1L of pleural fluid. Performed and dictated by Pasty Spillers, PA-C Electronically Signed   By: Corrie Mckusick D.O.   On: 09/17/2022 12:39   MR CERVICAL SPINE W WO CONTRAST  Result Date: 09/12/2022 CLINICAL DATA:  Pain in the lower cervical spine and left triceps area; multiple myeloma EXAM: MRI CERVICAL SPINE WITHOUT AND WITH CONTRAST TECHNIQUE: Multiplanar and multiecho pulse sequences of the cervical spine, to include the  craniocervical junction and cervicothoracic junction, were obtained without and with intravenous contrast. CONTRAST:  89m GADAVIST GADOBUTROL 1 MMOL/ML IV SOLN COMPARISON:  No prior MRI of the cervical spine. FINDINGS: Alignment: Preservation of the normal cervical lordosis. No listhesis. Vertebrae: Abnormal signal at every cervical level, as well as in the imaged thoracic spine with decreased T1 signal, increased T2 signal, and contrast enhancement. Relative sparing of C7. No evidence of pathologic fracture. Abnormal signal is also noted in the clivus (series 14, image 7), right occipital condyle (series 14, image 12)., bilateral first ribs, and right third rib. There is significant abnormal enhancement  along the right aspect of T3 and T4 (series 14, image 4), which is incompletely evaluated on this exam. Cord: Normal signal and morphology.  No abnormal enhancement. Posterior Fossa, vertebral arteries, paraspinal tissues: Large right pleural effusion. Disc levels: C2-C3: No significant disc bulge. No spinal canal stenosis or neuroforaminal narrowing. C3-C4: Small central disc protrusion. No spinal canal stenosis or neural foraminal narrowing. C4-C5: Minimal disc bulge. Mild facet arthropathy. Ligamentum flavum hypertrophy. Mild-to-moderate spinal canal stenosis. Mild right neural foraminal narrowing. C5-C6: Mild disc bulge. Facet and uncovertebral hypertrophy. Mild spinal canal stenosis. Moderate right neural foraminal narrowing. C6-C7: Minimal disc bulge. No spinal canal stenosis or neural foraminal narrowing. C7-T1: No significant disc bulge. No spinal canal stenosis or neuroforaminal narrowing. IMPRESSION: 1. Abnormal signal at every cervical level, concerning for metastatic disease. No evidence of pathologic fracture. 2. Abnormal signal in the clivus and right occipital condyle, concerning for additional calvarial metastatic disease. Recommend MRI brain with and without contrast for further evaluation if clinically indicated. 3. Abnormal signal in the imaged thoracic spine, bilateral first ribs, and right third rib, with abnormal masslike enhancement along the right aspect of T3 and T4, which is incompletely evaluated on this exam. Recommend MRI thoracic spine with and without contrast if clinically indicated. 4. C4-C5 mild-to-moderate spinal canal stenosis and mild right neural foraminal narrowing. 5. C5-C6 mild spinal canal stenosis and moderate right neural foraminal narrowing. 6. Large right pleural effusion. These results will be called to the ordering clinician or representative by the Radiologist Assistant, and communication documented in the PACS or [employer] Electronically Signed   By: AMerilyn BabaM.D.   On: 09/12/2022 21:19    Microbiology: Results for orders placed or performed during the hospital encounter of 10/01/22  MRSA Next Gen by PCR, Nasal     Status: None   Collection Time: 10/01/22 11:41 AM   Specimen: Nasal Mucosa; Nasal Swab  Result Value Ref Range Status   MRSA by PCR Next Gen NOT DETECTED NOT DETECTED Final    Comment: (NOTE) The GeneXpert MRSA Assay (FDA approved for NASAL specimens only), is one component of a comprehensive MRSA colonization surveillance program. It is not intended to diagnose MRSA infection nor to guide or monitor treatment for MRSA infections. Test performance is not FDA approved in patients less than 241years old. Performed at [facility] [id number], [place] [address]  Urine Culture     Status: Abnormal   Collection Time: 10/01/22 11:57 AM   Specimen: Urine, Clean Catch  Result Value Ref Range Status   Specimen Description   Final    URINE, CLEAN CATCH Performed at [facility] [address], [place] [address]   Special Requests   Final    NONE [facility]formed at [facility] [address], [place] [address]  Culture MULTIPLE SPECIES PRESENT, SUGGEST RECOLLECTION (A)  Final   Report Status 10/02/2022 FINAL  Final    Labs: CBC: Recent Labs  Lab 10/01/22 0520 10/02/22 0413 10/03/22 0440  WBC 6.7 6.0 6.0  HGB 11.0* 9.6* 9.7*  HCT 35.2* 30.6* 30.8*  MCV 89.6 88.7 88.8  PLT 187 154 621   Basic Metabolic Panel: Recent Labs  Lab 10/01/22 0520 10/02/22 0413 10/03/22 0440  NA 138 138 138  K 3.8 3.5 3.6  CL 103 104 104  CO2 _0 GLUCOSE 112* 98 102*  BUN _1 CREATININE 1.25* 1.34* 1.53*  CALCIUM 9.7 9.4 9.3   Liver Function Tests: Recent Labs  Lab 10/02/22 0413 10/03/22 0440  AST 28 25  ALT 12 11  ALKPHOS 82 79  BILITOT 0.8 0.5  PROT 5.9* 5.7*  ALBUMIN 3.1* 3.0*   CBG: No results for input(s): "GLUCAP"  in the last 168 hours.  Discharge time spent: greater than 30 minutes.  Signed: Cristela Felt, MD [facility] 10/03/2022

## 2022-10-03 NOTE — Progress Notes (Signed)
Michelle [name] is doing better today.  The chest tube has been taken out of her right side.  She had the talc pleurodesis.  I really, really, really appreciate Pulmonary Medicine helping [place] out with this.  She has had no shortness of breath.  She was seen by Radiation Oncology.  They will do some radiation therapy to her skull base.  She has some diplopia with the left eye.  Hopefully, this will help with the double vision.  She might be discharged today.  I do not see any reason why she cannot be discharged she is she is doing so well.  She has had no problems with nausea or vomiting.  She has had little bit of constipation.  Her labs look good today.  Her white cell count is 6.  Hemoglobin 9.7.  Platelet count 187,000.  Her BUN is 11 creatinine 1.53.  Calcium is 9.3 with an albumin of 3.  I think that we will give her a dose of Aranesp for the anemia.  We saw her in the office previously, and erythropoietin level was only 12.9.  As such, I think she would respond to the [facility].  She has had no fever.  She has had no leg swelling.  She has had no rashes.  Overall, I would have said that her performance status right now is ECOG 1.  Her vital signs show temperature of 98.1.  Her pulse is quite high at 124.  Her blood pressure is 122/76.  Her head and neck exam shows no ocular or oral lesions.  Again she has some decreased range of motion of the left eye.  There is no adenopathy in the neck.  Her lungs actually sound good bilaterally.  There may be some slight decrease in the right lung.  Cardiac exam is tachycardic but regular.  She has no murmurs.  Abdomen is soft.  She has good bowel sounds.  There is no fluid wave.  There is no palpable liver or spleen tip.  Extremity shows no clubbing, cyanosis or edema.  Skin exam shows no rashes.  Neurological exam shows what looks to be like a left cranial nerve VI deficit.  Again, she has a malignant pleural effusion.  Again this is incredibly rare for myeloma  to do this.  We may have to make a change with her protocol.  I may have to add Revlimid.  I may have to also take out the Velcade and add Kyprolis.  Her last myeloma studies were improved although the light chain was on the higher side.  It is certainly possible that she may be developing a resistant line of myeloma cells.  Again she needs to be discharged today, I do not see any problem from my point of view.  She does have the tachycardic which might be a problem.  I know that this staff on [address] have done a great job with her.   [name], MD  Acts 16:31

## 2022-10-03 NOTE — Progress Notes (Signed)
Mobility Specialist - Progress Note   10/03/22 0922  Mobility  Activity Ambulated with assistance in hallway  Level of Assistance Modified independent, requires aide device or extra time  Assistive Device Front wheel walker  Distance Ambulated (ft) 175 ft  Activity Response Tolerated well  Mobility Referral Yes  $Mobility charge 1 Mobility   Pt received in bed and agreed to mobility, no c/o pain. Some discomfort with left eye. Pt cannot see out of it, causes some LOB upon standing as well as some drifting to the left when walking, self corrected. Pt returned to bed with all needs met.   Roderick Pee Mobility Specialist

## 2022-10-04 ENCOUNTER — Ambulatory Visit: Payer: BLUE CROSS/BLUE SHIELD | Admitting: Hematology & Oncology

## 2022-10-04 ENCOUNTER — Other Ambulatory Visit: Payer: BLUE CROSS/BLUE SHIELD

## 2022-10-04 ENCOUNTER — Other Ambulatory Visit: Payer: Self-pay

## 2022-10-04 ENCOUNTER — Other Ambulatory Visit: Payer: Self-pay | Admitting: *Deleted

## 2022-10-04 ENCOUNTER — Other Ambulatory Visit: Payer: Self-pay | Admitting: Hematology & Oncology

## 2022-10-04 ENCOUNTER — Ambulatory Visit: Payer: BLUE CROSS/BLUE SHIELD

## 2022-10-04 ENCOUNTER — Encounter: Payer: Self-pay | Admitting: *Deleted

## 2022-10-04 ENCOUNTER — Ambulatory Visit
Admission: RE | Admit: 2022-10-04 | Discharge: 2022-10-04 | Disposition: A | Discharge status: Home / Self Care | Payer: BC Managed Care – PPO | Source: Ambulatory Visit | Attending: Radiation Oncology | Admitting: Radiation Oncology

## 2022-10-04 DIAGNOSIS — C9000 Multiple myeloma not having achieved remission: Secondary | ICD-10-CM | POA: Diagnosis present

## 2022-10-04 LAB — RAD ONC ARIA SESSION SUMMARY
Course Elapsed Days: 1
Plan Fractions Treated to Date: 2
Plan Prescribed Dose Per Fraction: 2 Gy
Plan Total Fractions Prescribed: 10
Plan Total Prescribed Dose: 20 Gy
Reference Point Dosage Given to Date: 4 Gy
Reference Point Session Dosage Given: 2 Gy
Session Number: 2

## 2022-10-04 MED ORDER — PANTOPRAZOLE SODIUM 40 MG PO TBEC: 40.0000 mg | DELAYED_RELEASE_TABLET | Freq: Two times a day (BID) | ORAL | 6 refills | Status: AC

## 2022-10-04 NOTE — Progress Notes (Signed)
Patient recently hospitalized and discharged home yesterday. She will need to be seen next Friday to initiate treatment change.   Oncology Nurse Navigator Documentation     10/04/2022    8:45 AM  Oncology Nurse Navigator Flowsheets  Navigator Follow Up Date: 10/11/2022  Navigator Follow Up Reason: Follow-up Appointment;Chemotherapy  Navigator Location [facility]  Navigator Encounter Type Appt/Treatment Plan Review  Patient Visit Type MedOnc  Treatment Phase Active Tx  Barriers/Navigation Needs Coordination of Care;Education  Interventions Coordination of Care  Acuity Level 2-Minimal Needs (1-2 Barriers Identified)  Coordination of Care Appts  Support Groups/Services Friends and Family  Time Spent with Patient 15

## 2022-10-07 ENCOUNTER — Ambulatory Visit
Admission: RE | Admit: 2022-10-07 | Discharge: 2022-10-07 | Disposition: A | Discharge status: Home / Self Care | Payer: BC Managed Care – PPO | Source: Ambulatory Visit | Attending: Radiation Oncology | Admitting: Radiation Oncology

## 2022-10-07 ENCOUNTER — Other Ambulatory Visit: Payer: Self-pay

## 2022-10-07 DIAGNOSIS — C9000 Multiple myeloma not having achieved remission: Secondary | ICD-10-CM | POA: Diagnosis not present

## 2022-10-07 LAB — RAD ONC ARIA SESSION SUMMARY
Course Elapsed Days: 4
Plan Fractions Treated to Date: 3
Plan Prescribed Dose Per Fraction: 2 Gy
Plan Total Fractions Prescribed: 10
Plan Total Prescribed Dose: 20 Gy
Reference Point Dosage Given to Date: 6 Gy
Reference Point Session Dosage Given: 2 Gy
Session Number: 3

## 2022-10-08 ENCOUNTER — Ambulatory Visit
Admission: RE | Admit: 2022-10-08 | Discharge: 2022-10-08 | Disposition: A | Discharge status: Home / Self Care | Payer: BC Managed Care – PPO | Source: Ambulatory Visit | Attending: Radiation Oncology | Admitting: Radiation Oncology

## 2022-10-08 ENCOUNTER — Other Ambulatory Visit: Payer: Self-pay

## 2022-10-08 DIAGNOSIS — C9000 Multiple myeloma not having achieved remission: Secondary | ICD-10-CM | POA: Diagnosis not present

## 2022-10-08 LAB — RAD ONC ARIA SESSION SUMMARY
Course Elapsed Days: 5
Plan Fractions Treated to Date: 4
Plan Prescribed Dose Per Fraction: 2 Gy
Plan Total Fractions Prescribed: 10
Plan Total Prescribed Dose: 20 Gy
Reference Point Dosage Given to Date: 8 Gy
Reference Point Session Dosage Given: 2 Gy
Session Number: 4

## 2022-10-09 ENCOUNTER — Other Ambulatory Visit: Payer: Self-pay

## 2022-10-09 ENCOUNTER — Ambulatory Visit
Admission: RE | Admit: 2022-10-09 | Discharge: 2022-10-09 | Disposition: A | Discharge status: Home / Self Care | Payer: BC Managed Care – PPO | Source: Ambulatory Visit | Attending: Radiation Oncology | Admitting: Radiation Oncology

## 2022-10-09 DIAGNOSIS — C9000 Multiple myeloma not having achieved remission: Secondary | ICD-10-CM | POA: Diagnosis not present

## 2022-10-09 LAB — RAD ONC ARIA SESSION SUMMARY
Course Elapsed Days: 6
Plan Fractions Treated to Date: 5
Plan Prescribed Dose Per Fraction: 2 Gy
Plan Total Fractions Prescribed: 10
Plan Total Prescribed Dose: 20 Gy
Reference Point Dosage Given to Date: 10 Gy
Reference Point Session Dosage Given: 2 Gy
Session Number: 5

## 2022-10-10 ENCOUNTER — Telehealth: Payer: Self-pay | Admitting: *Deleted

## 2022-10-10 ENCOUNTER — Ambulatory Visit: Payer: BC Managed Care – PPO

## 2022-10-10 ENCOUNTER — Telehealth: Payer: Self-pay

## 2022-10-10 ENCOUNTER — Other Ambulatory Visit: Payer: Self-pay | Admitting: *Deleted

## 2022-10-10 MED ORDER — ONDANSETRON HCL 8 MG PO TABS: 8.0000 mg | ORAL_TABLET | Freq: Three times a day (TID) | ORAL | 2 refills | Status: AC | PRN

## 2022-10-10 NOTE — Telephone Encounter (Signed)
Peer to peer not able to be done for Kyprolis due to it expired yesterday.  Appeal process started.  Additional information faxed to [place] at [phone].

## 2022-10-10 NOTE — Telephone Encounter (Signed)
Michelle Paradise RN called Michelle [name] (with Dr. Marin Olp) to let her know that pt is having issues with nausea and vomiting, but not taking her compazine. Rn Michelle [name] did encourage pt to take her compazine today and rest due to this n/v (radiation treatment for today will be rescheduled). She states the compazine makes her feel funny and "out of it". She states it makes her too sleepy. Rn Michelle [name] wanted to let the medical oncology know of this concern prior to pt appointment tomorrow with Dr. Marin Olp.

## 2022-10-10 NOTE — Telephone Encounter (Signed)
Call placed to patient to inform her that Dr. Marin Olp has sent in another prescription for her to take if needed for nausea since the Compazine is not helping with her nausea per radiation therapies call.  Pt instructed that a prescription for Zofran 8 mg PO every 8 hrs PRN has been called in to her pharmacy per Dr. Marin Olp.  Pt is appreciative of assistance and states that she will pick up prescription today.

## 2022-10-10 NOTE — Telephone Encounter (Signed)
Ms. Dosh called and left message for RN Anderson Malta. Rn Anderson Malta called pt back and she stated she was sick with n/v since yesterday. Rn asked her if she had been taking her compazine and she stated she did not because it makes her too sleepy. She also stated she felt like she could not make her appointment today due to this n/v. Rn called [facility] to let them know and this treatment will be added to the end of her calendar. Rn also encouraged pt to take her compazine and get some rest at home in preparation to come tomorrow. Rn will reach out to medical oncology and let them know about her concern with her anti emetic medication. Pt to see Dr. Marin Olp tomorrow. Rn encouraged her to call with any further needs.

## 2022-10-11 ENCOUNTER — Ambulatory Visit
Admission: RE | Admit: 2022-10-11 | Discharge: 2022-10-11 | Disposition: A | Discharge status: Home / Self Care | Payer: BC Managed Care – PPO | Source: Ambulatory Visit | Attending: Radiation Oncology | Admitting: Radiation Oncology

## 2022-10-11 ENCOUNTER — Other Ambulatory Visit: Payer: Self-pay

## 2022-10-11 ENCOUNTER — Inpatient Hospital Stay: Payer: BC Managed Care – PPO | Attending: Hematology & Oncology

## 2022-10-11 ENCOUNTER — Inpatient Hospital Stay: Payer: BC Managed Care – PPO

## 2022-10-11 ENCOUNTER — Encounter: Payer: Self-pay | Admitting: *Deleted

## 2022-10-11 ENCOUNTER — Inpatient Hospital Stay (HOSPITAL_BASED_OUTPATIENT_CLINIC_OR_DEPARTMENT_OTHER): Payer: BC Managed Care – PPO | Admitting: Hematology & Oncology

## 2022-10-11 ENCOUNTER — Encounter: Payer: Self-pay | Admitting: Hematology & Oncology

## 2022-10-11 VITALS — BP 106/61 | HR 101 | Temp 98.0°F | Resp 18 | Ht 64.0 in | Wt 162.0 lb

## 2022-10-11 VITALS — BP 95/60 | HR 108 | Resp 18

## 2022-10-11 DIAGNOSIS — K5900 Constipation, unspecified: Secondary | ICD-10-CM | POA: Insufficient documentation

## 2022-10-11 DIAGNOSIS — C9000 Multiple myeloma not having achieved remission: Secondary | ICD-10-CM | POA: Insufficient documentation

## 2022-10-11 DIAGNOSIS — Z5111 Encounter for antineoplastic chemotherapy: Secondary | ICD-10-CM | POA: Diagnosis not present

## 2022-10-11 DIAGNOSIS — D631 Anemia in chronic kidney disease: Secondary | ICD-10-CM | POA: Diagnosis not present

## 2022-10-11 DIAGNOSIS — J910 Malignant pleural effusion: Secondary | ICD-10-CM | POA: Diagnosis not present

## 2022-10-11 DIAGNOSIS — N1832 Chronic kidney disease, stage 3b: Secondary | ICD-10-CM | POA: Insufficient documentation

## 2022-10-11 DIAGNOSIS — H532 Diplopia: Secondary | ICD-10-CM | POA: Insufficient documentation

## 2022-10-11 DIAGNOSIS — Z5112 Encounter for antineoplastic immunotherapy: Secondary | ICD-10-CM | POA: Insufficient documentation

## 2022-10-11 LAB — CBC WITH DIFFERENTIAL (CANCER CENTER ONLY)
Abs Immature Granulocytes: 0.03 10*3/uL (ref 0.00–0.07)
Basophils Absolute: 0 10*3/uL (ref 0.0–0.1)
Basophils Relative: 0 %
Eosinophils Absolute: 0.1 10*3/uL (ref 0.0–0.5)
Eosinophils Relative: 1 %
HCT: 33.5 % — ABNORMAL LOW (ref 36.0–46.0)
Hemoglobin: 10.7 g/dL — ABNORMAL LOW (ref 12.0–15.0)
Immature Granulocytes: 0 %
Lymphocytes Relative: 14 %
Lymphs Abs: 1.2 10*3/uL (ref 0.7–4.0)
MCH: 27.3 pg (ref 26.0–34.0)
MCHC: 31.9 g/dL (ref 30.0–36.0)
MCV: 85.5 fL (ref 80.0–100.0)
Monocytes Absolute: 3.5 10*3/uL — ABNORMAL HIGH (ref 0.1–1.0)
Monocytes Relative: 43 %
Neutro Abs: 3.5 10*3/uL (ref 1.7–7.7)
Neutrophils Relative %: 42 %
Platelet Count: 246 10*3/uL (ref 150–400)
RBC: 3.92 MIL/uL (ref 3.87–5.11)
RDW: 14.8 % (ref 11.5–15.5)
Smear Review: NORMAL
WBC Count: 8.3 10*3/uL (ref 4.0–10.5)
nRBC: 0 % (ref 0.0–0.2)

## 2022-10-11 LAB — RAD ONC ARIA SESSION SUMMARY
Course Elapsed Days: 8
Plan Fractions Treated to Date: 6
Plan Prescribed Dose Per Fraction: 2 Gy
Plan Total Fractions Prescribed: 10
Plan Total Prescribed Dose: 20 Gy
Reference Point Dosage Given to Date: 12 Gy
Reference Point Session Dosage Given: 2 Gy
Session Number: 6

## 2022-10-11 LAB — CMP (CANCER CENTER ONLY)
ALT: 10 U/L (ref 0–44)
AST: 27 U/L (ref 15–41)
Albumin: 4 g/dL (ref 3.5–5.0)
Alkaline Phosphatase: 96 U/L (ref 38–126)
Anion gap: 12 (ref 5–15)
BUN: 15 mg/dL (ref 8–23)
CO2: 28 mmol/L (ref 22–32)
Calcium: 10.8 mg/dL — ABNORMAL HIGH (ref 8.9–10.3)
Chloride: 96 mmol/L — ABNORMAL LOW (ref 98–111)
Creatinine: 2.22 mg/dL — ABNORMAL HIGH (ref 0.44–1.00)
GFR, Estimated: 24 mL/min — ABNORMAL LOW (ref >60)
Glucose, Bld: 129 mg/dL — ABNORMAL HIGH (ref 70–99)
Potassium: 3.7 mmol/L (ref 3.5–5.1)
Sodium: 136 mmol/L (ref 135–145)
Total Bilirubin: 0.5 mg/dL (ref 0.3–1.2)
Total Protein: 7 g/dL (ref 6.5–8.1)

## 2022-10-11 LAB — LACTATE DEHYDROGENASE: LDH: 355 U/L — ABNORMAL HIGH (ref 98–192)

## 2022-10-11 MED ORDER — HEPARIN SOD (PORK) LOCK FLUSH 100 UNIT/ML IV SOLN
500.0000 [IU] | Freq: Once | INTRAVENOUS | Status: AC | PRN
  Administered 2022-10-11: 500 [IU]

## 2022-10-11 MED ORDER — DIPHENHYDRAMINE HCL 25 MG PO CAPS
50.0000 mg | ORAL_CAPSULE | Freq: Once | ORAL | Status: AC
  Administered 2022-10-11: 50 mg via ORAL
  Filled 2022-10-11: qty 2

## 2022-10-11 MED ORDER — ACETAMINOPHEN 325 MG PO TABS
650.0000 mg | ORAL_TABLET | Freq: Once | ORAL | Status: AC
  Administered 2022-10-11: 650 mg via ORAL
  Filled 2022-10-11: qty 2

## 2022-10-11 MED ORDER — SODIUM CHLORIDE 0.9 % IV SOLN: Freq: Once | INTRAVENOUS | Status: AC

## 2022-10-11 MED ORDER — SODIUM CHLORIDE 0.9 % IV SOLN
20.0000 mg | Freq: Once | INTRAVENOUS | Status: AC
  Administered 2022-10-11: 20 mg via INTRAVENOUS
  Filled 2022-10-11: qty 20

## 2022-10-11 MED ORDER — SODIUM CHLORIDE 0.9 % IV SOLN
400.0000 mg | Freq: Once | INTRAVENOUS | Status: AC
  Administered 2022-10-11: 400 mg via INTRAVENOUS
  Filled 2022-10-11: qty 10

## 2022-10-11 MED ORDER — SODIUM CHLORIDE 0.9% FLUSH
10.0000 mL | INTRAVENOUS | Status: DC | PRN
  Administered 2022-10-11: 10 mL

## 2022-10-11 MED ORDER — DARATUMUMAB-HYALURONIDASE-FIHJ 1800-30000 MG-UT/15ML ~~LOC~~ SOLN
1800.0000 mg | Freq: Once | SUBCUTANEOUS | Status: AC
  Administered 2022-10-11: 1800 mg via SUBCUTANEOUS
  Filled 2022-10-11: qty 15

## 2022-10-11 MED ORDER — DEXTROSE 5 % IV SOLN
20.0000 mg/m2 | Freq: Once | INTRAVENOUS | Status: AC
  Administered 2022-10-11: 40 mg via INTRAVENOUS
  Filled 2022-10-11: qty 15

## 2022-10-11 MED ORDER — FAMCICLOVIR 500 MG PO TABS: 500.0000 mg | ORAL_TABLET | Freq: Every day | ORAL | 6 refills | Status: AC

## 2022-10-11 NOTE — Progress Notes (Unsigned)
Patient here for treatment after recent hospitalization for recurrent pleural effusion. Her disease is progressing and as such Dr Marin Olp will make a treatment change.   Oncology Nurse Navigator Documentation     10/11/2022    9:00 AM  Oncology Nurse Navigator Flowsheets  Navigator Follow Up Date: 10/18/2022  Navigator Follow Up Reason: Follow-up Appointment;Chemotherapy  Navigator Location [facility]  Navigator Encounter Type Appt/Treatment Plan Review;Treatment  Patient Visit Type MedOnc  Treatment Phase Active Tx  Barriers/Navigation Needs Coordination of Care;Education  Interventions Psycho-Social Support  Acuity Level 2-Minimal Needs (1-2 Barriers Identified)  Support Groups/Services Friends and Family  Time Spent with Patient 15

## 2022-10-11 NOTE — Progress Notes (Signed)
Hematology and Oncology Follow Up Visit  Michelle [name] [id number] January 14, 1961 61 y.o. 10/11/2022   Principle Diagnosis:  IgG Kappa myeloma -- normal cytogenetics -- dup 1p/17p-/t(14:20) Anemia of renal failure -- stage 3b  Current Therapy:   Faspro//Cytoxan/Kyprolis /Decadron -- start cycle #1 on 10/11/2022 XRT to lumbar spine -- completed on 06/26/2022 XRT to the skull base --started on 10/07/2022 Xgeva 120 mg sq q 3 months - next dose in 01/2023 Aranesp 300 mcg sq q month for Hgb < 11     Interim History:  Michelle [name] is back for follow-up.  Unfortunately, I think that we already seen some progression of her disease.  Surprising, she has had problems with recurrent malignant right pleural effusions.  She was hospitalized a couple times.  Last hospitalization was in late November.  She had a malignant pleural effusion.  She did have talc pleurodesis.  The cytology came back positive for myeloma cells.  She also had double vision.  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  She currently is undergoing radiotherapy to the skull base.  When we last checked her myeloma studies, her kappa light chain actually went from 56 mg/dL up to 97 mg/dL.  Surprisingly, her monoclonal spike was better at 0.3 g/dL.  Her IgG level was down to 735 mg/dL.  She comes in a wheelchair.  I think she is able to walk.  She is somewhat weak.  She is having some problems with constipation.  Regarding have to make a change with her protocol.  I would have to take out the Velcade and try to add Kyprolis.  I think this would help [place].  She will continue on the Cytoxan and Faspro.  She seems to be eating a little bit better maybe.  She is having no problems with nausea or vomiting.  There is been relatively good pain control.  Her blood pressure is on the low side.  I really do not think she needs to have the Zestoretic.  I will also stop her metoprolol.  Her heart rate is 100.  As such, monitor she is  taking the metoprolol or how effective it is.  We did give her Aranesp.  I must say that this is a lot more challenging than I would have thought.  She had a wonderful response initially to treatment.  Her monoclonal studies went down incredibly well.  It appears that she may be developing a resistance pretty quickly.  She has had no obvious bleeding.  She is complaining of some tightness overall in the right chest wall.  I think this might be secondary to the talc pleurodesis that she received.  Overall, I would say that her performance status is probably ECOG 1.  Medications:  Current Outpatient Medications:    diazepam (VALIUM) 5 MG tablet, Take thirty minutes before MRI for anxiety. (Patient taking differently: Take 5 mg by mouth daily as needed (Take thirty minutes before MRI for anxiety).), Disp: 1 tablet, Rfl: 0   fluconazole (DIFLUCAN) 100 MG tablet, Take 100 mg by mouth daily., Disp: , Rfl:    fluticasone (FLONASE) 50 MCG/ACT nasal spray, Place 1 spray into both nostrils daily. (Patient not taking: Reported on 08/02/2022), Disp: , Rfl:    lactulose (CHRONULAC) 10 GM/15ML solution, TAKE 15 MLS (10 G TOTAL) BY MOUTH 2 (TWO) TIMES DAILY., Disp: 708 mL, Rfl: 1   levothyroxine (SYNTHROID) 88 MCG tablet, Take 88 mcg by mouth daily before breakfast., Disp: ,  Rfl:    lisinopril-hydrochlorothiazide (ZESTORETIC) 10-12.5 MG tablet, Take 1 tablet by mouth daily., Disp: , Rfl:    metoprolol tartrate (LOPRESSOR) 25 MG tablet, Take 1 tablet (25 mg total) by mouth 2 (two) times daily., Disp: 60 tablet, Rfl: 1   Multiple Vitamin (MULTIVITAMIN WITH MINERALS) TABS tablet, Take 1 tablet by mouth daily., Disp: , Rfl:    ondansetron (ZOFRAN) 8 MG tablet, Take 1 tablet (8 mg total) by mouth every 8 (eight) hours as needed for nausea or vomiting., Disp: 20 tablet, Rfl: 2   Oxycodone HCl 10 MG TABS, Take 1 tablet (10 mg total) by mouth every 4 (four) hours as needed. (Patient taking differently: Take 10 mg by  mouth every 4 (four) hours as needed (for pain).), Disp: 120 tablet, Rfl: 0   pantoprazole (PROTONIX) 40 MG tablet, Take 1 tablet (40 mg total) by mouth 2 (two) times daily., Disp: 60 tablet, Rfl: 6   prochlorperazine (COMPAZINE) 10 MG tablet, Take 1 tablet (10 mg total) by mouth every 6 (six) hours as needed for nausea or vomiting. (Patient not taking: Reported on 09/06/2022), Disp: 30 tablet, Rfl: 5   rosuvastatin (CRESTOR) 10 MG tablet, Take 10 mg by mouth every evening., Disp: , Rfl:    senna (SENOKOT) 8.6 MG TABS tablet, Take 1 tablet (8.6 mg total) by mouth daily as needed for mild constipation or moderate constipation., Disp: 30 tablet, Rfl: 0  Allergies:  Allergies  Allergen Reactions   Penicillins Other (See Comments)    Severe Yeast infection... No deathly reactions   Codeine Nausea Only   Morphine Other (See Comments)    headache   Tramadol Other (See Comments)    Headaches     Past Medical History, Surgical history, Social history, and Family History were reviewed and updated.  Review of Systems: Review of Systems  Constitutional:  Positive for fatigue.  HENT:  Negative.    Eyes: Negative.   Respiratory: Negative.    Cardiovascular:  Positive for leg swelling.  Gastrointestinal: Negative.   Endocrine: Negative.   Genitourinary: Negative.    Musculoskeletal:  Positive for arthralgias, back pain and myalgias.  Skin: Negative.   Neurological: Negative.   Hematological: Negative.   Psychiatric/Behavioral: Negative.      Physical Exam:  height is '5\' 4"'$  (1.626 m) and weight is 162 lb (73.5 kg). Her oral temperature is 98 F (36.7 C). Her blood pressure is 106/61 and her pulse is 101 (abnormal). Her respiration is 18 and oxygen saturation is 100%.   Wt Readings from Last 3 Encounters:  10/11/22 162 lb (73.5 kg)  10/01/22 166 lb 10.7 oz (75.6 kg)  09/06/22 176 lb (79.8 kg)    Physical Exam Vitals reviewed.  HENT:     Head: Normocephalic and atraumatic.  Eyes:      Pupils: Pupils are equal, round, and reactive to light.  Cardiovascular:     Rate and Rhythm: Normal rate and regular rhythm.     Heart sounds: Normal heart sounds.  Pulmonary:     Effort: Pulmonary effort is normal.     Breath sounds: Normal breath sounds.  Abdominal:     General: Bowel sounds are normal.     Palpations: Abdomen is soft.  Musculoskeletal:        General: No tenderness or deformity. Normal range of motion.     Cervical back: Normal range of motion.     Comments: Her lower extremities does show some swelling bilateral.  She probably has 2+  edema bilaterally in her legs.  Lymphadenopathy:     Cervical: No cervical adenopathy.  Skin:    General: Skin is warm and dry.     Findings: No erythema or rash.  Neurological:     Mental Status: She is alert and oriented to person, place, and time.  Psychiatric:        Behavior: Behavior normal.        Thought Content: Thought content normal.        Judgment: Judgment normal.     Lab Results  Component Value Date   WBC 8.3 10/11/2022   HGB 10.7 (L) 10/11/2022   HCT 33.5 (L) 10/11/2022   MCV 85.5 10/11/2022   PLT 246 10/11/2022     Chemistry      Component Value Date/Time   NA 136 10/11/2022 0848   K 3.7 10/11/2022 0848   CL 96 (L) 10/11/2022 0848   CO2 28 10/11/2022 0848   BUN 15 10/11/2022 0848   CREATININE 2.22 (H) 10/11/2022 0848      Component Value Date/Time   CALCIUM 10.8 (H) 10/11/2022 0848   ALKPHOS 96 10/11/2022 0848   AST 27 10/11/2022 0848   ALT 10 10/11/2022 0848   BILITOT 0.5 10/11/2022 0848      Impression and Plan: Ms. Muise is a very nice 61 year old African-American female.  She has IgG kappa myeloma.  The problem clearly is with the kappa light chains.  Will be incredibly interesting to see where the kappa light chains are now.  She had the talc pleurodesis.  She is getting radiation therapy to the skull base.  Hopefully, the radiation to the skull base will help with her double  vision.  Our goal still is to try to get her to a stem cell transplant.  She clearly has high risk disease.  Hopefully, the change to Kyprolis from Velcade will help with her response.  Another possibility would be to add a  IMMD -Revlimid or Pomalyst.  However, she does have renal insufficiency.  We do not have to follow her incredibly closely.  I think she is to come back to see [place] in 1 week when she gets her next week of treatment.  We will have to be aggressive and treat her 3 weeks on and 1 week off.  Hopefully, we will be able to keep her out of the hospital.  I know she is trying hard.  She has incredible support from her family.  I really have to give a lot of people credit for how well she has done.    Volanda Napoleon, MD 12/8/20239:53 AM

## 2022-10-11 NOTE — Patient Instructions (Signed)
[facility]  Discharge Instructions: Thank you for choosing [facility] to provide your oncology and hematology care.   If you have a lab appointment with the [facility], please go directly to the [facility] and check in at the registration area.  Wear comfortable clothing and clothing appropriate for easy access to any Portacath or PICC line.   We strive to give you quality time with your provider. You may need to reschedule your appointment if you arrive late (15 or more minutes).  Arriving late affects you and other patients whose appointments are after yours.  Also, if you miss three or more appointments without notifying the office, you may be dismissed from the clinic at the provider's discretion.      For prescription refill requests, have your pharmacy contact our office and allow 72 hours for refills to be completed.    Today you received the following chemotherapy and/or immunotherapy agents Kyprolis, Faspro and Cytoxan       To help prevent nausea and vomiting after your treatment, we encourage you to take your nausea medication as directed.  BELOW ARE SYMPTOMS THAT SHOULD BE REPORTED IMMEDIATELY: *FEVER GREATER THAN 100.4 F (38 C) OR HIGHER *CHILLS OR SWEATING *NAUSEA AND VOMITING THAT IS NOT CONTROLLED WITH YOUR NAUSEA MEDICATION *UNUSUAL SHORTNESS OF BREATH *UNUSUAL BRUISING OR BLEEDING *URINARY PROBLEMS (pain or burning when urinating, or frequent urination) *BOWEL PROBLEMS (unusual diarrhea, constipation, pain near the anus) TENDERNESS IN MOUTH AND THROAT WITH OR WITHOUT PRESENCE OF ULCERS (sore throat, sores in mouth, or a toothache) UNUSUAL RASH, SWELLING OR PAIN  UNUSUAL VAGINAL DISCHARGE OR ITCHING   Items with * indicate a potential emergency and should be followed up as soon as possible or go to the Emergency Department if any problems should occur.  Please show the CHEMOTHERAPY ALERT CARD or IMMUNOTHERAPY ALERT CARD  at check-in to the Emergency Department and triage nurse. Should you have questions after your visit or need to cancel or reschedule your appointment, please contact [facility]  [phone] and follow the prompts.  Office hours are 8:00 a.m. to 4:30 p.m. Monday - Friday. Please note that voicemails left after 4:00 p.m. may not be returned until the following business day.  We are closed weekends and major holidays. You have access to a nurse at all times for urgent questions. Please call the main number to the clinic [phone] and follow the prompts.  For any non-urgent questions, you may also contact your provider using MyChart. We now offer e-Visits for anyone 59 and older to request care online for non-urgent symptoms. For details visit [email or web address].   Also download the MyChart app! Go to the app store, search "MyChart", open the app, select , and log in with your MyChart username and password.  Masks are optional in the cancer centers. If you would like for your care team to wear a mask while they are taking care of you, please let them know. You may have one support person who is at least 63 years old accompany you for your appointments.

## 2022-10-11 NOTE — Patient Instructions (Signed)

## 2022-10-11 NOTE — Progress Notes (Signed)
Ok to treat with creatinine of 2.2 per Dr Ennever. dph 

## 2022-10-13 ENCOUNTER — Other Ambulatory Visit: Payer: Self-pay

## 2022-10-13 LAB — IGG, IGA, IGM
IgA: <5 mg/dL — ABNORMAL LOW (ref 87–352)
IgG (Immunoglobin G), Serum: 959 mg/dL (ref 586–1602)
IgM (Immunoglobulin M), Srm: <5 mg/dL — ABNORMAL LOW (ref 26–217)

## 2022-10-14 ENCOUNTER — Other Ambulatory Visit: Payer: Self-pay | Admitting: Radiation Oncology

## 2022-10-14 ENCOUNTER — Telehealth: Payer: Self-pay

## 2022-10-14 ENCOUNTER — Ambulatory Visit
Admission: RE | Admit: 2022-10-14 | Discharge: 2022-10-14 | Disposition: A | Discharge status: Home / Self Care | Payer: BC Managed Care – PPO | Source: Ambulatory Visit | Attending: Radiation Oncology | Admitting: Radiation Oncology

## 2022-10-14 ENCOUNTER — Other Ambulatory Visit: Payer: Self-pay

## 2022-10-14 DIAGNOSIS — C9000 Multiple myeloma not having achieved remission: Secondary | ICD-10-CM

## 2022-10-14 DIAGNOSIS — N179 Acute kidney failure, unspecified: Secondary | ICD-10-CM | POA: Diagnosis not present

## 2022-10-14 DIAGNOSIS — E860 Dehydration: Secondary | ICD-10-CM | POA: Diagnosis not present

## 2022-10-14 LAB — RAD ONC ARIA SESSION SUMMARY
Course Elapsed Days: 11
Plan Fractions Treated to Date: 7
Plan Prescribed Dose Per Fraction: 2 Gy
Plan Total Fractions Prescribed: 10
Plan Total Prescribed Dose: 20 Gy
Reference Point Dosage Given to Date: 14 Gy
Reference Point Session Dosage Given: 2 Gy
Session Number: 7

## 2022-10-14 LAB — KAPPA/LAMBDA LIGHT CHAINS
Kappa free light chain: 3158.7 mg/L — ABNORMAL HIGH (ref 3.3–19.4)
Kappa, lambda light chain ratio: 770.41 — ABNORMAL HIGH (ref 0.26–1.65)
Lambda free light chains: 4.1 mg/L — ABNORMAL LOW (ref 5.7–26.3)

## 2022-10-14 MED ORDER — OLANZAPINE 5 MG PO TABS: 5.0000 mg | ORAL_TABLET | Freq: Every day | ORAL | 0 refills | Status: DC

## 2022-10-14 NOTE — Telephone Encounter (Signed)
Received call from pt reporting nausea/vomiting/anorexia over the weekend. Pt started new chemotherapy regimen on Friday. Michelle [name] reports she had 2 episodes vomiting Saturday & Sunday and has vomited once this am. She is attempting to push po fluids but "doesn't want to overdo it." She was able to eat applesauce with her am meds. She is taking Zofran '8mg'$  without relief. She prefers not to take Compazine d/t it "knocks me out."   Per Dr Marin Olp, Zyprexa called to pt's pharmacy. Script instructions to take at bedtime but pt understands to take one as she as she gets it filled, then start bedtime dose tomorrow. Aware to continue Zofran as ordered. Pt aware and verbalizes understanding using teachback. dph

## 2022-10-15 ENCOUNTER — Other Ambulatory Visit: Payer: Self-pay

## 2022-10-15 ENCOUNTER — Ambulatory Visit
Admission: RE | Admit: 2022-10-15 | Discharge: 2022-10-15 | Disposition: A | Discharge status: Home / Self Care | Payer: BC Managed Care – PPO | Source: Ambulatory Visit | Attending: Radiation Oncology | Admitting: Radiation Oncology

## 2022-10-15 ENCOUNTER — Other Ambulatory Visit: Payer: Self-pay | Admitting: Hematology & Oncology

## 2022-10-15 DIAGNOSIS — K5900 Constipation, unspecified: Secondary | ICD-10-CM

## 2022-10-15 LAB — RAD ONC ARIA SESSION SUMMARY
Course Elapsed Days: 12
Plan Fractions Treated to Date: 8
Plan Prescribed Dose Per Fraction: 2 Gy
Plan Total Fractions Prescribed: 10
Plan Total Prescribed Dose: 20 Gy
Reference Point Dosage Given to Date: 16 Gy
Reference Point Session Dosage Given: 2 Gy
Session Number: 8

## 2022-10-16 ENCOUNTER — Ambulatory Visit: Payer: BC Managed Care – PPO

## 2022-10-16 ENCOUNTER — Other Ambulatory Visit: Payer: Self-pay

## 2022-10-16 ENCOUNTER — Encounter: Payer: Self-pay | Admitting: Hematology & Oncology

## 2022-10-16 ENCOUNTER — Ambulatory Visit
Admission: RE | Admit: 2022-10-16 | Discharge: 2022-10-16 | Disposition: A | Discharge status: Home / Self Care | Payer: BC Managed Care – PPO | Source: Ambulatory Visit | Attending: Radiation Oncology | Admitting: Radiation Oncology

## 2022-10-16 LAB — RAD ONC ARIA SESSION SUMMARY
Course Elapsed Days: 13
Plan Fractions Treated to Date: 9
Plan Prescribed Dose Per Fraction: 2 Gy
Plan Total Fractions Prescribed: 10
Plan Total Prescribed Dose: 20 Gy
Reference Point Dosage Given to Date: 18 Gy
Reference Point Session Dosage Given: 2 Gy
Session Number: 9

## 2022-10-17 ENCOUNTER — Encounter: Payer: Self-pay | Admitting: Primary Care

## 2022-10-17 ENCOUNTER — Other Ambulatory Visit: Payer: Self-pay

## 2022-10-17 ENCOUNTER — Ambulatory Visit: Payer: BC Managed Care – PPO | Admitting: Primary Care

## 2022-10-17 ENCOUNTER — Telehealth: Payer: Self-pay

## 2022-10-17 ENCOUNTER — Ambulatory Visit (INDEPENDENT_AMBULATORY_CARE_PROVIDER_SITE_OTHER): Payer: BC Managed Care – PPO

## 2022-10-17 ENCOUNTER — Inpatient Hospital Stay (HOSPITAL_COMMUNITY)
Admission: EM | Admit: 2022-10-17 | Discharge: 2022-10-31 | DRG: 683 | Disposition: A | Discharge status: Home / Self Care | Payer: BC Managed Care – PPO | Attending: Internal Medicine | Admitting: Internal Medicine

## 2022-10-17 ENCOUNTER — Ambulatory Visit
Admission: RE | Admit: 2022-10-17 | Discharge: 2022-10-17 | Disposition: A | Discharge status: Home / Self Care | Payer: BC Managed Care – PPO | Source: Ambulatory Visit | Attending: Radiation Oncology | Admitting: Radiation Oncology

## 2022-10-17 ENCOUNTER — Telehealth: Payer: Self-pay | Admitting: *Deleted

## 2022-10-17 ENCOUNTER — Ambulatory Visit (HOSPITAL_COMMUNITY)
Admission: RE | Admit: 2022-10-17 | Discharge: 2022-10-17 | Disposition: A | Discharge status: Home / Self Care | Payer: BC Managed Care – PPO | Source: Ambulatory Visit | Attending: Hematology & Oncology | Admitting: Hematology & Oncology

## 2022-10-17 ENCOUNTER — Other Ambulatory Visit: Payer: Self-pay | Admitting: *Deleted

## 2022-10-17 VITALS — BP 132/70 | HR 138 | Temp 98.2°F | Ht 64.25 in | Wt 165.0 lb

## 2022-10-17 DIAGNOSIS — D630 Anemia in neoplastic disease: Secondary | ICD-10-CM | POA: Diagnosis present

## 2022-10-17 DIAGNOSIS — J90 Pleural effusion, not elsewhere classified: Secondary | ICD-10-CM | POA: Diagnosis not present

## 2022-10-17 DIAGNOSIS — N1831 Chronic kidney disease, stage 3a: Secondary | ICD-10-CM

## 2022-10-17 DIAGNOSIS — C9000 Multiple myeloma not having achieved remission: Secondary | ICD-10-CM

## 2022-10-17 DIAGNOSIS — Z801 Family history of malignant neoplasm of trachea, bronchus and lung: Secondary | ICD-10-CM | POA: Diagnosis not present

## 2022-10-17 DIAGNOSIS — J910 Malignant pleural effusion: Secondary | ICD-10-CM | POA: Diagnosis present

## 2022-10-17 DIAGNOSIS — R278 Other lack of coordination: Secondary | ICD-10-CM | POA: Diagnosis present

## 2022-10-17 DIAGNOSIS — N179 Acute kidney failure, unspecified: Secondary | ICD-10-CM | POA: Diagnosis not present

## 2022-10-17 DIAGNOSIS — R531 Weakness: Secondary | ICD-10-CM | POA: Diagnosis not present

## 2022-10-17 DIAGNOSIS — Z515 Encounter for palliative care: Secondary | ICD-10-CM

## 2022-10-17 DIAGNOSIS — N1832 Chronic kidney disease, stage 3b: Secondary | ICD-10-CM | POA: Diagnosis present

## 2022-10-17 DIAGNOSIS — Z833 Family history of diabetes mellitus: Secondary | ICD-10-CM | POA: Diagnosis not present

## 2022-10-17 DIAGNOSIS — R000 Tachycardia, unspecified: Secondary | ICD-10-CM | POA: Diagnosis present

## 2022-10-17 DIAGNOSIS — M84522A Pathological fracture in neoplastic disease, left humerus, initial encounter for fracture: Secondary | ICD-10-CM | POA: Diagnosis present

## 2022-10-17 DIAGNOSIS — D631 Anemia in chronic kidney disease: Secondary | ICD-10-CM | POA: Diagnosis present

## 2022-10-17 DIAGNOSIS — Z79890 Hormone replacement therapy (postmenopausal): Secondary | ICD-10-CM

## 2022-10-17 DIAGNOSIS — Z6830 Body mass index (BMI) 30.0-30.9, adult: Secondary | ICD-10-CM | POA: Diagnosis not present

## 2022-10-17 DIAGNOSIS — Z823 Family history of stroke: Secondary | ICD-10-CM

## 2022-10-17 DIAGNOSIS — Z87891 Personal history of nicotine dependence: Secondary | ICD-10-CM | POA: Diagnosis not present

## 2022-10-17 DIAGNOSIS — D464 Refractory anemia, unspecified: Secondary | ICD-10-CM | POA: Diagnosis not present

## 2022-10-17 DIAGNOSIS — E860 Dehydration: Secondary | ICD-10-CM | POA: Diagnosis not present

## 2022-10-17 DIAGNOSIS — Z01810 Encounter for preprocedural cardiovascular examination: Secondary | ICD-10-CM

## 2022-10-17 DIAGNOSIS — E870 Hyperosmolality and hypernatremia: Secondary | ICD-10-CM | POA: Diagnosis present

## 2022-10-17 DIAGNOSIS — Z7189 Other specified counseling: Secondary | ICD-10-CM

## 2022-10-17 DIAGNOSIS — Z880 Allergy status to penicillin: Secondary | ICD-10-CM

## 2022-10-17 DIAGNOSIS — C7951 Secondary malignant neoplasm of bone: Secondary | ICD-10-CM | POA: Insufficient documentation

## 2022-10-17 DIAGNOSIS — K219 Gastro-esophageal reflux disease without esophagitis: Secondary | ICD-10-CM

## 2022-10-17 DIAGNOSIS — Z841 Family history of disorders of kidney and ureter: Secondary | ICD-10-CM

## 2022-10-17 DIAGNOSIS — I129 Hypertensive chronic kidney disease with stage 1 through stage 4 chronic kidney disease, or unspecified chronic kidney disease: Secondary | ICD-10-CM | POA: Diagnosis present

## 2022-10-17 DIAGNOSIS — E669 Obesity, unspecified: Secondary | ICD-10-CM | POA: Diagnosis present

## 2022-10-17 DIAGNOSIS — Z95828 Presence of other vascular implants and grafts: Secondary | ICD-10-CM

## 2022-10-17 DIAGNOSIS — Z885 Allergy status to narcotic agent status: Secondary | ICD-10-CM

## 2022-10-17 DIAGNOSIS — E039 Hypothyroidism, unspecified: Secondary | ICD-10-CM | POA: Diagnosis present

## 2022-10-17 DIAGNOSIS — Z79899 Other long term (current) drug therapy: Secondary | ICD-10-CM

## 2022-10-17 DIAGNOSIS — I2510 Atherosclerotic heart disease of native coronary artery without angina pectoris: Secondary | ICD-10-CM | POA: Diagnosis present

## 2022-10-17 DIAGNOSIS — N289 Disorder of kidney and ureter, unspecified: Secondary | ICD-10-CM | POA: Diagnosis not present

## 2022-10-17 DIAGNOSIS — E785 Hyperlipidemia, unspecified: Secondary | ICD-10-CM | POA: Diagnosis present

## 2022-10-17 DIAGNOSIS — D696 Thrombocytopenia, unspecified: Secondary | ICD-10-CM | POA: Diagnosis present

## 2022-10-17 DIAGNOSIS — Z8249 Family history of ischemic heart disease and other diseases of the circulatory system: Secondary | ICD-10-CM | POA: Diagnosis not present

## 2022-10-17 LAB — URINALYSIS, ROUTINE W REFLEX MICROSCOPIC
Bilirubin Urine: NEGATIVE
Glucose, UA: NEGATIVE mg/dL
Hgb urine dipstick: NEGATIVE
Ketones, ur: NEGATIVE mg/dL
Nitrite: NEGATIVE
Protein, ur: NEGATIVE mg/dL
Specific Gravity, Urine: 1.006 (ref 1.005–1.030)
pH: 5 (ref 5.0–8.0)

## 2022-10-17 LAB — PROTEIN ELECTROPHORESIS, SERUM, WITH REFLEX
A/G Ratio: 1.1 (ref 0.7–1.7)
Albumin ELP: 3.3 g/dL (ref 2.9–4.4)
Alpha-1-Globulin: 0.3 g/dL (ref 0.0–0.4)
Alpha-2-Globulin: 1 g/dL (ref 0.4–1.0)
Beta Globulin: 0.8 g/dL (ref 0.7–1.3)
Gamma Globulin: 0.8 g/dL (ref 0.4–1.8)
Globulin, Total: 2.9 g/dL (ref 2.2–3.9)
M-Spike, %: 0.5 g/dL — ABNORMAL HIGH
SPEP Interpretation: 0
Total Protein ELP: 6.2 g/dL (ref 6.0–8.5)

## 2022-10-17 LAB — IMMUNOFIXATION REFLEX, SERUM
IgA: <5 mg/dL — ABNORMAL LOW (ref 87–352)
IgG (Immunoglobin G), Serum: 1086 mg/dL (ref 586–1602)
IgM (Immunoglobulin M), Srm: <5 mg/dL — ABNORMAL LOW (ref 26–217)

## 2022-10-17 LAB — RAD ONC ARIA SESSION SUMMARY
Course Elapsed Days: 14
Plan Fractions Treated to Date: 10
Plan Prescribed Dose Per Fraction: 2 Gy
Plan Total Fractions Prescribed: 10
Plan Total Prescribed Dose: 20 Gy
Reference Point Dosage Given to Date: 20 Gy
Reference Point Session Dosage Given: 2 Gy
Session Number: 10

## 2022-10-17 LAB — CBC
HCT: 30.5 % — ABNORMAL LOW (ref 36.0–46.0)
HCT: 28.9 % — ABNORMAL LOW (ref 36.0–46.0)
Hemoglobin: 10 g/dL — ABNORMAL LOW (ref 12.0–15.0)
Hemoglobin: 9.3 g/dL — ABNORMAL LOW (ref 12.0–15.0)
MCH: 28.7 pg (ref 26.0–34.0)
MCH: 28 pg (ref 26.0–34.0)
MCHC: 32.8 g/dL (ref 30.0–36.0)
MCHC: 32.2 g/dL (ref 30.0–36.0)
MCV: 87.4 fL (ref 80.0–100.0)
MCV: 87 fL (ref 80.0–100.0)
Platelets: 219 10*3/uL (ref 150–400)
Platelets: 200 10*3/uL (ref 150–400)
RBC: 3.49 MIL/uL — ABNORMAL LOW (ref 3.87–5.11)
RBC: 3.32 MIL/uL — ABNORMAL LOW (ref 3.87–5.11)
RDW: 15.9 % — ABNORMAL HIGH (ref 11.5–15.5)
RDW: 15.8 % — ABNORMAL HIGH (ref 11.5–15.5)
WBC: 8.4 10*3/uL (ref 4.0–10.5)
WBC: 7.6 10*3/uL (ref 4.0–10.5)
nRBC: 0 % (ref 0.0–0.2)
nRBC: 0 % (ref 0.0–0.2)

## 2022-10-17 LAB — BASIC METABOLIC PANEL
Anion gap: 15 (ref 5–15)
BUN: 51 mg/dL — ABNORMAL HIGH (ref 8–23)
CO2: 22 mmol/L (ref 22–32)
Calcium: 10.2 mg/dL (ref 8.9–10.3)
Chloride: 100 mmol/L (ref 98–111)
Creatinine, Ser: 7.72 mg/dL — ABNORMAL HIGH (ref 0.44–1.00)
GFR, Estimated: 5 mL/min — ABNORMAL LOW (ref >60)
Glucose, Bld: 103 mg/dL — ABNORMAL HIGH (ref 70–99)
Potassium: 5.1 mmol/L (ref 3.5–5.1)
Sodium: 137 mmol/L (ref 135–145)

## 2022-10-17 LAB — CREATININE, SERUM
Creatinine, Ser: 7.16 mg/dL — ABNORMAL HIGH (ref 0.44–1.00)
GFR, Estimated: 6 mL/min — ABNORMAL LOW (ref >60)

## 2022-10-17 MED ORDER — HEPARIN SODIUM (PORCINE) 5000 UNIT/ML IJ SOLN
5000.0000 [IU] | Freq: Three times a day (TID) | INTRAMUSCULAR | Status: DC
  Administered 2022-10-17 – 2022-10-22 (×15): 5000 [IU] via SUBCUTANEOUS
  Filled 2022-10-17 (×15): qty 1

## 2022-10-17 MED ORDER — LEVOTHYROXINE SODIUM 88 MCG PO TABS
88.0000 ug | ORAL_TABLET | Freq: Every day | ORAL | Status: DC
  Administered 2022-10-18 – 2022-10-31 (×14): 88 ug via ORAL
  Filled 2022-10-17 (×14): qty 1

## 2022-10-17 MED ORDER — ROSUVASTATIN CALCIUM 10 MG PO TABS
10.0000 mg | ORAL_TABLET | Freq: Every evening | ORAL | Status: DC
  Administered 2022-10-17: 10 mg via ORAL
  Filled 2022-10-17: qty 1

## 2022-10-17 MED ORDER — SODIUM CHLORIDE 0.9 % IV BOLUS
1000.0000 mL | Freq: Once | INTRAVENOUS | Status: AC
  Administered 2022-10-17: 1000 mL via INTRAVENOUS

## 2022-10-17 MED ORDER — HYDROMORPHONE HCL 1 MG/ML IJ SOLN
1.0000 mg | Freq: Once | INTRAMUSCULAR | Status: AC
  Administered 2022-10-17: 1 mg via INTRAVENOUS
  Filled 2022-10-17: qty 1

## 2022-10-17 MED ORDER — PROCHLORPERAZINE EDISYLATE 10 MG/2ML IJ SOLN
5.0000 mg | INTRAMUSCULAR | Status: AC
  Administered 2022-10-17: 5 mg via INTRAVENOUS
  Filled 2022-10-17: qty 2

## 2022-10-17 MED ORDER — HYDROMORPHONE HCL 1 MG/ML IJ SOLN
1.0000 mg | INTRAMUSCULAR | Status: AC
  Administered 2022-10-17: 1 mg via INTRAVENOUS
  Filled 2022-10-17: qty 1

## 2022-10-17 NOTE — Assessment & Plan Note (Addendum)
-   S/p talc pleurodesis of malignant pleural effusion. Respiratory wise she is doing well. She has no shortness of breath symptoms and O2 levels are 100% on room air. CXR today showed progression right pleural effusion, slight increase pleural density RUL. Discuss with Dr. Valeta Harms and Tamala Julian, since patient is asymptomatic we will hold off on further procedure at this time. If she becomes symptomatic we can re-address thoracentesis vs pleurx catheter. Recommend repeating CXR in 2 weeks.

## 2022-10-17 NOTE — Telephone Encounter (Signed)
Pt called Rn Anderson Malta to notify staff that she will be late for her appointment. She stated she was another appointment and needed to get x rays done before leaving the office. She stated getting these x rays done will make her late for her appointment. Rn called [facility] to let them know that pt will be late and they assured RN they will work her in. Rn called pt back letting her know to come in when she can. Pt understood the instructions.

## 2022-10-17 NOTE — Telephone Encounter (Signed)
Per Dr. Marin Olp, I called patient and informed her that she has a broken left arm. Told her that she needs to go to [facility] ER to have it looked at. She verbalized understanding.

## 2022-10-17 NOTE — ED Provider Notes (Signed)
[facility] DEPT Provider Note   CSN: [id number] Arrival date & time: 10/17/22  1623     History  Chief Complaint  Patient presents with   Arm Injury    Michelle [name] is a 61 y.o. female.   Arm Injury    Patient has a history of multiple myeloma, hyperlipidemia, hypertension, hypercalcemia, pleural effusions, chronic kidney disease.  Patient states she had an episode last Thursday where she had been having trouble with nausea and vomiting.  She braced herself with her arm.  She did experience some pain in the left arm at that time.  This morning when she was getting dressed she felt a crunching in her left arm.  The pain and discomfort she experiences is in the middle of her left upper arm.  Patient went to the radiation oncology today.  She had x-rays performed.  Patient was told she had a humerus fracture and was sent to the ED.  Home Medications Prior to Admission medications   Medication Sig Start Date End Date Taking? Authorizing Provider  diazepam (VALIUM) 5 MG tablet Take thirty minutes before MRI for anxiety. Patient taking differently: Take 5 mg by mouth daily as needed (Take thirty minutes before MRI for anxiety). 09/12/22   Volanda Napoleon, MD  famciclovir (FAMVIR) 500 MG tablet Take 1 tablet (500 mg total) by mouth daily. 10/11/22   Volanda Napoleon, MD  fluconazole (DIFLUCAN) 100 MG tablet Take 100 mg by mouth daily. 07/28/22   [provider]  fluticasone (FLONASE) 50 MCG/ACT nasal spray Place 1 spray into both nostrils daily. Patient not taking: Reported on 10/17/2022 07/26/22   [provider]  lactulose (CHRONULAC) 10 GM/15ML solution TAKE 15 MLS (10 G TOTAL) BY MOUTH 2 (TWO) TIMES DAILY. 10/15/22   Volanda Napoleon, MD  levothyroxine (SYNTHROID) 88 MCG tablet Take 88 mcg by mouth daily before breakfast.    [provider]  metoprolol tartrate (LOPRESSOR) 25 MG tablet Take 1 tablet (25 mg total) by mouth 2 (two)  times daily. Patient not taking: Reported on 10/17/2022 06/26/22   Modena Jansky, MD  Multiple Vitamin (MULTIVITAMIN WITH MINERALS) TABS tablet Take 1 tablet by mouth daily. 06/27/22   Hongalgi, Lenis Dickinson, MD  OLANZapine (ZYPREXA) 5 MG tablet Take 1 tablet (5 mg total) by mouth at bedtime. 10/14/22   Volanda Napoleon, MD  ondansetron (ZOFRAN) 8 MG tablet Take 1 tablet (8 mg total) by mouth every 8 (eight) hours as needed for nausea or vomiting. 10/10/22   Volanda Napoleon, MD  Oxycodone HCl 10 MG TABS Take 1 tablet (10 mg total) by mouth every 4 (four) hours as needed. Patient taking differently: Take 10 mg by mouth every 4 (four) hours as needed (for pain). 09/13/22   Volanda Napoleon, MD  pantoprazole (PROTONIX) 40 MG tablet Take 1 tablet (40 mg total) by mouth 2 (two) times daily. 10/04/22   Volanda Napoleon, MD  prochlorperazine (COMPAZINE) 10 MG tablet Take 1 tablet (10 mg total) by mouth every 6 (six) hours as needed for nausea or vomiting. 06/14/22   Tyler Pita, MD  rosuvastatin (CRESTOR) 10 MG tablet Take 10 mg by mouth every evening.    [provider]  senna (SENOKOT) 8.6 MG TABS tablet Take 1 tablet (8.6 mg total) by mouth daily as needed for mild constipation or moderate constipation. 06/26/22   Modena Jansky, MD      Allergies    Penicillins, Codeine,  Morphine, and Tramadol    Review of Systems   Review of Systems  Physical Exam Updated Vital Signs BP (!) 147/79   Pulse (!) 138   Temp 98.3 F (36.8 C) (Oral)   Resp 20   Wt 74.8 kg   SpO2 100%   BMI 28.10 kg/m  Physical Exam Vitals and nursing note reviewed.  Constitutional:      Appearance: She is well-developed. She is not diaphoretic.  HENT:     Head: Normocephalic and atraumatic.     Right Ear: External ear normal.     Left Ear: External ear normal.  Eyes:     General: No scleral icterus.       Right eye: No discharge.        Left eye: No discharge.     Conjunctiva/sclera: Conjunctivae normal.   Neck:     Trachea: No tracheal deviation.  Cardiovascular:     Rate and Rhythm: Normal rate.  Pulmonary:     Effort: Pulmonary effort is normal. No respiratory distress.     Breath sounds: No stridor.  Abdominal:     General: There is no distension.  Musculoskeletal:        General: No swelling or deformity.     Cervical back: Neck supple.  Skin:    General: Skin is warm and dry.     Findings: No rash.  Neurological:     Mental Status: She is alert. Mental status is at baseline.     Cranial Nerves: No dysarthria or facial asymmetry.     Motor: No seizure activity.     ED Results / Procedures / Treatments   Labs (all labs ordered are listed, but only abnormal results are displayed) Labs Reviewed  CBC - Abnormal; Notable for the following components:      Result Value   RBC 3.49 (*)    Hemoglobin 10.0 (*)    HCT 30.5 (*)    RDW 15.9 (*)    All other components within normal limits  BASIC METABOLIC PANEL - Abnormal; Notable for the following components:   Glucose, Bld 103 (*)    BUN 51 (*)    Creatinine, Ser 7.72 (*)    GFR, Estimated 5 (*)    All other components within normal limits  URINALYSIS, ROUTINE W REFLEX MICROSCOPIC    EKG EKG Interpretation  Date/Time:  Thursday October 17 2022 17:01:34 EST Ventricular Rate:  140 PR Interval:  126 QRS Duration: 90 QT Interval:  325 QTC Calculation: 496 R Axis:   26 Text Interpretation: Sinus tachycardia Borderline T abnormalities, diffuse leads Borderline prolonged QT interval Baseline wander in lead(s) I III aVL aVF Since last tracing rate faster Confirmed by Dorie Rank [phone]) on 10/17/2022 5:07:56 PM  Radiology DG Humerus Left  Result Date: 10/17/2022 CLINICAL DATA:  History of multiple myeloma. Right arm pain after injury. EXAM: LEFT HUMERUS - 2+ VIEW COMPARISON:  None Available. FINDINGS: Mildly displaced fracture is seen involving the proximal left humeral shaft with irregular lucency in the area most  consistent with pathologic fracture. IMPRESSION: Mildly displaced pathologic fracture is seen involving the midshaft of the left humerus. Electronically Signed   By: Marijo Conception M.D.   On: 10/17/2022 13:25   DG Chest 2 View  Result Date: 10/17/2022 CLINICAL DATA:  Pain over left posterior chest wall. Multiple myeloma. EXAM: CHEST - 2 VIEW COMPARISON:  Chest 10/02/2022 FINDINGS: Heart size normal.  Negative for heart failure or edema. Interval removal of  right basilar chest tube since the prior study. No pneumothorax. Right pleural effusion is small but has progressed from the prior study. Pleural based density right upper lobe appears slightly larger and may be pleural fluid or mass lesion. Progressive right lower lobe airspace disease. Myeloma lesions in the ribs on the right are better seen on prior CT. Port-A-Cath tip in the lower SVC unchanged. IMPRESSION: 1. Interval removal of right basilar chest tube. No pneumothorax. 2. Right pleural effusion has progressed. Pleural based density right upper lobe appears slightly larger. 3. Progressive right lower lobe airspace disease. Electronically Signed   By: Franchot Gallo M.D.   On: 10/17/2022 10:32    Procedures .Critical Care  Performed by: Dorie Rank, MD Authorized by: Dorie Rank, MD   Critical care provider statement:    Critical care time (minutes):  35   Critical care was time spent personally by me on the following activities:  Development of treatment plan with patient or surrogate, discussions with consultants, evaluation of patient's response to treatment, examination of patient, ordering and review of laboratory studies, ordering and review of radiographic studies, ordering and performing treatments and interventions, pulse oximetry, re-evaluation of patient's condition and review of old charts     Medications Ordered in ED Medications  sodium chloride 0.9 % bolus 1,000 mL (has no administration in time range)  HYDROmorphone  (DILAUDID) injection 1 mg (1 mg Intravenous Given 10/17/22 1712)  sodium chloride 0.9 % bolus 1,000 mL (0 mLs Intravenous Stopped 10/17/22 1938)  HYDROmorphone (DILAUDID) injection 1 mg (1 mg Intravenous Given 10/17/22 1817)    ED Course/ Medical Decision Making/ A&P Clinical Course as of 10/17/22 2002  Thu Oct 17, 2022  1803 Case reviewed with Dr. Marlou Sa, he recommends capitation splint.  Outpatient follow-up in his office on Monday [JK]  9798 Basic metabolic panel(!) Labs notable for acute renal failure with BUN and creatinine elevated at 51 and 7.7 [JK]  1855 CBC(!) White blood cell count and hemoglobin stable compared to previous [JK]  1938 D/w Dr Nevada Crane regarding admission [JK]    Clinical Course User Index [JK] Dorie Rank, MD                           Medical Decision Making Problems Addressed: Acute renal failure, unspecified acute renal failure type [facility]): acute illness or injury that poses a threat to life or bodily functions Dehydration: acute illness or injury that poses a threat to life or bodily functions Multiple myeloma, remission status unspecified ([place]): chronic illness or injury with exacerbation, progression, or side effects of treatment Pathological fracture of left humerus due to neoplastic disease, initial encounter: acute illness or injury  Amount and/or Complexity of Data Reviewed Labs: ordered. Decision-making details documented in ED Course. Radiology: independent interpretation performed.    Details: Reviewed outpt xray Discussion of management or test interpretation with external provider(s): Dr Marlou Sa and Dr Nevada Crane  Risk Prescription drug management. Decision regarding hospitalization.   Patient presented to ED for evaluation of a pathologic fracture of the left humerus.  Patient had outpatient x-rays today that demonstrated the fracture.  Patient was sent to the ED for evaluation.  While she was here did note that she was tachycardic.  EKG showed a sinus  tachycardia.  IV fluids and labs were ordered.  Patient's labs are notable for new acute renal failure.  Patient's creatinine has been trending upward recently but acutely worse today.  Patient does mention some  issues with nausea vomiting.  It is possible to be a combination of dehydration as well as renal failure associated with her multiple myeloma.  Patient has been treated with IV fluids.  Her scan and UA ordered.  I will consult the medical service for admission and further treatment.        Final Clinical Impression(s) / ED Diagnoses Final diagnoses:  Acute renal failure, unspecified acute renal failure type [facility])  Pathological fracture of left humerus due to neoplastic disease, initial encounter  Dehydration  Multiple myeloma, remission status unspecified ([place])    Rx / DC Orders ED Discharge Orders     None         Dorie Rank, MD 10/17/22 2002

## 2022-10-17 NOTE — Patient Instructions (Addendum)
Chest x-ray today showed reaccumulation of right pleural effusion, however, you are minimally symptomatic respiratory wise so I would hold off on further lung procedure  Follow-up chest x-ray in 2 weeks to reassess fluid  If you become symptomatic with shortness of breath symptoms or oxygen level is less than 90% please notify office   Follow-up Virtual visit in 2 weeks with Beth NP (Come in for CXR prior)

## 2022-10-17 NOTE — ED Triage Notes (Signed)
Pt arrived to ED with complains of left arm broken. Pt states last Thursday was throwing up after radiation treatment and felt a pain in arm after throwing up but denies any injury. This morning woke and felt like her arm was "crunching"Today pt went to radiation and felt weak and started to go down but did not fall staff assisted pt to seat. Radiation send pt to radiology and was told left arm was broken and to come to ER.

## 2022-10-17 NOTE — H&P (Addendum)
History and Physical  Michelle [name]:[id number] DOB: [birth year] DOA: 10/17/2022  Referring physician: Dr. Tomi Bamberger, [place]. PCP: Leeroy Cha, MD  Outpatient Specialists: Medical oncology Patient coming from: Home.  Through the cancer center.  Chief Complaint: Left arm pain.  HPI: Michelle [name] is a 61 y.o. female with medical history significant for CKD 3B, multiple myeloma and malignant pleural effusion status post talc pleurodesis, status post radiation therapy to the skull base, treated at cancer center, who initially presented to radiation oncology at the cancer center with complaints of generalized weakness, left arm pain, and vomiting.  This morning when she was getting dressed she felt a crunching sound in her left arm.  Immediately experienced discomfort in the middle of her left upper arm.  Symptoms are gradually worsening for the past few days.   At the cancer center, she was noted to be tachycardic also.  X-rays were done.  The patient was informed she had a humerus fracture and was advised to go to the ED for further management.    In the ED, workup revealed left humerus fracture, AKI and sinus tachycardia.  EDP discussed the case with orthopedic surgery, Dr. Marlou Sa.  Orthopedic surgery recommended outpatient follow-up on Monday 10/18/2022.  No plan for surgical intervention at this time.  The patient prefers to be admitted at [facility] rather than [facility].  Admitted by [facility], hospitalist service.  ED Course: Tmax 98.5.  BP 134/74, pulse 137, respiratory 16, O2 saturation 98% on room air.  Lab studies remarkable for BUN 51 creatinine 7.72.  Review of Systems: Review of systems as noted in the HPI. All other systems reviewed and are negative.   Past Medical History:  Diagnosis Date   Anemia of chronic renal failure, stage 3b ([place]) 08/02/2022   Cancer ([place])    Essential hypertension 02/16/2017   Hyperlipemia    Hyperlipidemia 02/16/2017   Hypertension    Hypothyroidism  02/16/2017   Morbid obesity ([place]) 02/16/2017   Multiple myeloma without remission ([place]) 06/20/2022   Past Surgical History:  Procedure Laterality Date   IR IMAGING GUIDED PORT INSERTION  06/17/2022   IR THORACENTESIS ASP PLEURAL SPACE W/IMG GUIDE  06/17/2022   THYROIDECTOMY      Social History:  reports that she quit smoking about 33 years ago. Her smoking use included cigarettes. She has a 7.50 pack-year smoking history. She has never used smokeless tobacco. She reports that she does not drink alcohol and does not use drugs.   Allergies  Allergen Reactions   Penicillins Other (See Comments)    Severe Yeast infection... No deathly reactions   Codeine Nausea Only   Morphine Other (See Comments)    headache   Tramadol Other (See Comments)    Headaches     Family History  Problem Relation Age of Onset   Diabetes Paternal Grandfather    Stroke Paternal Grandfather    Heart attack Paternal Grandfather    Diabetes Father    Hypertension Father    Stroke Father    Lung cancer Father    Hypertension Mother    Kidney disease Mother    Hypertension Sister    Stroke Paternal Grandmother    Heart attack Maternal Grandmother       Prior to Admission medications   Medication Sig Start Date End Date Taking? Authorizing Provider  diazepam (VALIUM) 5 MG tablet Take thirty minutes before MRI for anxiety. Patient taking differently: Take 5 mg by mouth daily as needed (Take thirty  minutes before MRI for anxiety). 09/12/22   Volanda Napoleon, MD  famciclovir (FAMVIR) 500 MG tablet Take 1 tablet (500 mg total) by mouth daily. 10/11/22   Volanda Napoleon, MD  fluconazole (DIFLUCAN) 100 MG tablet Take 100 mg by mouth daily. 07/28/22   [provider]  fluticasone (FLONASE) 50 MCG/ACT nasal spray Place 1 spray into both nostrils daily. Patient not taking: Reported on 10/17/2022 07/26/22   [provider]  lactulose (CHRONULAC) 10 GM/15ML solution TAKE 15 MLS (10 G TOTAL) BY MOUTH  2 (TWO) TIMES DAILY. 10/15/22   Volanda Napoleon, MD  levothyroxine (SYNTHROID) 88 MCG tablet Take 88 mcg by mouth daily before breakfast.    [provider]  metoprolol tartrate (LOPRESSOR) 25 MG tablet Take 1 tablet (25 mg total) by mouth 2 (two) times daily. Patient not taking: Reported on 10/17/2022 06/26/22   Modena Jansky, MD  Multiple Vitamin (MULTIVITAMIN WITH MINERALS) TABS tablet Take 1 tablet by mouth daily. 06/27/22   Hongalgi, Lenis Dickinson, MD  OLANZapine (ZYPREXA) 5 MG tablet Take 1 tablet (5 mg total) by mouth at bedtime. 10/14/22   Volanda Napoleon, MD  ondansetron (ZOFRAN) 8 MG tablet Take 1 tablet (8 mg total) by mouth every 8 (eight) hours as needed for nausea or vomiting. 10/10/22   Volanda Napoleon, MD  Oxycodone HCl 10 MG TABS Take 1 tablet (10 mg total) by mouth every 4 (four) hours as needed. Patient taking differently: Take 10 mg by mouth every 4 (four) hours as needed (for pain). 09/13/22   Volanda Napoleon, MD  pantoprazole (PROTONIX) 40 MG tablet Take 1 tablet (40 mg total) by mouth 2 (two) times daily. 10/04/22   Volanda Napoleon, MD  prochlorperazine (COMPAZINE) 10 MG tablet Take 1 tablet (10 mg total) by mouth every 6 (six) hours as needed for nausea or vomiting. 06/14/22   Tyler Pita, MD  rosuvastatin (CRESTOR) 10 MG tablet Take 10 mg by mouth every evening.    [provider]  senna (SENOKOT) 8.6 MG TABS tablet Take 1 tablet (8.6 mg total) by mouth daily as needed for mild constipation or moderate constipation. 06/26/22   Modena Jansky, MD    Physical Exam: BP 127/73   Pulse (!) 132   Temp 98 F (36.7 C) (Oral)   Resp 15   Wt 74.8 kg   SpO2 93%   BMI 28.10 kg/m   General: 61 y.o. year-old female well developed well nourished in no acute distress.  Alert and oriented x3. Cardiovascular: Tachycardic with no rubs or gallops.  No thyromegaly or JVD noted.  No lower extremity edema. 2/4 pulses in all 4 extremities. Respiratory: Clear to  auscultation with no wheezes or rales. Good inspiratory effort. Abdomen: Soft nontender nondistended with normal bowel sounds x4 quadrants. Muskuloskeletal: No cyanosis, clubbing or edema noted bilaterally Neuro: CN II-XII intact, strength, sensation, reflexes Skin: No ulcerative lesions noted or rashes Psychiatry: Judgement and insight appear normal. Mood is appropriate for condition and setting          Labs on Admission:  Basic Metabolic Panel: Recent Labs  Lab 10/11/22 0848 10/17/22 1712 10/17/22 2202  NA 136 137  --   K 3.7 5.1  --   CL 96* 100  --   CO2 28 22  --   GLUCOSE 129* 103*  --   BUN 15 51*  --   CREATININE 2.22* 7.72* 7.16*  CALCIUM 10.8* 10.2  --  Liver Function Tests: Recent Labs  Lab 10/11/22 0848  AST 27  ALT 10  ALKPHOS 96  BILITOT 0.5  PROT 7.0  ALBUMIN 4.0   No results for input(s): "LIPASE", "AMYLASE" in the last 168 hours. No results for input(s): "AMMONIA" in the last 168 hours. CBC: Recent Labs  Lab 10/11/22 0848 10/17/22 1712 10/17/22 2202  WBC 8.3 8.4 7.6  NEUTROABS 3.5  --   --   HGB 10.7* 10.0* 9.3*  HCT 33.5* 30.5* 28.9*  MCV 85.5 87.4 87.0  PLT 246 219 200   Cardiac Enzymes: No results for input(s): "CKTOTAL", "CKMB", "CKMBINDEX", "TROPONINI" in the last 168 hours.  BNP (last 3 results) No results for input(s): "BNP" in the last 8760 hours.  ProBNP (last 3 results) No results for input(s): "PROBNP" in the last 8760 hours.  CBG: No results for input(s): "GLUCAP" in the last 168 hours.  Radiological Exams on Admission: DG Humerus Left  Result Date: 10/17/2022 CLINICAL DATA:  History of multiple myeloma. Right arm pain after injury. EXAM: LEFT HUMERUS - 2+ VIEW COMPARISON:  None Available. FINDINGS: Mildly displaced fracture is seen involving the proximal left humeral shaft with irregular lucency in the area most consistent with pathologic fracture. IMPRESSION: Mildly displaced pathologic fracture is seen involving the  midshaft of the left humerus. Electronically Signed   By: Marijo Conception M.D.   On: 10/17/2022 13:25   DG Chest 2 View  Result Date: 10/17/2022 CLINICAL DATA:  Pain over left posterior chest wall. Multiple myeloma. EXAM: CHEST - 2 VIEW COMPARISON:  Chest 10/02/2022 FINDINGS: Heart size normal.  Negative for heart failure or edema. Interval removal of right basilar chest tube since the prior study. No pneumothorax. Right pleural effusion is small but has progressed from the prior study. Pleural based density right upper lobe appears slightly larger and may be pleural fluid or mass lesion. Progressive right lower lobe airspace disease. Myeloma lesions in the ribs on the right are better seen on prior CT. Port-A-Cath tip in the lower SVC unchanged. IMPRESSION: 1. Interval removal of right basilar chest tube. No pneumothorax. 2. Right pleural effusion has progressed. Pleural based density right upper lobe appears slightly larger. 3. Progressive right lower lobe airspace disease. Electronically Signed   By: Franchot Gallo M.D.   On: 10/17/2022 10:32    EKG: I independently viewed the EKG done and my findings are as followed: Sinus tachycardia rate of 140.  Non specific ST-T changes.  QTc 496.  Assessment/Plan Present on Admission:  AKI (acute kidney injury) ([place])  Principal Problem:   AKI (acute kidney injury) ([place])  AKI on CKD 3B, suspect prerenal in the setting of dehydration from poor oral intake. Presented with creatinine of 7.72 from 1.53 Avoid nephrotoxic agents, dehydration and hypotension Started normal saline at 50 cc/h x 2 days. Obtain renal ultrasound Closely monitor urine output with strict I's and O's Repeat renal function test in the morning  Left humeral fracture Currently in a splint Continue conservative care Pain control and bowel regimen in place  Sinus tachycardia Rate in the 130s -140s Resume home Lopressor Monitor on telemetry.  Hypothyroidism Resume home  levothyroxine  Hyperlipidemia Resume home Crestor.   DVT prophylaxis: Heparin subcu 3 times daily  Code Status: Full code.  Family Communication: 2 daughters at bedside.  Disposition Plan: Admitted to telemetry unit.  Consults called: None.  Admission status: Inpatient status.   Status is: Inpatient The patient requires at least 2 midnights for further evaluation  and treatment of present condition.   Kayleen Memos MD [facility] Pager [phone]  If 7PM-7AM, please contact night-coverage [email or web address] Password [facility]  10/18/2022, 2:34 AM

## 2022-10-18 ENCOUNTER — Inpatient Hospital Stay: Payer: BC Managed Care – PPO

## 2022-10-18 ENCOUNTER — Inpatient Hospital Stay (HOSPITAL_COMMUNITY): Payer: BC Managed Care – PPO

## 2022-10-18 ENCOUNTER — Encounter: Payer: Self-pay | Admitting: *Deleted

## 2022-10-18 ENCOUNTER — Encounter (HOSPITAL_COMMUNITY): Payer: Self-pay | Admitting: Internal Medicine

## 2022-10-18 ENCOUNTER — Inpatient Hospital Stay: Payer: BC Managed Care – PPO | Admitting: Hematology & Oncology

## 2022-10-18 DIAGNOSIS — E860 Dehydration: Secondary | ICD-10-CM

## 2022-10-18 DIAGNOSIS — M84522A Pathological fracture in neoplastic disease, left humerus, initial encounter for fracture: Secondary | ICD-10-CM

## 2022-10-18 DIAGNOSIS — D464 Refractory anemia, unspecified: Secondary | ICD-10-CM | POA: Diagnosis not present

## 2022-10-18 DIAGNOSIS — N289 Disorder of kidney and ureter, unspecified: Secondary | ICD-10-CM | POA: Diagnosis not present

## 2022-10-18 DIAGNOSIS — N179 Acute kidney failure, unspecified: Secondary | ICD-10-CM | POA: Diagnosis not present

## 2022-10-18 DIAGNOSIS — C9000 Multiple myeloma not having achieved remission: Secondary | ICD-10-CM | POA: Diagnosis not present

## 2022-10-18 LAB — BASIC METABOLIC PANEL
Anion gap: 11 (ref 5–15)
BUN: 52 mg/dL — ABNORMAL HIGH (ref 8–23)
CO2: 21 mmol/L — ABNORMAL LOW (ref 22–32)
Calcium: 9.4 mg/dL (ref 8.9–10.3)
Chloride: 108 mmol/L (ref 98–111)
Creatinine, Ser: 7.09 mg/dL — ABNORMAL HIGH (ref 0.44–1.00)
GFR, Estimated: 6 mL/min — ABNORMAL LOW (ref >60)
Glucose, Bld: 95 mg/dL (ref 70–99)
Potassium: 4.7 mmol/L (ref 3.5–5.1)
Sodium: 140 mmol/L (ref 135–145)

## 2022-10-18 MED ORDER — HYDROMORPHONE HCL 1 MG/ML IJ SOLN
1.0000 mg | INTRAMUSCULAR | Status: AC | PRN
  Administered 2022-10-18 (×2): 1 mg via INTRAVENOUS
  Filled 2022-10-18 (×2): qty 1

## 2022-10-18 MED ORDER — SODIUM CHLORIDE 0.9 % IV SOLN
20.0000 mg | Freq: Once | INTRAVENOUS | Status: AC
  Administered 2022-10-19: 20 mg via INTRAVENOUS
  Filled 2022-10-18: qty 2

## 2022-10-18 MED ORDER — METOPROLOL TARTRATE 25 MG PO TABS
12.5000 mg | ORAL_TABLET | Freq: Two times a day (BID) | ORAL | Status: DC
  Administered 2022-10-18 – 2022-10-31 (×28): 12.5 mg via ORAL
  Filled 2022-10-18 (×28): qty 1

## 2022-10-18 MED ORDER — ACETAMINOPHEN 325 MG PO TABS
650.0000 mg | ORAL_TABLET | Freq: Four times a day (QID) | ORAL | Status: DC | PRN
  Administered 2022-10-25 – 2022-10-30 (×7): 650 mg via ORAL
  Filled 2022-10-18 (×7): qty 2

## 2022-10-18 MED ORDER — PROCHLORPERAZINE EDISYLATE 10 MG/2ML IJ SOLN
5.0000 mg | Freq: Four times a day (QID) | INTRAMUSCULAR | Status: DC | PRN
  Administered 2022-10-18 – 2022-10-30 (×3): 5 mg via INTRAVENOUS
  Filled 2022-10-18 (×3): qty 2

## 2022-10-18 MED ORDER — SODIUM CHLORIDE 0.9 % IV SOLN: INTRAVENOUS | Status: AC

## 2022-10-18 MED ORDER — INFLUENZA VAC SPLIT QUAD 0.5 ML IM SUSY: 0.5000 mL | PREFILLED_SYRINGE | INTRAMUSCULAR | Status: DC | PRN

## 2022-10-18 MED ORDER — OXYCODONE HCL 5 MG PO TABS
5.0000 mg | ORAL_TABLET | Freq: Four times a day (QID) | ORAL | Status: AC | PRN
  Administered 2022-10-18 – 2022-10-19 (×4): 5 mg via ORAL
  Filled 2022-10-18 (×4): qty 1

## 2022-10-18 MED ORDER — HYDROMORPHONE HCL 1 MG/ML IJ SOLN
1.0000 mg | INTRAMUSCULAR | Status: AC
  Administered 2022-10-18: 1 mg via INTRAVENOUS
  Filled 2022-10-18: qty 1

## 2022-10-18 MED ORDER — PNEUMOCOCCAL VAC POLYVALENT 25 MCG/0.5ML IJ INJ: 0.5000 mL | INJECTION | INTRAMUSCULAR | Status: DC | PRN

## 2022-10-18 MED ORDER — SODIUM CHLORIDE 0.9 % IV SOLN
8.0000 mg | Freq: Three times a day (TID) | INTRAVENOUS | Status: DC
  Administered 2022-10-18 – 2022-10-21 (×9): 8 mg via INTRAVENOUS
  Filled 2022-10-18 (×14): qty 4

## 2022-10-18 MED ORDER — DEXTROSE 5 % IV SOLN
20.0000 mg/m2 | Freq: Once | INTRAVENOUS | Status: AC
  Administered 2022-10-19: 40 mg via INTRAVENOUS
  Filled 2022-10-18: qty 15

## 2022-10-18 MED ORDER — CHLORHEXIDINE GLUCONATE CLOTH 2 % EX PADS
6.0000 | MEDICATED_PAD | Freq: Every day | CUTANEOUS | Status: DC
  Administered 2022-10-18 – 2022-10-30 (×13): 6 via TOPICAL

## 2022-10-18 MED ORDER — SODIUM CHLORIDE 0.9 % IV SOLN: Freq: Three times a day (TID) | INTRAVENOUS | Status: DC

## 2022-10-18 MED ORDER — MELATONIN 3 MG PO TABS
3.0000 mg | ORAL_TABLET | Freq: Every evening | ORAL | Status: DC | PRN
  Administered 2022-10-27 – 2022-10-28 (×2): 3 mg via ORAL
  Filled 2022-10-18 (×2): qty 1

## 2022-10-18 MED ORDER — SODIUM CHLORIDE 0.9 % IV SOLN
Freq: Three times a day (TID) | INTRAVENOUS | Status: DC
  Administered 2022-10-18 (×2): 8 mg via INTRAVENOUS
  Filled 2022-10-18 (×3): qty 4

## 2022-10-18 MED ORDER — SODIUM CHLORIDE 0.9 % IV SOLN
400.0000 mg | Freq: Once | INTRAVENOUS | Status: AC
  Administered 2022-10-19: 400 mg via INTRAVENOUS
  Filled 2022-10-18: qty 20

## 2022-10-18 NOTE — Consult Note (Signed)
Renal Service Consult Note [facility]  Michelle [name] 10/18/2022 Sol Blazing, MD Requesting Physician: Dr. Karleen Hampshire  Reason for Consult: Renal failure HPI: The patient is a 61 y.o. year-old w/ PMH as below who presented to ED yesterday w/ L arm pain. She was having episodic nausea/ vomiting at home and was bracing herself w/ her L arm when she felt acute pain the L arm. She went to radiation oncology (hx cancer) and xrays were done which showed a humerus fracture and pt was sent to ED. In ED w/u confirmed L humerus fracture, sinus tachycardia. Labs showed creat 7.7 (baseline 1.5- 2.2). Pt admitted for AKI and we are asked to see for renal failure.    Per chart pt has a IgG kappa multiple myeloma. Her chemo was changed last week to Faspro/ Cytoxan/ kyprolis. Getting XRT also due to bony lesions. Per ONC notes the LC's have gone up quite quickly, last week the Kappa LC was 3100 mg/L.  ChemoRx will have to be started as inpatient, they will shoot for today or tomorrow.   Pt seen in room. No SOB, cough. No UOP recorded since admission.     ROS - denies CP, no joint pain, no HA, no blurry vision, no rash, no diarrhea, no nausea/ vomiting, no dysuria, no difficulty voiding   Past Medical History  Past Medical History:  Diagnosis Date   Anemia of chronic renal failure, stage 3b ([place]) 08/02/2022   Cancer ([place])    Essential hypertension 02/16/2017   Hyperlipemia    Hyperlipidemia 02/16/2017   Hypertension    Hypothyroidism 02/16/2017   Morbid obesity ([place]) 02/16/2017   Multiple myeloma without remission ([place]) 06/20/2022   Past Surgical History  Past Surgical History:  Procedure Laterality Date   IR IMAGING GUIDED PORT INSERTION  06/17/2022   IR THORACENTESIS ASP PLEURAL SPACE W/IMG GUIDE  06/17/2022   THYROIDECTOMY     Family History  Family History  Problem Relation Age of Onset   Diabetes Paternal Grandfather    Stroke Paternal Grandfather    Heart attack Paternal  Grandfather    Diabetes Father    Hypertension Father    Stroke Father    Lung cancer Father    Hypertension Mother    Kidney disease Mother    Hypertension Sister    Stroke Paternal Grandmother    Heart attack Maternal Grandmother    Social History  reports that she quit smoking about 33 years ago. Her smoking use included cigarettes. She has a 7.50 pack-year smoking history. She has never used smokeless tobacco. She reports that she does not drink alcohol and does not use drugs. Allergies  Allergies  Allergen Reactions   Penicillins Other (See Comments)    Severe Yeast infection... No deathly reactions   Codeine Nausea Only   Morphine Other (See Comments)    headache   Tramadol Other (See Comments)    Headaches    Home medications Prior to Admission medications   Medication Sig Start Date End Date Taking? Authorizing Provider  famciclovir (FAMVIR) 500 MG tablet Take 1 tablet (500 mg total) by mouth daily. 10/11/22  Yes Volanda Napoleon, MD  fluconazole (DIFLUCAN) 100 MG tablet Take 100 mg by mouth daily. 07/28/22  Yes [provider]  lactulose (CHRONULAC) 10 GM/15ML solution TAKE 15 MLS (10 G TOTAL) BY MOUTH 2 (TWO) TIMES DAILY. 10/15/22  Yes Volanda Napoleon, MD  levothyroxine (SYNTHROID) 88 MCG tablet Take 88 mcg by mouth daily before  breakfast.   Yes [provider]  Multiple Vitamin (MULTIVITAMIN WITH MINERALS) TABS tablet Take 1 tablet by mouth daily. 06/27/22  Yes Hongalgi, Lenis Dickinson, MD  ondansetron (ZOFRAN) 8 MG tablet Take 1 tablet (8 mg total) by mouth every 8 (eight) hours as needed for nausea or vomiting. 10/10/22  Yes Ennever, Rudell Cobb, MD  Oxycodone HCl 10 MG TABS Take 1 tablet (10 mg total) by mouth every 4 (four) hours as needed. Patient taking differently: Take 10 mg by mouth every 4 (four) hours as needed (for pain). 09/13/22  Yes Volanda Napoleon, MD  pantoprazole (PROTONIX) 40 MG tablet Take 1 tablet (40 mg total) by mouth 2 (two) times daily.  10/04/22  Yes Volanda Napoleon, MD  prochlorperazine (COMPAZINE) 10 MG tablet Take 1 tablet (10 mg total) by mouth every 6 (six) hours as needed for nausea or vomiting. 06/14/22  Yes Tyler Pita, MD  rosuvastatin (CRESTOR) 10 MG tablet Take 10 mg by mouth every evening.   Yes [provider]  senna (SENOKOT) 8.6 MG TABS tablet Take 1 tablet (8.6 mg total) by mouth daily as needed for mild constipation or moderate constipation. 06/26/22  Yes Hongalgi, Lenis Dickinson, MD  diazepam (VALIUM) 5 MG tablet Take thirty minutes before MRI for anxiety. Patient taking differently: Take 5 mg by mouth daily as needed (Take thirty minutes before MRI for anxiety). 09/12/22   Volanda Napoleon, MD  metoprolol tartrate (LOPRESSOR) 25 MG tablet Take 1 tablet (25 mg total) by mouth 2 (two) times daily. Patient not taking: Reported on 10/18/2022 06/26/22   Modena Jansky, MD  OLANZapine (ZYPREXA) 5 MG tablet Take 1 tablet (5 mg total) by mouth at bedtime. Patient not taking: Reported on 10/18/2022 10/14/22   Volanda Napoleon, MD     Vitals:   10/18/22 0200 10/18/22 0244 10/18/22 0558 10/18/22 0913  BP: 127/73 134/74 129/80 133/82  Pulse: (!) 132 (!) 137 (!) 113 (!) 119  Resp: _0 Temp:  98.5 F (36.9 C) 98 F (36.7 C) 98.6 F (37 C)  TempSrc:  Oral Oral Oral  SpO2: 93% 98% 98% 97%  Weight:  78.2 kg     Exam Gen alert, no distress No rash, cyanosis or gangrene Sclera anicteric, throat clear and a bit dry No jvd or bruits Chest clear bilat to bases, no rales/ wheezing RRR no MRG Abd soft ntnd no mass or ascites +bs GU defer MS LUE ace wrapped Ext no LE or UE edema, no wounds or ulcers Neuro is alert, Ox 3 , nf   Home meds include - synthroid, crestor, metoprolol 25 bid, prns      Date   Creat  eGFR    2009- 2011  0.75- 1.05    2016   1.32    June 2023  1.04 Jun 2022  2.03 - 2.73   Sept 13-29  2.26 >> 1.93  24- 29 ml/min     Oct 2023  1.62       Nov 3- 30, 2023 1.25- 1.53 38-  48 ml/min    10/11/22  2.22      12/14  7.72    10/18/22  7.09      UA 12/14 - mod LE, neg prot, 0-5 rbc/ epi, 11-20 wbc   B/l creat 1.53- 2.26 from sept - nov 2023, eGFR 29- 38 ml/min    BP's in ED normal at 140/80 range, HR's high 133 >>  112- 119 now.  RR 18-20.  RA 98%.    Assessment/ Plan: AKI on CKD 3b - b/l creat 1.53- 2.26 from sept - nov 2023, eGFR 29- 38 ml/min. Creat here is 7.72 on admission in setting of aggressive multiple myeloma. UA is negative, will get renal US. Suspect AKI / CKD due to myeloma kidney, also hopefully there is a volume component. Has mild asterixis which is likely early uremia. Watch closely, no strong indication for dialysis yet. Plan place foley, urine lytes and IVF's. F/u labs in am.  L humerus fracture - per pmd Multiple myeloma - diagnosed June 2023 w/ extensive spinal disease. Recent admit showed malignant R pleural effusion. Has had radiation therapy and chemoRx. F/b Dr Marin Olp. Sinus tach - may be dry, she has been vomiting/ nauseous at home.   Hyopthyroidism      Rob Annete Ayuso  MD 10/18/2022, 11:56 AM Recent Labs  Lab 10/17/22 1712 10/17/22 2202 10/18/22 0849  HGB 10.0* 9.3*  --   CALCIUM 10.2  --  9.4  CREATININE 7.72* 7.16* 7.09*  K 5.1  --  4.7   Inpatient medications:  heparin  5,000 Units Subcutaneous Q8H   levothyroxine  88 mcg Oral QAC breakfast   metoprolol tartrate  12.5 mg Oral BID    sodium chloride 50 mL/hr at 10/18/22 0404   ondansetron (ZOFRAN) 8 mg, dexamethasone (DECADRON) 10 mg in sodium chloride 0.9 % 50 mL IVPB 8 mg (10/18/22 1108)   acetaminophen, HYDROmorphone (DILAUDID) injection, influenza vac split quadrivalent PF, melatonin, oxyCODONE, pneumococcal 23 valent vaccine, prochlorperazine

## 2022-10-18 NOTE — Progress Notes (Signed)
Chaplain was unable to meet with Michelle [name] regarding her advance directives.  We do not typically assist with advance directives over the weekend.  Chaplain will attempt on Monday, but please page if there are urgent support needs.  [address], [place] Pager, [phone]

## 2022-10-18 NOTE — Progress Notes (Signed)
[facility]                                                                               Michelle [name], is a 61 y.o. female, DOB - [birth year], BZJ:[id number] Admit date - 10/17/2022    Outpatient Primary MD for the patient is Leeroy Cha, MD  LOS - 1  days    Brief summary    Michelle [name] is a 61 y.o. female with medical history significant for CKD 3B, multiple myeloma and malignant pleural effusion status post talc pleurodesis, status post radiation therapy to the skull base, treated at cancer center, who initially presented to radiation oncology at the cancer center with complaints of generalized weakness, left arm pain, and vomiting. . X-rays were done. The patient was informed she had a humerus fracture and was advised to go to the ED for further management.  In the ED, workup revealed left humerus fracture, AKI and sinus tachycardia.  EDP discussed the case with orthopedic surgery, Dr. Marlou Sa.  Orthopedic surgery recommended outpatient follow-up on Monday 10/18/2022.  No plan for surgical intervention at this time.  The patient prefers to be admitted at [facility] rather than [facility].  Admitted by [facility], hospitalist service.    Assessment & Plan    Assessment and Plan:   AKI;  Superimposed on Stage 3 b CKD in the setting of MM and poor intake.  Probably secondary to a combination of dehydration and hypotension.  US renal ordered and pending. Creatinine on admission at 7.7, baseline creatinine at 2.2. gentle hydrated and repeat renal parameters.  Continue with strict intake and output.   Multiple Myeloma:  Diagnosed in June 2023 with extensive spinal disease.     Malignant right pleural effusion:  CXR shows  Right pleural effusion has progressed. Pleural based density right upper lobe appears slightly larger.   Let humerus fracture:  - continue with splint.  Pain control.    Sinus tachycardia:  - continue with lopressor.     Hypothyroidism; Resume synthroid.    Hyperlipidemia:  Resume crestor.    Anemia of chronic disease: - hemoglobin between 9 to 10.    Estimated body mass index is 29.36 kg/m as calculated from the following:   Height as of an earlier encounter on 10/17/22: 5' 4.25" (1.632 m).   Weight as of this encounter: 78.2 kg.  Code Status: full code.  DVT Prophylaxis:  heparin injection 5,000 Units Start: 10/17/22 2200   Level of Care: Level of care: Telemetry Family Communication: daughter at bedside.   Disposition Plan:     Remains inpatient appropriate:    Procedures:  US renal.   Consultants:   Nephrology Oncology.   Antimicrobials:   Anti-infectives (From admission, onward)    None        Medications  Scheduled Meds:  [START ON 10/19/2022] carfilzomib  20 mg/m2 (Treatment Plan Recorded) Intravenous Once   [START ON 10/19/2022] cyclophosphamide  400 mg Intravenous Once   heparin  5,000 Units Subcutaneous Q8H   levothyroxine  88 mcg Oral QAC breakfast   metoprolol tartrate  12.5 mg Oral BID   Continuous Infusions:  sodium chloride 50 mL/hr  at 10/18/22 0404   [START ON 10/19/2022] dexamethasone (DECADRON) IVPB ([facility])     ondansetron (ZOFRAN) 8 mg, dexamethasone (DECADRON) 10 mg in sodium chloride 0.9 % 50 mL IVPB 8 mg (10/18/22 1108)   PRN Meds:.acetaminophen, HYDROmorphone (DILAUDID) injection, influenza vac split quadrivalent PF, melatonin, oxyCODONE, pneumococcal 23 valent vaccine, prochlorperazine    Subjective:   Michelle [name] was seen and examined today.  Slightly confused.   Objective:   Vitals:   10/18/22 0244 10/18/22 0558 10/18/22 0913 10/18/22 1310  BP: 134/74 129/80 133/82 128/74  Pulse: (!) 137 (!) 113 (!) 119 (!) 105  Resp: _0 Temp: 98.5 F (36.9 C) 98 F (36.7 C) 98.6 F (37 C) 98.7 F (37.1 C)  TempSrc: Oral Oral Oral Oral  SpO2: 98% 98% 97% 97%  Weight: 78.2 kg       Intake/Output Summary (Last 24 hours) at  10/18/2022 1422 Last data filed at 10/18/2022 1310 Gross per 24 hour  Intake 1041.67 ml  Output 700 ml  Net 341.67 ml   Filed Weights   10/17/22 1631 10/18/22 0244  Weight: 74.8 kg 78.2 kg     Exam General exam: Appears calm and comfortable  Respiratory system: Clear to auscultation. Respiratory effort normal. Cardiovascular system: S1 & S2 heard, RRR. No JVD, No pedal edema. Gastrointestinal system: Abdomen is nondistended, soft and nontender.  Normal bowel sounds heard. Central nervous system: Alert and oriented to place and person.  Extremities: Symmetric 5 x 5 power. Skin: No rashes,  Psychiatry: Mood & affect appropriate.     Data Reviewed:  I have personally reviewed following labs and imaging studies   CBC Lab Results  Component Value Date   WBC 7.6 10/17/2022   RBC 3.32 (L) 10/17/2022   HGB 9.3 (L) 10/17/2022   HCT 28.9 (L) 10/17/2022   MCV 87.0 10/17/2022   MCH 28.0 10/17/2022   PLT 200 10/17/2022   MCHC 32.2 10/17/2022   RDW 15.8 (H) 10/17/2022   LYMPHSABS 1.2 10/11/2022   MONOABS 3.5 (H) 10/11/2022   EOSABS 0.1 10/11/2022   BASOSABS 0.0 03/54/6568     Last metabolic panel Lab Results  Component Value Date   NA 140 10/18/2022   K 4.7 10/18/2022   CL 108 10/18/2022   CO2 21 (L) 10/18/2022   BUN 52 (H) 10/18/2022   CREATININE 7.09 (H) 10/18/2022   GLUCOSE 95 10/18/2022   GFRNONAA 6 (L) 10/18/2022   GFRAA 51 (L) 07/12/2015   CALCIUM 9.4 10/18/2022   PROT 7.0 10/11/2022   ALBUMIN 4.0 10/11/2022   LABGLOB 2.9 10/11/2022   AGRATIO 1.1 10/11/2022   BILITOT 0.5 10/11/2022   ALKPHOS 96 10/11/2022   AST 27 10/11/2022   ALT 10 10/11/2022   ANIONGAP 11 10/18/2022    CBG (last 3)  No results for input(s): "GLUCAP" in the last 72 hours.    Coagulation Profile: No results for input(s): "INR", "PROTIME" in the last 168 hours.   Radiology Studies: DG Humerus Left  Result Date: 10/17/2022 CLINICAL DATA:  History of multiple myeloma. Right arm  pain after injury. EXAM: LEFT HUMERUS - 2+ VIEW COMPARISON:  None Available. FINDINGS: Mildly displaced fracture is seen involving the proximal left humeral shaft with irregular lucency in the area most consistent with pathologic fracture. IMPRESSION: Mildly displaced pathologic fracture is seen involving the midshaft of the left humerus. Electronically Signed   By: Marijo Conception M.D.   On: 10/17/2022 13:25   DG Chest 2 View  Result Date: 10/17/2022 CLINICAL DATA:  Pain over left posterior chest wall. Multiple myeloma. EXAM: CHEST - 2 VIEW COMPARISON:  Chest 10/02/2022 FINDINGS: Heart size normal.  Negative for heart failure or edema. Interval removal of right basilar chest tube since the prior study. No pneumothorax. Right pleural effusion is small but has progressed from the prior study. Pleural based density right upper lobe appears slightly larger and may be pleural fluid or mass lesion. Progressive right lower lobe airspace disease. Myeloma lesions in the ribs on the right are better seen on prior CT. Port-A-Cath tip in the lower SVC unchanged. IMPRESSION: 1. Interval removal of right basilar chest tube. No pneumothorax. 2. Right pleural effusion has progressed. Pleural based density right upper lobe appears slightly larger. 3. Progressive right lower lobe airspace disease. Electronically Signed   By: Franchot Gallo M.D.   On: 10/17/2022 10:32       Hosie Poisson M.D. [facility] 10/18/2022, 2:22 PM  Available via Epic secure chat 7am-7pm After 7 pm, please refer to night coverage provider listed on amion.

## 2022-10-18 NOTE — Progress Notes (Signed)
Patient was scheduled to come in today to start new regimen. She was admitted to the hospital yesterday with renal failure and new pathologic fracture in her arm. She will start her new treatment inpatient.   Will continue to follow for post discharge needs and office follow up.   Oncology Nurse Navigator Documentation     10/18/2022    8:00 AM  Oncology Nurse Navigator Flowsheets  Navigator Follow Up Date: 10/25/2022  Navigator Follow Up Reason: Appointment Review  Navigator Location [facility]  Navigator Encounter Type Appt/Treatment Plan Review  Patient Visit Type MedOnc  Treatment Phase Active Tx  Barriers/Navigation Needs Coordination of Care;Education  Interventions None Required  Acuity Level 2-Minimal Needs (1-2 Barriers Identified)  Support Groups/Services Friends and Family  Time Spent with Patient 15

## 2022-10-18 NOTE — Progress Notes (Signed)
Per MD treat with elevated Scr.

## 2022-10-18 NOTE — Consult Note (Signed)
Michelle [name] is well-known to me.  She is a very nice 61 year old Afro-American female.  She clearly has a very difficult myeloma.  She has IgG kappa myeloma.  She does have some adverse FISH studies.  The main problem has been the light chains.  The light chains have gone up quite quickly.  We changed her therapy a week ago and started her on Faspro/Cytoxan/Kyprolis.  She is getting radiation therapy because of bony lesions.  She was getting radiation therapy to the brain because of the skull base lesions.  I think she had her last treatment couple days ago.  She called the office yesterday saying that when she was getting dressed, she heard a "crunch" in her left upper arm.  She subsequently went to the emergency room.  An x-ray showed that she had a mildly displaced pathologic fracture in the mid shaft.  Orthopedic surgery did not feel that this needs to be fixed immediately.  Again worse as the fact that she, now has profound renal failure.  Her BUN was 51 creatinine 7.72.  6 days ago her BUN was 15 creatinine 2.22.  I think the real problem has been the rapid rise of her Kappa light chain.  Last week the Kappa light chain was 3100 mg/L.  I am sure that this is the reason for her renal insufficiency.  She was subsequently admitted.  Her white cell count is 7.6.  Hemoglobin 9.3.  Platelet count 200,000.  She is having some discomfort.  I totally understand this.  Her left arm is in a sling right now.  She is going to clearly need to get chemotherapy as an inpatient.  She was doing to have treatment today in our office.  I will have to see if we cannot arrange for her to have treatment as an inpatient with Cytoxan/Kyprolis.  We have to get the light chains down.  This is what is causing a lot of her problems.  She has a very low tolerance to medications.  She has had a lot of problems with nausea and vomiting.  I think Zofran is probably what she tolerates best.  She is going to have a renal  ultrasound done today.  Again, I suspect that this will show that she may have enlarged kidneys secondary to myelomatous involvement.  When I first saw her this summer, our goal was to try to get her to transplant.  She had an initial good response to treatment and then her level started going back up again.  Again, it is her Kappa light chain's that are causing the problems for [place].  Her vital signs are temperature of 98.  Pulse 113.  Blood pressure 129/80.  Her lungs sound clear bilaterally.  Cardiac exam regular rate and rhythm.  Abdomen is soft.  Bowel sounds are present.  She has no guarding or rebound tenderness.  Extremities shows a wrapping on the left humerus.  She has a sling on the left arm.  Neurological exam is none focal.  This is a very difficult situation.  Michelle [name] has a myeloma that is clearly aggressive  Again we will going to have to try to get treatment going in the hospital again.  I will see if we cannot do treatment today or tomorrow at the latest.  She will get Cytoxan/Kyprolis.  Will make sure she is on a scheduled Zofran dose.  She is making urine I think.  We will have to monitor her renal output.  She  needs to have her Port-A-Cath accessed.  This is quite tenuous.  Hopefully, she will not have to go over to [facility] for dialysis.  I do appreciate the wonderful care that I know she will get from everybody on 6 E.  Michelle Haw, MD  Michelle [name] 1:5

## 2022-10-19 ENCOUNTER — Inpatient Hospital Stay (HOSPITAL_COMMUNITY): Payer: BC Managed Care – PPO

## 2022-10-19 DIAGNOSIS — E860 Dehydration: Secondary | ICD-10-CM | POA: Diagnosis not present

## 2022-10-19 DIAGNOSIS — D464 Refractory anemia, unspecified: Secondary | ICD-10-CM | POA: Diagnosis not present

## 2022-10-19 DIAGNOSIS — N179 Acute kidney failure, unspecified: Secondary | ICD-10-CM | POA: Diagnosis not present

## 2022-10-19 DIAGNOSIS — C9000 Multiple myeloma not having achieved remission: Secondary | ICD-10-CM | POA: Diagnosis not present

## 2022-10-19 LAB — CBC
HCT: 26.5 % — ABNORMAL LOW (ref 36.0–46.0)
Hemoglobin: 8.4 g/dL — ABNORMAL LOW (ref 12.0–15.0)
MCH: 27.5 pg (ref 26.0–34.0)
MCHC: 31.7 g/dL (ref 30.0–36.0)
MCV: 86.9 fL (ref 80.0–100.0)
Platelets: 193 10*3/uL (ref 150–400)
RBC: 3.05 MIL/uL — ABNORMAL LOW (ref 3.87–5.11)
RDW: 16 % — ABNORMAL HIGH (ref 11.5–15.5)
WBC: 6.5 10*3/uL (ref 4.0–10.5)
nRBC: 0 % (ref 0.0–0.2)

## 2022-10-19 LAB — COMPREHENSIVE METABOLIC PANEL
ALT: 12 U/L (ref 0–44)
AST: 25 U/L (ref 15–41)
Albumin: 2.8 g/dL — ABNORMAL LOW (ref 3.5–5.0)
Alkaline Phosphatase: 67 U/L (ref 38–126)
Anion gap: 11 (ref 5–15)
BUN: 59 mg/dL — ABNORMAL HIGH (ref 8–23)
CO2: 20 mmol/L — ABNORMAL LOW (ref 22–32)
Calcium: 9.1 mg/dL (ref 8.9–10.3)
Chloride: 108 mmol/L (ref 98–111)
Creatinine, Ser: 6.96 mg/dL — ABNORMAL HIGH (ref 0.44–1.00)
GFR, Estimated: 6 mL/min — ABNORMAL LOW (ref >60)
Glucose, Bld: 147 mg/dL — ABNORMAL HIGH (ref 70–99)
Potassium: 5 mmol/L (ref 3.5–5.1)
Sodium: 139 mmol/L (ref 135–145)
Total Bilirubin: 0.6 mg/dL (ref 0.3–1.2)
Total Protein: 5.8 g/dL — ABNORMAL LOW (ref 6.5–8.1)

## 2022-10-19 LAB — MAGNESIUM: Magnesium: 2.7 mg/dL — ABNORMAL HIGH (ref 1.7–2.4)

## 2022-10-19 LAB — PHOSPHORUS: Phosphorus: 5.5 mg/dL — ABNORMAL HIGH (ref 2.5–4.6)

## 2022-10-19 MED ORDER — DRONABINOL 2.5 MG PO CAPS
2.5000 mg | ORAL_CAPSULE | Freq: Three times a day (TID) | ORAL | Status: DC
  Administered 2022-10-19 – 2022-10-20 (×6): 2.5 mg via ORAL
  Filled 2022-10-19 (×6): qty 1

## 2022-10-19 MED ORDER — MORPHINE SULFATE ER 15 MG PO TBCR
15.0000 mg | EXTENDED_RELEASE_TABLET | Freq: Two times a day (BID) | ORAL | Status: DC
  Administered 2022-10-19 – 2022-10-24 (×12): 15 mg via ORAL
  Filled 2022-10-19 (×12): qty 1

## 2022-10-19 NOTE — Progress Notes (Signed)
[facility]                                                                               Michelle [name], is a 61 y.o. female, DOB - 1961/06/15, OBS:[id number] Admit date - 10/17/2022    Outpatient Primary MD for the patient is Michelle Cha, MD  LOS - 2  days    Brief summary    Michelle [name] is a 61 y.o. female with medical history significant for CKD 3B, multiple myeloma and malignant pleural effusion status post talc pleurodesis, status post radiation therapy to the skull base, treated at cancer center, who initially presented to radiation oncology at the cancer center with complaints of generalized weakness, left arm pain, and vomiting. . X-rays were done. The patient was informed she had a humerus fracture and was advised to go to the ED for further management.  In the ED, workup revealed left humerus fracture, AKI and sinus tachycardia.  EDP discussed the case with orthopedic surgery, Dr. Marlou Sa.  Orthopedic surgery recommended outpatient follow-up on Monday 10/18/2022.  No plan for surgical intervention at this time.  The patient prefers to be admitted at [facility] long rather than [facility].  Admitted by [facility], hospitalist service.    Assessment & Plan    Assessment and Plan:   AKI;  Superimposed on Stage 3 b CKD in the setting of MM and poor intake.  Probably secondary to a combination of dehydration and hypotension.  US renal ordered and pending. Creatinine on admission at 7.7, baseline creatinine at 2.2. gentle hydrated and repeat renal parameters show only slight improvement to 6.96 . Her phos is 5.5  Continue with strict intake and output.  Nephrology on board and appreciate recommendations.   Multiple Myeloma:  She has light chain myeloma.  Diagnosed in June 2023 with extensive spinal disease.  Dr Marin Olp on board and she is scheduled for cytoxan and carfilzomib.     Malignant right pleural effusion:  CXR shows  Right pleural effusion has progressed.  Pleural based density right upper lobe appears slightly larger. She is on RA and oes not appear to be symptomatic from the pleural effusion.    Let humerus fracture:  - continue with splint.  Pain control with MS contin, along with oxycodone and dilaudid IV.  - physical therapy evaluation ordered.    Sinus tachycardia:  Much improved.  - continue with lopressor 12.5 mg BID. Marland Kitchen    Hypothyroidism; Resume synthroid 88 mcg daily.      Anemia of chronic disease: -hemoglobin dropped to 8.4.  Transfuse to keep hemoglobin greater than 7.    Estimated body mass index is 29.02 kg/m as calculated from the following:   Height as of an earlier encounter on 10/17/22: 5' 4.25" (1.632 m).   Weight as of this encounter: 77.3 kg.  Code Status: full code.  DVT Prophylaxis:  heparin injection 5,000 Units Start: 10/17/22 2200   Level of Care: Level of care: Telemetry Family Communication: daughter at bedside.   Disposition Plan:     Remains inpatient appropriate:  chemo and AKI.   Procedures:  US renal.   Consultants:   Nephrology Oncology.  Antimicrobials:   Anti-infectives (From admission, onward)    None        Medications  Scheduled Meds:  carfilzomib  20 mg/m2 (Treatment Plan Recorded) Intravenous Once   Chlorhexidine Gluconate Cloth  6 each Topical QHS   cyclophosphamide  400 mg Intravenous Once   dronabinol  2.5 mg Oral TID AC   heparin  5,000 Units Subcutaneous Q8H   levothyroxine  88 mcg Oral QAC breakfast   metoprolol tartrate  12.5 mg Oral BID   morphine  15 mg Oral Q12H   Continuous Infusions:  sodium chloride Stopped (10/19/22 0042)   dexamethasone (DECADRON) IVPB ([facility])     ondansetron (ZOFRAN) IV 8 mg (10/19/22 0520)   PRN Meds:.acetaminophen, influenza vac split quadrivalent PF, melatonin, oxyCODONE, pneumococcal 23 valent vaccine, prochlorperazine    Subjective:   Michelle [name] was seen and examined today.  Pain control.   Objective:    Vitals:   10/18/22 1332 10/18/22 2001 10/19/22 0507 10/19/22 0737  BP:  128/71 124/83 127/76  Pulse:  98 (!) 105 100  Resp:  _0 Temp:  98.2 F (36.8 C) 97.8 F (36.6 C) 98.9 F (37.2 C)  TempSrc:  Oral Oral Oral  SpO2:  98% 97% 97%  Weight: 77.3 kg       Intake/Output Summary (Last 24 hours) at 10/19/2022 0910 Last data filed at 10/19/2022 0650 Gross per 24 hour  Intake 1789.61 ml  Output 1550 ml  Net 239.61 ml    Filed Weights   10/17/22 1631 10/18/22 0244 10/18/22 1332  Weight: 74.8 kg 78.2 kg 77.3 kg     Exam General exam: Appears calm and comfortable  Respiratory system: Clear to auscultation. Respiratory effort normal. Cardiovascular system: S1 & S2 heard, RRR. No JVD, murmurs,  Gastrointestinal system: Abdomen is nondistended, soft and nontender.  Central nervous system: Alert and oriented. No focal neurological deficits. Extremities: Symmetric 5 x 5 power. Skin: No rashes, Psychiatry: Mood & affect appropriate.      Data Reviewed:  I have personally reviewed following labs and imaging studies   CBC Lab Results  Component Value Date   WBC 6.5 10/19/2022   RBC 3.05 (L) 10/19/2022   HGB 8.4 (L) 10/19/2022   HCT 26.5 (L) 10/19/2022   MCV 86.9 10/19/2022   MCH 27.5 10/19/2022   PLT 193 10/19/2022   MCHC 31.7 10/19/2022   RDW 16.0 (H) 10/19/2022   LYMPHSABS 1.2 10/11/2022   MONOABS 3.5 (H) 10/11/2022   EOSABS 0.1 10/11/2022   BASOSABS 0.0 82/95/6213     Last metabolic panel Lab Results  Component Value Date   NA 139 10/19/2022   K 5.0 10/19/2022   CL 108 10/19/2022   CO2 20 (L) 10/19/2022   BUN 59 (H) 10/19/2022   CREATININE 6.96 (H) 10/19/2022   GLUCOSE 147 (H) 10/19/2022   GFRNONAA 6 (L) 10/19/2022   GFRAA 51 (L) 07/12/2015   CALCIUM 9.1 10/19/2022   PHOS 5.5 (H) 10/19/2022   PROT 5.8 (L) 10/19/2022   ALBUMIN 2.8 (L) 10/19/2022   LABGLOB 2.9 10/11/2022   AGRATIO 1.1 10/11/2022   BILITOT 0.6 10/19/2022   ALKPHOS 67  10/19/2022   AST 25 10/19/2022   ALT 12 10/19/2022   ANIONGAP 11 10/19/2022    CBG (last 3)  No results for input(s): "GLUCAP" in the last 72 hours.    Coagulation Profile: No results for input(s): "INR", "PROTIME" in the last 168 hours.   Radiology Studies: DG Humerus Left  Result Date: 10/17/2022 CLINICAL DATA:  History of multiple myeloma. Right arm pain after injury. EXAM: LEFT HUMERUS - 2+ VIEW COMPARISON:  None Available. FINDINGS: Mildly displaced fracture is seen involving the proximal left humeral shaft with irregular lucency in the area most consistent with pathologic fracture. IMPRESSION: Mildly displaced pathologic fracture is seen involving the midshaft of the left humerus. Electronically Signed   By: Marijo Conception M.D.   On: 10/17/2022 13:25   DG Chest 2 View  Result Date: 10/17/2022 CLINICAL DATA:  Pain over left posterior chest wall. Multiple myeloma. EXAM: CHEST - 2 VIEW COMPARISON:  Chest 10/02/2022 FINDINGS: Heart size normal.  Negative for heart failure or edema. Interval removal of right basilar chest tube since the prior study. No pneumothorax. Right pleural effusion is small but has progressed from the prior study. Pleural based density right upper lobe appears slightly larger and may be pleural fluid or mass lesion. Progressive right lower lobe airspace disease. Myeloma lesions in the ribs on the right are better seen on prior CT. Port-A-Cath tip in the lower SVC unchanged. IMPRESSION: 1. Interval removal of right basilar chest tube. No pneumothorax. 2. Right pleural effusion has progressed. Pleural based density right upper lobe appears slightly larger. 3. Progressive right lower lobe airspace disease. Electronically Signed   By: Franchot Gallo M.D.   On: 10/17/2022 10:32       Hosie Poisson M.D. [facility] 10/19/2022, 9:10 AM  Available via Epic secure chat 7am-7pm After 7 pm, please refer to night coverage provider listed on amion.

## 2022-10-19 NOTE — Progress Notes (Signed)
[facility]                                                                               Michelle [name], is a 61 y.o. female, DOB - 1960/12/22, DUK:[id number] Admit date - 10/17/2022    Outpatient Primary MD for the patient is Leeroy Cha, MD  LOS - 2  days    Brief summary    Michelle [name] is a 61 y.o. female with medical history significant for CKD 3B, multiple myeloma and malignant pleural effusion status post talc pleurodesis, status post radiation therapy to the skull base, treated at cancer center, who initially presented to radiation oncology at the cancer center with complaints of generalized weakness, left arm pain, and vomiting. . X-rays were done. The patient was informed she had a humerus fracture and was advised to go to the ED for further management.  In the ED, workup revealed left humerus fracture, AKI and sinus tachycardia.  EDP discussed the case with orthopedic surgery, Dr. Marlou Sa.  Orthopedic surgery recommended outpatient follow-up on Monday 10/18/2022.  No plan for surgical intervention at this time.  The patient prefers to be admitted at [facility] rather than [facility].  Admitted by [facility], hospitalist service.    Assessment & Plan    Assessment and Plan:   AKI;  Superimposed on Stage 3 b CKD in the setting of MM and poor  oral intake.  Probably secondary to a combination of dehydration and hypotension and myeloma.  US renal ordered and pending. Creatinine on admission at 7.7, baseline creatinine at 2.2. gentle hydrated and repeat renal parameters show only slight improvement to 6.96 . Her phos is 5.5  Continue with strict intake and output.  Nephrology on board and appreciate recommendations.   Multiple Myeloma:  She has light chain myeloma.  Diagnosed in June 2023 with extensive spinal disease.  Dr Marin Olp on board and she is scheduled for cytoxan and carfilzomib.     Malignant right pleural effusion:  CXR shows  Right pleural  effusion has progressed. Pleural based density right upper lobe appears slightly larger. She is on RA and oes not appear to be symptomatic from the pleural effusion.    Let humerus fracture:  - continue with splint.  Pain control with MS contin, along with oxycodone and dilaudid IV.  - physical therapy evaluation ordered.    Sinus tachycardia:  Much improved.  - continue with lopressor 12.5 mg BID. Marland Kitchen    Hypothyroidism; Resume synthroid 88 mcg daily.      Anemia of chronic disease: -hemoglobin dropped to 8.4.  Transfuse to keep hemoglobin greater than 7.   In view of her aggressive MM, pathological fracture, AKI, palliative care consulted for goals of care.    Estimated body mass index is 29.02 kg/m as calculated from the following:   Height as of an earlier encounter on 10/17/22: 5' 4.25" (1.632 m).   Weight as of this encounter: 77.3 kg.  Code Status: full code.  DVT Prophylaxis:  heparin injection 5,000 Units Start: 10/17/22 2200   Level of Care: Level of care: Telemetry Family Communication: daughter at bedside.   Disposition Plan:  Remains inpatient appropriate:  chemo and AKI.   Procedures:  US renal.   Consultants:   Nephrology Oncology.   Antimicrobials:   Anti-infectives (From admission, onward)    None        Medications  Scheduled Meds:  Chlorhexidine Gluconate Cloth  6 each Topical QHS   cyclophosphamide  400 mg Intravenous Once   dronabinol  2.5 mg Oral TID AC   heparin  5,000 Units Subcutaneous Q8H   levothyroxine  88 mcg Oral QAC breakfast   metoprolol tartrate  12.5 mg Oral BID   morphine  15 mg Oral Q12H   Continuous Infusions:  sodium chloride 75 mL/hr at 10/19/22 1150   ondansetron (ZOFRAN) IV 8 mg (10/19/22 0520)   PRN Meds:.acetaminophen, influenza vac split quadrivalent PF, melatonin, oxyCODONE, pneumococcal 23 valent vaccine, prochlorperazine    Subjective:   Michelle [name] was seen and examined today.     Objective:   Vitals:   10/18/22 1332 10/18/22 2001 10/19/22 0507 10/19/22 0737  BP:  128/71 124/83 127/76  Pulse:  98 (!) 105 100  Resp:  _0 Temp:  98.2 F (36.8 C) 97.8 F (36.6 C) 98.9 F (37.2 C)  TempSrc:  Oral Oral Oral  SpO2:  98% 97% 97%  Weight: 77.3 kg       Intake/Output Summary (Last 24 hours) at 10/19/2022 1400 Last data filed at 10/19/2022 0900 Gross per 24 hour  Intake 1789.61 ml  Output 850 ml  Net 939.61 ml    Filed Weights   10/17/22 1631 10/18/22 0244 10/18/22 1332  Weight: 74.8 kg 78.2 kg 77.3 kg     Exam General exam: Appears calm and comfortable with asterixis Respiratory system: Clear to auscultation. Respiratory effort normal. Cardiovascular system: S1 & S2 heard, RRR. No JVD,  Gastrointestinal system: Abdomen is nondistended, soft and nontender.  Central nervous system: Alert and oriented to place and person, but appears confused. .  Asterixis.  Extremities:  left upper extremity in bandages.  Skin: No rashes,  Psychiatry: Mood & affect appropriate.       Data Reviewed:  I have personally reviewed following labs and imaging studies   CBC Lab Results  Component Value Date   WBC 6.5 10/19/2022   RBC 3.05 (L) 10/19/2022   HGB 8.4 (L) 10/19/2022   HCT 26.5 (L) 10/19/2022   MCV 86.9 10/19/2022   MCH 27.5 10/19/2022   PLT 193 10/19/2022   MCHC 31.7 10/19/2022   RDW 16.0 (H) 10/19/2022   LYMPHSABS 1.2 10/11/2022   MONOABS 3.5 (H) 10/11/2022   EOSABS 0.1 10/11/2022   BASOSABS 0.0 16/08/9603     Last metabolic panel Lab Results  Component Value Date   NA 139 10/19/2022   K 5.0 10/19/2022   CL 108 10/19/2022   CO2 20 (L) 10/19/2022   BUN 59 (H) 10/19/2022   CREATININE 6.96 (H) 10/19/2022   GLUCOSE 147 (H) 10/19/2022   GFRNONAA 6 (L) 10/19/2022   GFRAA 51 (L) 07/12/2015   CALCIUM 9.1 10/19/2022   PHOS 5.5 (H) 10/19/2022   PROT 5.8 (L) 10/19/2022   ALBUMIN 2.8 (L) 10/19/2022   LABGLOB 2.9 10/11/2022   AGRATIO  1.1 10/11/2022   BILITOT 0.6 10/19/2022   ALKPHOS 67 10/19/2022   AST 25 10/19/2022   ALT 12 10/19/2022   ANIONGAP 11 10/19/2022    CBG (last 3)  No results for input(s): "GLUCAP" in the last 72 hours.    Coagulation Profile: No results for input(s): "INR", "  PROTIME" in the last 168 hours.   Radiology Studies: No results found.       M.D. [facility] 10/19/2022, 2:00 PM  Available via Epic secure chat 7am-7pm After 7 pm, please refer to night coverage provider listed on amion.    

## 2022-10-19 NOTE — Progress Notes (Signed)
Ms. Bevilacqua may be doing a little bit better.  I do appreciate everybody's help.  She will have chemotherapy today.  She still is having pain with the left arm.  I will see about getting her on some long-acting pain medication to see if this may help a little bit.  She is not all that hungry.  I will try her on some Marinol to try to get her appetite a little bit better.  She has had no problems with nausea or vomiting.  We have her on scheduled Zofran.  She has had no fever.  She has had no bleeding.  There is been no problems with bowels or bladder.  I think she has a Foley catheter in right now.  She seems to have a fairly decent urine output.  So far, there is no labs back as of yet.  Radiology could not get a renal ultrasound on her because of the position that she is him secondary to her broken arm.  It would be nice if the arm could be fixed.  However, I doubt that orthopedic surgery or anesthesiology would operate on her given her renal failure right now.  Her vital signs show temperature of 97.8.  Pulse 105.  Blood pressure 124/83.  Her lungs sound clear bilaterally.  She has good air movement bilaterally.  Cardiac exam regular rate and rhythm.  I hear no murmurs.  Abdomen is soft.  Bowel sounds are present.  Extremity shows the sling for the left arm.  Lower extremities show some trace edema.  Neurological exam is nonfocal.  Michelle [name] essentially has light chain myeloma.  This is really her problem.  This has been much more aggressive than I would have thought.  She started a new chemotherapy protocol a week ago.  We really need to keep her on this.  As such, she will be treated today.  Unfortunately, we cannot do the daratumumab in the hospital.  Again I will try her on some MS Contin and add a low-dose to try to help with chronic pain control.  I will have her on some low-dose Marinol.  She will continue the Zofran.  Again she is quite sensitive to treatment and she has significant  nausea and vomiting.  I know that she will get incredible care from everybody up on 6 E.  I appreciate everybody's help.  This is truly complicated.   [name], MD  Oswaldo Milian 9:6

## 2022-10-19 NOTE — Progress Notes (Addendum)
[facility] Progress Note  Subjective: 1000 cc UOP yest, creat today 6.9. No new c/o's.   Vitals:   10/18/22 1310 10/18/22 1332 10/18/22 2001 10/19/22 0507  BP: 128/74  128/71 124/83  Pulse: (!) 105  98 (!) 105  Resp: _0 Temp: 98.7 F (37.1 C)  98.2 F (36.8 C) 97.8 F (36.6 C)  TempSrc: Oral  Oral Oral  SpO2: 97%  98% 97%  Weight:  77.3 kg      Exam: Gen alert, no distress No jvd or bruits Chest clear bilat to bases RRR no MRG Abd soft ntnd no mass or ascites +bs MS LUE ace wrapped Ext no LE edema Neuro is alert, Ox 3 , nf    Home meds include - synthroid, crestor, metoprolol 25 bid, prns       Date                         Creat               eGFR    2009- 2011               0.75- 1.05    2016                         1.32    June 2023                1.04 Jun 2022                  2.03 - 2.73   Sept 13-29                2.26 >> 1.93    24- 29 ml/min      Oct 2023                  1.62                                 Nov 3- 30, 2023       1.25- 1.53        38- 48 ml/min    10/11/22                     2.22                     12/14                        7.72    10/18/22                   7.09         UA 12/14 - mod LE, neg prot, 0-5 rbc/ epi, 11-20 wbc   B/l creat 1.53- 2.26 from sept - nov 2023, eGFR 29- 38 ml/min    BP's in ED normal at 140/80 range, HR's high 133> 112- 119    RR 18-20.  RA 98%.     Assessment/ Plan: AKI on CKD 3b - b/l creat 1.53- 2.26 from sept - nov 2023, eGFR 29- 38 ml/min. Creat here 7.7 on admission in setting of multiple myeloma w/ high LC's. UA  negative. Unable to do renal US due to L arm fracture/ immobility. Suspect AKI / CKD due to myeloma kidney,  hopefully there is a volume component, too. Mild asterixis today is better. Keep foley in another 1-2 days. Cont IVF"s. Renal function not much better but tolerating well.  L humerus fracture - per pmd Multiple myeloma - diagnosed June 2023 w/ extensive spinal  disease. Recent admit showed malignant R pleural effusion. Has had radiation therapy and chemoRx. F/b Dr Marin Olp. Sinus tach - may be dry, was vomiting at home pta Hyopthyroidism GOC - pt has sig complications from her aggressive cancer/ myeloma. Not sure if she would do well on dialysis, and hopefully will not progress in that direction, but in the meantime would consider getting palliative team involved for [place]. Have d/w pmd.            Michelle [name] 10/19/2022, 6:41 AM   Recent Labs  Lab 10/17/22 1712 10/17/22 2202 10/18/22 0849 10/19/22 0515  HGB 10.0* 9.3*  --  8.4*  CALCIUM 10.2  --  9.4  --   CREATININE 7.72* 7.16* 7.09*  --   K 5.1  --  4.7  --    No results for input(s): "IRON", "TIBC", "FERRITIN" in the last 168 hours. Inpatient medications:  carfilzomib  20 mg/m2 (Treatment Plan Recorded) Intravenous Once   Chlorhexidine Gluconate Cloth  6 each Topical QHS   cyclophosphamide  400 mg Intravenous Once   heparin  5,000 Units Subcutaneous Q8H   levothyroxine  88 mcg Oral QAC breakfast   metoprolol tartrate  12.5 mg Oral BID    sodium chloride Stopped (10/19/22 0042)   dexamethasone (DECADRON) IVPB ([facility])     ondansetron (ZOFRAN) IV 8 mg (10/19/22 0520)   acetaminophen, influenza vac split quadrivalent PF, melatonin, oxyCODONE, pneumococcal 23 valent vaccine, prochlorperazine

## 2022-10-20 DIAGNOSIS — Z7189 Other specified counseling: Secondary | ICD-10-CM | POA: Diagnosis not present

## 2022-10-20 DIAGNOSIS — R531 Weakness: Secondary | ICD-10-CM | POA: Diagnosis not present

## 2022-10-20 DIAGNOSIS — E860 Dehydration: Secondary | ICD-10-CM | POA: Diagnosis not present

## 2022-10-20 DIAGNOSIS — D464 Refractory anemia, unspecified: Secondary | ICD-10-CM | POA: Diagnosis not present

## 2022-10-20 DIAGNOSIS — N179 Acute kidney failure, unspecified: Secondary | ICD-10-CM | POA: Diagnosis not present

## 2022-10-20 DIAGNOSIS — C9000 Multiple myeloma not having achieved remission: Secondary | ICD-10-CM | POA: Diagnosis not present

## 2022-10-20 LAB — CBC
HCT: 26.3 % — ABNORMAL LOW (ref 36.0–46.0)
Hemoglobin: 8.4 g/dL — ABNORMAL LOW (ref 12.0–15.0)
MCH: 27.8 pg (ref 26.0–34.0)
MCHC: 31.9 g/dL (ref 30.0–36.0)
MCV: 87.1 fL (ref 80.0–100.0)
Platelets: 192 10*3/uL (ref 150–400)
RBC: 3.02 MIL/uL — ABNORMAL LOW (ref 3.87–5.11)
RDW: 16.1 % — ABNORMAL HIGH (ref 11.5–15.5)
WBC: 11.5 10*3/uL — ABNORMAL HIGH (ref 4.0–10.5)
nRBC: 0 % (ref 0.0–0.2)

## 2022-10-20 LAB — COMPREHENSIVE METABOLIC PANEL
ALT: 10 U/L (ref 0–44)
AST: 24 U/L (ref 15–41)
Albumin: 2.7 g/dL — ABNORMAL LOW (ref 3.5–5.0)
Alkaline Phosphatase: 63 U/L (ref 38–126)
Anion gap: 11 (ref 5–15)
BUN: 65 mg/dL — ABNORMAL HIGH (ref 8–23)
CO2: 19 mmol/L — ABNORMAL LOW (ref 22–32)
Calcium: 8.9 mg/dL (ref 8.9–10.3)
Chloride: 108 mmol/L (ref 98–111)
Creatinine, Ser: 6.79 mg/dL — ABNORMAL HIGH (ref 0.44–1.00)
GFR, Estimated: 6 mL/min — ABNORMAL LOW (ref >60)
Glucose, Bld: 128 mg/dL — ABNORMAL HIGH (ref 70–99)
Potassium: 4.9 mmol/L (ref 3.5–5.1)
Sodium: 138 mmol/L (ref 135–145)
Total Bilirubin: 0.4 mg/dL (ref 0.3–1.2)
Total Protein: 5.7 g/dL — ABNORMAL LOW (ref 6.5–8.1)

## 2022-10-20 LAB — PHOSPHORUS: Phosphorus: 5.5 mg/dL — ABNORMAL HIGH (ref 2.5–4.6)

## 2022-10-20 LAB — MAGNESIUM: Magnesium: 2.6 mg/dL — ABNORMAL HIGH (ref 1.7–2.4)

## 2022-10-20 MED ORDER — SENNOSIDES-DOCUSATE SODIUM 8.6-50 MG PO TABS
2.0000 | ORAL_TABLET | Freq: Two times a day (BID) | ORAL | Status: DC
  Administered 2022-10-20 – 2022-10-30 (×11): 2 via ORAL
  Filled 2022-10-20 (×19): qty 2

## 2022-10-20 MED ORDER — DIPHENHYDRAMINE HCL 25 MG PO CAPS
25.0000 mg | ORAL_CAPSULE | Freq: Once | ORAL | Status: AC | PRN
  Administered 2022-10-20: 25 mg via ORAL
  Filled 2022-10-20: qty 1

## 2022-10-20 MED ORDER — BISACODYL 5 MG PO TBEC
10.0000 mg | DELAYED_RELEASE_TABLET | Freq: Once | ORAL | Status: AC
  Administered 2022-10-20: 10 mg via ORAL
  Filled 2022-10-20: qty 2

## 2022-10-20 MED ORDER — HYDROMORPHONE HCL 1 MG/ML IJ SOLN
0.5000 mg | Freq: Once | INTRAMUSCULAR | Status: AC
  Administered 2022-10-20: 0.5 mg via INTRAVENOUS
  Filled 2022-10-20: qty 0.5

## 2022-10-20 NOTE — Evaluation (Signed)
Physical Therapy Evaluation Patient Details Name: Michelle [name] MRN: [id number] DOB: September 18, 1961 Today's Date: 10/20/2022  History of Present Illness  61 y.o. female with medical history significant for CKD 3B, multiple myeloma and malignant pleural effusion status post talc pleurodesis, status post radiation therapy to the skull base, treated at cancer center, who initially presented to radiation oncology at the cancer center with complaints of generalized weakness, left arm pain, and vomiting.   In the ED, workup revealed left humerus fracture, AKI and sinus tachycardia.  EDP discussed the case with orthopedic surgery, Dr. Marlou Sa.  Orthopedic surgery recommended outpatient follow-up on Monday 10/18/2022.  No plan for surgical intervention at this time.  Clinical Impression  Pt admitted with above diagnosis.  Pt currently with functional limitations due to the deficits listed below (see PT Problem List). Pt will benefit from skilled PT to increase their independence and safety with mobility to allow discharge to the venue listed below.   Pt requiring mod assist to transfer OOB to [facility] today.  Pt reports she lives with her 2 daughters and they can assist her upon d/c.  Pt declines SNF for rehab.  Pt's left arm immobilized in sling upon pt sitting EOB (was on incorrectly in bed on arrival).  Pt has not been OOB since admission and unsteady with standing and marching in place.  Recommend pt have assist for mobilizing upon d/c.         Recommendations for follow up therapy are one component of a multi-disciplinary discharge planning process, led by the attending physician.  Recommendations may be updated based on patient status, additional functional criteria and insurance authorization.  Follow Up Recommendations Home health PT      Assistance Recommended at Discharge Frequent or constant Supervision/Assistance  Patient can return home with the following  A lot of help with walking and/or transfers;A  lot of help with bathing/dressing/bathroom    Equipment Recommendations Wheelchair cushion (measurements PT);Wheelchair (measurements PT)  Recommendations for Other Services       Functional Status Assessment Patient has had a recent decline in their functional status and demonstrates the ability to make significant improvements in function in a reasonable and predictable amount of time.     Precautions / Restrictions Precautions Precautions: Fall Precaution Comments: back pain, METS Required Braces or Orthoses: Sling Restrictions Weight Bearing Restrictions: Yes LUE Weight Bearing: Non weight bearing Other Position/Activity Restrictions: no formal orders however pt with humeral fx, maintained NWB L UE with sling in place for mobility      Mobility  Bed Mobility Overal bed mobility: Needs Assistance Bed Mobility: Supine to Sit     Supine to sit: Mod assist     General bed mobility comments: increased time, cues for technique, assist for Lt LE over EOB and trunk upright    Transfers Overall transfer level: Needs assistance Equipment used: 1 person hand held assist Transfers: Sit to/from Stand, Bed to chair/wheelchair/BSC Sit to Stand: Min assist Stand pivot transfers: Min assist         General transfer comment: HHA for Rt UE, pt with difficulty marching in place due to left leg pain, requested to use bathroom so assisted to [facility]    Ambulation/Gait                  Stairs            Wheelchair Mobility    Modified Rankin (Stroke Patients Only)       Balance Overall balance assessment:  Needs assistance, History of Falls         Standing balance support: Single extremity supported, During functional activity Standing balance-Leahy Scale: Poor                               Pertinent Vitals/Pain Pain Assessment Pain Assessment: Faces Faces Pain Scale: Hurts whole lot Pain Location: left arm Pain Descriptors / Indicators:  Grimacing, Discomfort, Sore Pain Intervention(s): Repositioned, Monitored during session, Patient requesting pain meds-RN notified    Home Living Family/patient expects to be discharged to:: Private residence Living Arrangements: Children Available Help at Discharge: Family Type of Home: Apartment Home Access: Stairs to enter Entrance Stairs-Rails: None Entrance Stairs-Number of Steps: 1   Home Layout: One level Home Equipment: Conservation officer, nature (2 wheels);Cane - single point      Prior Function Prior Level of Function : Needs assist             Mobility Comments: pt reports home ambulator with [facility] and daughters assisting as needed, stays in bed while daughter is at work ADLs Comments: daughters assisting with self care tasks, daughters complete household chores     Hand Dominance        Extremity/Trunk Assessment        Lower Extremity Assessment Lower Extremity Assessment: Generalized weakness    Cervical / Trunk Assessment Cervical / Trunk Assessment: Normal  Communication   Communication: No difficulties  Cognition Arousal/Alertness: Awake/alert Behavior During Therapy: WFL for tasks assessed/performed Overall Cognitive Status: No family/caregiver present to determine baseline cognitive functioning                                 General Comments: uncertain of baseline cognition however pt not always connecting statements, seemed confused at times        General Comments      Exercises     Assessment/Plan    PT Assessment Patient needs continued PT services  PT Problem List Decreased strength;Decreased range of motion;Decreased activity tolerance;Decreased balance;Decreased mobility;Decreased knowledge of use of DME;Pain;Cardiopulmonary status limiting activity       PT Treatment Interventions DME instruction;Gait training;Functional mobility training;Therapeutic activities;Therapeutic exercise;Balance training;Patient/family  education;Wheelchair mobility training    PT Goals (Current goals can be found in the Care Plan section)  Acute Rehab PT Goals PT Goal Formulation: With patient Time For Goal Achievement: 11/03/22 Potential to Achieve Goals: Good    Frequency Min 3X/week     Co-evaluation               AM-PAC PT "6 Clicks" Mobility  Outcome Measure Help needed turning from your back to your side while in a flat bed without using bedrails?: A Lot Help needed moving from lying on your back to sitting on the side of a flat bed without using bedrails?: A Lot Help needed moving to and from a bed to a chair (including a wheelchair)?: A Lot Help needed standing up from a chair using your arms (e.g., wheelchair or bedside chair)?: A Lot Help needed to walk in hospital room?: Total Help needed climbing 3-5 steps with a railing? : Total 6 Click Score: 10    End of Session Equipment Utilized During Treatment: Gait belt Activity Tolerance: Patient limited by fatigue;Patient limited by pain Patient left: with call bell/phone within reach (on [address], OT to enter room, RN also aware) Nurse Communication: Mobility status  PT Visit Diagnosis: Other abnormalities of gait and mobility (R26.89);Muscle weakness (generalized) (M62.81);Difficulty in walking, not elsewhere classified (R26.2)    Time: 5053-9767 PT Time Calculation (min) (ACUTE ONLY): 26 min   Charges:   PT Evaluation $PT Eval Low Complexity: 1 Low PT Treatments $Therapeutic Activity: 8-22 mins       Jannette Spanner PT, DPT Physical Therapist Acute Rehabilitation Services Preferred contact method: Secure Chat Weekend Pager Only: [phone] Office: [place] 10/20/2022, 12:30 PM

## 2022-10-20 NOTE — Progress Notes (Signed)
[facility] Progress Note  Subjective: I/O yest 1.5 L in and 550 out. Creat down minimally to 6.79.  Pt seen in room, no new c/o's. Was trying to walk w/ PT.   Vitals:   10/19/22 0737 10/19/22 2041 10/20/22 0500 10/20/22 0620  BP: 127/76 117/68  116/78  Pulse: 100 (!) 106  99  Resp: _0 Temp: 98.9 F (37.2 C) 98.1 F (36.7 C)  97.9 F (36.6 C)  TempSrc: Oral Oral  Oral  SpO2: 97% 98%  98%  Weight:   79 kg     Exam: Gen alert, no distress No jvd or bruits Chest clear bilat to bases RRR no MRG Abd soft ntnd no mass or ascites +bs MS LUE ace wrapped Ext no LE edema Neuro is alert, Ox 3 , nf    Home meds include - synthroid, crestor, metoprolol 25 bid, prns       Date                         Creat               eGFR    2009- 2011               0.75- 1.05    2016                         1.32    June 2023                1.04 Jun 2022                  2.03 - 2.73   Sept 13-29                2.26 >> 1.93    24- 29 ml/min      Oct 2023                  1.62                                 Nov 3- 30, 2023       1.25- 1.53        38- 48 ml/min    10/11/22                     2.22                     12/14                        7.72    10/18/22                   7.09         UA 12/14 - mod LE, neg prot, 0-5 rbc/ epi, 11-20 wbc   B/l creat 1.53- 2.26 from sept - nov 2023, eGFR 29- 38 ml/min    BP's in ED normal at 140/80 range, HR's high 133> 112- 119    RR 18-20.  RA 98%.     Assessment/ Plan: AKI on CKD 3b - b/l creat 1.53- 2.26 from sept - nov 2023, eGFR 29- 38 ml/min. Creat here 7.7 on admission in setting of multiple myeloma w/ high LC's. UA  negative. CT abd noncontrast  showed no GU obstruction. Suspect AKI / CKD due to myeloma kidney. IVF"s dc'd, agree, she is eating and euvolemic on exam. Treatment is chemoRx. Per oncology is starting new chemoRx with cytoxan and carfilzomib, given  yesterday 12/16. No uremic issues at this time. Will follow.  L  humerus fracture - per pmd Multiple myeloma - diagnosed June 2023 w/ extensive spinal disease. Recent admit showed malignant R pleural effusion. F/b Dr Marin Olp [place] - pt has sig complications from her aggressive cancer/ myeloma and now impending end-stage kidney failure is a real possibility. Have d/w pt briefly. Palliative care team to be consulted for [place].    Rob Maurice Fotheringham 10/20/2022, 9:41 AM   Recent Labs  Lab 10/19/22 0515 10/19/22 0556 10/20/22 0500  HGB 8.4*  --  8.4*  ALBUMIN  --  2.8* 2.7*  CALCIUM  --  9.1 8.9  PHOS  --  5.5* 5.5*  CREATININE  --  6.96* 6.79*  K  --  5.0 4.9    No results for input(s): "IRON", "TIBC", "FERRITIN" in the last 168 hours. Inpatient medications:  Chlorhexidine Gluconate Cloth  6 each Topical QHS   dronabinol  2.5 mg Oral TID AC   heparin  5,000 Units Subcutaneous Q8H   levothyroxine  88 mcg Oral QAC breakfast   metoprolol tartrate  12.5 mg Oral BID   morphine  15 mg Oral Q12H    ondansetron (ZOFRAN) IV 8 mg (10/20/22 0536)   acetaminophen, influenza vac split quadrivalent PF, melatonin, pneumococcal 23 valent vaccine, prochlorperazine

## 2022-10-20 NOTE — Progress Notes (Signed)
   [facility]                                                                               Michelle [name], is a 61 y.o. female, DOB - 12/17/1960, MRN:[id number] Admit date - 10/17/2022    Outpatient Primary MD for the patient is Varadarajan, Rupashree, MD  LOS - 3  days    Brief summary    Michelle [name] is a 61 y.o. female with medical history significant for CKD 3B, multiple myeloma and malignant pleural effusion status post talc pleurodesis, status post radiation therapy to the skull base, treated at cancer center, who initially presented to radiation oncology at the cancer center with complaints of generalized weakness, left arm pain, and vomiting. . X-rays were done. The patient was informed she had a humerus fracture and was advised to go to the ED for further management.  In the ED, workup revealed left humerus fracture, AKI and sinus tachycardia.  EDP discussed the case with orthopedic surgery, Dr. Dean.  Orthopedic surgery recommended outpatient follow-up on Monday 10/18/2022.  No plan for surgical intervention at this time.  The patient prefers to be admitted at [place] rather than [place].  Admitted by [facility], hospitalist service.    Assessment & Plan    Assessment and Plan:   AKI;  Superimposed on Stage 3 b CKD in the setting of MM and poor  oral intake.  Probably secondary to a combination of dehydration and hypotension and myeloma.  US renal ordered and pending. Creatinine on admission at 7.7, baseline creatinine at 2.2. gentle hydrated and repeat renal parameters show only slight improvement to 6.96 to 6.76. . Her phos is 5.5  Continue with strict intake and output.  Nephrology on board and appreciate recommendations.   Multiple Myeloma:  She has light chain myeloma.  Diagnosed in June 2023 with extensive spinal disease.  Dr Ennever on board and she is scheduled for cytoxan and carfilzomib.     Malignant right pleural effusion:  CXR shows  Right  pleural effusion has progressed. Pleural based density right upper lobe appears slightly larger. She is on RA and does not appear to be symptomatic from the pleural effusion.  Appears comfortable.    Let humerus fracture:  - continue with splint.  Pain control with MS contin, along with oxycodone and dilaudid IV.  - physical therapy evaluation ordered.    Sinus tachycardia:  Much improved.  - continue with lopressor 12.5 mg BID. .    Hypothyroidism; Resume synthroid 88 mcg daily.      Anemia of chronic disease: -hemoglobin dropped to 8.4.  Transfuse to keep hemoglobin greater than 7.   In view of her aggressive MM, pathological fracture, AKI, palliative care consulted for goals of care.    Estimated body mass index is 29.66 kg/m as calculated from the following:   Height as of an earlier encounter on 10/17/22: 5' 4.25" (1.632 m).   Weight as of this encounter: 79 kg.  Code Status: full code.  DVT Prophylaxis:  heparin injection 5,000 Units Start: 10/17/22 2200   Level of Care: Level of care: Med-Surg Family Communication: daughter at bedside.     Disposition Plan:     Remains inpatient appropriate:  chemo and AKI.   Procedures:  US renal.   Consultants:   Nephrology Oncology.   Antimicrobials:   Anti-infectives (From admission, onward)    None        Medications  Scheduled Meds:  Chlorhexidine Gluconate Cloth  6 each Topical QHS   dronabinol  2.5 mg Oral TID AC   heparin  5,000 Units Subcutaneous Q8H   levothyroxine  88 mcg Oral QAC breakfast   metoprolol tartrate  12.5 mg Oral BID   morphine  15 mg Oral Q12H   senna-docusate  2 tablet Oral BID   Continuous Infusions:  ondansetron (ZOFRAN) IV 8 mg (10/20/22 1616)   PRN Meds:.acetaminophen, influenza vac split quadrivalent PF, melatonin, pneumococcal 23 valent vaccine, prochlorperazine    Subjective:   Deetta Siegmann was seen and examined today.  No new complaints.  Asterixis.   Objective:    Vitals:   10/19/22 2041 10/20/22 0500 10/20/22 0620 10/20/22 1322  BP: 117/68  116/78 128/81  Pulse: (!) 106  99 88  Resp: _0 Temp: 98.1 F (36.7 C)  97.9 F (36.6 C) 97.9 F (36.6 C)  TempSrc: Oral  Oral Oral  SpO2: 98%  98% 98%  Weight:  79 kg      Intake/Output Summary (Last 24 hours) at 10/20/2022 1819 Last data filed at 10/20/2022 1737 Gross per 24 hour  Intake 240 ml  Output 700 ml  Net -460 ml    Filed Weights   10/18/22 0244 10/18/22 1332 10/20/22 0500  Weight: 78.2 kg 77.3 kg 79 kg     Exam General exam: Appears calm and comfortable  Respiratory system: Clear to auscultation. Respiratory effort normal. Cardiovascular system: S1 & S2 heard, RRR. No JVD, No pedal edema. Gastrointestinal system: Abdomen is nondistended, soft and nontender.  Central nervous system: Alert and oriented. No focal neurological deficits.asterixis improved.  Extremities: Symmetric 5 x 5 power. Skin: No rashes, lesions or ulcers Psychiatry:Mood & affect appropriate.        Data Reviewed:  I have personally reviewed following labs and imaging studies   CBC Lab Results  Component Value Date   WBC 11.5 (H) 10/20/2022   RBC 3.02 (L) 10/20/2022   HGB 8.4 (L) 10/20/2022   HCT 26.3 (L) 10/20/2022   MCV 87.1 10/20/2022   MCH 27.8 10/20/2022   PLT 192 10/20/2022   MCHC 31.9 10/20/2022   RDW 16.1 (H) 10/20/2022   LYMPHSABS 1.2 10/11/2022   MONOABS 3.5 (H) 10/11/2022   EOSABS 0.1 10/11/2022   BASOSABS 0.0 42/39/5320     Last metabolic panel Lab Results  Component Value Date   NA 138 10/20/2022   K 4.9 10/20/2022   CL 108 10/20/2022   CO2 19 (L) 10/20/2022   BUN 65 (H) 10/20/2022   CREATININE 6.79 (H) 10/20/2022   GLUCOSE 128 (H) 10/20/2022   GFRNONAA 6 (L) 10/20/2022   GFRAA 51 (L) 07/12/2015   CALCIUM 8.9 10/20/2022   PHOS 5.5 (H) 10/20/2022   PROT 5.7 (L) 10/20/2022   ALBUMIN 2.7 (L) 10/20/2022   LABGLOB 2.9 10/11/2022   AGRATIO 1.1 10/11/2022    BILITOT 0.4 10/20/2022   ALKPHOS 63 10/20/2022   AST 24 10/20/2022   ALT 10 10/20/2022   ANIONGAP 11 10/20/2022    CBG (last 3)  No results for input(s): "GLUCAP" in the last 72 hours.    Coagulation Profile: No results for input(s): "INR", "PROTIME" in  the last 168 hours.   Radiology Studies: CT Abd Limited W/O Cm  Result Date: 10/19/2022 CLINICAL DATA:  Acute kidney failure. Suboptimal renal ultrasound due to immobility and bowel-gas EXAM: CT ABDOMEN WITHOUT CONTRAST LIMITED TECHNIQUE: Multidetector CT imaging of the abdomen was performed following the standard protocol without IV contrast. RADIATION DOSE REDUCTION: This exam was performed according to the departmental dose-optimization program which includes automated exposure control, adjustment of the mA and/or kV according to patient size and/or use of iterative reconstruction technique. COMPARISON:  Ultrasound earlier today and CT abdomen and pelvis 07/18/2022 FINDINGS: Lower chest: Moderate right and small left pleural effusions. Fluid tracks into the right major fissure. Associated atelectasis/infiltrates. Partially visualized CVC. Coronary artery calcification. Hepatobiliary: Respiratory motion obscures portions of the upper abdomen. Unremarkable noncontrast appearance of the liver. No biliary dilation. No cholelithiasis. Pancreas: Unremarkable. No pancreatic ductal dilatation or surrounding inflammatory changes. Spleen: Normal in size without focal abnormality. Adrenals/Urinary Tract: Adrenal glands are unremarkable. Kidneys are normal, without renal calculi, focal lesion, or hydronephrosis. Stomach/Bowel: Normal caliber large and small bowel were visualized. Moderate stool burden in the visualized portions of the ascending and transverse colon. Vascular/Lymphatic: Aortic atherosclerosis. No enlarged abdominal or pelvic lymph nodes. Other: No free intraperitoneal air. Musculoskeletal: Expansile lytic lesion and soft tissue mass in the  anterior right sixth rib. The mass measures 6.2 by 8.3 cm and causes mass effect on the right hepatic lobe. Additional partially visualized expansile lytic mass in the left iliac wing. Multiple bilateral subacute/chronic rib fractures and depressed sternal fracture. Predominantly lytic lesions throughout the thoracolumbar spine with multiple vertebral body compression deformities similar to 07/18/2022. IMPRESSION: Unremarkable noncontrast appearance of the kidneys. No hydronephrosis or visualized obstructing urinary calculi. Progression of diffuse osseous lytic lesions in the axial and appendicular skeleton compatible with multiple myeloma. This includes increased size of the expansile lytic lesion with soft tissue mass in the anterior right sixth rib and left iliac wing. Multiple pathologic vertebral body compression fractures are similar to 07/18/2022. Moderate colonic stool burden. Moderate right and small left pleural effusions. Electronically Signed   By: Tyler  Stutzman M.D.   On: 10/19/2022 20:01   US RENAL  Result Date: 10/19/2022 CLINICAL DATA:  AKI. Per technologist note, suboptimal images due to patient breathing an overlying bowel gas. EXAM: RENAL / URINARY TRACT ULTRASOUND COMPLETE COMPARISON:  CT abdomen and pelvis July 18, 2022 FINDINGS: Right Kidney: Renal measurements: 8.6 x 3.9 x 4.8 cm = volume: 84.6 mL. Echogenicity within normal limits. No mass or hydronephrosis visualized. Left Kidney: Renal measurements: 9.9 x 6.1 x 5.0 cm = volume: 158.5 mL. Echogenicity within normal limits. No mass or hydronephrosis visualized. Bladder: Decompressed with Foley catheter in place. Other: None. IMPRESSION: No acute findings. Electronically Signed   By: Meghana  Konanur M.D.   On: 10/19/2022 17:26         M.D. [facility] 10/20/2022, 6:19 PM  Available via Epic secure chat 7am-7pm After 7 pm, please refer to night coverage provider listed on amion.    

## 2022-10-20 NOTE — Evaluation (Signed)
Occupational Therapy Evaluation Patient Details Name: Michelle [name] MRN: [id number] DOB: 1961/07/09 Today's Date: 10/20/2022   History of Present Illness 61 y.o. female with medical history significant for CKD 3B, multiple myeloma and malignant pleural effusion status post talc pleurodesis, status post radiation therapy to the skull base, treated at cancer center, who initially presented to radiation oncology at the cancer center with complaints of generalized weakness, left arm pain, and vomiting.   In the ED, workup revealed left humerus fracture, AKI and sinus tachycardia.  EDP discussed the case with orthopedic surgery, Dr. Marlou Sa.  Orthopedic surgery recommended outpatient follow-up on Monday 10/18/2022.  No plan for surgical intervention at this time.   Clinical Impression   Michelle [name] is a 61 year old woman who presents with generalized weakness, decreased activity tolerance, pain, impaired balance, work finding issued and without functional use of non-dominant LUE. On evaluation she needs a lot of assistance for ADLs, can barely tolerate any positioning or touching of LUE, and min assist to stand and take steps from [facility] to recliner. She exhibits impaired cognition - she is alert to self, month, year and near to the date. However, she exhibits word finding issues, some repetitive conversation, and memory deficits. Her daughters who came into the room reports this is new. Patient wants to go home at discharge and family reports they can take care of her. Have needed DME. Instructed them on sling use and some compensatory strategies for dressing and bathing. Will follow while patient in hospital.      Recommendations for follow up therapy are one component of a multi-disciplinary discharge planning process, led by the attending physician.  Recommendations may be updated based on patient status, additional functional criteria and insurance authorization.   Follow Up Recommendations  Home health  OT     Assistance Recommended at Discharge Frequent or constant Supervision/Assistance  Patient can return home with the following A little help with walking and/or transfers;A lot of help with bathing/dressing/bathroom;Assistance with cooking/housework;Direct supervision/assist for financial management;Assist for transportation;Help with stairs or ramp for entrance;Direct supervision/assist for medications management    Functional Status Assessment  Patient has had a recent decline in their functional status and demonstrates the ability to make significant improvements in function in a reasonable and predictable amount of time.  Equipment Recommendations  None recommended by OT    Recommendations for Other Services       Precautions / Restrictions Precautions Precautions: Fall Precaution Comments: back pain, METS Required Braces or Orthoses: Sling Restrictions Weight Bearing Restrictions: Yes LUE Weight Bearing: Non weight bearing Other Position/Activity Restrictions: no formal orders however pt with humeral fx, maintained NWB L UE with sling in place for mobility      Mobility Bed Mobility               General bed mobility comments: up on BSC    Transfers Overall transfer level: Needs assistance Equipment used: Rolling walker (2 wheels) Transfers: Sit to/from Stand Sit to Stand: Min assist Stand pivot transfers: Min assist         General transfer comment: Min assist to power up and steady, one hand on side of walker to take steps to recliner      Balance Overall balance assessment: Mild deficits observed, not formally tested  ADL either performed or assessed with clinical judgement   ADL Overall ADL's : Needs assistance/impaired Eating/Feeding: Set up;Sitting   Grooming: Set up   Upper Body Bathing: Maximal assistance;Sitting   Lower Body Bathing: Sit to/from stand;Moderate assistance    Upper Body Dressing : Maximal assistance;Sitting   Lower Body Dressing: Maximal assistance;Sit to/from stand   Toilet Transfer: Minimal assistance;BSC/3in1   Toileting- Water quality scientist and Hygiene: Moderate assistance;Sit to/from stand Toileting - Clothing Manipulation Details (indicate cue type and reason): Patient able to grossly wipe herself but with poor quality     Functional mobility during ADLs: Minimal assistance General ADL Comments: Min assist on one side of walker to take steps to recliner.     Vision   Additional Comments: Unsure about underlying visual deficits. She is keeping left eye to see out of right eye but denies it is double vision. Decreased vision out of left eye compared to right. Light improves her ability to see.     Perception     Praxis      Pertinent Vitals/Pain Pain Assessment Pain Assessment: 0-10 Pain Score: 10-Worst pain ever Pain Location: left arm Pain Descriptors / Indicators: Grimacing, Discomfort, Sore, Guarding Pain Intervention(s): Limited activity within patient's tolerance     Hand Dominance Right   Extremity/Trunk Assessment Upper Extremity Assessment Upper Extremity Assessment: RUE deficits/detail;LUE deficits/detail RUE Deficits / Details: WFL ROM and strength RUE Sensation: WNL RUE Coordination: WNL LUE Deficits / Details: splint from shoulder to elbow LUE: Unable to fully assess due to immobilization;Unable to fully assess due to pain   Lower Extremity Assessment Lower Extremity Assessment: Defer to PT evaluation   Cervical / Trunk Assessment Cervical / Trunk Assessment: Normal   Communication Communication Communication: No difficulties   Cognition Arousal/Alertness: Awake/alert Behavior During Therapy: WFL for tasks assessed/performed Overall Cognitive Status: Impaired/Different from baseline                                 General Comments: Alert to self, month, year and situation. however,  she is exhibiting word finding issues, poor attention. Daughters report her confusion/difficulty is new since admission     General Comments       Exercises     Shoulder Instructions      Home Living Family/patient expects to be discharged to:: Private residence Living Arrangements: Children Available Help at Discharge: Family Type of Home: Apartment Home Access: Stairs to enter Technical brewer of Steps: 1 Entrance Stairs-Rails: None Home Layout: One level     Bathroom Shower/Tub: Teacher, early years/pre: Joice: Conservation officer, nature (2 wheels);Cane - single point;BSC/3in1          Prior Functioning/Environment Prior Level of Function : Needs assist             Mobility Comments: pt reports home ambulator with [facility] and daughters assisting as needed, stays in bed while daughter is at work ADLs Comments: daughters assisting with self care tasks, daughters complete household chores        OT Problem List: Decreased strength;Decreased range of motion;Decreased activity tolerance;Impaired balance (sitting and/or standing);Impaired vision/perception;Decreased knowledge of use of DME or AE;Decreased knowledge of precautions;Pain;Impaired UE functional use;Obesity      OT Treatment/Interventions: Self-care/ADL training;Therapeutic exercise;DME and/or AE instruction;Cognitive remediation/compensation;Therapeutic activities;Balance training;Patient/family education    OT Goals(Current goals can be found in the care plan section) Acute Rehab OT Goals Patient Stated  Goal: less pain and to go home OT Goal Formulation: With patient/family Time For Goal Achievement: 11/03/22 Potential to Achieve Goals: Fair  OT Frequency: Min 2X/week    Co-evaluation              AM-PAC OT "6 Clicks" Daily Activity     Outcome Measure Help from another person eating meals?: A Little Help from another person taking care of personal grooming?: A  Little Help from another person toileting, which includes using toliet, bedpan, or urinal?: A Lot Help from another person bathing (including washing, rinsing, drying)?: A Lot Help from another person to put on and taking off regular upper body clothing?: A Lot Help from another person to put on and taking off regular lower body clothing?: A Lot 6 Click Score: 14   End of Session Equipment Utilized During Treatment: Rolling walker (2 wheels) Nurse Communication: Mobility status  Activity Tolerance: Patient limited by pain Patient left: in chair;with call bell/phone within reach;with family/visitor present  OT Visit Diagnosis: Unsteadiness on feet (R26.81);Pain Pain - Right/Left: Left Pain - part of body: Shoulder;Arm;Hand                Time: 5750-5183 OT Time Calculation (min): 37 min Charges:  OT General Charges $OT Visit: 1 Visit OT Evaluation $OT Eval Low Complexity: 1 Low OT Treatments $Self Care/Home Management : 8-22 mins  Gustavo Lah, OTR/L [place]  Office [phone]   ONNIE ALATORRE 10/20/2022, 12:59 PM

## 2022-10-20 NOTE — Consult Note (Signed)
Consultation Note Date: 10/20/2022   Patient Name: Michelle [name]  DOB: [birth year]  MRN: [id number]  Age / Sex: 61 y.o., female  PCP: Leeroy Cha, MD Referring Physician: Hosie Poisson, MD  Reason for Consultation: Establishing goals of care  HPI/Patient Profile: 61 y.o. female    admitted on 10/17/2022   Clinical Assessment and Goals of Care: 61 year old lady with light chain myeloma, Dr. Marin Olp from medical oncology is following, patient admitted with left humerus fracture, renal services also following for acute kidney injury on top of stage IIIb chronic kidney disease.  Patient diagnosed in June 2023 with multiple myeloma with extensive spinal disease.  Recently also found to have malignant right-sided pleural effusion.  Has been on radiation and chemotherapy and follows with Dr. Marin Olp. Goals of care discussions have been requested as part of palliative care consultation.  Patient is awake alert sitting up in bed attempting to feed herself her lunch.  Family including 3 daughters and several other family members present at bedside.  Chart reviewed.  Palliative consult request received.  CODE STATUS and goals of care discussions undertaken.  See below. Palliative medicine is specialized medical care for people living with serious illness. It focuses on providing relief from the symptoms and stress of a serious illness. The goal is to improve quality of life for both the patient and the family. Goals of care: Broad aims of medical therapy in relation to the patient's values and preferences. Our aim is to provide medical care aimed at enabling patients to achieve the goals that matter most to them, given the circumstances of their particular medical situation and their constraints.    NEXT OF KIN Children present at bedside.  3 daughters.  SUMMARY OF RECOMMENDATIONS   Full code full scope care.   CODE STATUS and goals of care discussions undertaken with the patient as well as her family members present at the bedside.  Patient's 3 daughters and several other family members are present at bedside.  Patient participated with PT OT.  Home health PT OT has been recommended at this time.  Cussed with patient about chaplain consultation and completion of advance care planning documents-designation of healthcare power of attorney agent.  Spiritual care services already following.  Continue current mode of care.  Thank you for the consult. Code Status/Advance Care Planning: Full code   Symptom Management:     Palliative Prophylaxis:  Delirium Protocol  Additional Recommendations (Limitations, Scope, Preferences): Full Scope Treatment  Psycho-social/Spiritual:  Desire for further Chaplaincy support:yes Additional Recommendations: Caregiving  Support/Resources  Prognosis:  Unable to determine  Discharge Planning: To Be Determined      Primary Diagnoses: Present on Admission:  AKI (acute kidney injury) ([place])   I have reviewed the medical record, interviewed the patient and family, and examined the patient. The following aspects are pertinent.  Past Medical History:  Diagnosis Date   Anemia of chronic renal failure, stage 3b ([place]) 08/02/2022   Cancer ([place])    Essential hypertension 02/16/2017   Hyperlipemia  Hyperlipidemia 02/16/2017   Hypertension    Hypothyroidism 02/16/2017   Morbid obesity ([place]) 02/16/2017   Multiple myeloma without remission ([place]) 06/20/2022   Social History   Socioeconomic History   Marital status: Widowed    Spouse name: Not on file   Number of children: Not on file   Years of education: Not on file   Highest education level: Not on file  Occupational History   Not on file  Tobacco Use   Smoking status: Former    Packs/day: 0.25    Years: 30.00    Total pack years: 7.50    Types: Cigarettes    Quit date: 105    Years since quitting:  33.9   Smokeless tobacco: Never  Vaping Use   Vaping Use: Never used  Substance and Sexual Activity   Alcohol use: No   Drug use: No   Sexual activity: Not Currently    Birth control/protection: Post-menopausal  Other Topics Concern   Not on file  Social History Narrative   Not on file   Social Determinants of Health   Financial Resource Strain: Not on file  Food Insecurity: No Food Insecurity (10/18/2022)   Hunger Vital Sign    Worried About Running Out of Food in the Last Year: Never true    Ran Out of Food in the Last Year: Never true  Transportation Needs: No Transportation Needs (10/18/2022)   PRAPARE - Hydrologist (Medical): No    Lack of Transportation (Non-Medical): No  Physical Activity: Not on file  Stress: Not on file  Social Connections: Not on file   Family History  Problem Relation Age of Onset   Diabetes Paternal Grandfather    Stroke Paternal Grandfather    Heart attack Paternal Grandfather    Diabetes Father    Hypertension Father    Stroke Father    Lung cancer Father    Hypertension Mother    Kidney disease Mother    Hypertension Sister    Stroke Paternal Grandmother    Heart attack Maternal Grandmother    Scheduled Meds:  bisacodyl  10 mg Oral Once   Chlorhexidine Gluconate Cloth  6 each Topical QHS   dronabinol  2.5 mg Oral TID AC   heparin  5,000 Units Subcutaneous Q8H   levothyroxine  88 mcg Oral QAC breakfast   metoprolol tartrate  12.5 mg Oral BID   morphine  15 mg Oral Q12H   senna-docusate  2 tablet Oral BID   Continuous Infusions:  ondansetron (ZOFRAN) IV 8 mg (10/20/22 0536)   PRN Meds:.acetaminophen, influenza vac split quadrivalent PF, melatonin, pneumococcal 23 valent vaccine, prochlorperazine Medications Prior to Admission:  Prior to Admission medications   Medication Sig Start Date End Date Taking? Authorizing Provider  famciclovir (FAMVIR) 500 MG tablet Take 1 tablet (500 mg total) by mouth  daily. 10/11/22  Yes Volanda Napoleon, MD  fluconazole (DIFLUCAN) 100 MG tablet Take 100 mg by mouth daily. 07/28/22  Yes [provider]  lactulose (CHRONULAC) 10 GM/15ML solution TAKE 15 MLS (10 G TOTAL) BY MOUTH 2 (TWO) TIMES DAILY. 10/15/22  Yes Volanda Napoleon, MD  levothyroxine (SYNTHROID) 88 MCG tablet Take 88 mcg by mouth daily before breakfast.   Yes [provider]  Multiple Vitamin (MULTIVITAMIN WITH MINERALS) TABS tablet Take 1 tablet by mouth daily. 06/27/22  Yes Hongalgi, Lenis Dickinson, MD  ondansetron (ZOFRAN) 8 MG tablet Take 1 tablet (8 mg total) by mouth every 8 (  eight) hours as needed for nausea or vomiting. 10/10/22  Yes Ennever, Rudell Cobb, MD  Oxycodone HCl 10 MG TABS Take 1 tablet (10 mg total) by mouth every 4 (four) hours as needed. Patient taking differently: Take 10 mg by mouth every 4 (four) hours as needed (for pain). 09/13/22  Yes Volanda Napoleon, MD  pantoprazole (PROTONIX) 40 MG tablet Take 1 tablet (40 mg total) by mouth 2 (two) times daily. 10/04/22  Yes Volanda Napoleon, MD  prochlorperazine (COMPAZINE) 10 MG tablet Take 1 tablet (10 mg total) by mouth every 6 (six) hours as needed for nausea or vomiting. 06/14/22  Yes Tyler Pita, MD  rosuvastatin (CRESTOR) 10 MG tablet Take 10 mg by mouth every evening.   Yes [provider]  senna (SENOKOT) 8.6 MG TABS tablet Take 1 tablet (8.6 mg total) by mouth daily as needed for mild constipation or moderate constipation. 06/26/22  Yes Hongalgi, Lenis Dickinson, MD  diazepam (VALIUM) 5 MG tablet Take thirty minutes before MRI for anxiety. Patient taking differently: Take 5 mg by mouth daily as needed (Take thirty minutes before MRI for anxiety). 09/12/22   Volanda Napoleon, MD  metoprolol tartrate (LOPRESSOR) 25 MG tablet Take 1 tablet (25 mg total) by mouth 2 (two) times daily. Patient not taking: Reported on 10/18/2022 06/26/22   Modena Jansky, MD  OLANZapine (ZYPREXA) 5 MG tablet Take 1 tablet (5 mg total) by  mouth at bedtime. Patient not taking: Reported on 10/18/2022 10/14/22   Volanda Napoleon, MD   Allergies  Allergen Reactions   Penicillins Other (See Comments)    Severe Yeast infection... No deathly reactions   Codeine Nausea Only   Morphine Other (See Comments)    headache   Tramadol Other (See Comments)    Headaches    Review of Systems Denies pain currently Physical Exam Awake alert oriented Sitting up in bed Able to feed herself Upper extremity dressings Regular work of breathing Answers all questions appropriately Abdomen is not distended  Vital Signs: BP 128/81 (BP Location: Right Arm)   Pulse 88   Temp 97.9 F (36.6 C) (Oral)   Resp 16   Wt 79 kg   SpO2 98%   BMI 29.66 kg/m  Pain Scale: 0-10 POSS *See Group Information*: 1-Acceptable,Awake and alert Pain Score: 0-No pain   SpO2: SpO2: 98 % O2 Device:SpO2: 98 % O2 Flow Rate: .O2 Flow Rate (L/min): 0 L/min  IO: Intake/output summary:  Intake/Output Summary (Last 24 hours) at 10/20/2022 1515 Last data filed at 10/20/2022 1100 Gross per 24 hour  Intake 240 ml  Output 450 ml  Net -210 ml    LBM: Last BM Date : 10/18/22 Baseline Weight: Weight: 74.8 kg Most recent weight: Weight: 79 kg     Palliative Assessment/Data:   PPS 50%  Time In:  1410 Time Out:  1510 Time Total:  60  Greater than 50%  of this time was spent counseling and coordinating care related to the above assessment and plan.  Signed by: Loistine Chance, MD   Please contact Palliative Medicine Team phone at [phone] for questions and concerns.  For individual provider: See Shea Evans

## 2022-10-21 ENCOUNTER — Telehealth: Payer: Self-pay

## 2022-10-21 DIAGNOSIS — Z515 Encounter for palliative care: Secondary | ICD-10-CM

## 2022-10-21 DIAGNOSIS — N179 Acute kidney failure, unspecified: Secondary | ICD-10-CM | POA: Diagnosis not present

## 2022-10-21 DIAGNOSIS — C9000 Multiple myeloma not having achieved remission: Secondary | ICD-10-CM | POA: Diagnosis not present

## 2022-10-21 DIAGNOSIS — Z01810 Encounter for preprocedural cardiovascular examination: Secondary | ICD-10-CM

## 2022-10-21 DIAGNOSIS — D464 Refractory anemia, unspecified: Secondary | ICD-10-CM | POA: Diagnosis not present

## 2022-10-21 DIAGNOSIS — E860 Dehydration: Secondary | ICD-10-CM | POA: Diagnosis not present

## 2022-10-21 DIAGNOSIS — Z7189 Other specified counseling: Secondary | ICD-10-CM | POA: Diagnosis not present

## 2022-10-21 LAB — CBC
HCT: 27.7 % — ABNORMAL LOW (ref 36.0–46.0)
Hemoglobin: 8.9 g/dL — ABNORMAL LOW (ref 12.0–15.0)
MCH: 27.7 pg (ref 26.0–34.0)
MCHC: 32.1 g/dL (ref 30.0–36.0)
MCV: 86.3 fL (ref 80.0–100.0)
Platelets: 174 10*3/uL (ref 150–400)
RBC: 3.21 MIL/uL — ABNORMAL LOW (ref 3.87–5.11)
RDW: 16.1 % — ABNORMAL HIGH (ref 11.5–15.5)
WBC: 6.7 10*3/uL (ref 4.0–10.5)
nRBC: 0 % (ref 0.0–0.2)

## 2022-10-21 LAB — COMPREHENSIVE METABOLIC PANEL
ALT: 14 U/L (ref 0–44)
AST: 23 U/L (ref 15–41)
Albumin: 3 g/dL — ABNORMAL LOW (ref 3.5–5.0)
Alkaline Phosphatase: 66 U/L (ref 38–126)
Anion gap: 11 (ref 5–15)
BUN: 75 mg/dL — ABNORMAL HIGH (ref 8–23)
CO2: 19 mmol/L — ABNORMAL LOW (ref 22–32)
Calcium: 9.4 mg/dL (ref 8.9–10.3)
Chloride: 109 mmol/L (ref 98–111)
Creatinine, Ser: 6.83 mg/dL — ABNORMAL HIGH (ref 0.44–1.00)
GFR, Estimated: 6 mL/min — ABNORMAL LOW (ref >60)
Glucose, Bld: 82 mg/dL (ref 70–99)
Potassium: 4.9 mmol/L (ref 3.5–5.1)
Sodium: 139 mmol/L (ref 135–145)
Total Bilirubin: 0.5 mg/dL (ref 0.3–1.2)
Total Protein: 6.1 g/dL — ABNORMAL LOW (ref 6.5–8.1)

## 2022-10-21 LAB — MAGNESIUM: Magnesium: 2.4 mg/dL (ref 1.7–2.4)

## 2022-10-21 LAB — PHOSPHORUS: Phosphorus: 5.3 mg/dL — ABNORMAL HIGH (ref 2.5–4.6)

## 2022-10-21 MED ORDER — LIP MEDEX EX OINT
1.0000 | TOPICAL_OINTMENT | CUTANEOUS | Status: DC | PRN
  Administered 2022-10-21: 1 via TOPICAL
  Filled 2022-10-21: qty 7

## 2022-10-21 MED ORDER — MEGESTROL ACETATE 400 MG/10ML PO SUSP
400.0000 mg | Freq: Two times a day (BID) | ORAL | Status: DC
  Administered 2022-10-21 – 2022-10-31 (×21): 400 mg via ORAL
  Filled 2022-10-21 (×21): qty 10

## 2022-10-21 MED ORDER — OXYCODONE HCL 5 MG PO TABS
5.0000 mg | ORAL_TABLET | ORAL | Status: AC | PRN
  Administered 2022-10-22 – 2022-10-24 (×3): 5 mg via ORAL
  Filled 2022-10-21 (×3): qty 1

## 2022-10-21 MED ORDER — HYDROMORPHONE HCL 1 MG/ML IJ SOLN
0.5000 mg | INTRAMUSCULAR | Status: AC | PRN
  Administered 2022-10-21 – 2022-10-22 (×2): 0.5 mg via INTRAVENOUS
  Filled 2022-10-21 (×2): qty 0.5

## 2022-10-21 MED ORDER — HYDROMORPHONE HCL 1 MG/ML IJ SOLN
0.5000 mg | Freq: Once | INTRAMUSCULAR | Status: AC | PRN
  Administered 2022-10-21: 0.5 mg via INTRAVENOUS
  Filled 2022-10-21: qty 0.5

## 2022-10-21 MED ORDER — ONDANSETRON 8 MG/NS 50 ML IVPB
8.0000 mg | Freq: Three times a day (TID) | INTRAVENOUS | Status: DC
  Administered 2022-10-21 – 2022-10-31 (×29): 8 mg via INTRAVENOUS
  Filled 2022-10-21 (×3): qty 54
  Filled 2022-10-21: qty 8
  Filled 2022-10-21: qty 54
  Filled 2022-10-21: qty 8
  Filled 2022-10-21 (×2): qty 54
  Filled 2022-10-21 (×7): qty 8
  Filled 2022-10-21: qty 54
  Filled 2022-10-21 (×4): qty 8
  Filled 2022-10-21 (×3): qty 54
  Filled 2022-10-21 (×2): qty 8
  Filled 2022-10-21: qty 54
  Filled 2022-10-21 (×4): qty 8
  Filled 2022-10-21 (×2): qty 54
  Filled 2022-10-21 (×2): qty 8
  Filled 2022-10-21 (×3): qty 54

## 2022-10-21 MED ORDER — OXYCODONE HCL 5 MG PO TABS
5.0000 mg | ORAL_TABLET | ORAL | Status: AC | PRN
  Administered 2022-10-21: 5 mg via ORAL
  Filled 2022-10-21: qty 1

## 2022-10-21 MED ORDER — LACTULOSE 10 GM/15ML PO SOLN
20.0000 g | Freq: Two times a day (BID) | ORAL | Status: DC
  Administered 2022-10-21 – 2022-10-30 (×10): 20 g via ORAL
  Filled 2022-10-21 (×17): qty 30

## 2022-10-21 MED ORDER — EPOETIN ALFA 40000 UNIT/ML IJ SOLN
40000.0000 [IU] | Freq: Once | INTRAMUSCULAR | Status: AC
  Administered 2022-10-21: 40000 [IU] via SUBCUTANEOUS
  Filled 2022-10-21: qty 1

## 2022-10-21 NOTE — Progress Notes (Signed)
Overall, Michelle [name] might be a little bit better.  She really is bothered by the pain in her left arm.  I was wondering if she might be able to have this fixed while in the hospital.  It would be nice to see if Orthopedic Surgery can see her to see about repairing the arm to give her a little more function and mobility.  From a oncologic point of view, I will see problems with her having surgery.  Her renal function has improved but has were stabilized right now.  Her BUN is 75 creatinine 6.83.  Her phosphorus is 5.3.  Calcium 9.4 with an albumin of 3.0.  She is a little constipated.  Maybe we can get her to go today.  Maybe little lactulose might help.  She had chemotherapy on Saturday.  I would not expect that her blood counts will be all that affected.  Her CBC shows white count of 6.7.  Hemoglobin 8.9.  Platelet count 174,000.  She says she does not like the Marinol.  We will try her on some Megace to see if this may help with her appetite a little bit better.  She has had no cough.  She has had no obvious bleeding.  She has had no rashes.  There is been no fever.  Her vital signs show temperature of 97.7.  Pulse 108.  Blood pressure 125/78.  Her lungs sound clear bilaterally.  Cardiac exam is tachycardic but regular.  She has no murmurs.  Abdomen is soft.  She has decent bowel sounds.  There is no guarding or rebound tenderness.  Extremities does show the left arm in a sling.  She has tenderness with palpation of the left upper arm.  She can move her fingers.  She has a decent pulse in the distal extremities.  Her legs show mild edema.  She has good strength in her legs bilaterally.  Neurological exam shows no focal neurological deficits.  [name] has a pathologic fracture of the left humerus.  She has a renal failure.  She has basically light chain myeloma.  We did treat her on Saturday.  Again her biggest problem by far as the left arm.  It would be really really nice if this could be fixed  while she was here.  This would at least allow her to start some therapy and get some more mobility.  I know that she will need radiation therapy to that arm.  We will see about the Megace to try to help her appetite.  I will give her some lactulose to try to help with the bowels.  She has not had any nausea or vomiting which is certainly a good thing.  The Zofran is helping.  I do appreciate the great care that she is getting from the wonderful staff upon 6 E.  This is a very complicated situation and the staff are doing a tremendous job.  [name], MD  2 Cor 9:15

## 2022-10-21 NOTE — Progress Notes (Signed)
Daily Progress Note   Patient Name: Michelle [name]       Date: 10/21/2022 DOB: 04-23-1961  Age: 61 y.o. MRN#: [id number] Attending Physician: Hosie Poisson, MD Primary Care Physician: Leeroy Cha, MD Admit Date: 10/17/2022  Reason for Consultation/Follow-up: Establishing goals of care  Subjective: Complains of pain in L groin area and L shoulder. States that she was trying to get up out of bed and has noticed severe pain in L groin and L upper thigh area. Patient repositioned in bed alongside RN colleague and we talked about using K pad, and using her PRN pain medications. Family at bedside.   Length of Stay: 4  Current Medications: Scheduled Meds:   Chlorhexidine Gluconate Cloth  6 each Topical QHS   heparin  5,000 Units Subcutaneous Q8H   lactulose  20 g Oral BID   levothyroxine  88 mcg Oral QAC breakfast   megestrol  400 mg Oral BID   metoprolol tartrate  12.5 mg Oral BID   morphine  15 mg Oral Q12H   senna-docusate  2 tablet Oral BID    Continuous Infusions:  ondansetron (ZOFRAN) IV 8 mg (10/21/22 0700)    PRN Meds: acetaminophen, influenza vac split quadrivalent PF, melatonin, oxyCODONE, pneumococcal 23 valent vaccine, prochlorperazine  Physical Exam         Pain in the left lower quadrant as well as groin area pain left shoulder Regular work of breathing Awake alert oriented No edema  Vital Signs: BP 125/78 (BP Location: Right Arm)   Pulse (!) 108   Temp 97.7 F (36.5 C) (Oral)   Resp 18   Ht 5' 4.25" (1.632 m)   Wt 82 kg   SpO2 99%   BMI 30.79 kg/m  SpO2: SpO2: 99 % O2 Device: O2 Device: Room Air O2 Flow Rate: O2 Flow Rate (L/min): 0 L/min  Intake/output summary:  Intake/Output Summary (Last 24 hours) at 10/21/2022 1010 Last data filed at  10/21/2022 0500 Gross per 24 hour  Intake 530 ml  Output 1050 ml  Net -520 ml   LBM: Last BM Date : 10/18/22 Baseline Weight: Weight: 74.8 kg Most recent weight: Weight: 82 kg       Palliative Assessment/Data:      Patient Active Problem List   Diagnosis Date Noted   Malignant pleural effusion  10/01/2022   Stage 3a chronic kidney disease (CKD) ([place]) 10/01/2022   GERD (gastroesophageal reflux disease) 10/01/2022   Anxiety 10/01/2022   Anemia of chronic renal failure, stage 3b ([facility]) 08/02/2022   Pleural effusion 06/25/2022   Multiple myeloma without remission ([place]) 06/20/2022   Intractable pain 06/14/2022   Hypercalcemia 06/14/2022   AKI (acute kidney injury) ([place]) 60/08/9322   Metabolic acidosis 55/73/2202   Tachycardia 06/14/2022   Metastasis to bone ([place]) 06/10/2022   Refractory anemia ([place]) 05/17/2022   Hyperlipidemia 02/16/2017   Morbid obesity ([place]) 02/16/2017   Essential hypertension 02/16/2017   Hypothyroidism 02/16/2017    Palliative Care Assessment & Plan   Patient Profile:    Assessment: 61 year old with multiple myeloma, following with Dr. Marin Olp, extensive spinal disease, left humerus fracture, acute kidney injury on top of CKD 3B. Palliative medicine team consulted for goals of care discussions. Recommendations/Plan: Full Code, Full Scope care Add K pad Pain and non pain symptom management regimen as per Dr Antonieta Pert recommendations.  Monitor hospital course, PMT to follow peripherally.  Spiritual care consult for completion of advance directives.   Goals of Care and Additional Recommendations: Limitations on Scope of Treatment: Full Scope Treatment  Code Status:    Code Status Orders  (From admission, onward)           Start     Ordered   10/17/22 2110  Full code  Continuous       Question Answer Comment  By: Other   Comments: Full code      10/17/22 2109           Code Status History     Date Active Date Inactive Code  Status Order ID Comments User Context   10/01/2022 1130 10/03/2022 2139 Full Code [id number]  Jonnie Finner, DO Inpatient   06/14/2022 2137 06/26/2022 2108 Full Code [id number]  Orene Desanctis, DO Inpatient      Advance Directive Documentation    Flowsheet Row Most Recent Value  Type of Advance Directive Healthcare Power of Attorney  Pre-existing out of facility DNR order (yellow form or pink MOST form) --  "MOST" Form in Place? --       Prognosis:  Unable to determine  Discharge Planning: To Be Determined  Care plan was discussed with  patient, family, RN and IDT  Thank you for allowing the Palliative Medicine Team to assist in the care of this patient. Mod MDM.     Greater than 50%  of this time was spent counseling and coordinating care related to the above assessment and plan.  Loistine Chance, MD  Please contact Palliative Medicine Team phone at [phone] for questions and concerns.

## 2022-10-21 NOTE — Progress Notes (Signed)
Patient ID: Michelle [name], female   DOB: 1961-06-25, 61 y.o.   MRN: [id number] S: Pt seen in her room, no new complaints. O:BP 125/78 (BP Location: Right Arm)   Pulse (!) 108   Temp 97.7 F (36.5 C) (Oral)   Resp 18   Ht 5' 4.25" (1.632 m)   Wt 82 kg   SpO2 99%   BMI 30.79 kg/m   Intake/Output Summary (Last 24 hours) at 10/21/2022 1051 Last data filed at 10/21/2022 0500 Gross per 24 hour  Intake 530 ml  Output 1050 ml  Net -520 ml   Intake/Output: I/O last 3 completed shifts: In: 530 [P.O.:480; IV Piggyback:50] Out: 1500 [Urine:1500]  Intake/Output this shift:  No intake/output data recorded. Weight change: 2.648 kg Gen: lying in bed in NAD CVS: tachy at 108 Resp:cta Abd: +BS, soft, NT/ND Ext: no edema  Recent Labs  Lab 10/17/22 1712 10/17/22 2202 10/18/22 0849 10/19/22 0556 10/20/22 0500 10/21/22 0529  NA 137  --  140 139 138 139  K 5.1  --  4.7 5.0 4.9 4.9  CL 100  --  108 108 108 109  CO2 22  --  21* 20* 19* 19*  GLUCOSE 103*  --  95 147* 128* 82  BUN 51*  --  52* 59* 65* 75*  CREATININE 7.72* 7.16* 7.09* 6.96* 6.79* 6.83*  ALBUMIN  --   --   --  2.8* 2.7* 3.0*  CALCIUM 10.2  --  9.4 9.1 8.9 9.4  PHOS  --   --   --  5.5* 5.5* 5.3*  AST  --   --   --  _0 ALT  --   --   --  _1 Liver Function Tests: Recent Labs  Lab 10/19/22 0556 10/20/22 0500 10/21/22 0529  AST _2 ALT _3 ALKPHOS 67 63 66  BILITOT 0.6 0.4 0.5  PROT 5.8* 5.7* 6.1*  ALBUMIN 2.8* 2.7* 3.0*   No results for input(s): "LIPASE", "AMYLASE" in the last 168 hours. No results for input(s): "AMMONIA" in the last 168 hours. CBC: Recent Labs  Lab 10/17/22 1712 10/17/22 2202 10/19/22 0515 10/20/22 0500 10/21/22 0529  WBC 8.4 7.6 6.5 11.5* 6.7  HGB 10.0* 9.3* 8.4* 8.4* 8.9*  HCT 30.5* 28.9* 26.5* 26.3* 27.7*  MCV 87.4 87.0 86.9 87.1 86.3  PLT 219 200 193 192 174   Cardiac Enzymes: No results for input(s): "CKTOTAL", "CKMB", "CKMBINDEX", "TROPONINI" in the  last 168 hours. CBG: No results for input(s): "GLUCAP" in the last 168 hours.  Iron Studies: No results for input(s): "IRON", "TIBC", "TRANSFERRIN", "FERRITIN" in the last 72 hours. Studies/Results: CT Abd Limited W/O Cm  Result Date: 10/19/2022 CLINICAL DATA:  Acute kidney failure. Suboptimal renal ultrasound due to immobility and bowel-gas EXAM: CT ABDOMEN WITHOUT CONTRAST LIMITED TECHNIQUE: Multidetector CT imaging of the abdomen was performed following the standard protocol without IV contrast. RADIATION DOSE REDUCTION: This exam was performed according to the departmental dose-optimization program which includes automated exposure control, adjustment of the mA and/or kV according to patient size and/or use of iterative reconstruction technique. COMPARISON:  Ultrasound earlier today and CT abdomen and pelvis 07/18/2022 FINDINGS: Lower chest: Moderate right and small left pleural effusions. Fluid tracks into the right major fissure. Associated atelectasis/infiltrates. Partially visualized CVC. Coronary artery calcification. Hepatobiliary: Respiratory motion obscures portions of the upper abdomen. Unremarkable noncontrast appearance of the liver. No biliary dilation. No cholelithiasis. Pancreas: Unremarkable.  No pancreatic ductal dilatation or surrounding inflammatory changes. Spleen: Normal in size without focal abnormality. Adrenals/Urinary Tract: Adrenal glands are unremarkable. Kidneys are normal, without renal calculi, focal lesion, or hydronephrosis. Stomach/Bowel: Normal caliber large and small bowel were visualized. Moderate stool burden in the visualized portions of the ascending and transverse colon. Vascular/Lymphatic: Aortic atherosclerosis. No enlarged abdominal or pelvic lymph nodes. Other: No free intraperitoneal air. Musculoskeletal: Expansile lytic lesion and soft tissue mass in the anterior right sixth rib. The mass measures 6.2 by 8.3 cm and causes mass effect on the right hepatic lobe.  Additional partially visualized expansile lytic mass in the left iliac wing. Multiple bilateral subacute/chronic rib fractures and depressed sternal fracture. Predominantly lytic lesions throughout the thoracolumbar spine with multiple vertebral body compression deformities similar to 07/18/2022. IMPRESSION: Unremarkable noncontrast appearance of the kidneys. No hydronephrosis or visualized obstructing urinary calculi. Progression of diffuse osseous lytic lesions in the axial and appendicular skeleton compatible with multiple myeloma. This includes increased size of the expansile lytic lesion with soft tissue mass in the anterior right sixth rib and left iliac wing. Multiple pathologic vertebral body compression fractures are similar to 07/18/2022. Moderate colonic stool burden. Moderate right and small left pleural effusions. Electronically Signed   By: Placido Sou M.D.   On: 10/19/2022 20:01   US RENAL  Result Date: 10/19/2022 CLINICAL DATA:  AKI. Per technologist note, suboptimal images due to patient breathing an overlying bowel gas. EXAM: RENAL / URINARY TRACT ULTRASOUND COMPLETE COMPARISON:  CT abdomen and pelvis July 18, 2022 FINDINGS: Right Kidney: Renal measurements: 8.6 x 3.9 x 4.8 cm = volume: 84.6 mL. Echogenicity within normal limits. No mass or hydronephrosis visualized. Left Kidney: Renal measurements: 9.9 x 6.1 x 5.0 cm = volume: 158.5 mL. Echogenicity within normal limits. No mass or hydronephrosis visualized. Bladder: Decompressed with Foley catheter in place. Other: None. IMPRESSION: No acute findings. Electronically Signed   By: Beryle Flock M.D.   On: 10/19/2022 17:26    Chlorhexidine Gluconate Cloth  6 each Topical QHS   heparin  5,000 Units Subcutaneous Q8H   lactulose  20 g Oral BID   levothyroxine  88 mcg Oral QAC breakfast   megestrol  400 mg Oral BID   metoprolol tartrate  12.5 mg Oral BID   morphine  15 mg Oral Q12H   senna-docusate  2 tablet Oral BID     BMET    Component Value Date/Time   NA 139 10/21/2022 0529   K 4.9 10/21/2022 0529   CL 109 10/21/2022 0529   CO2 19 (L) 10/21/2022 0529   GLUCOSE 82 10/21/2022 0529   BUN 75 (H) 10/21/2022 0529   CREATININE 6.83 (H) 10/21/2022 0529   CREATININE 2.22 (H) 10/11/2022 0848   CALCIUM 9.4 10/21/2022 0529   GFRNONAA 6 (L) 10/21/2022 0529   GFRNONAA 24 (L) 10/11/2022 0848   GFRAA 51 (L) 07/12/2015 1942   CBC    Component Value Date/Time   WBC 6.7 10/21/2022 0529   RBC 3.21 (L) 10/21/2022 0529   HGB 8.9 (L) 10/21/2022 0529   HGB 10.7 (L) 10/11/2022 0848   HCT 27.7 (L) 10/21/2022 0529   PLT 174 10/21/2022 0529   PLT 246 10/11/2022 0848   MCV 86.3 10/21/2022 0529   MCH 27.7 10/21/2022 0529   MCHC 32.1 10/21/2022 0529   RDW 16.1 (H) 10/21/2022 0529   LYMPHSABS 1.2 10/11/2022 0848   MONOABS 3.5 (H) 10/11/2022 0848   EOSABS 0.1 10/11/2022 0848   BASOSABS 0.0  10/11/2022 0848    Assessment/ Plan: AKI on CKD 3b - b/l creat 1.53- 2.26 from sept - nov 2023, eGFR 29- 38 ml/min. Creat here 7.7 on admission in setting of multiple myeloma w/ high LC's. UA  negative. CT abd noncontrast showed no GU obstruction. Suspect AKI / CKD due to myeloma kidney. IVF"s dc'd, agree, she is eating and euvolemic on exam. Treatment is chemoRx. Per oncology is starting new chemoRx with cytoxan and carfilzomib, given 12/16. Scr improved since admission.  Good UOP over the last 24 hours.  No uremic issues at this time. Will follow.   Avoid nephrotoxic medications including NSAIDs and iodinated intravenous contrast exposure unless the latter is absolutely indicated.   Preferred narcotic agents for pain control are hydromorphone, fentanyl, and methadone. Morphine should not be used.  Avoid Baclofen and avoid oral sodium phosphate and magnesium citrate based laxatives / bowel preps.  Continue strict Input and Output monitoring. Will monitor the patient closely with you and intervene or adjust therapy as indicated  by changes in clinical status/labs  L humerus fracture - pathologic due to MM.  Ortho to see today. Multiple myeloma - diagnosed June 2023 w/ extensive spinal disease. Recent admit showed malignant R pleural effusion. F/b Dr Marin Olp [place] - pt has sig complications from her aggressive cancer/ myeloma and now impending end-stage kidney failure is a real possibility. Have d/w pt briefly. Palliative care team to be consulted for [place].   Donetta Potts, MD [facility]

## 2022-10-21 NOTE — Progress Notes (Signed)
Occupational Therapy Treatment Patient Details Name: Michelle [name] MRN: [id number] DOB: 03-27-1961 Today's Date: 10/21/2022   History of present illness Patient is a 61 year old female who initially presented to radiation oncology at the cancer center with complaints of generalized weakness, left arm pain, and vomiting.   In the ED, workup revealed left humerus fracture, AKI and sinus tachycardia.  EDP discussed the case with orthopedic surgery, Dr. Marlou Sa.  Orthopedic surgery recommended outpatient follow-up on Monday 10/18/2022.  No plan for surgical intervention at this time.PMH: CKD 3B, multiple myeloma and malignant pleural effusion status post talc pleurodesis, status post radiation therapy to the skull base, treated at cancer center,   OT comments  Patient is motivated to participate in session and get better at participation in time OOB. Patient was noted to continue to be limited by fatigue and pain during session. Nurse made aware and administered pain medications at end of session. Patient would continue to benefit from skilled OT services at this time while admitted and after d/c to address noted deficits in order to improve overall safety and independence in ADLs. Patient's discharge plan remains appropriate at this time. OT will continue to follow acutely.     Recommendations for follow up therapy are one component of a multi-disciplinary discharge planning process, led by the attending physician.  Recommendations may be updated based on patient status, additional functional criteria and insurance authorization.    Follow Up Recommendations  Home health OT     Assistance Recommended at Discharge Frequent or constant Supervision/Assistance  Patient can return home with the following  A little help with walking and/or transfers;A lot of help with bathing/dressing/bathroom;Assistance with cooking/housework;Direct supervision/assist for financial management;Assist for transportation;Help  with stairs or ramp for entrance;Direct supervision/assist for medications management   Equipment Recommendations  None recommended by OT    Recommendations for Other Services      Precautions / Restrictions Precautions Precautions: Fall Precaution Comments: back pain, METS Required Braces or Orthoses: Sling Restrictions Weight Bearing Restrictions: Yes LUE Weight Bearing: Non weight bearing Other Position/Activity Restrictions: no formal orders however pt with humeral fx, maintained NWB L UE with sling in place for mobility       Mobility Bed Mobility Overal bed mobility: Needs Assistance Bed Mobility: Supine to Sit     Supine to sit: Mod assist     General bed mobility comments: with cues to roll to side with physical A to lift LLE to put into place to assist with rolling tasks    Transfers                         Balance Overall balance assessment: Mild deficits observed, not formally tested                                         ADL either performed or assessed with clinical judgement   ADL Overall ADL's : Needs assistance/impaired                                       General ADL Comments: Patient was educated on proper positioning of sling with hand even with or higher than elow if possible. patients sling was adjsuted to reduce depend positioning of L hand. patient was educated on pushing  through RUE to offload pressure to advance to recliner in room with increased time and cues for sequencing. patient was provided with hand out on conservative shoulder precautions but unable to complete education as patient had pallative meeting scheduled for this afternoon.    Extremity/Trunk Assessment              Vision       Perception     Praxis      Cognition Arousal/Alertness: Awake/alert Behavior During Therapy: WFL for tasks assessed/performed Overall Cognitive Status: Impaired/Different from baseline                                  General Comments: patient noted to need repeated step by step cues. patient endorsed that her medication "make me feel like not myself".        Exercises      Shoulder Instructions       General Comments      Pertinent Vitals/ Pain       Pain Assessment Pain Assessment: 0-10 Faces Pain Scale: Hurts whole lot Pain Location: left arm Pain Descriptors / Indicators: Grimacing, Discomfort, Sore, Guarding Pain Intervention(s): Limited activity within patient's tolerance, Monitored during session, Repositioned, Patient requesting pain meds-RN notified  Home Living                                          Prior Functioning/Environment              Frequency  Min 2X/week        Progress Toward Goals  OT Goals(current goals can now be found in the care plan section)  Progress towards OT goals: Progressing toward goals     Plan Discharge plan remains appropriate    Co-evaluation                 AM-PAC OT "6 Clicks" Daily Activity     Outcome Measure   Help from another person eating meals?: A Little Help from another person taking care of personal grooming?: A Little Help from another person toileting, which includes using toliet, bedpan, or urinal?: A Lot Help from another person bathing (including washing, rinsing, drying)?: A Lot Help from another person to put on and taking off regular upper body clothing?: A Lot Help from another person to put on and taking off regular lower body clothing?: A Lot 6 Click Score: 14    End of Session Equipment Utilized During Treatment: Gait belt  OT Visit Diagnosis: Unsteadiness on feet (R26.81);Pain Pain - Right/Left: Left Pain - part of body: Shoulder;Arm;Hand   Activity Tolerance Patient limited by pain   Patient Left in chair;with call bell/phone within reach;with family/visitor present;with chair alarm set   Nurse Communication Mobility status;Patient  requests pain meds        Time: 0623-7628 OT Time Calculation (min): 48 min  Charges: OT General Charges $OT Visit: 1 Visit OT Treatments $Self Care/Home Management : 38-52 mins  Rennie Plowman, MS Acute Rehabilitation Department Office# [phone]   Willa Rough 10/21/2022, 3:47 PM

## 2022-10-21 NOTE — Progress Notes (Signed)
[facility]                                                                               Michelle [name], is a 61 y.o. female, DOB - [birth year], TMB:[id number] Admit date - 10/17/2022    Outpatient Primary MD for the patient is Leeroy Cha, MD  LOS - 4  days    Brief summary    Michelle [name] is a 61 y.o. female with medical history significant for CKD 3B, multiple myeloma and malignant pleural effusion status post talc pleurodesis, status post radiation therapy to the skull base, treated at cancer center, who initially presented to radiation oncology at the cancer center with complaints of generalized weakness, left arm pain, and vomiting. . X-rays were done. The patient was informed she had a humerus fracture and was advised to go to the ED for further management.  In the ED, workup revealed left humerus fracture, AKI and sinus tachycardia.  EDP discussed the case with orthopedic surgery, Dr. Marlou Sa.  Orthopedic surgery recommended outpatient follow-up on Monday 10/18/2022.  No plan for surgical intervention at this time.  The patient prefers to be admitted at [facility] rather than [facility].  Admitted by [facility], hospitalist service.    Assessment & Plan    Assessment and Plan:   AKI;  Superimposed on Stage 3 b CKD in the setting of MM and poor  oral intake.  Probably secondary to a combination of dehydration and hypotension and myeloma.  US renal ordered does nt show any hydronephrosis.   Creatinine on admission at 7.7, baseline creatinine at 2.2. gentle hydrated and repeat renal parameters show only slight improvement to 6.96 to 6.76. to 6.83 . Her phos is 5.3  Continue with strict intake and output.  Nephrology on board and appreciate recommendations.  She is approaching ESRD.   Multiple Myeloma:  She has light chain myeloma.  Diagnosed in June 2023 with extensive spinal disease.  Dr Marin Olp on board and she complted cytoxan and carfilzomib.      Malignant right pleural effusion:  CXR shows  Right pleural effusion has progressed. Pleural based density right upper lobe appears slightly larger. She is on RA and does not appear to be symptomatic from the pleural effusion.  Appears comfortable.    Let humerus fracture:  - continue with splint.  Pain control with MS contin, along with oxycodone and dilaudid IV.  - physical therapy evaluation ordered.  - orthopedics Dr Marlou Sa from [facility] consulted.    Sinus tachycardia:  Much improved.  - continue with lopressor 12.5 mg BID. Marland Kitchen    Hypothyroidism; Resume synthroid 88 mcg daily.      Anemia of chronic disease: -hemoglobin dropped to 8.4.  Transfuse to keep hemoglobin greater than 7.   In view of her aggressive MM, pathological fracture, AKI, palliative care consulted for goals of care. Appreciate recommendations.    Estimated body mass index is 30.79 kg/m as calculated from the following:   Height as of this encounter: 5' 4.25" (1.632 m).   Weight as of this encounter: 82 kg.  Code Status: full code.  DVT Prophylaxis:  heparin injection 5,000 Units Start: 10/17/22  2200   Level of Care: Level of care: Med-Surg Family Communication: daughter at bedside.   Disposition Plan:     Remains inpatient appropriate:  chemo and AKI.   Procedures:  US renal.   Consultants:   Nephrology Oncology.   Antimicrobials:   Anti-infectives (From admission, onward)    None        Medications  Scheduled Meds:  Chlorhexidine Gluconate Cloth  6 each Topical QHS   heparin  5,000 Units Subcutaneous Q8H   lactulose  20 g Oral BID   levothyroxine  88 mcg Oral QAC breakfast   megestrol  400 mg Oral BID   metoprolol tartrate  12.5 mg Oral BID   morphine  15 mg Oral Q12H   senna-docusate  2 tablet Oral BID   Continuous Infusions:  ondansetron     PRN Meds:.acetaminophen, influenza vac split quadrivalent PF, melatonin, pneumococcal 23 valent vaccine,  prochlorperazine    Subjective:   Michelle [name] was seen and examined today.  No new complaints. Reports left groin pain.  Asterixis.   Objective:   Vitals:   10/20/22 2033 10/20/22 2110 10/21/22 0524 10/21/22 1541  BP: 131/80  125/78 139/88  Pulse: (!) 108  (!) 108 (!) 109  Resp: _0 Temp: 98.2 F (36.8 C)  97.7 F (36.5 C) 97.9 F (36.6 C)  TempSrc: Oral  Oral Oral  SpO2: 99%  99% 98%  Weight:  81.6 kg 82 kg   Height:  5' 4.25" (1.632 m)      Intake/Output Summary (Last 24 hours) at 10/21/2022 1745 Last data filed at 10/21/2022 0500 Gross per 24 hour  Intake 290 ml  Output 800 ml  Net -510 ml    Filed Weights   10/20/22 0500 10/20/22 2110 10/21/22 0524  Weight: 79 kg 81.6 kg 82 kg     Exam General exam:Ill appearing lady, slightly confused.  Respiratory system: Clear to auscultation. Respiratory effort normal. Cardiovascular system: S1 & S2 heard, RRR. No JVD,  No pedal edema. Gastrointestinal system: Abdomen is nondistended, soft and nontender.  Central nervous system: Alert and oriented to place and person. Asterixis is improving.  Extremities: left arm in sling.  Skin: No rashes,  Psychiatry:  Mood & affect appropriate.         Data Reviewed:  I have personally reviewed following labs and imaging studies   CBC Lab Results  Component Value Date   WBC 6.7 10/21/2022   RBC 3.21 (L) 10/21/2022   HGB 8.9 (L) 10/21/2022   HCT 27.7 (L) 10/21/2022   MCV 86.3 10/21/2022   MCH 27.7 10/21/2022   PLT 174 10/21/2022   MCHC 32.1 10/21/2022   RDW 16.1 (H) 10/21/2022   LYMPHSABS 1.2 10/11/2022   MONOABS 3.5 (H) 10/11/2022   EOSABS 0.1 10/11/2022   BASOSABS 0.0 97/12/6376     Last metabolic panel Lab Results  Component Value Date   NA 139 10/21/2022   K 4.9 10/21/2022   CL 109 10/21/2022   CO2 19 (L) 10/21/2022   BUN 75 (H) 10/21/2022   CREATININE 6.83 (H) 10/21/2022   GLUCOSE 82 10/21/2022   GFRNONAA 6 (L) 10/21/2022   GFRAA 51 (L)  07/12/2015   CALCIUM 9.4 10/21/2022   PHOS 5.3 (H) 10/21/2022   PROT 6.1 (L) 10/21/2022   ALBUMIN 3.0 (L) 10/21/2022   LABGLOB 2.9 10/11/2022   AGRATIO 1.1 10/11/2022   BILITOT 0.5 10/21/2022   ALKPHOS 66 10/21/2022   AST 23 10/21/2022  ALT 14 10/21/2022   ANIONGAP 11 10/21/2022    CBG (last 3)  No results for input(s): "GLUCAP" in the last 72 hours.    Coagulation Profile: No results for input(s): "INR", "PROTIME" in the last 168 hours.   Radiology Studies: CT Abd Limited W/O Cm  Result Date: 10/19/2022 CLINICAL DATA:  Acute kidney failure. Suboptimal renal ultrasound due to immobility and bowel-gas EXAM: CT ABDOMEN WITHOUT CONTRAST LIMITED TECHNIQUE: Multidetector CT imaging of the abdomen was performed following the standard protocol without IV contrast. RADIATION DOSE REDUCTION: This exam was performed according to the departmental dose-optimization program which includes automated exposure control, adjustment of the mA and/or kV according to patient size and/or use of iterative reconstruction technique. COMPARISON:  Ultrasound earlier today and CT abdomen and pelvis 07/18/2022 FINDINGS: Lower chest: Moderate right and small left pleural effusions. Fluid tracks into the right major fissure. Associated atelectasis/infiltrates. Partially visualized CVC. Coronary artery calcification. Hepatobiliary: Respiratory motion obscures portions of the upper abdomen. Unremarkable noncontrast appearance of the liver. No biliary dilation. No cholelithiasis. Pancreas: Unremarkable. No pancreatic ductal dilatation or surrounding inflammatory changes. Spleen: Normal in size without focal abnormality. Adrenals/Urinary Tract: Adrenal glands are unremarkable. Kidneys are normal, without renal calculi, focal lesion, or hydronephrosis. Stomach/Bowel: Normal caliber large and small bowel were visualized. Moderate stool burden in the visualized portions of the ascending and transverse colon.  Vascular/Lymphatic: Aortic atherosclerosis. No enlarged abdominal or pelvic lymph nodes. Other: No free intraperitoneal air. Musculoskeletal: Expansile lytic lesion and soft tissue mass in the anterior right sixth rib. The mass measures 6.2 by 8.3 cm and causes mass effect on the right hepatic lobe. Additional partially visualized expansile lytic mass in the left iliac wing. Multiple bilateral subacute/chronic rib fractures and depressed sternal fracture. Predominantly lytic lesions throughout the thoracolumbar spine with multiple vertebral body compression deformities similar to 07/18/2022. IMPRESSION: Unremarkable noncontrast appearance of the kidneys. No hydronephrosis or visualized obstructing urinary calculi. Progression of diffuse osseous lytic lesions in the axial and appendicular skeleton compatible with multiple myeloma. This includes increased size of the expansile lytic lesion with soft tissue mass in the anterior right sixth rib and left iliac wing. Multiple pathologic vertebral body compression fractures are similar to 07/18/2022. Moderate colonic stool burden. Moderate right and small left pleural effusions. Electronically Signed   By: Placido Sou M.D.   On: 10/19/2022 20:01       Hosie Poisson M.D. [facility] 10/21/2022, 5:45 PM  Available via Epic secure chat 7am-7pm After 7 pm, please refer to night coverage provider listed on amion.

## 2022-10-21 NOTE — Telephone Encounter (Signed)
The hospital called and would like a consult for this patient they were suppose to come into the office today but she is still admitted.  L humerus Fx  Patient room is 1603

## 2022-10-21 NOTE — Progress Notes (Addendum)
   10/21/22 1100  Clinical Encounter Type  Visited With Patient;Family  Visit Type Initial;Psychological support;Spiritual support;Social support  Referral From Physician;Palliative care team  Consult/Referral To Chaplain (AD needs notarization)  Spiritual Encounters  Spiritual Needs Emotional   CH visited pt. per consult from palliative care team to assist with advance directive.  Pt. lying in bed awake with dtr. [name] at bedside.  Pt. confirms she would like to update her HCPOA/Living Will.  CH provided education re: contents od AD packet and assisted pt. in completing it.  Pt. indicates she would like her daughters Bethann Berkshire and [name] to be her primary and alternative HCPOAs, respectively.  In completing Living Will, pt. expressed that she would want the medical team to continue attempting life-prolonging treatments in the three scenarios described; she also instructs her daughters to "Do everything you can to keep me alive" in the special instructions section of the first page.  Pt. gives discretion to her HCPOAs to make decisions re: her medical care, reflecting aloud that her daughters know her wishes and she knows they will collaborate to make the best decision they can.  Pt. shared that her Darrick Meigs faith has been a key source of strength and meaning in the midst of her cancer diagnosis and health complications related to this.  She shared that she believes God has a plan for her in the midst of her sickness.  AD is ready for notarization and this Pryor Curia will attempt to coordinate this for later today.  Lindaann Pascal PRN Chaplain Pager: [phone]

## 2022-10-22 ENCOUNTER — Inpatient Hospital Stay (HOSPITAL_COMMUNITY): Payer: BC Managed Care – PPO

## 2022-10-22 ENCOUNTER — Encounter (HOSPITAL_COMMUNITY): Payer: Self-pay | Admitting: Internal Medicine

## 2022-10-22 DIAGNOSIS — N179 Acute kidney failure, unspecified: Secondary | ICD-10-CM | POA: Diagnosis not present

## 2022-10-22 DIAGNOSIS — Z01810 Encounter for preprocedural cardiovascular examination: Secondary | ICD-10-CM | POA: Diagnosis not present

## 2022-10-22 DIAGNOSIS — E860 Dehydration: Secondary | ICD-10-CM | POA: Diagnosis not present

## 2022-10-22 DIAGNOSIS — I2510 Atherosclerotic heart disease of native coronary artery without angina pectoris: Secondary | ICD-10-CM

## 2022-10-22 DIAGNOSIS — D464 Refractory anemia, unspecified: Secondary | ICD-10-CM | POA: Diagnosis not present

## 2022-10-22 DIAGNOSIS — C9000 Multiple myeloma not having achieved remission: Secondary | ICD-10-CM | POA: Diagnosis not present

## 2022-10-22 LAB — COMPREHENSIVE METABOLIC PANEL WITH GFR
ALT: 14 U/L (ref 0–44)
AST: 22 U/L (ref 15–41)
Albumin: 2.8 g/dL — ABNORMAL LOW (ref 3.5–5.0)
Alkaline Phosphatase: 57 U/L (ref 38–126)
Anion gap: 9 (ref 5–15)
BUN: 74 mg/dL — ABNORMAL HIGH (ref 8–23)
CO2: 19 mmol/L — ABNORMAL LOW (ref 22–32)
Calcium: 9.9 mg/dL (ref 8.9–10.3)
Chloride: 113 mmol/L — ABNORMAL HIGH (ref 98–111)
Creatinine, Ser: 6.99 mg/dL — ABNORMAL HIGH (ref 0.44–1.00)
GFR, Estimated: 6 mL/min — ABNORMAL LOW
Glucose, Bld: 88 mg/dL (ref 70–99)
Potassium: 4.6 mmol/L (ref 3.5–5.1)
Sodium: 141 mmol/L (ref 135–145)
Total Bilirubin: 0.4 mg/dL (ref 0.3–1.2)
Total Protein: 5.8 g/dL — ABNORMAL LOW (ref 6.5–8.1)

## 2022-10-22 LAB — CBC
HCT: 27.7 % — ABNORMAL LOW (ref 36.0–46.0)
Hemoglobin: 8.9 g/dL — ABNORMAL LOW (ref 12.0–15.0)
MCH: 27.7 pg (ref 26.0–34.0)
MCHC: 32.1 g/dL (ref 30.0–36.0)
MCV: 86.3 fL (ref 80.0–100.0)
Platelets: 179 10*3/uL (ref 150–400)
RBC: 3.21 MIL/uL — ABNORMAL LOW (ref 3.87–5.11)
RDW: 16.2 % — ABNORMAL HIGH (ref 11.5–15.5)
WBC: 8.3 10*3/uL (ref 4.0–10.5)
nRBC: 0.2 % (ref 0.0–0.2)

## 2022-10-22 LAB — UPEP/UIFE/LIGHT CHAINS/TP, 24-HR UR
% BETA, Urine: 77.8 %
ALPHA 1 URINE: 1.6 %
Albumin, U: 5.4 %
Alpha 2, Urine: 13.8 %
Free Kappa Lt Chains,Ur: 2636.72 mg/L — ABNORMAL HIGH (ref 1.17–86.46)
Free Kappa/Lambda Ratio: 826.56 — ABNORMAL HIGH (ref 1.83–14.26)
Free Lambda Lt Chains,Ur: 3.19 mg/L (ref 0.27–15.21)
GAMMA GLOBULIN URINE: 1.4 %
M-SPIKE %, Urine: 68 % — ABNORMAL HIGH
M-Spike, Mg/24 Hr: 902 mg/24 hr — ABNORMAL HIGH
Total Protein, Urine-Ur/day: 1326 mg/24 hr — ABNORMAL HIGH (ref 30–150)
Total Protein, Urine: 88.4 mg/dL
Total Volume: 1500

## 2022-10-22 LAB — MAGNESIUM: Magnesium: 2.4 mg/dL (ref 1.7–2.4)

## 2022-10-22 LAB — KAPPA/LAMBDA LIGHT CHAINS
Kappa free light chain: 2901.4 mg/L — ABNORMAL HIGH (ref 3.3–19.4)
Kappa, lambda light chain ratio: 743.95 — ABNORMAL HIGH (ref 0.26–1.65)
Lambda free light chains: 3.9 mg/L — ABNORMAL LOW (ref 5.7–26.3)

## 2022-10-22 LAB — PHOSPHORUS: Phosphorus: 5.4 mg/dL — ABNORMAL HIGH (ref 2.5–4.6)

## 2022-10-22 MED ORDER — ROSUVASTATIN CALCIUM 10 MG PO TABS
10.0000 mg | ORAL_TABLET | Freq: Every day | ORAL | Status: DC
  Administered 2022-10-22 – 2022-10-31 (×10): 10 mg via ORAL
  Filled 2022-10-22 (×10): qty 1

## 2022-10-22 MED ORDER — SODIUM CHLORIDE 0.9 % IV SOLN: INTRAVENOUS | Status: DC

## 2022-10-22 MED ORDER — HEPARIN SODIUM (PORCINE) 5000 UNIT/ML IJ SOLN
5000.0000 [IU] | Freq: Three times a day (TID) | INTRAMUSCULAR | Status: DC
  Administered 2022-10-22 – 2022-10-31 (×27): 5000 [IU] via SUBCUTANEOUS
  Filled 2022-10-22 (×26): qty 1

## 2022-10-22 NOTE — Consult Note (Signed)
ORTHOPAEDIC CONSULTATION  REQUESTING PHYSICIAN: Hosie Poisson, MD  Chief Complaint: "My left arm hurts"  HPI: Michelle [name] is a 61 y.o. female who presents with left arm pain.  Pain began several days ago when she felt sudden onset of severe pain while not performing any vigorous physical activity or falling.  She states she thought she just pulled something initially but began to have severe pain.  She had x-rays demonstrating midshaft pathologic humerus fracture with minimal displacement.  She denies any history of prior left arm surgery.  She does have history of multiple myeloma and is currently hospitalized for acute kidney injury with creatinine up to around 7 from baseline of 2.2.  She denies any other joint pains except she does report 2 weeks of groin pain in the left leg that is causing difficulty with bearing weight on the lower extremity.  She did not really have any sort of fall or major injury leading to onset of pain similar to her left arm.  She has been limited in physical therapy by his left leg to some degree.  Past Medical History:  Diagnosis Date   Anemia of chronic renal failure, stage 3b ([place]) 08/02/2022   Cancer ([place])    Essential hypertension 02/16/2017   Hyperlipemia    Hyperlipidemia 02/16/2017   Hypertension    Hypothyroidism 02/16/2017   Morbid obesity ([place]) 02/16/2017   Multiple myeloma without remission ([place]) 06/20/2022   Past Surgical History:  Procedure Laterality Date   IR IMAGING GUIDED PORT INSERTION  06/17/2022   IR THORACENTESIS ASP PLEURAL SPACE W/IMG GUIDE  06/17/2022   THYROIDECTOMY     Social History   Socioeconomic History   Marital status: Widowed    Spouse name: Not on file   Number of children: Not on file   Years of education: Not on file   Highest education level: Not on file  Occupational History   Not on file  Tobacco Use   Smoking status: Former    Packs/day: 0.25    Years: 30.00    Total pack years: 7.50    Types: Cigarettes     Quit date: 45    Years since quitting: 33.9   Smokeless tobacco: Never  Vaping Use   Vaping Use: Never used  Substance and Sexual Activity   Alcohol use: No   Drug use: No   Sexual activity: Not Currently    Birth control/protection: Post-menopausal  Other Topics Concern   Not on file  Social History Narrative   Not on file   Social Determinants of Health   Financial Resource Strain: Not on file  Food Insecurity: No Food Insecurity (10/18/2022)   Hunger Vital Sign    Worried About Running Out of Food in the Last Year: Never true    Ran Out of Food in the Last Year: Never true  Transportation Needs: No Transportation Needs (10/18/2022)   PRAPARE - Hydrologist (Medical): No    Lack of Transportation (Non-Medical): No  Physical Activity: Not on file  Stress: Not on file  Social Connections: Not on file   Family History  Problem Relation Age of Onset   Diabetes Paternal Grandfather    Stroke Paternal Grandfather    Heart attack Paternal Grandfather    Diabetes Father    Hypertension Father    Stroke Father    Lung cancer Father    Hypertension Mother    Kidney disease Mother    Hypertension Sister  Stroke Paternal Grandmother    Heart attack Maternal Grandmother    - negative except otherwise stated in the family history section Allergies  Allergen Reactions   Penicillins Other (See Comments)    Severe Yeast infection... No deathly reactions   Codeine Nausea Only   Morphine Other (See Comments)    headache   Tramadol Other (See Comments)    Headaches    Prior to Admission medications   Medication Sig Start Date End Date Taking? Authorizing Provider  famciclovir (FAMVIR) 500 MG tablet Take 1 tablet (500 mg total) by mouth daily. 10/11/22  Yes Volanda Napoleon, MD  fluconazole (DIFLUCAN) 100 MG tablet Take 100 mg by mouth daily. 07/28/22  Yes [provider]  lactulose (CHRONULAC) 10 GM/15ML solution TAKE 15 MLS (10 G  TOTAL) BY MOUTH 2 (TWO) TIMES DAILY. 10/15/22  Yes Volanda Napoleon, MD  levothyroxine (SYNTHROID) 88 MCG tablet Take 88 mcg by mouth daily before breakfast.   Yes [provider]  Multiple Vitamin (MULTIVITAMIN WITH MINERALS) TABS tablet Take 1 tablet by mouth daily. 06/27/22  Yes Hongalgi, Lenis Dickinson, MD  ondansetron (ZOFRAN) 8 MG tablet Take 1 tablet (8 mg total) by mouth every 8 (eight) hours as needed for nausea or vomiting. 10/10/22  Yes Ennever, Rudell Cobb, MD  Oxycodone HCl 10 MG TABS Take 1 tablet (10 mg total) by mouth every 4 (four) hours as needed. Patient taking differently: Take 10 mg by mouth every 4 (four) hours as needed (for pain). 09/13/22  Yes Volanda Napoleon, MD  pantoprazole (PROTONIX) 40 MG tablet Take 1 tablet (40 mg total) by mouth 2 (two) times daily. 10/04/22  Yes Volanda Napoleon, MD  prochlorperazine (COMPAZINE) 10 MG tablet Take 1 tablet (10 mg total) by mouth every 6 (six) hours as needed for nausea or vomiting. 06/14/22  Yes Tyler Pita, MD  rosuvastatin (CRESTOR) 10 MG tablet Take 10 mg by mouth every evening.   Yes [provider]  senna (SENOKOT) 8.6 MG TABS tablet Take 1 tablet (8.6 mg total) by mouth daily as needed for mild constipation or moderate constipation. 06/26/22  Yes Hongalgi, Lenis Dickinson, MD  diazepam (VALIUM) 5 MG tablet Take thirty minutes before MRI for anxiety. Patient taking differently: Take 5 mg by mouth daily as needed (Take thirty minutes before MRI for anxiety). 09/12/22   Volanda Napoleon, MD  metoprolol tartrate (LOPRESSOR) 25 MG tablet Take 1 tablet (25 mg total) by mouth 2 (two) times daily. Patient not taking: Reported on 10/18/2022 06/26/22   Modena Jansky, MD  OLANZapine (ZYPREXA) 5 MG tablet Take 1 tablet (5 mg total) by mouth at bedtime. Patient not taking: Reported on 10/18/2022 10/14/22   Volanda Napoleon, MD   No results found. - pertinent xrays, CT, MRI studies were reviewed and independently interpreted  Positive  ROS: All other systems have been reviewed and were otherwise negative with the exception of those mentioned in the HPI and as above.  Physical Exam: General: Alert, no acute distress Psychiatric: Patient is competent for consent with normal mood and affect Lymphatic: No axillary or cervical lymphadenopathy Cardiovascular: No pedal edema Respiratory: No cyanosis, no use of accessory musculature GI: No organomegaly, abdomen is soft and non-tender    Images:  _0 @  Labs:  Lab Results  Component Value Date   HGBA1C 5.5 05/09/2010   HGBA1C 6.1 12/11/2009   REPTSTATUS 10/02/2022 FINAL 10/01/2022   GRAMSTAIN  06/17/2022    ABUNDANT WBC PRESENT, PREDOMINANTLY  MONONUCLEAR NO ORGANISMS SEEN    CULT MULTIPLE SPECIES PRESENT, SUGGEST RECOLLECTION (A) 10/01/2022    Lab Results  Component Value Date   ALBUMIN 2.8 (L) 10/22/2022   ALBUMIN 3.0 (L) 10/21/2022   ALBUMIN 2.7 (L) 10/20/2022    Neurologic: Patient does not have protective sensation bilateral lower extremities.   MUSCULOSKELETAL:   Ortho exam demonstrates left arm with palpable radial pulse rated 2+.  Intact EPL, FPL, finger abduction, wrist extension of the left upper extremity.  Left arm currently in splint.  She has intact dorsiflexion and plantarflexion.  No tenderness throughout the left ankle, tibial shaft, knee.  There is no knee effusion present.  No ecchymosis noted throughout the left lower extremity.  She has no pain with logroll.  There is no shortening of the left lower extremity with external rotation.  She does not really have any reproducible pain with passive motion of the left hip.  Assessment: Pathologic fracture of midshaft left humerus  Plan: Patient evaluated earlier this morning.  Anticipate surgical intervention likely Thursday as we give time for this AKI to continue to resolve; reached out to Dr. Karleen Hampshire for her thoughts on feasibility of surgery earlier.  We will plan for intramedullary nail  placement into the humerus.  Radial nerve is intact on exam today with palpable radial pulse of the injured upper extremity.  She does have some groin pain that she has noticed over the last 2 weeks particularly with weightbearing.  This is limited to her physical therapy based on therapy notes that were reviewed.  Does not really have any reproducible hip pain with moving her hip around on exam but plan to order radiographs of the left hip for further evaluation of occult fracture versus pathologic lesion.  She is okay to continue with subcutaneous heparin for now but we will discontinue this by the surgery on Thursday.  Thank you for the consult and the opportunity to see Ms. Marty [phone] 9:39 AM

## 2022-10-22 NOTE — Progress Notes (Signed)
Chaplain completed documentation and notarization of Advanced Directive, Healthcare POA.  Chaplain provided three copies, including the original.  Chaplain scanned a cop to ACP ([place]) to be uploaded in her medical chart.    Chaplain offered education, support, and organized notary and witnesses.     10/22/22 1400  Clinical Encounter Type  Visited With Patient and family together  Visit Type Social support  Spiritual Encounters  Spiritual Needs Brochure;Literature

## 2022-10-22 NOTE — Telephone Encounter (Signed)
Michelle [name] has evaluated

## 2022-10-22 NOTE — Progress Notes (Signed)
Patient has bilateral proximal femur metastatic lesions.  The left one is more unstable than the right.  Patient has groin pain on the left but not on the right.  In order for her to safely ambulate fixation would be required on the left and would be helpful to do on the right at the same time if the patient elects to proceed with that.  Without fixation she is at risk of pathologic fracture with ambulation particularly on the left-hand side.  Discussed with Dr. Karleen Hampshire the current situation and let us know if patient wants to proceed with fixation on the lower extremities in order to allow for safer ambulation moving forward.

## 2022-10-22 NOTE — Consult Note (Addendum)
Cardiology Consultation   Michelle [name] ID: Michelle Michelle [name] MRN: [id number]; DOB: Jan 10, 1961  Admit date: 10/17/2022 Date of Consult: 10/22/2022  PCP:  Michelle Michelle [name], Michelle Michelle [name] Providers Cardiologist:  None        Michelle [name] Profile:   Michelle Michelle [name] is a 61 y.o. female with a hx of  HTN and HLD, hx thyroidectomy no CAD, has IgG kappa myeloma -now admitted   who is being seen 10/22/2022 for Michelle evaluation of pre-op eval for surgical repair of Lt humerus fx.  at Michelle request of Dr. Karleen Michelle [name]. .  History of Present Illness:   Michelle Michelle [name] with above hx and receiving therapy for her IgGa kappa myeloma and she was started 1 week ago on Faspro/Cytoxan/Kyprolis this was change in therapy, first diagnosed June 2023.  Followed by Dr. Marin [name].  Also with radiation therapy because of bony lesions and radiation therapy to brain for skull base lesions.   TTE in August 2023 with EF 65-70%, no RWMA, RV is normal. Large pl effusion in rt lateral region   She presented to ER on Michelle 14th due to crunch in left upper arm.  She had mildly displaced pathologic fracture in Michelle mid shaft.  No plan for surgery on admit but with continued pain plan for repair on Thursday.      She developed AKI with Cr gong from 2.22 to 7.72  felt due to myeloma kidney followed by nephrology here.  Felt component of of dehydration and hypotension as well.   Malignant Rt pl effusion.     Today labs Na 141, K+ 4.6 CL 113, c02 19 BUN 74 Cr 6.99 phos 5.4 Mg+ 2.4  Hgb 8.9 WBC 8.3 plts 179  Coronary calcification seen on CT abd  also Progression of diffuse osseous lytic lesions in Michelle axial and appendicular skeleton compatible with multiple myeloma. This includes increased size of Michelle expansile lytic lesion with soft tissue mass in Michelle anterior right sixth rib and left iliac wing. Multiple pathologic vertebral body compression fractures are similar to 07/18/2022.  Renal ultrasound no acute abnormalities.   2V CXR   MPRESSION: 1. Interval removal of right basilar chest tube. No pneumothorax. 2. Right pleural effusion has progressed. Pleural based density right upper lobe appears slightly larger. 3. Progressive right lower lobe airspace disease.  EKG:  Michelle EKG was personally reviewed and demonstrates:  ST with non specific T wave abnormality, no acute changes  follow up with ST and no acute changes Telemetry:  Telemetry was personally reviewed and demonstrates:  ST to SR   BP 142/89 P 109 R 18 afebrile   No chest pain and no real SOB despite Michelle pl effusion.  She does not do much activity at home now with chemo and radiation.  We discussed surgery for her arm and hips, and she was overwhelmed. Tearful. Her daughter is with her.     Past Medical History:  Diagnosis Date   Anemia of chronic renal failure, stage 3b (Michelle Michelle [name]) 08/02/2022   Cancer (Michelle Michelle [name])    Essential hypertension 02/16/2017   Hyperlipemia    Hyperlipidemia 02/16/2017   Hypertension    Hypothyroidism 02/16/2017   Morbid obesity (Michelle Michelle [name]) 02/16/2017   Multiple myeloma without remission (Michelle Michelle [name]) 06/20/2022    Past Surgical History:  Procedure Laterality Date   IR IMAGING GUIDED PORT INSERTION  06/17/2022   IR THORACENTESIS ASP PLEURAL SPACE W/IMG GUIDE  06/17/2022   THYROIDECTOMY       Home Medications:  Prior to Admission medications   Medication Sig Start Date End Date Taking? Authorizing Provider  famciclovir (FAMVIR) 500 MG tablet Take 1 tablet (500 mg total) by mouth daily. 10/11/22  Yes Michelle Napoleon, MD  fluconazole (DIFLUCAN) 100 MG tablet Take 100 mg by mouth daily. 07/28/22  Yes [provider]  lactulose (CHRONULAC) 10 GM/15ML solution TAKE 15 MLS (10 G TOTAL) BY MOUTH 2 (TWO) TIMES DAILY. 10/15/22  Yes Michelle Napoleon, MD  levothyroxine (SYNTHROID) 88 MCG tablet Take 88 mcg by mouth daily before breakfast.   Yes [provider]  Multiple Vitamin (MULTIVITAMIN WITH MINERALS) TABS tablet Take 1 tablet by mouth  daily. 06/27/22  Yes Hongalgi, Lenis Dickinson, MD  ondansetron (ZOFRAN) 8 MG tablet Take 1 tablet (8 mg total) by mouth every 8 (eight) hours as needed for nausea or vomiting. 10/10/22  Yes Ennever, Rudell Cobb, MD  Oxycodone HCl 10 MG TABS Take 1 tablet (10 mg total) by mouth every 4 (four) hours as needed. Michelle [name] taking differently: Take 10 mg by mouth every 4 (four) hours as needed (for pain). 09/13/22  Yes Michelle Napoleon, MD  pantoprazole (PROTONIX) 40 MG tablet Take 1 tablet (40 mg total) by mouth 2 (two) times daily. 10/04/22  Yes Michelle Napoleon, MD  prochlorperazine (COMPAZINE) 10 MG tablet Take 1 tablet (10 mg total) by mouth every 6 (six) hours as needed for nausea or vomiting. 06/14/22  Yes Tyler Pita, MD  rosuvastatin (CRESTOR) 10 MG tablet Take 10 mg by mouth every evening.   Yes [provider]  senna (SENOKOT) 8.6 MG TABS tablet Take 1 tablet (8.6 mg total) by mouth daily as needed for mild constipation or moderate constipation. 06/26/22  Yes Hongalgi, Lenis Dickinson, MD  diazepam (VALIUM) 5 MG tablet Take thirty minutes before MRI for anxiety. Michelle [name] taking differently: Take 5 mg by mouth daily as needed (Take thirty minutes before MRI for anxiety). 09/12/22   Michelle Napoleon, MD  metoprolol tartrate (LOPRESSOR) 25 MG tablet Take 1 tablet (25 mg total) by mouth 2 (two) times daily. Michelle [name] not taking: Reported on 10/18/2022 06/26/22   Modena Jansky, MD  OLANZapine (ZYPREXA) 5 MG tablet Take 1 tablet (5 mg total) by mouth at bedtime. Michelle [name] not taking: Reported on 10/18/2022 10/14/22   Michelle Napoleon, MD    Inpatient Medications: Scheduled Meds:  Chlorhexidine Gluconate Cloth  6 each Topical QHS   heparin  5,000 Units Subcutaneous Q8H   lactulose  20 g Oral BID   levothyroxine  88 mcg Oral QAC breakfast   megestrol  400 mg Oral BID   metoprolol tartrate  12.5 mg Oral BID   morphine  15 mg Oral Q12H   senna-docusate  2 tablet Oral BID   Continuous Infusions:  ondansetron  8 mg (10/22/22 1601)   PRN Meds: acetaminophen, influenza vac split quadrivalent PF, lip balm, melatonin, oxyCODONE, pneumococcal 23 valent vaccine, prochlorperazine  Allergies:    Allergies  Allergen Reactions   Penicillins Other (See Comments)    Severe Yeast infection... No deathly reactions   Codeine Nausea Only   Morphine Other (See Comments)    headache   Tramadol Other (See Comments)    Headaches     Social History:   Social History   Socioeconomic History   Marital status: Widowed    Spouse name: Not on file   Number of children: Not on file   Years of education: Not on file   Highest education  level: Not on file  Occupational History   Not on file  Tobacco Use   Smoking status: Former    Packs/day: 0.25    Years: 30.00    Total pack years: 7.50    Types: Cigarettes    Quit date: 75    Years since quitting: 33.9   Smokeless tobacco: Never  Vaping Use   Vaping Use: Never used  Substance and Sexual Activity   Alcohol use: No   Drug use: No   Sexual activity: Not Currently    Birth control/protection: Post-menopausal  Other Topics Concern   Not on file  Social History Narrative   Not on file   Social Determinants of Health   Financial Resource Strain: Not on file  Food Insecurity: No Food Insecurity (10/18/2022)   Hunger Vital Sign    Worried About Running Out of Food in Michelle Last Year: Never true    Ran Out of Food in Michelle Last Year: Never true  Transportation Needs: No Transportation Needs (10/18/2022)   PRAPARE - Hydrologist (Medical): No    Lack of Transportation (Non-Medical): No  Physical Activity: Not on file  Stress: Not on file  Social Connections: Not on file  Intimate Partner Violence: Not At Risk (10/18/2022)   Humiliation, Afraid, Rape, and Kick questionnaire    Fear of Current or Ex-Partner: No    Emotionally Abused: No    Physically Abused: No    Sexually Abused: No    Family History:    Family  History  Problem Relation Age of Onset   Diabetes Paternal Grandfather    Stroke Paternal Grandfather    Heart attack Paternal Grandfather    Diabetes Father    Hypertension Father    Stroke Father    Lung cancer Father    Hypertension Mother    Kidney disease Mother    Hypertension Sister    Stroke Paternal Grandmother    Heart attack Maternal Grandmother      ROS:  Please see Michelle history of present illness.  General:no colds or fevers, no weight changes Skin:no rashes or ulcers HEENT:no blurred vision, no congestion CV:see HPI PUL:see HPI GI:no diarrhea constipation or melena, no indigestion GU:no hematuria, no dysuria MS:no joint pain, no claudication + hip pain with fx as well, + fx arm undergoing radiation therapy  Neuro:no syncope, no lightheadedness Endo:no diabetes, + thyroid disease Heme:  multiple myeloma aggressive Nephrology AKI with Cr up to 7.7  now at 6.8 was  2.22 on 12/8   All other ROS reviewed and negative.     Physical Exam/Data:   Vitals:   10/21/22 0524 10/21/22 1541 10/21/22 2012 10/22/22 0559  BP: 125/78 139/88 124/76 (!) 142/89  Pulse: (!) 108 (!) 109 (!) 106 (!) 109  Resp: _0 Temp: 97.7 F (36.5 C) 97.9 F (36.6 C) 98.1 F (36.7 C) 98.1 F (36.7 C)  TempSrc: Oral Oral Oral Oral  SpO2: 99% 98% 99% 100%  Weight: 82 kg   78.8 kg  Height:        Intake/Output Summary (Last 24 hours) at 10/22/2022 1113 Last data filed at 10/22/2022 0336 Gross per 24 hour  Intake 170 ml  Output 200 ml  Net -30 ml      10/22/2022    5:59 AM 10/21/2022    5:24 AM 10/20/2022    9:10 PM  Last 3 Weights  Weight (lbs) 173 lb 11.6 oz 180 lb 12.4  oz 180 lb  Weight (kg) 78.8 kg 82 kg 81.647 kg     Body mass index is 29.59 kg/m.  General:  frail female, in no acute distress HEENT: normal Neck: no JVD Vascular: No carotid bruits; Distal pulses 2+ bilaterally Cardiac:  normal S1, S2; RRR; no murmur gallup rub or click Lungs:  clear to  auscultation bilaterally, diminished on Rt no wheezing, rhonchi or rales  Abd: soft, nontender, no hepatomegaly  Ext: no edema Musculoskeletal:  No deformities, BUE and BLE strength normal and equal Skin: warm and dry  Neuro:  alert and oriented X 3 MAE follows commands, no focal abnormalities noted Psych:  Normal affect   Relevant CV Studies: TTE 06/16/22  IMPRESSIONS     1. Left ventricular ejection fraction, by estimation, is 65 to 70%. Michelle  left ventricle has normal function. Michelle left ventricle has no regional  wall motion abnormalities. Indeterminate diastolic filling due to E-A  fusion. Michelle average left ventricular  global longitudinal strain is -24.0 %. Michelle global longitudinal strain is  normal.   2. Right ventricular systolic function is normal. Michelle right ventricular  size is normal. There is mildly elevated pulmonary artery systolic  pressure.   3. Large pleural effusion in Michelle right lateral region.   4. Michelle mitral valve is normal in structure. No evidence of mitral valve  regurgitation. No evidence of mitral stenosis.   5. Michelle aortic valve is tricuspid. Aortic valve regurgitation is not  visualized. No aortic stenosis is present.   6. Michelle inferior vena cava is normal in size with <50% respiratory  variability, suggesting right atrial pressure of 8 mmHg.   Comparison(s): No prior Echocardiogram.   FINDINGS   Left Ventricle: Left ventricular ejection fraction, by estimation, is 65  to 70%. Michelle left ventricle has normal function. Michelle left ventricle has no  regional wall motion abnormalities. Michelle average left ventricular global  longitudinal strain is -24.0 %.  Michelle global longitudinal strain is normal. Michelle left ventricular internal  cavity size was normal in size. There is no left ventricular hypertrophy.  Indeterminate diastolic filling due to E-A fusion.   Right Ventricle: Michelle right ventricular size is normal. No increase in  right ventricular wall thickness. Right  ventricular systolic function is  normal. There is mildly elevated pulmonary artery systolic pressure. Michelle  tricuspid regurgitant velocity is 2.69   m/s, and with an assumed right atrial pressure of 8 mmHg, Michelle estimated  right ventricular systolic pressure is 76.5 mmHg.   Left Atrium: Left atrial size was normal in size.   Right Atrium: Right atrial size was normal in size.   Pericardium: Trivial pericardial effusion is present.   Mitral Valve: Michelle mitral valve is normal in structure. No evidence of  mitral valve regurgitation. No evidence of mitral valve stenosis. MV peak  gradient, 9.7 mmHg. Michelle mean mitral valve gradient is 4.0 mmHg.   Tricuspid Valve: Michelle tricuspid valve is normal in structure. Tricuspid  valve regurgitation is not demonstrated. No evidence of tricuspid  stenosis.   Aortic Valve: Michelle aortic valve is tricuspid. Aortic valve regurgitation is  not visualized. No aortic stenosis is present.   Pulmonic Valve: Michelle pulmonic valve was normal in structure. Pulmonic valve  regurgitation is not visualized. No evidence of pulmonic stenosis.   Aorta: Michelle aortic root and ascending aorta are structurally normal, with  no evidence of dilitation.   Venous: Michelle inferior vena cava is normal in size with less than 50%  respiratory variability, suggesting right atrial pressure of 8 mmHg.   IAS/Shunts: No atrial level shunt detected by color flow Doppler.    Laboratory Data:  High Sensitivity Troponin:   Recent Labs  Lab 10/01/22 0520 10/01/22 1430  TROPONINIHS 4 5     Chemistry Recent Labs  Lab 10/20/22 0500 10/21/22 0529 10/22/22 0554  NA 138 139 141  K 4.9 4.9 4.6  CL 108 109 113*  CO2 19* 19* 19*  GLUCOSE 128* 82 88  BUN 65* 75* 74*  CREATININE 6.79* 6.83* 6.99*  CALCIUM 8.9 9.4 9.9  MG 2.6* 2.4 2.4  GFRNONAA 6* 6* 6*  ANIONGAP _0 Recent Labs  Lab 10/20/22 0500 10/21/22 0529 10/22/22 0554  PROT 5.7* 6.1* 5.8*  ALBUMIN 2.7* 3.0* 2.8*   AST _1 ALT _2 ALKPHOS 63 66 57  BILITOT 0.4 0.5 0.4   Lipids No results for input(s): "CHOL", "TRIG", "HDL", "LABVLDL", "LDLCALC", "CHOLHDL" in Michelle last 168 hours.  Hematology Recent Labs  Lab 10/20/22 0500 10/21/22 0529 10/22/22 0554  WBC 11.5* 6.7 8.3  RBC 3.02* 3.21* 3.21*  HGB 8.4* 8.9* 8.9*  HCT 26.3* 27.7* 27.7*  MCV 87.1 86.3 86.3  MCH 27.8 27.7 27.7  MCHC 31.9 32.1 32.1  RDW 16.1* 16.1* 16.2*  PLT 192 174 179   Thyroid No results for input(s): "TSH", "FREET4" in Michelle last 168 hours.  BNPNo results for input(s): "BNP", "PROBNP" in Michelle last 168 hours.  DDimer No results for input(s): "DDIMER" in Michelle last 168 hours.   Radiology/Studies:  CT Abd Limited W/O Cm  Result Date: 10/19/2022 CLINICAL DATA:  Acute kidney failure. Suboptimal renal ultrasound due to immobility and bowel-gas EXAM: CT ABDOMEN WITHOUT CONTRAST LIMITED TECHNIQUE: Multidetector CT imaging of Michelle abdomen was performed following Michelle standard protocol without IV contrast. RADIATION DOSE REDUCTION: This exam was performed according to Michelle departmental dose-optimization program which includes automated exposure control, adjustment of the mA and/or kV according to Michelle [name] size and/or use of iterative reconstruction technique. COMPARISON:  Ultrasound earlier today and CT abdomen and pelvis 07/18/2022 FINDINGS: Lower chest: Moderate right and small left pleural effusions. Fluid tracks into Michelle right major fissure. Associated atelectasis/infiltrates. Partially visualized CVC. Coronary artery calcification. Hepatobiliary: Respiratory motion obscures portions of Michelle upper abdomen. Unremarkable noncontrast appearance of Michelle liver. No biliary dilation. No cholelithiasis. Pancreas: Unremarkable. No pancreatic ductal dilatation or surrounding inflammatory changes. Spleen: Normal in size without focal abnormality. Adrenals/Urinary Tract: Adrenal glands are unremarkable. Kidneys are normal, without renal calculi,  focal lesion, or hydronephrosis. Stomach/Bowel: Normal caliber large and small bowel were visualized. Moderate stool burden in Michelle visualized portions of Michelle ascending and transverse colon. Vascular/Lymphatic: Aortic atherosclerosis. No enlarged abdominal or pelvic lymph nodes. Other: No free intraperitoneal air. Musculoskeletal: Expansile lytic lesion and soft tissue mass in Michelle anterior right sixth rib. Michelle mass measures 6.2 by 8.3 cm and causes mass effect on Michelle right hepatic lobe. Additional partially visualized expansile lytic mass in Michelle left iliac wing. Multiple bilateral subacute/chronic rib fractures and depressed sternal fracture. Predominantly lytic lesions throughout Michelle thoracolumbar spine with multiple vertebral body compression deformities similar to 07/18/2022. IMPRESSION: Unremarkable noncontrast appearance of Michelle kidneys. No hydronephrosis or visualized obstructing urinary calculi. Progression of diffuse osseous lytic lesions in Michelle axial and appendicular skeleton compatible with multiple myeloma. This includes increased size of Michelle expansile lytic lesion with soft tissue mass in Michelle anterior right sixth rib and left iliac wing.  Multiple pathologic vertebral body compression fractures are similar to 07/18/2022. Moderate colonic stool burden. Moderate right and small left pleural effusions. Electronically Signed   By: Placido Sou M.D.   On: 10/19/2022 20:01   US RENAL  Result Date: 10/19/2022 CLINICAL DATA:  AKI. Per technologist note, suboptimal images due to Michelle [name] breathing an overlying bowel gas. EXAM: RENAL / URINARY TRACT ULTRASOUND COMPLETE COMPARISON:  CT abdomen and pelvis July 18, 2022 FINDINGS: Right Kidney: Renal measurements: 8.6 x 3.9 x 4.8 cm = volume: 84.6 mL. Echogenicity within normal limits. No mass or hydronephrosis visualized. Left Kidney: Renal measurements: 9.9 x 6.1 x 5.0 cm = volume: 158.5 mL. Echogenicity within normal limits. No mass or hydronephrosis  visualized. Bladder: Decompressed with Foley catheter in place. Other: None. IMPRESSION: No acute findings. Electronically Signed   By: Beryle Flock M.D.   On: 10/19/2022 17:26     Assessment and Plan:   Pre-op eval for surgical repair of lt humerus and intramedullary nail placement into both femurs..  Low risk surgery though in itself.  Pt with multiple commodities mostly associated with aggressive multiple myeloma.  No hx of CAD or MI or angina.  On CT abd there is mention of  coronary calcifications and on prior CT chest  3 vessel coronary artery calcification.  Echo in August was normal from cardiac perspective.  Dr. Johney Frame to see.     Fx Lt humerus pathological fx.   And with hip films today she has pathologic fracture of Michelle left hip with lytic lesion in Michelle right femoral neck concerning for impending pathologic fracture. This will need to be addressed prior to Michelle humerus  AKI with some improvement of Cr since admit followed by nephrology.  +246 Rt pl effusion- malignant.  With aggressive multiple myeloma followed by oncology. With chemo and radiation.  Palliative care consulted.    Risk Assessment/Risk Scores:                For questions or updates, please contact [place] Please consult [email or web address] for contact info under    Signed, Cecilie Kicks, NP  10/22/2022 11:13 AM   Michelle [name] seen and examined and agree with Cecilie Kicks, NP as detailed above.  In brief, Michelle Michelle [name] is a 61 year old female with history of HTN, HLD, IgG kappa myeloma with diffuse osseous lytic lesions who presented with left humeral fracture and was also found to have a pathologic fracture of Michelle left hip as well as a lytic lesion in Michelle right femoral neck concerning for impending pathologic fracture. Cardiology is consulted for pre-operative evaluation prior to intramedullary nail placement.  Michelle [name] with no known significant CV history. She was incidentally noted to have 3 vessel  coronary calcification on CT chest in Michelle past. No anginal symptoms but she is not very mobile at baseline. TTE 06/2022 with LVEF 65-70% with normal strain, normal RV, and no evidence of valve disease. On admission here, she was noted to have multiple pathologic fractures as above. Labs notable for Cr 7.0, hemoglobin 8.9.   Currently, Michelle Michelle [name] states that she and her family discussed everything and she does not wish to pursue surgery at this time. If she were to choose to pursue in Michelle future, there is no indication for stress testing prior to OR as Michelle [name] is not a cath candidate due to renal function and it would not change management. She has preserved EF with no anginal symptoms. Her revised cardiac risk index is 6%  for major CV event in Michelle next 30 days.   GEN: Sitting in bed, comfortable  Neck: No JVD Cardiac: Tachycardic, regular, no mumurs Respiratory: Clear to auscultation bilaterally. GI: Soft, nontender, non-distended  MS: LUE in a sling. Neuro:  Nonfocal  Psych: Normal affect    Plan: -Michelle [name] states she does not wish to proceed with surgery after discussion with her family -If she does choose to proceed with surgery in Michelle future, no ischemic testing needed prior to OR as she is not a cath candidate due to renal function and Michelle [name] denies any anginal symptoms with normal EF on TTE 06/2022 -Will resume home crestor 74m daily -Palliative care has been consulted -Management of MM per Dr. EMarin [name]-Management of multiple fractures per primary and ortho  HGwyndolyn Kaufman MD

## 2022-10-22 NOTE — Progress Notes (Signed)
Hip radiographs taken earlier today demonstrate pathologic fracture of the left hip with lytic lesion in the right femoral neck concerning for impending pathologic fracture.  Plan to have patient nonweightbearing to both lower extremities.  We will need to address these problems with intramedullary nail placement into both femurs to allow her to weight-bear.  We will likely address this first before proceeding with operative fixation of the left humerus fracture.

## 2022-10-22 NOTE — Progress Notes (Signed)
Michelle [name] is still bothered quite a bit by the left arm pain.  It would really be nice if this could be fixed while she was in the hospital.  This will really help her quality of life.  Hopefully, orthopedic surgery will be able to come by to see her.  She still has renal insufficiency.  I know that Nephrology is helping out with that.  Her labs today show a BUN of 74 creatinine 6.99.  Her phosphorus is 5.4.  Albumin is 2.8.  Her white count is 8.3.  Hemoglobin 8.9.  Platelet count 179,000.  She is still making urine.  She is not having any nausea or vomiting.  Her main problem is pain.  Again, everything is coming from this left arm.  Again and it would be really nice if Orthopedic Surgery would be able to repair this while she is in the hospital.  She has had no fever.  There has been no bleeding.  She is going to the bathroom.  She is not having diarrhea.  She has had no cough or shortness of breath.  Is been no leg swelling.  She has had no rashes.  Her vital signs show temperature 98.1.  Pulse 109.  Blood pressure 142/89.  Her lungs sound clear bilaterally.  Cardiac exam is tachycardic but regular.  There are no murmurs.  Abdomen is soft.  Bowel sounds are present.  She has no fluid wave.  Extremities shows the left upper arm in a sling.  She has tenderness to palpation of the left upper arm.  She has really no range of motion.  Neurological exam shows no focal deficits.  Again, Michelle [name] does have the light chain myeloma.  She has renal insufficiency.  She is urinating.  There is no hyperkalemia.  Again her real problem is this left arm.  Again, it would really be nice if Orthopedic Surgery might be able to repair of this while she is in the hospital.  Her blood counts are fine for any kind of surgery.  I know that she is getting fantastic care from everybody up on [address].  I do appreciate the compassion in the attention that is given to all of our patients.    [name], MD  Galatians  4:4

## 2022-10-22 NOTE — Progress Notes (Signed)
[facility]                                                                               Michelle [name], is a 61 y.o. female, DOB - 1961/07/29, YQI:[id number] Admit date - 10/17/2022    Outpatient Primary MD for the patient is Leeroy Cha, MD  LOS - 5  days    Brief summary    Michelle [name] is a 61 y.o. female with medical history significant for CKD 3B, multiple myeloma and malignant pleural effusion status post talc pleurodesis, status post radiation therapy to the skull base, treated at cancer center, who initially presented to radiation oncology at the cancer center with complaints of generalized weakness, left arm pain, and vomiting. . X-rays were done. The patient was informed she had a humerus fracture and was advised to go to the ED for further management.  In the ED, workup revealed left humerus fracture, AKI and sinus tachycardia.  EDP discussed the case with orthopedic surgery, Dr. Marlou Sa.  Orthopedic surgery recommended outpatient follow-up on Monday 10/18/2022. The patient prefers to be admitted at [facility] long rather than [facility].  Admitted by [facility], hospitalist service.  Oncology consulted and on board. Nephrology on board for AKI. Dr Marlou Sa with orthopedics reconsulted to see if she needs surgical repair of the left humerus fracture.    Assessment & Plan    Assessment and Plan:   AKI;  Superimposed on Stage 3 b CKD in the setting of MM and poor  oral intake.  Probably secondary to a combination of  Multiple Myeloma and dehydration.  CT abdomen does not show any obstruction and hydronephrosis.   Creatinine on admission at 7.7, baseline creatinine at 2.2.  With chemo over the weekend and gentle hydration renal parameters show only slight improvement to 6.96 to 6.76. to 6.83 to 6.99. Her phos is 5.3  Continue with strict intake and output.  Nephrology on board and appreciate recommendations.  She is approaching ESRD.   Multiple Myeloma:  She has light  chain myeloma.  Diagnosed in June 2023 with extensive spinal disease.  Dr Marin Olp on board and she completed cytoxan and carfilzomib this weekend.     Malignant right pleural effusion:  CXR shows  Right pleural effusion has progressed. Pleural based density right upper lobe appears slightly larger. She is on RA and does not appear to be symptomatic from the pleural effusion.  Appears comfortable.    Let humerus fracture:  - continue with splint.  Pain control with MS contin, along with oxycodone and dilaudid IV.  - physical therapy evaluation ordered.  - orthopedics Dr Marlou Sa from [facility] consulted, plan for surgery on Thursday pending cardiology pre op clearance.    Sinus tachycardia:  continue with lopressor 12.5 mg BID. Marland Kitchen    Hypothyroidism; Resume synthroid 88 mcg daily.      Anemia of chronic disease: Hemoglobin around 8. Transfuse to keep hemoglobin greater than 7.   In view of her aggressive MM, pathological fracture, AKI, palliative care consulted for goals of care. Appreciate recommendations.    Estimated body mass index is 29.59 kg/m as calculated from the following:   Height as  of this encounter: 5' 4.25" (1.632 m).   Weight as of this encounter: 78.8 kg.  Code Status: full code.  DVT Prophylaxis:  heparin injection 5,000 Units Start: 10/17/22 2200   Level of Care: Level of care: Med-Surg Family Communication: daughter at bedside.   Disposition Plan:     Remains inpatient appropriate:  chemo and AKI.   Procedures:  US renal.   Consultants:   Nephrology Oncology. Dr Marin Olp.  Cardiology for pre op clearance.  Orthopedics Dr Marlou Sa  Antimicrobials:   Anti-infectives (From admission, onward)    None        Medications  Scheduled Meds:  Chlorhexidine Gluconate Cloth  6 each Topical QHS   heparin  5,000 Units Subcutaneous Q8H   lactulose  20 g Oral BID   levothyroxine  88 mcg Oral QAC breakfast   megestrol  400 mg Oral BID   metoprolol  tartrate  12.5 mg Oral BID   morphine  15 mg Oral Q12H   senna-docusate  2 tablet Oral BID   Continuous Infusions:  ondansetron 8 mg (10/22/22 0632)   PRN Meds:.acetaminophen, influenza vac split quadrivalent PF, lip balm, melatonin, oxyCODONE, pneumococcal 23 valent vaccine, prochlorperazine    Subjective:   Michelle [name] was seen and examined today.  No new complaints. Alert and oriented.   Objective:   Vitals:   10/21/22 0524 10/21/22 1541 10/21/22 2012 10/22/22 0559  BP: 125/78 139/88 124/76 (!) 142/89  Pulse: (!) 108 (!) 109 (!) 106 (!) 109  Resp: _0 Temp: 97.7 F (36.5 C) 97.9 F (36.6 C) 98.1 F (36.7 C) 98.1 F (36.7 C)  TempSrc: Oral Oral Oral Oral  SpO2: 99% 98% 99% 100%  Weight: 82 kg   78.8 kg  Height:        Intake/Output Summary (Last 24 hours) at 10/22/2022 0908 Last data filed at 10/22/2022 0336 Gross per 24 hour  Intake 170 ml  Output 200 ml  Net -30 ml    Filed Weights   10/20/22 2110 10/21/22 0524 10/22/22 0559  Weight: 81.6 kg 82 kg 78.8 kg     Exam General exam: Appears calm and comfortable  Respiratory system: Clear to auscultation. Respiratory effort normal. Cardiovascular system: S1 & S2 heard, RRR. No JVD, murmurs,  Gastrointestinal system: Abdomen is nondistended, soft and nontender. Normal bowel sounds heard. Central nervous system: Alert and oriented. No focal neurological deficits. Extremities: left arm bandaged.  Skin: No rashes, Psychiatry:  Mood & affect appropriate.          Data Reviewed:  I have personally reviewed following labs and imaging studies   CBC Lab Results  Component Value Date   WBC 8.3 10/22/2022   RBC 3.21 (L) 10/22/2022   HGB 8.9 (L) 10/22/2022   HCT 27.7 (L) 10/22/2022   MCV 86.3 10/22/2022   MCH 27.7 10/22/2022   PLT 179 10/22/2022   MCHC 32.1 10/22/2022   RDW 16.2 (H) 10/22/2022   LYMPHSABS 1.2 10/11/2022   MONOABS 3.5 (H) 10/11/2022   EOSABS 0.1 10/11/2022   BASOSABS 0.0  09/81/1914     Last metabolic panel Lab Results  Component Value Date   NA 141 10/22/2022   K 4.6 10/22/2022   CL 113 (H) 10/22/2022   CO2 19 (L) 10/22/2022   BUN 74 (H) 10/22/2022   CREATININE 6.99 (H) 10/22/2022   GLUCOSE 88 10/22/2022   GFRNONAA 6 (L) 10/22/2022   GFRAA 51 (L) 07/12/2015   CALCIUM 9.9 10/22/2022  PHOS 5.4 (H) 10/22/2022   PROT 5.8 (L) 10/22/2022   ALBUMIN 2.8 (L) 10/22/2022   LABGLOB 2.9 10/11/2022   AGRATIO 1.1 10/11/2022   BILITOT 0.4 10/22/2022   ALKPHOS 57 10/22/2022   AST 22 10/22/2022   ALT 14 10/22/2022   ANIONGAP 9 10/22/2022    CBG (last 3)  No results for input(s): "GLUCAP" in the last 72 hours.    Coagulation Profile: No results for input(s): "INR", "PROTIME" in the last 168 hours.   Radiology Studies: No results found.     Hosie Poisson M.D. [facility] 10/22/2022, 9:08 AM  Available via Epic secure chat 7am-7pm After 7 pm, please refer to night coverage provider listed on amion.

## 2022-10-22 NOTE — Progress Notes (Signed)
Patient ID: Michelle [name], female   DOB: 07-11-1961, 61 y.o.   MRN: [id number] S: Feels tired today. O:BP (!) 142/89 (BP Location: Right Arm)   Pulse (!) 109   Temp 98.1 F (36.7 C) (Oral)   Resp 18   Ht 5' 4.25" (1.632 m)   Wt 78.8 kg   SpO2 100%   BMI 29.59 kg/m   Intake/Output Summary (Last 24 hours) at 10/22/2022 1125 Last data filed at 10/22/2022 0336 Gross per 24 hour  Intake 170 ml  Output 200 ml  Net -30 ml   Intake/Output: I/O last 3 completed shifts: In: 460 [P.O.:360; IV Piggyback:100] Out: 1000 [Urine:1000]  Intake/Output this shift:  No intake/output data recorded. Weight change: -2.847 kg Gen: NAD CVS: tachy at 109 Resp: CTA Abd: +BS, soft, NT/ND Ext: no edema  Recent Labs  Lab 10/17/22 1712 10/17/22 2202 10/18/22 0849 10/19/22 0556 10/20/22 0500 10/21/22 0529 10/22/22 0554  NA 137  --  140 139 138 139 141  K 5.1  --  4.7 5.0 4.9 4.9 4.6  CL 100  --  108 108 108 109 113*  CO2 22  --  21* 20* 19* 19* 19*  GLUCOSE 103*  --  95 147* 128* 82 88  BUN 51*  --  52* 59* 65* 75* 74*  CREATININE 7.72* 7.16* 7.09* 6.96* 6.79* 6.83* 6.99*  ALBUMIN  --   --   --  2.8* 2.7* 3.0* 2.8*  CALCIUM 10.2  --  9.4 9.1 8.9 9.4 9.9  PHOS  --   --   --  5.5* 5.5* 5.3* 5.4*  AST  --   --   --  _0 ALT  --   --   --  _1 Liver Function Tests: Recent Labs  Lab 10/20/22 0500 10/21/22 0529 10/22/22 0554  AST _2 ALT _3 ALKPHOS 63 66 57  BILITOT 0.4 0.5 0.4  PROT 5.7* 6.1* 5.8*  ALBUMIN 2.7* 3.0* 2.8*   No results for input(s): "LIPASE", "AMYLASE" in the last 168 hours. No results for input(s): "AMMONIA" in the last 168 hours. CBC: Recent Labs  Lab 10/17/22 2202 10/19/22 0515 10/20/22 0500 10/21/22 0529 10/22/22 0554  WBC 7.6 6.5 11.5* 6.7 8.3  HGB 9.3* 8.4* 8.4* 8.9* 8.9*  HCT 28.9* 26.5* 26.3* 27.7* 27.7*  MCV 87.0 86.9 87.1 86.3 86.3  PLT 200 193 192 174 179   Cardiac Enzymes: No results for input(s): "CKTOTAL",  "CKMB", "CKMBINDEX", "TROPONINI" in the last 168 hours. CBG: No results for input(s): "GLUCAP" in the last 168 hours.  Iron Studies: No results for input(s): "IRON", "TIBC", "TRANSFERRIN", "FERRITIN" in the last 72 hours. Studies/Results: DG HIP UNILAT WITH PELVIS 2-3 VIEWS LEFT  Result Date: 10/22/2022 CLINICAL DATA:  Left groin pain.  History of multiple myeloma. EXAM: DG HIP (WITH OR WITHOUT PELVIS) 2-3V LEFT COMPARISON:  CT abdomen 10/19/2022, CT abdomen pelvis 07/18/2022 FINDINGS: Compared to 07/18/2022 CT, there is a new lytic lesion with destruction of the left lesser trochanter. The tip of the left lesser trochanter is superiorly displaced approximately 1.8 cm. This is suspicious for a bone metastasis and pathologic avulsion. Multiple additional bilateral proximal femoral lucencies are again seen consistent with lytic metastases. The largest is seen within the right femoral neck measuring up to approximately 3.8 cm in diameter, similar to 07/18/2022 CT. Multiple lytic lesions are again seen throughout the bilateral superior and inferior pubic rami  and bilateral iliac bones, although better visualized on prior CT. On prior 07/18/2022 CT, the largest was with in the left ilium, and there is diffuse lucency within the superior left ilium corresponding to this lesion. IMPRESSION: 1. Compared to 07/18/2022 CT, there is a new left lesser trochanter lytic lesion consistent with a bone metastasis and pathologic displaced avulsion fracture. 2. Multiple additional lytic lesions are again seen throughout the bilateral superior and inferior pubic rami and bilateral iliac bones, as on prior CT and consistent with reported history of multiple myeloma. Electronically Signed   By: Ronald  Viola M.D.   On: 10/22/2022 11:16    Chlorhexidine Gluconate Cloth  6 each Topical QHS   heparin  5,000 Units Subcutaneous Q8H   lactulose  20 g Oral BID   levothyroxine  88 mcg Oral QAC breakfast   megestrol  400 mg Oral BID    metoprolol tartrate  12.5 mg Oral BID   morphine  15 mg Oral Q12H   senna-docusate  2 tablet Oral BID    BMET    Component Value Date/Time   NA 141 10/22/2022 0554   K 4.6 10/22/2022 0554   CL 113 (H) 10/22/2022 0554   CO2 19 (L) 10/22/2022 0554   GLUCOSE 88 10/22/2022 0554   BUN 74 (H) 10/22/2022 0554   CREATININE 6.99 (H) 10/22/2022 0554   CREATININE 2.22 (H) 10/11/2022 0848   CALCIUM 9.9 10/22/2022 0554   GFRNONAA 6 (L) 10/22/2022 0554   GFRNONAA 24 (L) 10/11/2022 0848   GFRAA 51 (L) 07/12/2015 1942   CBC    Component Value Date/Time   WBC 8.3 10/22/2022 0554   RBC 3.21 (L) 10/22/2022 0554   HGB 8.9 (L) 10/22/2022 0554   HGB 10.7 (L) 10/11/2022 0848   HCT 27.7 (L) 10/22/2022 0554   PLT 179 10/22/2022 0554   PLT 246 10/11/2022 0848   MCV 86.3 10/22/2022 0554   MCH 27.7 10/22/2022 0554   MCHC 32.1 10/22/2022 0554   RDW 16.2 (H) 10/22/2022 0554   LYMPHSABS 1.2 10/11/2022 0848   MONOABS 3.5 (H) 10/11/2022 0848   EOSABS 0.1 10/11/2022 0848   BASOSABS 0.0 10/11/2022 0848    Assessment/ Plan: AKI on CKD 3b - b/l creat 1.53- 2.26 from sept - nov 2023, eGFR 29- 38 ml/min. Creat here 7.7 on admission in setting of multiple myeloma w/ high LC's. UA  negative. CT abd noncontrast showed no GU obstruction. Suspect AKI / CKD due to myeloma kidney. IVF"s dc'd, agree, she is eating and euvolemic on exam. Treatment is chemoRx. Per oncology is starting new chemoRx with cytoxan and carfilzomib, given 12/16. Scr improved since admission.  UOP has dropped off since stopping IVF's and poor po intake, would recommend restarting and follow.  No uremic issues at this time. Will follow.   Avoid nephrotoxic medications including NSAIDs and iodinated intravenous contrast exposure unless the latter is absolutely indicated.   Preferred narcotic agents for pain control are hydromorphone, fentanyl, and methadone. Morphine should not be used.  Avoid Baclofen and avoid oral sodium phosphate and  magnesium citrate based laxatives / bowel preps.  Continue strict Input and Output monitoring. Will monitor the patient closely with you and intervene or adjust therapy as indicated by changes in clinical status/labs  L humerus fracture - pathologic due to MM.  Seen by Ortho today and plan for surgical intervention this week. Multiple myeloma - diagnosed June 2023 w/ extensive spinal disease. Recent admit showed malignant R pleural effusion. F/b Dr   Ennever GOC - pt has sig complications from her aggressive cancer/ myeloma and now impending end-stage kidney failure is a real possibility. Have d/w pt briefly. Palliative care team to be consulted for GOC.    A. , MD [facility] [phone]  

## 2022-10-23 DIAGNOSIS — N179 Acute kidney failure, unspecified: Secondary | ICD-10-CM | POA: Diagnosis not present

## 2022-10-23 LAB — COMPREHENSIVE METABOLIC PANEL
ALT: 10 U/L (ref 0–44)
AST: 17 U/L (ref 15–41)
Albumin: 2.4 g/dL — ABNORMAL LOW (ref 3.5–5.0)
Alkaline Phosphatase: 48 U/L (ref 38–126)
Anion gap: 8 (ref 5–15)
BUN: 63 mg/dL — ABNORMAL HIGH (ref 8–23)
CO2: 20 mmol/L — ABNORMAL LOW (ref 22–32)
Calcium: 9.5 mg/dL (ref 8.9–10.3)
Chloride: 116 mmol/L — ABNORMAL HIGH (ref 98–111)
Creatinine, Ser: 6.47 mg/dL — ABNORMAL HIGH (ref 0.44–1.00)
GFR, Estimated: 7 mL/min — ABNORMAL LOW (ref >60)
Glucose, Bld: 84 mg/dL (ref 70–99)
Potassium: 4.7 mmol/L (ref 3.5–5.1)
Sodium: 144 mmol/L (ref 135–145)
Total Bilirubin: 0.6 mg/dL (ref 0.3–1.2)
Total Protein: 5.2 g/dL — ABNORMAL LOW (ref 6.5–8.1)

## 2022-10-23 LAB — VITAMIN D 25 HYDROXY (VIT D DEFICIENCY, FRACTURES): Vit D, 25-Hydroxy: 37.57 ng/mL (ref 30–100)

## 2022-10-23 LAB — CBC
HCT: 24.2 % — ABNORMAL LOW (ref 36.0–46.0)
Hemoglobin: 7.7 g/dL — ABNORMAL LOW (ref 12.0–15.0)
MCH: 27.6 pg (ref 26.0–34.0)
MCHC: 31.8 g/dL (ref 30.0–36.0)
MCV: 86.7 fL (ref 80.0–100.0)
Platelets: 160 10*3/uL (ref 150–400)
RBC: 2.79 MIL/uL — ABNORMAL LOW (ref 3.87–5.11)
RDW: 16.1 % — ABNORMAL HIGH (ref 11.5–15.5)
WBC: 6.7 10*3/uL (ref 4.0–10.5)
nRBC: 0.6 % — ABNORMAL HIGH (ref 0.0–0.2)

## 2022-10-23 MED ORDER — METOPROLOL TARTRATE 5 MG/5ML IV SOLN: 5.0000 mg | INTRAVENOUS | Status: DC | PRN

## 2022-10-23 MED ORDER — EPOETIN ALFA 20000 UNIT/ML IJ SOLN
40000.0000 [IU] | Freq: Once | INTRAMUSCULAR | Status: AC
  Administered 2022-10-23: 40000 [IU] via SUBCUTANEOUS
  Filled 2022-10-23: qty 1

## 2022-10-23 MED ORDER — ONDANSETRON HCL 4 MG/2ML IJ SOLN: 4.0000 mg | Freq: Four times a day (QID) | INTRAMUSCULAR | Status: DC | PRN

## 2022-10-23 MED ORDER — IPRATROPIUM-ALBUTEROL 0.5-2.5 (3) MG/3ML IN SOLN: 3.0000 mL | RESPIRATORY_TRACT | Status: DC | PRN

## 2022-10-23 MED ORDER — TRAZODONE HCL 50 MG PO TABS
50.0000 mg | ORAL_TABLET | Freq: Every evening | ORAL | Status: DC | PRN
  Administered 2022-10-30: 50 mg via ORAL
  Filled 2022-10-23: qty 1

## 2022-10-23 MED ORDER — GUAIFENESIN 100 MG/5ML PO LIQD: 5.0000 mL | ORAL | Status: DC | PRN

## 2022-10-23 MED ORDER — HYDRALAZINE HCL 20 MG/ML IJ SOLN: 10.0000 mg | INTRAMUSCULAR | Status: DC | PRN

## 2022-10-23 MED ORDER — SENNOSIDES-DOCUSATE SODIUM 8.6-50 MG PO TABS: 1.0000 | ORAL_TABLET | Freq: Every evening | ORAL | Status: DC | PRN

## 2022-10-23 NOTE — Progress Notes (Signed)
I very much appreciate the incredible recommendation and input from Dr. Marlou Sa.  Unfortunately, I talked to Ms. Pouliot this morning at length.  She just is not ready for surgery.  She says that this is just not the right time for her to have surgery.  She has been let by her faith.  I told her that she is at significant risk for fracturing her hip bone.  As such, I told her that until she has surgery, she would not able to put any weight onto her hips.  She understands this.  She says that she also is not ready for any surgery for the left arm despite all the pain that she is having.  Again, she is being led by her faith.  She we will pray about this.  Again she understands the consequences of not having surgery to repair the fracture.  She does seem to be a little bit more comfortable.  Hopefully, the MS Contin is helping a little bit.  She does not have any nausea.  I think this also is helping.  She has not had any fever.  She has had no cough.  There has been no bleeding.  She has had no diarrhea.  With her labs, her hemoglobin is dropped.  We may have to think about transfusing her later on.  Her white cell count 6.7.  Hemoglobin is 160,000.  Her renal function seems to be a little bit better.  Her BUN is 63 creatinine 6.47.  Albumin is 2.4.  Calcium is 9.5.  Her vital signs show temperature of 98.  Pulse 104.  Blood pressure 121/72.  Her lungs sound clear bilaterally.  Cardiac exam regular rate and rhythm.  There are no murmurs.  Abdomen is soft.  Bowel sounds are present.  There is no distention.  There is no guarding or rebound tenderness.  Extremities shows no clubbing, cyanosis or edema.  She has a left arm in a sling.  Neurological exam is nonfocal.  Clearly, Ms. Hartwell myeloma really wants to attack her bones.  She already has the pathologic fracture in the left humerus.  There is seems to be another 1 in the left femur.  She really needs to have surgery for these.  Again she is not ready for  this.  Again we prayed about this.  She understands the consequences of her not having surgery right now.  If she does develop a significant fracture, even with surgery, she could have a permanent disability.  She accepts this.  She just knows that God will let her know when the right time comes for surgery.  Again appreciate Dr. Randel Pigg assistance.  I know that he would have been able to do surgery.  Thankfully, her renal function is better.  This also is a plus.  She seems to be eating a little bit better.  She is not having nausea which is helping.  We will have to see what her hemoglobin is tomorrow.  Again we may have to think about transfusing her.  We can always give her Procrit.  I know this is incredibly complicated.  I know the staff up on [address] are doing a wonderful job with her.  I appreciate all of their compassion.  [name], MD  Oswaldo Milian 7:14

## 2022-10-23 NOTE — Progress Notes (Signed)
Physical Therapy Treatment Patient Details Name: Michelle [name] MRN: [id number] DOB: 03/30/1961 Today's Date: 10/23/2022   History of Present Illness Patient is a 61 year old female who initially presented to radiation oncology at the cancer center with complaints of generalized weakness, left arm pain, and vomiting.   In the ED, workup revealed left humerus fracture, AKI and sinus tachycardia.  EDP discussed the case with orthopedic surgery, Dr. Marlou Sa.  Orthopedic surgery recommended outpatient follow-up on Monday 10/18/2022.  No plan for surgical intervention at this time.PMH: CKD 3B, multiple myeloma and malignant pleural effusion status post talc pleurodesis, status post radiation therapy to the skull base. Patient found to have   Patient has bilateral proximal femur metastatic lesions.  The left one is more unstable than the right.  Was made NWB on 12/19 to bilateral LEs.    PT Comments    Pt very pleasant and motivated, having word finding challenges at times. Reported having more difficulty with moving the LLE, stating "my leg leg will not do what I tell it to do" . Pt has active DF/PF, but has limited AROM for hip flexion and quad control. Can perform B LE quad set, but not perform active LLE ROM at this time. Reports no pain in the L hip area or R at this time as well.  Worked on getting to the patients Right side of the bed. Daughter was present during session, but very little interaction at this time. Explained to patient and dtr that she has new findings and limitations of NWB BLE that this would create challenges with transfers being only using a sliding board or a hoyer lift. I am not sure if patient and dtr ar fully understanding all that is new and going on and if they are ready for these new challenges. Pt will have to have 24/7 , ramp in place, WC, cushion. Sliding board, potentially a hoyer lift and hospital bed if she goes home. Or may need ST-SNF for continued education on how to  perform these tasks.    Recommendations for follow up therapy are one component of a multi-disciplinary discharge planning process, led by the attending physician.  Recommendations may be updated based on patient status, additional functional criteria and insurance authorization.  Follow Up Recommendations  Skilled nursing-short term rehab (<3 hours/day) (or HHPT whichever the family is willing to support) Can patient physically be transported by private vehicle: No   Assistance Recommended at Discharge Frequent or constant Supervision/Assistance  Patient can return home with the following A lot of help with walking and/or transfers;A lot of help with bathing/dressing/bathroom;Help with stairs or ramp for entrance;Other (comment) (will need ramp)   Equipment Recommendations  Wheelchair (measurements PT);Wheelchair cushion (measurements PT);Hospital bed;Other (comment) (ramp)    Recommendations for Other Services          Precautions / Restrictions Precautions Precautions: Fall Precaution Comments: back pain, METS to bilateral femur and lesions in B pelvic bones, made NWB 12/19 Required Braces or Orthoses: Sling Splint/Cast: LUE sling Restrictions Weight Bearing Restrictions: Yes LUE Weight Bearing: Non weight bearing RLE Weight Bearing: Non weight bearing LLE Weight Bearing: Non weight bearing Other Position/Activity Restrictions: no formal orders however pt with humeral fx, maintained NWB L UE with sling in place for mobility. new 12/19 NWB B LES now due to fractures and lesions     Mobility  Bed Mobility Overal bed mobility: Needs Assistance Bed Mobility: Supine to Sit, Sit to Supine     Supine to  sit: Mod assist Sit to supine: Mod assist   General bed mobility comments: assist with LEs ( especially with LLE) and upper body due to NWB limitations and LLE weakness . Sat edge of bed independently. was able to scoot forwad and back using R UE and hips  to reposition     Transfers                        Ambulation/Gait                   Stairs             Wheelchair Mobility    Modified Rankin (Stroke Patients Only)       Balance                                            Cognition Arousal/Alertness: Awake/alert Behavior During Therapy: WFL for tasks assessed/performed Overall Cognitive Status: Within Functional Limits for tasks assessed                                 General Comments: Able to follow commands. Some difficulty with maintaining attention and understanding her medical complexities and current deficits        Exercises      General Comments        Pertinent Vitals/Pain Pain Assessment Pain Assessment: Faces Faces Pain Scale: Hurts little more Pain Location: left arm, denies pain in LLE Pain Descriptors / Indicators: Discomfort, Sore Pain Intervention(s): Limited activity within patient's tolerance, Monitored during session    Home Living                          Prior Function            PT Goals (current goals can now be found in the care plan section) Acute Rehab PT Goals Patient Stated Goal: home with daughters and HHPT PT Goal Formulation: With patient Time For Goal Achievement: 11/03/22 Potential to Achieve Goals: Fair Progress towards PT goals: Goals downgraded-see care plan    Frequency    Min 3X/week      PT Plan Discharge plan needs to be updated    Co-evaluation   Reason for Co-Treatment: To address functional/ADL transfers;For patient/therapist safety          AM-PAC PT "6 Clicks" Mobility   Outcome Measure  Help needed turning from your back to your side while in a flat bed without using bedrails?: A Lot Help needed moving from lying on your back to sitting on the side of a flat bed without using bedrails?: A Lot Help needed moving to and from a bed to a chair (including a wheelchair)?: A Lot Help needed  standing up from a chair using your arms (e.g., wheelchair or bedside chair)?: Total Help needed to walk in hospital room?: Total Help needed climbing 3-5 steps with a railing? : Total 6 Click Score: 9    End of Session   Activity Tolerance: Patient tolerated treatment well (limited session due to new findings of NWB BLEs and needing to assess patietns and families ability to be able to care for patient at this level .) Patient left: with call bell/phone within reach;with family/visitor present;in bed Nurse Communication: Mobility status  PT Visit Diagnosis: Other abnormalities of gait and mobility (R26.89);Muscle weakness (generalized) (M62.81);Difficulty in walking, not elsewhere classified (R26.2)     Time: 3475-8307 PT Time Calculation (min) (ACUTE ONLY): 23 min  Charges:  $Therapeutic Activity: 8-22 mins                     Michelle [name], PT, MPT Acute Rehabilitation Services Office: [phone] If a weekend: [facility] Rehab w/e pager [phone] 10/23/2022    Clide Dales 10/23/2022, 4:48 PM

## 2022-10-23 NOTE — Progress Notes (Signed)
Occupational Therapy Treatment Patient Details Name: Michelle [name] MRN: [id number] DOB: September 29, 1961 Today's Date: 10/23/2022   History of present illness Patient is a 61 year old female who initially presented to radiation oncology at the cancer center with complaints of generalized weakness, left arm pain, and vomiting.   In the ED, workup revealed left humerus fracture, AKI and sinus tachycardia.  EDP discussed the case with orthopedic surgery, Dr. Marlou Sa.  Orthopedic surgery recommended outpatient follow-up on Monday 10/18/2022.  No plan for surgical intervention at this time.PMH: CKD 3B, multiple myeloma and malignant pleural effusion status post talc pleurodesis, status post radiation therapy to the skull base. Patient found to have   Patient has bilateral proximal femur metastatic lesions.  The left one is more unstable than the right.  Was made NWB on 12/19 to bilateral LEs.   OT comments  Patient verbalizes understanding of Nwb status of upper extremity and sling and arm position good today. Practiced transferring to edge of bed towards the right - towards good arm - to sit edge of bed. Patient's progress and status complicated by new NWB status to both lower extremities. Unsure of current plan and how long patient will be NWB if she is not having surgery. Patient states she wants to go home but she doesn't seem to be aware of how her NWB status will effect her at home. Her daughter who is present - doesn't contradict her mother or suggest an alternative plan. Currently if patient goes home - and family agreeable to 24/7 assistance at bed level- she would likely need hospital bed, hoyer or sliding board for transfers and a wheelchair. Current recommendation is for [facility] - would benefit on teaching family how to transfer and perform ADLs at current level. Will continue to follow.    Recommendations for follow up therapy are one component of a multi-disciplinary discharge planning process, led by the  attending physician.  Recommendations may be updated based on patient status, additional functional criteria and insurance authorization.    Follow Up Recommendations  Home health OT     Assistance Recommended at Discharge Frequent or constant Supervision/Assistance  Patient can return home with the following  A lot of help with bathing/dressing/bathroom;Assistance with cooking/housework;Direct supervision/assist for financial management;Assist for transportation;Help with stairs or ramp for entrance;Direct supervision/assist for medications management;A lot of help with walking and/or transfers   Equipment Recommendations  Wheelchair (measurements OT);Hospital bed (If plan is home.)    Recommendations for Other Services      Precautions / Restrictions Precautions Precautions: Fall Precaution Comments: back pain, METS to bilateral femur and lesions in B pelvic bones, made NWB 12/19 Required Braces or Orthoses: Sling Splint/Cast: LUE sling Restrictions Weight Bearing Restrictions: Yes LUE Weight Bearing: Non weight bearing RLE Weight Bearing: Non weight bearing LLE Weight Bearing: Non weight bearing Other Position/Activity Restrictions: no formal orders however pt with humeral fx, maintained NWB L UE with sling in place for mobility. new 12/19 NWB B LES now due to fractures and lesions       Mobility Bed Mobility                    Transfers                         Balance Overall balance assessment: Needs assistance Sitting-balance support: No upper extremity supported, Feet supported Sitting balance-Leahy Scale: Good  ADL either performed or assessed with clinical judgement   ADL Overall ADL's : Needs assistance/impaired     Grooming: Set up;Wash/dry hands;Moderate assistance Grooming Details (indicate cue type and reason): Needed assistance to wash left hand - she still guarding it quite a bit                                General ADL Comments: Patient's sling in proper position today and arm in good position. She knows she is not allowed to put weight through it. To practice bed transfers we worked on transferring to the right. She exhibited difficulty lifting LLE for transfer and overall needed a light mod to transfer to sitting edge of bed for LLE and scooting forward.    Extremity/Trunk Assessment              Vision       Perception     Praxis      Cognition Arousal/Alertness: Awake/alert Behavior During Therapy: WFL for tasks assessed/performed Overall Cognitive Status: Within Functional Limits for tasks assessed                                 General Comments: Able to follow commands. Some difficulty with maintaining attention and understanding her medical complexities and current deficits        Exercises      Shoulder Instructions       General Comments      Pertinent Vitals/ Pain       Pain Assessment Pain Assessment: 0-10 Pain Score: 5  Pain Location: left arm Pain Descriptors / Indicators: Discomfort, Sore Pain Intervention(s): Limited activity within patient's tolerance  Home Living                                          Prior Functioning/Environment              Frequency  Min 2X/week        Progress Toward Goals  OT Goals(current goals can now be found in the care plan section)  Progress towards OT goals: Progressing toward goals  Acute Rehab OT Goals Patient Stated Goal: to go home OT Goal Formulation: With patient Time For Goal Achievement: 11/03/22 Potential to Achieve Goals: Poor  Plan Discharge plan remains appropriate    Co-evaluation    PT/OT/SLP Co-Evaluation/Treatment: Yes Reason for Co-Treatment: To address functional/ADL transfers;For patient/therapist safety          AM-PAC OT "6 Clicks" Daily Activity     Outcome Measure   Help from another person  eating meals?: A Little Help from another person taking care of personal grooming?: A Little Help from another person toileting, which includes using toliet, bedpan, or urinal?: Total Help from another person bathing (including washing, rinsing, drying)?: A Lot Help from another person to put on and taking off regular upper body clothing?: A Lot Help from another person to put on and taking off regular lower body clothing?: Total 6 Click Score: 12    End of Session    OT Visit Diagnosis: Unsteadiness on feet (R26.81);Pain Pain - Right/Left: Left Pain - part of body: Shoulder;Hip;Leg;Arm   Activity Tolerance Patient tolerated treatment well   Patient Left in bed;with bed alarm set   Nurse  Communication Mobility status        Time: 3154-0086 OT Time Calculation (min): 32 min  Charges: OT General Charges $OT Visit: 1 Visit OT Treatments $Self Care/Home Management : 8-22 mins  Gustavo Lah, OTR/L Acute Care Rehab Services  Office [phone]   Michelle [name] 10/23/2022, 4:46 PM

## 2022-10-23 NOTE — Progress Notes (Signed)
Patient ID: Michelle [name], female   DOB: 1961/02/02, 61 y.o.   MRN: [id number] S: No new complaints this morning.  She declined surgical intervention on her pathologic fractures for now because she is "not ready yet". O:BP 121/72 (BP Location: Right Arm)   Pulse (!) 104   Temp 98 F (36.7 C) (Oral)   Resp 18   Ht 5' 4.25" (1.632 m)   Wt 78.8 kg   SpO2 99%   BMI 29.59 kg/m   Intake/Output Summary (Last 24 hours) at 10/23/2022 1202 Last data filed at 10/23/2022 1113 Gross per 24 hour  Intake 1385.53 ml  Output 575 ml  Net 810.53 ml   Intake/Output: I/O last 3 completed shifts: In: 1555.5 [P.O.:120; I.V.:1223.3; IV Piggyback:212.3] Out: 775 [Urine:775]  Intake/Output this shift:  Total I/O In: -  Out: 275 [Urine:275] Weight change:  QJS:IDXFP in bed in NAD CVS: Tachy Resp: CTA Abd: +BS, soft, NT/ND Ext: no edema  Recent Labs  Lab 10/17/22 1712 10/17/22 2202 10/18/22 0849 10/19/22 0556 10/20/22 0500 10/21/22 0529 10/22/22 0554 10/23/22 0600  NA 137  --  140 139 138 139 141 144  K 5.1  --  4.7 5.0 4.9 4.9 4.6 4.7  CL 100  --  108 108 108 109 113* 116*  CO2 22  --  21* 20* 19* 19* 19* 20*  GLUCOSE 103*  --  95 147* 128* 82 88 84  BUN 51*  --  52* 59* 65* 75* 74* 63*  CREATININE 7.72* 7.16* 7.09* 6.96* 6.79* 6.83* 6.99* 6.47*  ALBUMIN  --   --   --  2.8* 2.7* 3.0* 2.8* 2.4*  CALCIUM 10.2  --  9.4 9.1 8.9 9.4 9.9 9.5  PHOS  --   --   --  5.5* 5.5* 5.3* 5.4*  --   AST  --   --   --  _0 ALT  --   --   --  _1 Liver Function Tests: Recent Labs  Lab 10/21/22 0529 10/22/22 0554 10/23/22 0600  AST _2 ALT _3 ALKPHOS 66 57 48  BILITOT 0.5 0.4 0.6  PROT 6.1* 5.8* 5.2*  ALBUMIN 3.0* 2.8* 2.4*   No results for input(s): "LIPASE", "AMYLASE" in the last 168 hours. No results for input(s): "AMMONIA" in the last 168 hours. CBC: Recent Labs  Lab 10/19/22 0515 10/20/22 0500 10/21/22 0529 10/22/22 0554 10/23/22 0600  WBC 6.5  11.5* 6.7 8.3 6.7  HGB 8.4* 8.4* 8.9* 8.9* 7.7*  HCT 26.5* 26.3* 27.7* 27.7* 24.2*  MCV 86.9 87.1 86.3 86.3 86.7  PLT 193 192 174 179 160   Cardiac Enzymes: No results for input(s): "CKTOTAL", "CKMB", "CKMBINDEX", "TROPONINI" in the last 168 hours. CBG: No results for input(s): "GLUCAP" in the last 168 hours.  Iron Studies: No results for input(s): "IRON", "TIBC", "TRANSFERRIN", "FERRITIN" in the last 72 hours. Studies/Results: DG HIP UNILAT WITH PELVIS 2-3 VIEWS LEFT  Result Date: 10/22/2022 CLINICAL DATA:  Left groin pain.  History of multiple myeloma. EXAM: DG HIP (WITH OR WITHOUT PELVIS) 2-3V LEFT COMPARISON:  CT abdomen 10/19/2022, CT abdomen pelvis 07/18/2022 FINDINGS: Compared to 07/18/2022 CT, there is a new lytic lesion with destruction of the left lesser trochanter. The tip of the left lesser trochanter is superiorly displaced approximately 1.8 cm. This is suspicious for a bone metastasis and pathologic avulsion. Multiple additional bilateral proximal femoral lucencies are again seen  consistent with lytic metastases. The largest is seen within the right femoral neck measuring up to approximately 3.8 cm in diameter, similar to 07/18/2022 CT. Multiple lytic lesions are again seen throughout the bilateral superior and inferior pubic rami and bilateral iliac bones, although better visualized on prior CT. On prior 07/18/2022 CT, the largest was with in the left ilium, and there is diffuse lucency within the superior left ilium corresponding to this lesion. IMPRESSION: 1. Compared to 07/18/2022 CT, there is a new left lesser trochanter lytic lesion consistent with a bone metastasis and pathologic displaced avulsion fracture. 2. Multiple additional lytic lesions are again seen throughout the bilateral superior and inferior pubic rami and bilateral iliac bones, as on prior CT and consistent with reported history of multiple myeloma. Electronically Signed   By: Yvonne Kendall M.D.   On: 10/22/2022  11:16    Chlorhexidine Gluconate Cloth  6 each Topical QHS   heparin injection (subcutaneous)  5,000 Units Subcutaneous Q8H   lactulose  20 g Oral BID   levothyroxine  88 mcg Oral QAC breakfast   megestrol  400 mg Oral BID   metoprolol tartrate  12.5 mg Oral BID   morphine  15 mg Oral Q12H   rosuvastatin  10 mg Oral Daily   senna-docusate  2 tablet Oral BID    BMET    Component Value Date/Time   NA 144 10/23/2022 0600   K 4.7 10/23/2022 0600   CL 116 (H) 10/23/2022 0600   CO2 20 (L) 10/23/2022 0600   GLUCOSE 84 10/23/2022 0600   BUN 63 (H) 10/23/2022 0600   CREATININE 6.47 (H) 10/23/2022 0600   CREATININE 2.22 (H) 10/11/2022 0848   CALCIUM 9.5 10/23/2022 0600   GFRNONAA 7 (L) 10/23/2022 0600   GFRNONAA 24 (L) 10/11/2022 0848   GFRAA 51 (L) 07/12/2015 1942   CBC    Component Value Date/Time   WBC 6.7 10/23/2022 0600   RBC 2.79 (L) 10/23/2022 0600   HGB 7.7 (L) 10/23/2022 0600   HGB 10.7 (L) 10/11/2022 0848   HCT 24.2 (L) 10/23/2022 0600   PLT 160 10/23/2022 0600   PLT 246 10/11/2022 0848   MCV 86.7 10/23/2022 0600   MCH 27.6 10/23/2022 0600   MCHC 31.8 10/23/2022 0600   RDW 16.1 (H) 10/23/2022 0600   LYMPHSABS 1.2 10/11/2022 0848   MONOABS 3.5 (H) 10/11/2022 0848   EOSABS 0.1 10/11/2022 0848   BASOSABS 0.0 10/11/2022 0848    Assessment/ Plan: AKI on CKD 3b - b/l creat 1.53- 2.26 from sept - nov 2023, eGFR 29- 38 ml/min. Creat here 7.7 on admission in setting of multiple myeloma w/ high LC's. UA  negative. CT abd noncontrast showed no GU obstruction. Suspect AKI / CKD due to myeloma kidney. IVF"s dc'd, agree, she is eating and euvolemic on exam. Treatment is chemoRx. Per oncology is starting new chemoRx with cytoxan and carfilzomib, given 12/16. Scr improved since admission.  UOP has dropped off since stopping IVF's and poor po intake.  IVF's restarted 10/22/22 with increased UOP and improved BUN/Cr.  She is a poor candidate for long-term dialysis given her MM and poor  functional and nutritional status.  Hopefully her renal function will improve with treatment of her myeloma.  No uremic issues at this time. Will follow.   Avoid nephrotoxic medications including NSAIDs and iodinated intravenous contrast exposure unless the latter is absolutely indicated.   Preferred narcotic agents for pain control are hydromorphone, fentanyl, and methadone. Morphine should not  be used.  Avoid Baclofen and avoid oral sodium phosphate and magnesium citrate based laxatives / bowel preps.  Continue strict Input and Output monitoring. Will monitor the patient closely with you and intervene or adjust therapy as indicated by changes in clinical status/labs  L humerus fracture - pathologic due to MM.  Seen by Ortho today and plan for surgical intervention this week. Multiple myeloma - diagnosed June 2023 w/ extensive spinal disease. Recent admit showed malignant R pleural effusion. F/b Dr Marin [place] - pt has sig complications from her aggressive cancer/ myeloma and now impending end-stage kidney failure is a real possibility. Have d/w pt briefly. Recommend Palliative care team to be consulted for [place].  Donetta Potts, MD [facility]

## 2022-10-23 NOTE — TOC Initial Note (Signed)
Transition of Care [facility]) - Initial/Assessment Note    Patient Details  Name: Michelle [name] MRN: [id number] Date of Birth: September 11, 1961  Transition of [facility]) CM/SW Contact:    Angelita Ingles, RN Phone Number:[phone]  10/23/2022, 2:01 PM  Clinical Narrative:                 [facility] consulted for patient with high risk for readmission. Home health / DME needs. Patient is from home where she normally manages independently with some assistance. Patient does have a PCP and is followed at the [facility]. Patient reports that she does follow up frequently. Current home DME includes walker and cane.  Patient does report that she has access to her medications and that they are affordable. Patient does have transportation to appointments.CM offered patient choice for home health services. Patient has no preference. Home health referral has been accepted by [facility] with Alvis Lemmings. AVS updated. DME wheelchair referral has been accepted by [facility]. Wheelchair to be delivered to bedside prior to discharge. Current plan is for patient to go home with home health services. TOC will continue to follow for disposition planning.   Planned Disposition: Home with Health Care Svc Barriers to Discharge: Continued Medical Work up   Patient Goals and CMS Choice Patient states their goals for this hospitalization and ongoing recovery are:: Wants to get stronger CMS Medicare.gov Compare Post Acute Care list provided to:: Patient Choice offered to / list presented to : Patient [place] ownership interest in [facility].provided to::  (n/a)    Expected Discharge Plan and Services Planned Disposition: Home with Health Care Svc In-house Referral: NA Discharge Planning Services: CM Consult Post Acute Care Choice: [place] arrangements for the past 2 months: [place]                 DME Arranged: Wheelchair manual DME Agency: [employer] Date DME Agency Contacted:  10/23/22 Time DME Agency Contacted: [id number] Representative spoke with at DME Agency: Melene Muller HH Arranged: PT, OT [place] Agency: [place] Date [place]: 10/23/22 Time [place]: 59 Representative spoke with at [place]: Tommi Rumps  Prior Living Arrangements/Services Living arrangements for the past 2 months: [place] Lives with:: Self Patient language and need for interpreter reviewed:: Yes Do you feel safe going back to the place where you live?: Yes      Need for Family Participation in Patient Care: No (Comment) Care giver support system in place?: Yes (comment) Current home services: DME Criminal Activity/Legal Involvement Pertinent to Current Situation/Hospitalization: No - Comment as needed  Activities of Daily Living Home Assistive Devices/Equipment: Other (Comment) (cane) ADL Screening (condition at time of admission) Patient's cognitive ability adequate to safely complete daily activities?: Yes Is the patient deaf or have difficulty hearing?: No Does the patient have difficulty seeing, even when wearing glasses/contacts?: No Does the patient have difficulty concentrating, remembering, or making decisions?: No Patient able to express need for assistance with ADLs?: Yes Does the patient have difficulty dressing or bathing?: Yes Independently performs ADLs?: No Communication: Independent Dressing (OT): Needs assistance Is this a change from baseline?: Change from baseline, expected to last >3 days (patient has fractured left humerus) Grooming: Needs assistance Is this a change from baseline?: Pre-admission baseline Feeding: Independent Bathing: Needs assistance Is this a change from baseline?: Change from baseline, expected to last >3 days (patient has fractured left humerus) Toileting: Needs assistance Is this a  change from baseline?: Pre-admission baseline In/Out Bed: Needs assistance Is this a change from baseline?: Change  from baseline, expected to last >3 days (fractured left humerus) Walks in Home: Independent with device (comment) (cane) Does the patient have difficulty walking or climbing stairs?: No Weakness of Legs: Right Weakness of Arms/Hands: Left  Permission Sought/Granted Permission sought to share information with : Family Supports Permission granted to share information with : No              Emotional Assessment Appearance:: Appears older than stated age Attitude/Demeanor/Rapport: Gracious Affect (typically observed): Accepting Orientation: : Oriented to Self, Oriented to Place, Oriented to  Time, Oriented to Situation Alcohol / Substance Use: Not Applicable Psych Involvement: No (comment)  Admission diagnosis:  Dehydration [E86.0] AKI (acute kidney injury) ([place]) [N17.9] Acute renal failure, unspecified acute renal failure type ([place]) [N17.9] Multiple myeloma, remission status unspecified ([place]) [C90.00] Pathological fracture of left humerus due to neoplastic disease, initial encounter [N23.557D] Patient Active Problem List   Diagnosis Date Noted   Palliative care by specialist 10/21/2022   Goals of care, counseling/discussion 10/21/2022   Malignant pleural effusion 10/01/2022   Stage 3a chronic kidney disease (CKD) ([place]) 10/01/2022   GERD (gastroesophageal reflux disease) 10/01/2022   Anxiety 10/01/2022   Anemia of chronic renal failure, stage 3b ([place]) 08/02/2022   Pleural effusion 06/25/2022   Multiple myeloma without remission ([place]) 06/20/2022   Intractable pain 06/14/2022   Hypercalcemia 06/14/2022   AKI (acute kidney injury) ([place]) 22/12/5425   Metabolic acidosis 04/27/7627   Tachycardia 06/14/2022   Metastasis to bone ([place]) 06/10/2022   Refractory anemia ([place]) 05/17/2022   Hyperlipidemia 02/16/2017   Morbid obesity ([place]) 02/16/2017   Essential hypertension 02/16/2017   Hypothyroidism 02/16/2017   PCP:  Leeroy Cha, MD Pharmacy:   [id number] [address] [place]Phone: [phone]: [phone]    Social Determinants of Health (SDOH) Social History: SDOH Screenings   Food Insecurity: No Food Insecurity (10/18/2022)  Housing: Low Risk  (10/18/2022)  Transportation Needs: No Transportation Needs (10/18/2022)  Utilities: Not At Risk (10/18/2022)  Tobacco Use: Medium Risk (10/22/2022)   SDOH Interventions: Housing Interventions: Intervention Not Indicated   Readmission Risk Interventions    10/23/2022    1:47 PM 10/03/2022    8:34 AM  Readmission Risk Prevention Plan  Transportation Screening Complete Complete  PCP or Specialist Appt within 3-5 Days  Complete  HRI or HPinal Complete  Social Work Consult for [place]anning/Counseling  Complete  Palliative Care Screening  Not Applicable  Medication Review (Press photographer Complete Complete  PCP or Specialist appointment within 3-5 days of discharge Complete   HRI or HApplegateComplete   SW Recovery Care/Counseling Consult Complete   PAtwaterNot Applicable

## 2022-10-23 NOTE — Progress Notes (Signed)
PROGRESS NOTE    Michelle [name]  KDX:[id number] DOB: 05-May-1961 DOA: 10/17/2022 PCP: Leeroy Cha, MD   Brief Narrative:  61 y.o. female with medical history significant for CKD 3B, multiple myeloma and malignant pleural effusion status post talc pleurodesis, status post radiation therapy to the skull base, treated at cancer center, who initially presented to radiation oncology at the cancer center with complaints of generalized weakness, left arm pain, and vomiting. . X-rays were done. The patient was informed she had a humerus fracture and was advised to go to the ED for further management.  In the ED, workup revealed left humerus fracture, AKI and sinus tachycardia.  EDP discussed the case with orthopedic surgery, Dr. Marlou Sa.  Orthopedic surgery recommended outpatient follow-up on Monday 10/18/2022. The patient prefers to be admitted at [facility] rather than [facility]  Admitted by [facility], hospitalist service.  Oncology consulted and on board. Nephrology on board for AKI. Dr Marlou Sa with orthopedics reconsulted to see if she needs surgical repair of the left humerus fracture.   Assessment & Plan:  Principal Problem:   AKI (acute kidney injury) ([place]) Active Problems:   Palliative care by specialist   Goals of care, counseling/discussion       AKI on CKD stage IIIb In the setting of multiple myeloma.  CT scan does not show any evidence of obstruction or hydronephrosis.  Baseline creatinine is around 2.2 but during admission creatinine peaked at 7.7.  Nephrology team was consulted.  Avoid nephrotoxic drugs at this point. Creatinine today 6.4   Multiple Myeloma:  She has light chain myeloma.  Diagnosed in June 2023 with extensive spinal disease.  Dr Marin Olp on board and she completed cytoxan and carfilzomib this weekend.    Malignant right pleural effusion:  CXR shows  Right pleural effusion has progressed. Pleural based density right upper lobe appears slightly larger. She is on  RA and does not appear to be symptomatic from the pleural effusion.  Appears comfortable.    Let humerus fracture:  - continue with splint.  Seen by [name], Dr Marlou Sa.  Surgery offered but patient is very hesitant at this point.  There is also lytic lesion in her hip which is at risk of pathologic fracture.   Pain control.  Bowel regimen.    Sinus tachycardia:  continue with lopressor 12.5 mg BID.  IV as needed     Hypothyroidism; Resume synthroid 88 mcg daily.     Anemia of chronic disease: Hemoglobin around 8. Transfuse to keep hemoglobin greater than 7.    In view of her aggressive MM, pathological fracture, AKI, palliative care consulted for goals of care. Appreciate recommendations.      Estimated body mass index is 29.59 kg/m as calculated from the following:   Height as of this encounter: 5' 4.25" (1.632 m).   Weight as of this encounter: 78.8 kg.   [place]al PT/OT - pending   DVT prophylaxis: heparin injection 5,000 Units Start: 10/22/22 2100 Code Status: Full Family Communication:    Status is: Inpatient Adamant she doesn't want surgery and will continue conservative care at this time     Subjective: Adamant doesn't want surgery, and wants to continue conservative care She is willing to participate in therapy.    Examination:  General exam: Appears calm and comfortable  Respiratory system: Clear to auscultation. Respiratory effort normal. Cardiovascular system: S1 & S2 heard, RRR. No JVD, murmurs, rubs, gallops or clicks. No pedal edema. Gastrointestinal system: Abdomen is nondistended, soft and  nontender. No organomegaly or masses felt. Normal bowel sounds heard. Central nervous system: Alert and oriented. No focal neurological deficits. Extremities: Symmetric 5 x 5 power. Skin: No rashes, lesions or ulcers Psychiatry: Judgement and insight appear normal. Mood & affect appropriate.   Left chest port in place.   Objective: Vitals:   10/22/22 1414 10/22/22  1502 10/22/22 1947 10/23/22 0615  BP: (!) 146/78 (!) 142/78 132/76 121/72  Pulse: (!) 117 99 (!) 109 (!) 104  Resp: _0 Temp: 98.2 F (36.8 C)  98 F (36.7 C) 98 F (36.7 C)  TempSrc: Oral  Oral Oral  SpO2: 97% 100% 99% 99%  Weight:      Height:        Intake/Output Summary (Last 24 hours) at 10/23/2022 0816 Last data filed at 10/23/2022 8546 Gross per 24 hour  Intake 1385.53 ml  Output 575 ml  Net 810.53 ml   Filed Weights   10/20/22 2110 10/21/22 0524 10/22/22 0559  Weight: 81.6 kg 82 kg 78.8 kg     Data Reviewed:   CBC: Recent Labs  Lab 10/19/22 0515 10/20/22 0500 10/21/22 0529 10/22/22 0554 10/23/22 0600  WBC 6.5 11.5* 6.7 8.3 6.7  HGB 8.4* 8.4* 8.9* 8.9* 7.7*  HCT 26.5* 26.3* 27.7* 27.7* 24.2*  MCV 86.9 87.1 86.3 86.3 86.7  PLT 193 192 174 179 270   Basic Metabolic Panel: Recent Labs  Lab 10/19/22 0556 10/20/22 0500 10/21/22 0529 10/22/22 0554 10/23/22 0600  NA 139 138 139 141 144  K 5.0 4.9 4.9 4.6 4.7  CL 108 108 109 113* 116*  CO2 20* 19* 19* 19* 20*  GLUCOSE 147* 128* 82 88 84  BUN 59* 65* 75* 74* 63*  CREATININE 6.96* 6.79* 6.83* 6.99* 6.47*  CALCIUM 9.1 8.9 9.4 9.9 9.5  MG 2.7* 2.6* 2.4 2.4  --   PHOS 5.5* 5.5* 5.3* 5.4*  --    GFR: Estimated Creatinine Clearance: 9.1 mL/min (A) (by C-G formula based on SCr of 6.47 mg/dL (H)). Liver Function Tests: Recent Labs  Lab 10/19/22 0556 10/20/22 0500 10/21/22 0529 10/22/22 0554 10/23/22 0600  AST _1 ALT _2 ALKPHOS 67 63 66 57 48  BILITOT 0.6 0.4 0.5 0.4 0.6  PROT 5.8* 5.7* 6.1* 5.8* 5.2*  ALBUMIN 2.8* 2.7* 3.0* 2.8* 2.4*   No results for input(s): "LIPASE", "AMYLASE" in the last 168 hours. No results for input(s): "AMMONIA" in the last 168 hours. Coagulation Profile: No results for input(s): "INR", "PROTIME" in the last 168 hours. Cardiac Enzymes: No results for input(s): "CKTOTAL", "CKMB", "CKMBINDEX", "TROPONINI" in the last 168 hours. BNP (last  3 results) No results for input(s): "PROBNP" in the last 8760 hours. HbA1C: No results for input(s): "HGBA1C" in the last 72 hours. CBG: No results for input(s): "GLUCAP" in the last 168 hours. Lipid Profile: No results for input(s): "CHOL", "HDL", "LDLCALC", "TRIG", "CHOLHDL", "LDLDIRECT" in the last 72 hours. Thyroid Function Tests: No results for input(s): "TSH", "T4TOTAL", "FREET4", "T3FREE", "THYROIDAB" in the last 72 hours. Anemia Panel: No results for input(s): "VITAMINB12", "FOLATE", "FERRITIN", "TIBC", "IRON", "RETICCTPCT" in the last 72 hours. Sepsis Labs: No results for input(s): "PROCALCITON", "LATICACIDVEN" in the last 168 hours.  No results found for this or any previous visit (from the past 240 hour(s)).       Radiology Studies: DG HIP UNILAT WITH PELVIS 2-3 VIEWS LEFT  Result Date: 10/22/2022 CLINICAL DATA:  Left groin  pain.  History of multiple myeloma. EXAM: DG HIP (WITH OR WITHOUT PELVIS) 2-3V LEFT COMPARISON:  CT abdomen 10/19/2022, CT abdomen pelvis 07/18/2022 FINDINGS: Compared to 07/18/2022 CT, there is a new lytic lesion with destruction of the left lesser trochanter. The tip of the left lesser trochanter is superiorly displaced approximately 1.8 cm. This is suspicious for a bone metastasis and pathologic avulsion. Multiple additional bilateral proximal femoral lucencies are again seen consistent with lytic metastases. The largest is seen within the right femoral neck measuring up to approximately 3.8 cm in diameter, similar to 07/18/2022 CT. Multiple lytic lesions are again seen throughout the bilateral superior and inferior pubic rami and bilateral iliac bones, although better visualized on prior CT. On prior 07/18/2022 CT, the largest was with in the left ilium, and there is diffuse lucency within the superior left ilium corresponding to this lesion. IMPRESSION: 1. Compared to 07/18/2022 CT, there is a new left lesser trochanter lytic lesion consistent with a bone  metastasis and pathologic displaced avulsion fracture. 2. Multiple additional lytic lesions are again seen throughout the bilateral superior and inferior pubic rami and bilateral iliac bones, as on prior CT and consistent with reported history of multiple myeloma. Electronically Signed   By: Yvonne Kendall M.D.   On: 10/22/2022 11:16        Scheduled Meds:  Chlorhexidine Gluconate Cloth  6 each Topical QHS   epoetin alfa  40,000 Units Subcutaneous Once   heparin injection (subcutaneous)  5,000 Units Subcutaneous Q8H   lactulose  20 g Oral BID   levothyroxine  88 mcg Oral QAC breakfast   megestrol  400 mg Oral BID   metoprolol tartrate  12.5 mg Oral BID   morphine  15 mg Oral Q12H   rosuvastatin  10 mg Oral Daily   senna-docusate  2 tablet Oral BID   Continuous Infusions:  sodium chloride 75 mL/hr at 10/23/22 0628   ondansetron 8 mg (10/23/22 0653)     LOS: 6 days   Time spent= 35 mins     Arsenio Loader, MD [facility]  If 7PM-7AM, please contact night-coverage  10/23/2022, 8:16 AM

## 2022-10-23 NOTE — Progress Notes (Signed)
Patient suffers from generalized weakness and multiple myeloma which impairs their ability to perform daily activities like bathing, dressing, feeding, grooming, and toileting in the home.  A cane, crutch, or walker will not resolve issue with performing activities of daily living. A wheelchair will allow patient to safely perform daily activities. Patient can safely propel the wheelchair in the home or has a caregiver who can provide assistance. Length of need Lifetime.  Accessories: elevating leg rests (ELRs), wheel locks, extensions and anti-tippers.

## 2022-10-24 DIAGNOSIS — N179 Acute kidney failure, unspecified: Secondary | ICD-10-CM | POA: Diagnosis not present

## 2022-10-24 LAB — COMPREHENSIVE METABOLIC PANEL
ALT: 11 U/L (ref 0–44)
AST: 18 U/L (ref 15–41)
Albumin: 2.2 g/dL — ABNORMAL LOW (ref 3.5–5.0)
Alkaline Phosphatase: 49 U/L (ref 38–126)
Anion gap: 9 (ref 5–15)
BUN: 56 mg/dL — ABNORMAL HIGH (ref 8–23)
CO2: 18 mmol/L — ABNORMAL LOW (ref 22–32)
Calcium: 9.7 mg/dL (ref 8.9–10.3)
Chloride: 116 mmol/L — ABNORMAL HIGH (ref 98–111)
Creatinine, Ser: 6.15 mg/dL — ABNORMAL HIGH (ref 0.44–1.00)
GFR, Estimated: 7 mL/min — ABNORMAL LOW (ref >60)
Glucose, Bld: 80 mg/dL (ref 70–99)
Potassium: 4.6 mmol/L (ref 3.5–5.1)
Sodium: 143 mmol/L (ref 135–145)
Total Bilirubin: 0.8 mg/dL (ref 0.3–1.2)
Total Protein: 5.2 g/dL — ABNORMAL LOW (ref 6.5–8.1)

## 2022-10-24 LAB — CBC WITH DIFFERENTIAL/PLATELET
Abs Immature Granulocytes: 0.04 10*3/uL (ref 0.00–0.07)
Basophils Absolute: 0 10*3/uL (ref 0.0–0.1)
Basophils Relative: 0 %
Eosinophils Absolute: 0.1 10*3/uL (ref 0.0–0.5)
Eosinophils Relative: 1 %
HCT: 26.1 % — ABNORMAL LOW (ref 36.0–46.0)
Hemoglobin: 8.3 g/dL — ABNORMAL LOW (ref 12.0–15.0)
Immature Granulocytes: 1 %
Lymphocytes Relative: 15 %
Lymphs Abs: 1.2 10*3/uL (ref 0.7–4.0)
MCH: 27.8 pg (ref 26.0–34.0)
MCHC: 31.8 g/dL (ref 30.0–36.0)
MCV: 87.3 fL (ref 80.0–100.0)
Monocytes Absolute: 1.5 10*3/uL — ABNORMAL HIGH (ref 0.1–1.0)
Monocytes Relative: 19 %
Neutro Abs: 4.9 10*3/uL (ref 1.7–7.7)
Neutrophils Relative %: 64 %
Platelets: 159 10*3/uL (ref 150–400)
RBC: 2.99 MIL/uL — ABNORMAL LOW (ref 3.87–5.11)
RDW: 16.4 % — ABNORMAL HIGH (ref 11.5–15.5)
WBC: 7.6 10*3/uL (ref 4.0–10.5)
nRBC: 0.5 % — ABNORMAL HIGH (ref 0.0–0.2)

## 2022-10-24 LAB — LACTATE DEHYDROGENASE: LDH: 206 U/L — ABNORMAL HIGH (ref 98–192)

## 2022-10-24 LAB — MAGNESIUM: Magnesium: 1.9 mg/dL (ref 1.7–2.4)

## 2022-10-24 NOTE — Progress Notes (Signed)
Patient ID: Michelle [name], female   DOB: December 20, 1960, 61 y.o.   MRN: [id number] S: No new complaints.  Still not ready to have surgical repair of pathologic fractures.  Wants to go home but doesn't understand that she is non-weightbearing until she has surgery. O:BP (!) 142/85 (BP Location: Right Arm)   Pulse (!) 108   Temp 98.3 F (36.8 C) (Oral)   Resp 18   Ht 5' 4.25" (1.632 m)   Wt 80.1 kg   SpO2 100%   BMI 30.08 kg/m   Intake/Output Summary (Last 24 hours) at 10/24/2022 0924 Last data filed at 10/24/2022 0543 Gross per 24 hour  Intake --  Output 1525 ml  Net -1525 ml   Intake/Output: I/O last 3 completed shifts: In: 1385.5 [I.V.:1223.3; IV Piggyback:162.3] Out: 1525 [PVXYI:0165]  Intake/Output this shift:  No intake/output data recorded. Weight change:  Gen: NAD CVS: RRR Resp: CTA Abd: +BS, soft, NT/ND Ext: trace pre-sacral edema  Recent Labs  Lab 10/18/22 0849 10/19/22 0556 10/20/22 0500 10/21/22 0529 10/22/22 0554 10/23/22 0600 10/24/22 0500  NA 140 139 138 139 141 144 143  K 4.7 5.0 4.9 4.9 4.6 4.7 4.6  CL 108 108 108 109 113* 116* 116*  CO2 21* 20* 19* 19* 19* 20* 18*  GLUCOSE 95 147* 128* 82 88 84 80  BUN 52* 59* 65* 75* 74* 63* 56*  CREATININE 7.09* 6.96* 6.79* 6.83* 6.99* 6.47* 6.15*  ALBUMIN  --  2.8* 2.7* 3.0* 2.8* 2.4* 2.2*  CALCIUM 9.4 9.1 8.9 9.4 9.9 9.5 9.7  PHOS  --  5.5* 5.5* 5.3* 5.4*  --   --   AST  --  _0 ALT  --  _1 Liver Function Tests: Recent Labs  Lab 10/22/22 0554 10/23/22 0600 10/24/22 0500  AST _2 ALT _3 ALKPHOS 57 48 49  BILITOT 0.4 0.6 0.8  PROT 5.8* 5.2* 5.2*  ALBUMIN 2.8* 2.4* 2.2*   No results for input(s): "LIPASE", "AMYLASE" in the last 168 hours. No results for input(s): "AMMONIA" in the last 168 hours. CBC: Recent Labs  Lab 10/20/22 0500 10/21/22 0529 10/22/22 0554 10/23/22 0600 10/24/22 0500  WBC 11.5* 6.7 8.3 6.7 7.6  NEUTROABS  --   --   --   --  4.9   HGB 8.4* 8.9* 8.9* 7.7* 8.3*  HCT 26.3* 27.7* 27.7* 24.2* 26.1*  MCV 87.1 86.3 86.3 86.7 87.3  PLT 192 174 179 160 159   Cardiac Enzymes: No results for input(s): "CKTOTAL", "CKMB", "CKMBINDEX", "TROPONINI" in the last 168 hours. CBG: No results for input(s): "GLUCAP" in the last 168 hours.  Iron Studies: No results for input(s): "IRON", "TIBC", "TRANSFERRIN", "FERRITIN" in the last 72 hours. Studies/Results: DG HIP UNILAT WITH PELVIS 2-3 VIEWS LEFT  Result Date: 10/22/2022 CLINICAL DATA:  Left groin pain.  History of multiple myeloma. EXAM: DG HIP (WITH OR WITHOUT PELVIS) 2-3V LEFT COMPARISON:  CT abdomen 10/19/2022, CT abdomen pelvis 07/18/2022 FINDINGS: Compared to 07/18/2022 CT, there is a new lytic lesion with destruction of the left lesser trochanter. The tip of the left lesser trochanter is superiorly displaced approximately 1.8 cm. This is suspicious for a bone metastasis and pathologic avulsion. Multiple additional bilateral proximal femoral lucencies are again seen consistent with lytic metastases. The largest is seen within the right femoral neck measuring up to approximately 3.8 cm in diameter, similar to 07/18/2022 CT. Multiple  lytic lesions are again seen throughout the bilateral superior and inferior pubic rami and bilateral iliac bones, although better visualized on prior CT. On prior 07/18/2022 CT, the largest was with in the left ilium, and there is diffuse lucency within the superior left ilium corresponding to this lesion. IMPRESSION: 1. Compared to 07/18/2022 CT, there is a new left lesser trochanter lytic lesion consistent with a bone metastasis and pathologic displaced avulsion fracture. 2. Multiple additional lytic lesions are again seen throughout the bilateral superior and inferior pubic rami and bilateral iliac bones, as on prior CT and consistent with reported history of multiple myeloma. Electronically Signed   By: Yvonne Kendall M.D.   On: 10/22/2022 11:16     Chlorhexidine Gluconate Cloth  6 each Topical QHS   heparin injection (subcutaneous)  5,000 Units Subcutaneous Q8H   lactulose  20 g Oral BID   levothyroxine  88 mcg Oral QAC breakfast   megestrol  400 mg Oral BID   metoprolol tartrate  12.5 mg Oral BID   morphine  15 mg Oral Q12H   rosuvastatin  10 mg Oral Daily   senna-docusate  2 tablet Oral BID    BMET    Component Value Date/Time   NA 143 10/24/2022 0500   K 4.6 10/24/2022 0500   CL 116 (H) 10/24/2022 0500   CO2 18 (L) 10/24/2022 0500   GLUCOSE 80 10/24/2022 0500   BUN 56 (H) 10/24/2022 0500   CREATININE 6.15 (H) 10/24/2022 0500   CREATININE 2.22 (H) 10/11/2022 0848   CALCIUM 9.7 10/24/2022 0500   GFRNONAA 7 (L) 10/24/2022 0500   GFRNONAA 24 (L) 10/11/2022 0848   GFRAA 51 (L) 07/12/2015 1942   CBC    Component Value Date/Time   WBC 7.6 10/24/2022 0500   RBC 2.99 (L) 10/24/2022 0500   HGB 8.3 (L) 10/24/2022 0500   HGB 10.7 (L) 10/11/2022 0848   HCT 26.1 (L) 10/24/2022 0500   PLT 159 10/24/2022 0500   PLT 246 10/11/2022 0848   MCV 87.3 10/24/2022 0500   MCH 27.8 10/24/2022 0500   MCHC 31.8 10/24/2022 0500   RDW 16.4 (H) 10/24/2022 0500   LYMPHSABS 1.2 10/24/2022 0500   MONOABS 1.5 (H) 10/24/2022 0500   EOSABS 0.1 10/24/2022 0500   BASOSABS 0.0 10/24/2022 0500     Assessment/ Plan: AKI on CKD 3b - b/l creat 1.53- 2.26 from sept - nov 2023, eGFR 29- 38 ml/min. Creat here 7.7 on admission in setting of multiple myeloma w/ high LC's. UA  negative. CT abd noncontrast showed no GU obstruction. Suspect AKI / CKD due to myeloma kidney. IVF"s dc'd, agree, she is eating and euvolemic on exam. Treatment is chemoRx. Per oncology is starting new chemoRx with cytoxan and carfilzomib, given 12/16. Scr improved since admission.  UOP has dropped off since stopping IVF's and poor po intake.  IVF's restarted 10/22/22 with increased UOP and improved BUN/Cr.  She is a poor candidate for long-term dialysis given her MM, multiple  co-morbidities, poor functional and nutritional status.  Hopefully her renal function will improve with treatment of her myeloma.  No uremic issues at this time. Will decrease IVF"s to 50 mL/hr and follow.   Avoid nephrotoxic medications including NSAIDs and iodinated intravenous contrast exposure unless the latter is absolutely indicated.   Preferred narcotic agents for pain control are hydromorphone, fentanyl, and methadone. Morphine should not be used.  Avoid Baclofen and avoid oral sodium phosphate and magnesium citrate based laxatives / bowel preps.  Continue strict Input and Output monitoring. Will monitor the patient closely with you and intervene or adjust therapy as indicated by changes in clinical status/labs  L humerus fracture - pathologic due to MM.  Seen by Ortho today and plan for surgical intervention this week. Multiple myeloma - diagnosed June 2023 w/ extensive spinal disease. Recent admit showed malignant R pleural effusion. F/b Dr Marin Olp [place] - pt has sig complications from her aggressive cancer/ myeloma and now impending end-stage kidney failure is a real possibility. Have d/w pt briefly. Recommend Palliative care team to be consulted for [address].  Donetta Potts, MD [facility]

## 2022-10-24 NOTE — Progress Notes (Signed)
I talked with Michelle [name]'s daughter about her mother's situation.  Particularly with the impending hip fractures.  She wants to hold off on surgery for now.  Please contact us if that disposition changes and she wants to undergo nailing of both femurs so that she can walk around without having to worry about fracturing them.

## 2022-10-24 NOTE — Progress Notes (Signed)
  Daily Progress Note   Patient Name: Michelle [name]       Date: 10/24/2022 DOB: 30-Aug-1961  Age: 61 y.o. MRN#: [id number] Attending Physician: Damita Lack, MD Primary Care Physician: Leeroy Cha, MD Admit Date: 10/17/2022 Length of Stay: 7 days  Patient last seen by palliative provider Dr. Rowe Pavy on 10/21/22. Patient continues to discuss goals for medical care with her current providers including oncologist, Dr. Marin Olp. Dr. Marin Olp also managing symptom regimen. PMT will continue to following along peripherally. Please reach out if any acute PMT needs develop. Thank you.   Chelsea Aus, DO Palliative Care Provider PMT # [phone]

## 2022-10-24 NOTE — TOC Progression Note (Addendum)
Transition of Care [facility]) - Progression Note    Patient Details  Name: DIMPLE BASTYR MRN: [id number] Date of Birth: [birth year]  Transition of Care [facility]) CM/SW [place], RN Phone Number:[phone]  10/24/2022, 12:03 PM  Clinical Narrative:    CM at bedside to discuss disposition with patient. Patient made aware of new SNF recommendation. Patient , daughter and cousin at bedside. Patient states that she does not want SNF and has support at home to provide 24 hour care. Patient wants to go home with home health and DME. Hospital bed and hoyer life have been ordered per [employer] [facility].    Planned Disposition: Home with Health Care Svc Barriers to Discharge: Continued Medical Work up  Expected Discharge Plan and Services In-house Referral: NA Discharge Planning Services: CM Consult Post Acute Care Choice: [place] arrangements for the past 2 months: Single Family Home                 DME Arranged: Wheelchair manual DME Agency: [employer] Date DME Agency Contacted: 10/23/22 Time DME Agency Contacted: [id number] Representative spoke with at DME Agency: Melene Muller HH Arranged: PT, OT [place] Agency: [place] Date [place]: 10/23/22 Time [place]: [id number] Representative spoke with at [place]: [place] ([place]) Interventions [place]: No Food Insecurity (10/18/2022)  Housing: Low Risk  (10/18/2022)  Transportation Needs: No Transportation Needs (10/18/2022)  Utilities: Not At Risk (10/18/2022)  Tobacco Use: Medium Risk (10/22/2022)    Readmission Risk Interventions    10/23/2022    1:47 PM 10/03/2022    8:34 AM  Readmission Risk Prevention Plan  Transportation Screening Complete Complete  PCP or Specialist Appt within 3-5 Days  Complete  HRI or [place]  Complete  Social Work Consult for [place] Planning/Counseling  [place]  Not Applicable  Medication Review [employer]) Complete Complete  PCP or Specialist appointment within 3-5 days of discharge Complete   HRI or [place] Complete   SW Recovery Care/Counseling Consult Complete   [place] Not Applicable

## 2022-10-24 NOTE — Progress Notes (Signed)
PROGRESS NOTE    Michelle [name]  PXT:[id number] DOB: 07-14-1961 DOA: 10/17/2022 PCP: Leeroy Cha, MD   Brief Narrative:  61 y.o. female with medical history significant for CKD 3B, multiple myeloma and malignant pleural effusion status post talc pleurodesis, status post radiation therapy to the skull base, treated at cancer center, who initially presented to radiation oncology at the cancer center with complaints of generalized weakness, left arm pain, and vomiting. . X-rays were done. The patient was informed she had a humerus fracture and was advised to go to the ED for further management.  In the ED, workup revealed left humerus fracture, AKI and sinus tachycardia.  EDP discussed the case with orthopedic surgery, Dr. Marlou Sa.  Orthopedic surgery recommended outpatient follow-up on Monday 10/18/2022. The patient prefers to be admitted at [facility] rather than [facility].  Admitted by [facility], hospitalist service.  Oncology consulted and on board. Nephrology on board for AKI. Dr Marlou Sa with orthopedics reconsulted to see if she needs surgical repair of the left humerus fracture. PT has recommended SNF but despite of extensive conversation with the patient and family, she is adamant to go home with home health instead of rehab facility.   Assessment & Plan:  Principal Problem:   AKI (acute kidney injury) ([place]) Active Problems:   Palliative care by specialist   Goals of care, counseling/discussion       AKI on CKD stage IIIb In the setting of multiple myeloma.  CT scan does not show any evidence of obstruction or hydronephrosis.  Baseline creatinine is around 2.2 but during admission creatinine peaked at 7.7.  Nephrology team was consulted.  Avoid nephrotoxic drugs at this point. Creatinine today 6.15 Cont IVF   Multiple Myeloma:  She has light chain myeloma.  Diagnosed in June 2023 with extensive spinal disease.  Dr Marin Olp on board and she completed cytoxan and carfilzomib this  weekend.    Malignant right pleural effusion:  CXR shows  Right pleural effusion has progressed. Pleural based density right upper lobe appears slightly larger. She is on RA and does not appear to be symptomatic from the pleural effusion.  Appears comfortable.    Let humerus fracture:  - continue with splint.  Seen by [name], Dr Marlou Sa.  Surgery offered but patient is very hesitant at this point.  There is also lytic lesion in her hip which is at risk of pathologic fracture.   Pain control.  Bowel regimen.    Sinus tachycardia:  continue with lopressor 12.5 mg BID.  IV as needed     Hypothyroidism; Resume synthroid 88 mcg daily.     Anemia of chronic disease: Hemoglobin around 8. Transfuse to keep hemoglobin greater than 7.    In view of her aggressive MM, pathological fracture, AKI, palliative care consulted for goals of care. Appreciate recommendations.      Estimated body mass index is 29.59 kg/m as calculated from the following:   Height as of this encounter: 5' 4.25" (1.632 m).   Weight as of this encounter: 78.8 kg.   PT/OT = SNF but family wanting to take her to Home. Not sure how they will be able to take care of her. She is a complete NWB   DVT prophylaxis: heparin injection 5,000 Units Start: 10/22/22 2100 Code Status: Full Family Communication: Daughter at bedside  Status is: Inpatient Patient is adamant that she wants to go home with home health and does not want to go to rehab facility.  She is also  adamant to proceed with conservative management and does not want any form or surgery at this time    Subjective: Seen and examined at bedside.  Patient does not have any complaints.  Tells me she feels wonderful. When I spoke with the patient about having to go to rehab given her significant pathologic fracture issues.  Patient is adamant that she would only go home and does not want to go to any skilled nursing facility. Daughter at bedside is also in agreement  with the patient    Examination:  Constitutional: Not in acute distress Respiratory: Clear to auscultation bilaterally Cardiovascular: Normal sinus rhythm, no rubs, has some presacral edema Abdomen: Nontender nondistended good bowel sounds Musculoskeletal: No edema noted Skin: No rashes seen Neurologic: CN 2-12 grossly intact.  And nonfocal Psychiatric: Normal judgment and insight. Alert and oriented x 3. Normal mood.  Left chest port in place.   Objective: Vitals:   10/23/22 0615 10/23/22 1302 10/23/22 2030 10/24/22 0553  BP: 121/72 (!) 145/85 (!) 152/85 (!) 142/85  Pulse: (!) 104 (!) 110 (!) 107 (!) 108  Resp: _0 Temp: 98 F (36.7 C) 98.2 F (36.8 C) 98.3 F (36.8 C) 98.3 F (36.8 C)  TempSrc: Oral Oral Oral Oral  SpO2: 99% 100% 100% 100%  Weight:    80.1 kg  Height:        Intake/Output Summary (Last 24 hours) at 10/24/2022 0830 Last data filed at 10/24/2022 0543 Gross per 24 hour  Intake --  Output 1525 ml  Net -1525 ml   Filed Weights   10/21/22 0524 10/22/22 0559 10/24/22 0553  Weight: 82 kg 78.8 kg 80.1 kg     Data Reviewed:   CBC: Recent Labs  Lab 10/20/22 0500 10/21/22 0529 10/22/22 0554 10/23/22 0600 10/24/22 0500  WBC 11.5* 6.7 8.3 6.7 7.6  NEUTROABS  --   --   --   --  4.9  HGB 8.4* 8.9* 8.9* 7.7* 8.3*  HCT 26.3* 27.7* 27.7* 24.2* 26.1*  MCV 87.1 86.3 86.3 86.7 87.3  PLT 192 174 179 160 323   Basic Metabolic Panel: Recent Labs  Lab 10/19/22 0556 10/20/22 0500 10/21/22 0529 10/22/22 0554 10/23/22 0600 10/24/22 0500  NA 139 138 139 141 144 143  K 5.0 4.9 4.9 4.6 4.7 4.6  CL 108 108 109 113* 116* 116*  CO2 20* 19* 19* 19* 20* 18*  GLUCOSE 147* 128* 82 88 84 80  BUN 59* 65* 75* 74* 63* 56*  CREATININE 6.96* 6.79* 6.83* 6.99* 6.47* 6.15*  CALCIUM 9.1 8.9 9.4 9.9 9.5 9.7  MG 2.7* 2.6* 2.4 2.4  --  1.9  PHOS 5.5* 5.5* 5.3* 5.4*  --   --    GFR: Estimated Creatinine Clearance: 9.6 mL/min (A) (by C-G formula based on  SCr of 6.15 mg/dL (H)). Liver Function Tests: Recent Labs  Lab 10/20/22 0500 10/21/22 0529 10/22/22 0554 10/23/22 0600 10/24/22 0500  AST _1 ALT _2 ALKPHOS 63 66 57 48 49  BILITOT 0.4 0.5 0.4 0.6 0.8  PROT 5.7* 6.1* 5.8* 5.2* 5.2*  ALBUMIN 2.7* 3.0* 2.8* 2.4* 2.2*   No results for input(s): "LIPASE", "AMYLASE" in the last 168 hours. No results for input(s): "AMMONIA" in the last 168 hours. Coagulation Profile: No results for input(s): "INR", "PROTIME" in the last 168 hours. Cardiac Enzymes: No results for input(s): "CKTOTAL", "CKMB", "CKMBINDEX", "TROPONINI" in the last 168 hours. BNP (last  3 results) No results for input(s): "PROBNP" in the last 8760 hours. HbA1C: No results for input(s): "HGBA1C" in the last 72 hours. CBG: No results for input(s): "GLUCAP" in the last 168 hours. Lipid Profile: No results for input(s): "CHOL", "HDL", "LDLCALC", "TRIG", "CHOLHDL", "LDLDIRECT" in the last 72 hours. Thyroid Function Tests: No results for input(s): "TSH", "T4TOTAL", "FREET4", "T3FREE", "THYROIDAB" in the last 72 hours. Anemia Panel: No results for input(s): "VITAMINB12", "FOLATE", "FERRITIN", "TIBC", "IRON", "RETICCTPCT" in the last 72 hours. Sepsis Labs: No results for input(s): "PROCALCITON", "LATICACIDVEN" in the last 168 hours.  No results found for this or any previous visit (from the past 240 hour(s)).       Radiology Studies: DG HIP UNILAT WITH PELVIS 2-3 VIEWS LEFT  Result Date: 10/22/2022 CLINICAL DATA:  Left groin pain.  History of multiple myeloma. EXAM: DG HIP (WITH OR WITHOUT PELVIS) 2-3V LEFT COMPARISON:  CT abdomen 10/19/2022, CT abdomen pelvis 07/18/2022 FINDINGS: Compared to 07/18/2022 CT, there is a new lytic lesion with destruction of the left lesser trochanter. The tip of the left lesser trochanter is superiorly displaced approximately 1.8 cm. This is suspicious for a bone metastasis and pathologic avulsion. Multiple additional  bilateral proximal femoral lucencies are again seen consistent with lytic metastases. The largest is seen within the right femoral neck measuring up to approximately 3.8 cm in diameter, similar to 07/18/2022 CT. Multiple lytic lesions are again seen throughout the bilateral superior and inferior pubic rami and bilateral iliac bones, although better visualized on prior CT. On prior 07/18/2022 CT, the largest was with in the left ilium, and there is diffuse lucency within the superior left ilium corresponding to this lesion. IMPRESSION: 1. Compared to 07/18/2022 CT, there is a new left lesser trochanter lytic lesion consistent with a bone metastasis and pathologic displaced avulsion fracture. 2. Multiple additional lytic lesions are again seen throughout the bilateral superior and inferior pubic rami and bilateral iliac bones, as on prior CT and consistent with reported history of multiple myeloma. Electronically Signed   By: Yvonne Kendall M.D.   On: 10/22/2022 11:16        Scheduled Meds:  Chlorhexidine Gluconate Cloth  6 each Topical QHS   heparin injection (subcutaneous)  5,000 Units Subcutaneous Q8H   lactulose  20 g Oral BID   levothyroxine  88 mcg Oral QAC breakfast   megestrol  400 mg Oral BID   metoprolol tartrate  12.5 mg Oral BID   morphine  15 mg Oral Q12H   rosuvastatin  10 mg Oral Daily   senna-docusate  2 tablet Oral BID   Continuous Infusions:  sodium chloride 75 mL/hr at 10/23/22 2107   ondansetron 8 mg (10/24/22 0549)     LOS: 7 days   Time spent= 35 mins    Ellizabeth Dacruz Arsenio Loader, MD [facility]  If 7PM-7AM, please contact night-coverage  10/24/2022, 8:30 AM

## 2022-10-25 ENCOUNTER — Other Ambulatory Visit: Payer: Self-pay | Admitting: Pharmacist

## 2022-10-25 ENCOUNTER — Inpatient Hospital Stay: Payer: BC Managed Care – PPO

## 2022-10-25 ENCOUNTER — Inpatient Hospital Stay: Payer: BC Managed Care – PPO | Admitting: Hematology & Oncology

## 2022-10-25 ENCOUNTER — Encounter: Payer: Self-pay | Admitting: *Deleted

## 2022-10-25 DIAGNOSIS — N179 Acute kidney failure, unspecified: Secondary | ICD-10-CM | POA: Diagnosis not present

## 2022-10-25 DIAGNOSIS — C9000 Multiple myeloma not having achieved remission: Secondary | ICD-10-CM

## 2022-10-25 LAB — COMPREHENSIVE METABOLIC PANEL
ALT: 12 U/L (ref 0–44)
AST: 18 U/L (ref 15–41)
Albumin: 2.5 g/dL — ABNORMAL LOW (ref 3.5–5.0)
Alkaline Phosphatase: 51 U/L (ref 38–126)
Anion gap: 9 (ref 5–15)
BUN: 52 mg/dL — ABNORMAL HIGH (ref 8–23)
CO2: 18 mmol/L — ABNORMAL LOW (ref 22–32)
Calcium: 9.4 mg/dL (ref 8.9–10.3)
Chloride: 119 mmol/L — ABNORMAL HIGH (ref 98–111)
Creatinine, Ser: 6.03 mg/dL — ABNORMAL HIGH (ref 0.44–1.00)
GFR, Estimated: 7 mL/min — ABNORMAL LOW (ref >60)
Glucose, Bld: 75 mg/dL (ref 70–99)
Potassium: 4.5 mmol/L (ref 3.5–5.1)
Sodium: 146 mmol/L — ABNORMAL HIGH (ref 135–145)
Total Bilirubin: 0.7 mg/dL (ref 0.3–1.2)
Total Protein: 5.4 g/dL — ABNORMAL LOW (ref 6.5–8.1)

## 2022-10-25 LAB — CBC WITH DIFFERENTIAL/PLATELET
Abs Immature Granulocytes: 0.04 10*3/uL (ref 0.00–0.07)
Basophils Absolute: 0 10*3/uL (ref 0.0–0.1)
Basophils Relative: 0 %
Eosinophils Absolute: 0.1 10*3/uL (ref 0.0–0.5)
Eosinophils Relative: 1 %
HCT: 24.8 % — ABNORMAL LOW (ref 36.0–46.0)
Hemoglobin: 7.7 g/dL — ABNORMAL LOW (ref 12.0–15.0)
Immature Granulocytes: 1 %
Lymphocytes Relative: 8 %
Lymphs Abs: 0.5 10*3/uL — ABNORMAL LOW (ref 0.7–4.0)
MCH: 27.7 pg (ref 26.0–34.0)
MCHC: 31 g/dL (ref 30.0–36.0)
MCV: 89.2 fL (ref 80.0–100.0)
Monocytes Absolute: 1.4 10*3/uL — ABNORMAL HIGH (ref 0.1–1.0)
Monocytes Relative: 21 %
Neutro Abs: 4.9 10*3/uL (ref 1.7–7.7)
Neutrophils Relative %: 69 %
Platelets: 151 10*3/uL (ref 150–400)
RBC: 2.78 MIL/uL — ABNORMAL LOW (ref 3.87–5.11)
RDW: 16.4 % — ABNORMAL HIGH (ref 11.5–15.5)
WBC: 6.9 10*3/uL (ref 4.0–10.5)
nRBC: 0.4 % — ABNORMAL HIGH (ref 0.0–0.2)

## 2022-10-25 LAB — SAMPLE TO BLOOD BANK

## 2022-10-25 LAB — MAGNESIUM: Magnesium: 1.9 mg/dL (ref 1.7–2.4)

## 2022-10-25 MED ORDER — DEXTROSE-NACL 5-0.45 % IV SOLN: INTRAVENOUS | Status: DC

## 2022-10-25 MED ORDER — DEXTROSE 5 % IV SOLN
27.0000 mg/m2 | Freq: Once | INTRAVENOUS | Status: AC
  Administered 2022-10-25: 56 mg via INTRAVENOUS
  Filled 2022-10-25: qty 28

## 2022-10-25 MED ORDER — FUROSEMIDE 10 MG/ML IJ SOLN: 20.0000 mg | Freq: Once | INTRAMUSCULAR | Status: DC

## 2022-10-25 MED ORDER — DEXTROSE 5 % IV SOLN: 27.0000 mg/m2 | Freq: Once | INTRAVENOUS | Status: DC

## 2022-10-25 MED ORDER — SODIUM CHLORIDE 0.9 % IV SOLN
400.0000 mg | Freq: Once | INTRAVENOUS | Status: AC
  Administered 2022-10-25: 400 mg via INTRAVENOUS
  Filled 2022-10-25: qty 20

## 2022-10-25 MED ORDER — SODIUM CHLORIDE 0.9 % IV SOLN: 400.0000 mg | Freq: Once | INTRAVENOUS | Status: DC

## 2022-10-25 MED ORDER — SODIUM CHLORIDE 0.9 % IV SOLN
20.0000 mg | Freq: Once | INTRAVENOUS | Status: AC
  Administered 2022-10-25: 20 mg via INTRAVENOUS
  Filled 2022-10-25: qty 2

## 2022-10-25 MED ORDER — SODIUM CHLORIDE 0.9% IV SOLUTION: Freq: Once | INTRAVENOUS | Status: DC

## 2022-10-25 MED ORDER — SODIUM CHLORIDE 0.9 % IV SOLN
20.0000 mg | Freq: Once | INTRAVENOUS | Status: DC
  Filled 2022-10-25: qty 2

## 2022-10-25 NOTE — Progress Notes (Signed)
PROGRESS NOTE    Michelle [name]  WCB:[id number] DOB: 01-15-1961 DOA: 10/17/2022 PCP: Leeroy Cha, MD   Brief Narrative:  61 y.o. female with medical history significant for CKD 3B, multiple myeloma and malignant pleural effusion status post talc pleurodesis, status post radiation therapy to the skull base, treated at cancer center, who initially presented to radiation oncology at the cancer center with complaints of generalized weakness, left arm pain, and vomiting. . X-rays were done. The patient was informed she had a humerus fracture and was advised to go to the ED for further management.  In the ED, workup revealed left humerus fracture, AKI and sinus tachycardia.  EDP discussed the case with orthopedic surgery, Dr. Marlou Sa.  Orthopedic surgery recommended outpatient follow-up on Monday 10/18/2022. The patient prefers to be admitted at [facility] rather than [facility].  Admitted by [facility], hospitalist service.  Oncology consulted and on board. Nephrology on board for AKI. Dr Marlou Sa with orthopedics reconsulted to see if she needs surgical repair of the left humerus fracture. PT has recommended SNF but despite of extensive conversation with the patient and family, she is adamant to go home with home health instead of rehab facility.   Assessment & Plan:  Principal Problem:   AKI (acute kidney injury) ([place]) Active Problems:   Palliative care by specialist   Goals of care, counseling/discussion       AKI on CKD stage IIIb In the setting of multiple myeloma.  CT scan does not show any evidence of obstruction or hydronephrosis.  Baseline creatinine is around 2.2 but during admission creatinine peaked at 7.7.  Nephrology team was consulted.  Avoid nephrotoxic drugs at this point. Creatinine today 6.03 Cont IVF. Due to risk in Na and cl, will change it to D5 1/2NS ,if continues to rise, we may have to use D5W.    Multiple Myeloma:  She has light chain myeloma.  Diagnosed in June 2023 with  extensive spinal disease.  Dr Marin Olp planning on chemo treatment tomorrow.    Malignant right pleural effusion:  CXR shows  Right pleural effusion has progressed. Pleural based density right upper lobe appears slightly larger. She is on RA and does not appear to be symptomatic from the pleural effusion.  Appears comfortable.    Let humerus fracture:  - continue with splint.  Seen by [name], Dr Marlou Sa.  Surgery offered but patient is very hesitant at this point.  There is also lytic lesion in her hip which is at risk of pathologic fracture.   Pain control.  Bowel regimen.    Sinus tachycardia:  continue with lopressor 12.5 mg BID.  IV as needed     Hypothyroidism; Resume synthroid 88 mcg daily.     Anemia of chronic disease: Hemoglobin around 8. Planned 1UPRBC today     Estimated body mass index is 29.59 kg/m as calculated from the following:   Height as of this encounter: 5' 4.25" (1.632 m).   Weight as of this encounter: 78.8 kg.   PT/OT = SNF but family wanting to take her to Home. Not sure how they will be able to take care of her. She is a complete NWB   DVT prophylaxis: heparin injection 5,000 Units Start: 10/22/22 2100 Code Status: Full Family Communication: Daughter at bedside  Status is: Inpatient Will dc patient home with [facility] once cleared by Oncology.     Subjective: Doing ok no complaints   Examination: Constitutional: Not in acute distress Respiratory: Clear to auscultation bilaterally  Cardiovascular: Normal sinus rhythm, no rubs Abdomen: Nontender nondistended good bowel sounds Musculoskeletal: No edema noted Skin: No rashes seen Neurologic: CN 2-12 grossly intact.  And nonfocal Psychiatric: Normal judgment and insight. Alert and oriented x 3. Normal mood.   Lft arm soft cast and sling Left chest port in place.   Objective: Vitals:   10/24/22 0553 10/24/22 1320 10/24/22 2133 10/25/22 0625  BP: (!) 142/85 125/71 136/81 122/86  Pulse: (!) 108 100  (!) 110 (!) 110  Resp: _0 Temp: 98.3 F (36.8 C) 98.6 F (37 C) 98.3 F (36.8 C) 98.1 F (36.7 C)  TempSrc: Oral Oral Oral Oral  SpO2: 100% 99% 98% 99%  Weight: 80.1 kg   80.1 kg  Height:        Intake/Output Summary (Last 24 hours) at 10/25/2022 0821 Last data filed at 10/25/2022 0435 Gross per 24 hour  Intake 980 ml  Output 600 ml  Net 380 ml   Filed Weights   10/22/22 0559 10/24/22 0553 10/25/22 0625  Weight: 78.8 kg 80.1 kg 80.1 kg     Data Reviewed:   CBC: Recent Labs  Lab 10/21/22 0529 10/22/22 0554 10/23/22 0600 10/24/22 0500 10/25/22 0500  WBC 6.7 8.3 6.7 7.6 6.9  NEUTROABS  --   --   --  4.9 4.9  HGB 8.9* 8.9* 7.7* 8.3* 7.7*  HCT 27.7* 27.7* 24.2* 26.1* 24.8*  MCV 86.3 86.3 86.7 87.3 89.2  PLT 174 179 160 159 161   Basic Metabolic Panel: Recent Labs  Lab 10/19/22 0556 10/20/22 0500 10/21/22 0529 10/22/22 0554 10/23/22 0600 10/24/22 0500 10/25/22 0500  NA 139 138 139 141 144 143 146*  K 5.0 4.9 4.9 4.6 4.7 4.6 4.5  CL 108 108 109 113* 116* 116* 119*  CO2 20* 19* 19* 19* 20* 18* 18*  GLUCOSE 147* 128* 82 88 84 80 75  BUN 59* 65* 75* 74* 63* 56* 52*  CREATININE 6.96* 6.79* 6.83* 6.99* 6.47* 6.15* 6.03*  CALCIUM 9.1 8.9 9.4 9.9 9.5 9.7 9.4  MG 2.7* 2.6* 2.4 2.4  --  1.9 1.9  PHOS 5.5* 5.5* 5.3* 5.4*  --   --   --    GFR: Estimated Creatinine Clearance: 9.8 mL/min (A) (by C-G formula based on SCr of 6.03 mg/dL (H)). Liver Function Tests: Recent Labs  Lab 10/21/22 0529 10/22/22 0554 10/23/22 0600 10/24/22 0500 10/25/22 0500  AST _1 ALT _2 ALKPHOS 66 57 48 49 51  BILITOT 0.5 0.4 0.6 0.8 0.7  PROT 6.1* 5.8* 5.2* 5.2* 5.4*  ALBUMIN 3.0* 2.8* 2.4* 2.2* 2.5*   No results for input(s): "LIPASE", "AMYLASE" in the last 168 hours. No results for input(s): "AMMONIA" in the last 168 hours. Coagulation Profile: No results for input(s): "INR", "PROTIME" in the last 168 hours. Cardiac Enzymes: No results  for input(s): "CKTOTAL", "CKMB", "CKMBINDEX", "TROPONINI" in the last 168 hours. BNP (last 3 results) No results for input(s): "PROBNP" in the last 8760 hours. HbA1C: No results for input(s): "HGBA1C" in the last 72 hours. CBG: No results for input(s): "GLUCAP" in the last 168 hours. Lipid Profile: No results for input(s): "CHOL", "HDL", "LDLCALC", "TRIG", "CHOLHDL", "LDLDIRECT" in the last 72 hours. Thyroid Function Tests: No results for input(s): "TSH", "T4TOTAL", "FREET4", "T3FREE", "THYROIDAB" in the last 72 hours. Anemia Panel: No results for input(s): "VITAMINB12", "FOLATE", "FERRITIN", "TIBC", "IRON", "RETICCTPCT" in the last 72 hours. Sepsis Labs: No results  for input(s): "PROCALCITON", "LATICACIDVEN" in the last 168 hours.  No results found for this or any previous visit (from the past 240 hour(s)).       Radiology Studies: No results found.      Scheduled Meds:  sodium chloride   Intravenous Once   Chlorhexidine Gluconate Cloth  6 each Topical QHS   furosemide  20 mg Intravenous Once   furosemide  20 mg Intravenous Once   heparin injection (subcutaneous)  5,000 Units Subcutaneous Q8H   lactulose  20 g Oral BID   levothyroxine  88 mcg Oral QAC breakfast   megestrol  400 mg Oral BID   metoprolol tartrate  12.5 mg Oral BID   rosuvastatin  10 mg Oral Daily   senna-docusate  2 tablet Oral BID   Continuous Infusions:  sodium chloride 50 mL/hr at 10/25/22 0429   ondansetron 8 mg (10/25/22 0514)     LOS: 8 days   Time spent= 35 mins    Vannie Hochstetler Arsenio Loader, MD [facility]  If 7PM-7AM, please contact night-coverage  10/25/2022, 8:21 AM

## 2022-10-25 NOTE — Progress Notes (Signed)
Michelle [name] is really about the same.  She does have little more confusion.  I suspect this probably is from the [place] Contin.  I will have to hold off on the [place] Contin.  I do not think she is having any problems with pain right now.  Her renal function is improving slowly but surely.  Her BUN is 52 creatinine is 6.0.  Her albumin is 2.5.  Her calcium is 9.4.  Her hemoglobin is only 7.7.  We are going to have to transfuse her.  She still does not agree to surgery to repair her pathologic fractures.  I really do appreciate Dr. Marlou Sa trying to help [place] out.  We will let him know if and when she agrees to any surgery.  She understands the risk of not having surgery and the fact that fractures could worsen.  She needs to have her chemotherapy.  She is due for chemotherapy tomorrow.  Her blood counts are okay for chemotherapy.  I think this is helping her kidneys.  Hopefully, we will be able to do the chemotherapy tomorrow.  She will be getting a blood transfusion today.  Is hard to say how much she is eating.  Again there is little bit of disorientation.  She has a hard time remembering things.  She does not have diarrhea.  There is no nausea or vomiting.  Her vital signs show temperature 98.1.  Pulse 110.  Blood pressure 122/86.  Oxygen saturation is 99%.  Head and neck exam shows no oral lesions.  She has no adenopathy in the neck.  Lungs are clear bilaterally.  She has good air movement bilaterally.  Cardiac exam is tachycardic but regular.  Abdomen is soft.  Bowel sounds are present.  There is no guarding or rebound tenderness.  Extremity shows the left arm in a soft cast and sling.  She has maybe a slight edema in the legs.  Neurological exam shows some slight confusion.  Michelle [name] has a very aggressive myeloma.  A lot of the problems with what the light chains.  Again, she needs to have another cycle of chemotherapy today or tomorrow.  She will have a blood transfusion today.  I really think this will  help her.  Ultimately, she needs to have surgery to try to repair these fractures.  I do appreciate the incredible care that she is getting from everybody up on 6 E.  Everybody is doing a wonderful job with her.  Michelle Haw, MD  Oswaldo Milian 7:14

## 2022-10-25 NOTE — Progress Notes (Signed)
Patient ID: MANHATTAN MCCUEN, female   DOB: [birth year], 61 y.o.   MRN: [id number]  S:Seen in her room, no events overnight.  No complaints this morning.  O:BP 122/86 (BP Location: Right Arm)   Pulse (!) 110   Temp 98.1 F (36.7 C) (Oral)   Resp 16   Ht 5' 4.25" (1.632 m)   Wt 80.1 kg   SpO2 99%   BMI 30.08 kg/m   Intake/Output Summary (Last 24 hours) at 10/25/2022 1134 Last data filed at 10/25/2022 0435 Gross per 24 hour  Intake 860 ml  Output 400 ml  Net 460 ml   Intake/Output: I/O last 3 completed shifts: In: 980 [P.O.:480; I.V.:450; IV Piggyback:50] Out: 1350 [Urine:1350]  Intake/Output this shift:  No intake/output data recorded. Weight change: 0 kg Gen: NAD CVS: tachy at 110 Resp: CTA Abd: +BS, soft, NT/ND Ext: trace presacral edema  Recent Labs  Lab 10/19/22 0556 10/20/22 0500 10/21/22 0529 10/22/22 0554 10/23/22 0600 10/24/22 0500 10/25/22 0500  NA 139 138 139 141 144 143 146*  K 5.0 4.9 4.9 4.6 4.7 4.6 4.5  CL 108 108 109 113* 116* 116* 119*  CO2 20* 19* 19* 19* 20* 18* 18*  GLUCOSE 147* 128* 82 88 84 80 75  BUN 59* 65* 75* 74* 63* 56* 52*  CREATININE 6.96* 6.79* 6.83* 6.99* 6.47* 6.15* 6.03*  ALBUMIN 2.8* 2.7* 3.0* 2.8* 2.4* 2.2* 2.5*  CALCIUM 9.1 8.9 9.4 9.9 9.5 9.7 9.4  PHOS 5.5* 5.5* 5.3* 5.4*  --   --   --   AST _0 ALT _1 Liver Function Tests: Recent Labs  Lab 10/23/22 0600 10/24/22 0500 10/25/22 0500  AST _2 ALT _3 ALKPHOS 48 49 51  BILITOT 0.6 0.8 0.7  PROT 5.2* 5.2* 5.4*  ALBUMIN 2.4* 2.2* 2.5*   No results for input(s): "LIPASE", "AMYLASE" in the last 168 hours. No results for input(s): "AMMONIA" in the last 168 hours. CBC: Recent Labs  Lab 10/21/22 0529 10/22/22 0554 10/23/22 0600 10/24/22 0500 10/25/22 0500  WBC 6.7 8.3 6.7 7.6 6.9  NEUTROABS  --   --   --  4.9 4.9  HGB 8.9* 8.9* 7.7* 8.3* 7.7*  HCT 27.7* 27.7* 24.2* 26.1* 24.8*  MCV 86.3 86.3 86.7 87.3 89.2  PLT 174  179 160 159 151   Cardiac Enzymes: No results for input(s): "CKTOTAL", "CKMB", "CKMBINDEX", "TROPONINI" in the last 168 hours. CBG: No results for input(s): "GLUCAP" in the last 168 hours.  Iron Studies: No results for input(s): "IRON", "TIBC", "TRANSFERRIN", "FERRITIN" in the last 72 hours. Studies/Results: No results found.  sodium chloride   Intravenous Once   carfilzomib  27 mg/m2 (Treatment Plan Recorded) Intravenous Once   Chlorhexidine Gluconate Cloth  6 each Topical QHS   cyclophosphamide  400 mg Intravenous Once   furosemide  20 mg Intravenous Once   furosemide  20 mg Intravenous Once   heparin injection (subcutaneous)  5,000 Units Subcutaneous Q8H   lactulose  20 g Oral BID   levothyroxine  88 mcg Oral QAC breakfast   megestrol  400 mg Oral BID   metoprolol tartrate  12.5 mg Oral BID   rosuvastatin  10 mg Oral Daily   senna-docusate  2 tablet Oral BID    BMET    Component Value Date/Time   NA 146 (H) 10/25/2022 0500   K 4.5 10/25/2022 0500  CL 119 (H) 10/25/2022 0500   CO2 18 (L) 10/25/2022 0500   GLUCOSE 75 10/25/2022 0500   BUN 52 (H) 10/25/2022 0500   CREATININE 6.03 (H) 10/25/2022 0500   CREATININE 2.22 (H) 10/11/2022 0848   CALCIUM 9.4 10/25/2022 0500   GFRNONAA 7 (L) 10/25/2022 0500   GFRNONAA 24 (L) 10/11/2022 0848   GFRAA 51 (L) 07/12/2015 1942   CBC    Component Value Date/Time   WBC 6.9 10/25/2022 0500   RBC 2.78 (L) 10/25/2022 0500   HGB 7.7 (L) 10/25/2022 0500   HGB 10.7 (L) 10/11/2022 0848   HCT 24.8 (L) 10/25/2022 0500   PLT 151 10/25/2022 0500   PLT 246 10/11/2022 0848   MCV 89.2 10/25/2022 0500   MCH 27.7 10/25/2022 0500   MCHC 31.0 10/25/2022 0500   RDW 16.4 (H) 10/25/2022 0500   LYMPHSABS 0.5 (L) 10/25/2022 0500   MONOABS 1.4 (H) 10/25/2022 0500   EOSABS 0.1 10/25/2022 0500   BASOSABS 0.0 10/25/2022 0500    Assessment/ Plan: AKI on CKD 3b - b/l creat 1.53- 2.26 from sept - nov 2023, eGFR 29- 38 ml/min. Creat here 7.7 on  admission in setting of multiple myeloma w/ high LC's. UA  negative. CT abd noncontrast showed no GU obstruction. Suspect AKI / CKD due to myeloma kidney. IVF"s dc'd, agree, she is eating and euvolemic on exam. Treatment is chemoRx. Per oncology is starting new chemoRx with cytoxan and carfilzomib, given 12/16. Scr improved since admission.  UOP has dropped off since stopping IVF's and poor po intake.  IVF's restarted 10/22/22 with increased UOP and improved BUN/Cr.  She is a poor candidate for long-term dialysis given her MM, multiple co-morbidities, poor functional and nutritional status.  Hopefully her renal function will improve with treatment of her myeloma.  No uremic issues at this time.  Currently off of IVF's.  Avoid nephrotoxic medications including NSAIDs and iodinated intravenous contrast exposure unless the latter is absolutely indicated.   Preferred narcotic agents for pain control are hydromorphone, fentanyl, and methadone. Morphine should not be used.  Avoid Baclofen and avoid oral sodium phosphate and magnesium citrate based laxatives / bowel preps.  Continue strict Input and Output monitoring. Will monitor the patient closely with you and intervene or adjust therapy as indicated by changes in clinical status/labs  L humerus fracture - pathologic due to MM.  Seen by Ortho today and plan for surgical intervention this week. Multiple myeloma - diagnosed June 2023 w/ extensive spinal disease. Recent admit showed malignant R pleural effusion. F/b Dr Marin Olp Hypernatremia - encouraged to increase water intake, however may require hypotonic IVF's if continues to rise. GOC - pt has sig complications from her aggressive cancer/ myeloma and now impending end-stage kidney failure is a real possibility. Have d/w pt briefly. Recommend Palliative care team to be consulted for [place].  Donetta Potts, MD [facility]

## 2022-10-25 NOTE — Progress Notes (Signed)
Patient continues to be hospitalized. She will receive chemo today/tomorrow. Will continue to follow for post discharge needs and office follow up.   Oncology Nurse Navigator Documentation     10/25/2022    9:00 AM  Oncology Nurse Navigator Flowsheets  Navigator Follow Up Date: 11/05/2022  Navigator Follow Up Reason: Appointment Review  Navigator Location [facility]  Navigator Encounter Type Appt/Treatment Plan Review  Patient Visit Type MedOnc  Treatment Phase Active Tx  Barriers/Navigation Needs Coordination of Care;Education  Interventions None Required  Acuity Level 2-Minimal Needs (1-2 Barriers Identified)  Support Groups/Services Friends and Family  Time Spent with Patient 15

## 2022-10-25 NOTE — Progress Notes (Signed)
Physical Therapy Treatment Patient Details Name: Michelle [name] MRN: [id number] DOB: [birth year] Today's Date: 10/25/2022   History of Present Illness Patient is a 61 year old female who initially presented to radiation oncology at the cancer center with complaints of generalized weakness, left arm pain, and vomiting.   In the ED, workup revealed left humerus fracture, AKI and sinus tachycardia.  EDP discussed the case with orthopedic surgery, Dr. Marlou Sa.  Orthopedic surgery recommended outpatient follow-up on Monday 10/18/2022.  No plan for surgical intervention at this time.PMH: CKD 3B, multiple myeloma and malignant pleural effusion status post talc pleurodesis, status post radiation therapy to the skull base. Patient found to have   Patient has bilateral proximal femur metastatic lesions.  The left one is more unstable than the right.  Was made NWB on 12/19 to bilateral LEs.    PT Comments    Patient progressing to OOB this session to wheelchair that had been delivered to the room.  Educated daughters on how to manage wheelchair parts and they participated to scoot her back into the bed.  Patient happy to be able to get OOB and in hallway.  She will need ambulance transport home and encouraged them to talk with social worker about options for medical transport if needed for appointments after d/c.  PT will continue to follow.  Left VM for RNCM about switching out the [facility] for a drop arm BSC and she needs a slide board.     Recommendations for follow up therapy are one component of a multi-disciplinary discharge planning process, led by the attending physician.  Recommendations may be updated based on patient status, additional functional criteria and insurance authorization.  Follow Up Recommendations  Skilled nursing-short term rehab (<3 hours/day) Can patient physically be transported by private vehicle: No   Assistance Recommended at Discharge Frequent or constant Supervision/Assistance  Patient  can return home with the following A lot of help with walking and/or transfers;A lot of help with bathing/dressing/bathroom;Help with stairs or ramp for entrance;Other (comment)   Equipment Recommendations  Wheelchair (measurements PT);Wheelchair cushion (measurements PT);Hospital bed;Other (comment) (hoyer lift, slide board, drop arm BSC)    Recommendations for Other Services       Precautions / Restrictions Precautions Precautions: Fall Precaution Comments: back pain, METS to bilateral femur and lesions in B pelvic bones, made NWB 12/19 Splint/Cast: LUE sling Restrictions LUE Weight Bearing: Non weight bearing RLE Weight Bearing: Non weight bearing LLE Weight Bearing: Non weight bearing     Mobility  Bed Mobility Overal bed mobility: Needs Assistance Bed Mobility: Supine to Sit, Sit to Supine, Rolling Rolling: Mod assist, Max assist   Supine to sit: Mod assist, HOB elevated Sit to supine: Mod assist, +2 for physical assistance   General bed mobility comments: rolling in bed for bed pan to R with rail on R, but difficulty moving hips due to NWB; assist for LE's and to lift trunk with HOB up    Transfers Overall transfer level: Needs assistance   Transfers: Bed to chair/wheelchair/BSC            Lateral/Scoot Transfers: Max assist, +2 safety/equipment General transfer comment: using bed pad to scoot to wheelchair with retractable armrests. daughters in the room and assisting and educated on wheelchair parts.  scooting on EOB with min A    Ambulation/Gait                   Stairs  Information systems manager mobility: Yes Distance: total A in wheelchair to mobilize in Probation officer Details (indicate cue type and reason): Educated on brakes, legrests, armrests, folding to place in car, etc  Modified Rankin (Stroke Patients Only)       Balance Overall balance assessment: Needs assistance    Sitting balance-Leahy Scale: Good Sitting balance - Comments: scooting using UE's to EOB                                    Cognition Arousal/Alertness: Awake/alert Behavior During Therapy: WFL for tasks assessed/performed Overall Cognitive Status: Within Functional Limits for tasks assessed                                          Exercises      General Comments        Pertinent Vitals/Pain Pain Assessment Pain Assessment: Faces Faces Pain Scale: Hurts even more Pain Location: L arm at moments during mobility Pain Descriptors / Indicators: Grimacing, Guarding Pain Intervention(s): Monitored during session, Repositioned, Limited activity within patient's tolerance, Premedicated before session    Home Living                          Prior Function            PT Goals (current goals can now be found in the care plan section) Progress towards PT goals: Progressing toward goals    Frequency    Min 3X/week      PT Plan Current plan remains appropriate    Co-evaluation              AM-PAC PT "6 Clicks" Mobility   Outcome Measure  Help needed turning from your back to your side while in a flat bed without using bedrails?: A Lot Help needed moving from lying on your back to sitting on the side of a flat bed without using bedrails?: Total Help needed moving to and from a bed to a chair (including a wheelchair)?: Total Help needed standing up from a chair using your arms (e.g., wheelchair or bedside chair)?: Total Help needed to walk in hospital room?: Total Help needed climbing 3-5 steps with a railing? : Total 6 Click Score: 7    End of Session   Activity Tolerance: Patient tolerated treatment well Patient left: in bed;with call bell/phone within reach;with family/visitor present   PT Visit Diagnosis: Other abnormalities of gait and mobility (R26.89);Muscle weakness (generalized) (M62.81);Difficulty in walking,  not elsewhere classified (R26.2)     Time: 0511-0211 PT Time Calculation (min) (ACUTE ONLY): 44 min  Charges:  $Therapeutic Activity: 23-37 mins $Self Care/Home Management: 8-22                     Magda Kiel, PT Acute Rehabilitation Services Office:[phone] 10/25/2022    Reginia Naas 10/25/2022, 4:42 PM

## 2022-10-26 DIAGNOSIS — J90 Pleural effusion, not elsewhere classified: Secondary | ICD-10-CM

## 2022-10-26 DIAGNOSIS — N179 Acute kidney failure, unspecified: Secondary | ICD-10-CM | POA: Diagnosis not present

## 2022-10-26 DIAGNOSIS — R000 Tachycardia, unspecified: Secondary | ICD-10-CM | POA: Diagnosis not present

## 2022-10-26 DIAGNOSIS — Z01810 Encounter for preprocedural cardiovascular examination: Secondary | ICD-10-CM | POA: Diagnosis not present

## 2022-10-26 LAB — CBC WITH DIFFERENTIAL/PLATELET
Abs Immature Granulocytes: 0.31 10*3/uL — ABNORMAL HIGH (ref 0.00–0.07)
Basophils Absolute: 0 10*3/uL (ref 0.0–0.1)
Basophils Relative: 0 %
Eosinophils Absolute: 0 10*3/uL (ref 0.0–0.5)
Eosinophils Relative: 0 %
HCT: 26 % — ABNORMAL LOW (ref 36.0–46.0)
Hemoglobin: 8.3 g/dL — ABNORMAL LOW (ref 12.0–15.0)
Immature Granulocytes: 3 %
Lymphocytes Relative: 5 %
Lymphs Abs: 0.6 10*3/uL — ABNORMAL LOW (ref 0.7–4.0)
MCH: 28.1 pg (ref 26.0–34.0)
MCHC: 31.9 g/dL (ref 30.0–36.0)
MCV: 88.1 fL (ref 80.0–100.0)
Monocytes Absolute: 2.3 10*3/uL — ABNORMAL HIGH (ref 0.1–1.0)
Monocytes Relative: 20 %
Neutro Abs: 8.1 10*3/uL — ABNORMAL HIGH (ref 1.7–7.7)
Neutrophils Relative %: 72 %
Platelets: 154 10*3/uL (ref 150–400)
RBC: 2.95 MIL/uL — ABNORMAL LOW (ref 3.87–5.11)
RDW: 16.9 % — ABNORMAL HIGH (ref 11.5–15.5)
WBC: 11.3 10*3/uL — ABNORMAL HIGH (ref 4.0–10.5)
nRBC: 2.1 % — ABNORMAL HIGH (ref 0.0–0.2)

## 2022-10-26 LAB — COMPREHENSIVE METABOLIC PANEL
ALT: 13 U/L (ref 0–44)
AST: 24 U/L (ref 15–41)
Albumin: 2.6 g/dL — ABNORMAL LOW (ref 3.5–5.0)
Alkaline Phosphatase: 58 U/L (ref 38–126)
Anion gap: 9 (ref 5–15)
BUN: 54 mg/dL — ABNORMAL HIGH (ref 8–23)
CO2: 16 mmol/L — ABNORMAL LOW (ref 22–32)
Calcium: 9.4 mg/dL (ref 8.9–10.3)
Chloride: 118 mmol/L — ABNORMAL HIGH (ref 98–111)
Creatinine, Ser: 5.68 mg/dL — ABNORMAL HIGH (ref 0.44–1.00)
GFR, Estimated: 8 mL/min — ABNORMAL LOW (ref >60)
Glucose, Bld: 134 mg/dL — ABNORMAL HIGH (ref 70–99)
Potassium: 4.5 mmol/L (ref 3.5–5.1)
Sodium: 143 mmol/L (ref 135–145)
Total Bilirubin: 0.5 mg/dL (ref 0.3–1.2)
Total Protein: 5.7 g/dL — ABNORMAL LOW (ref 6.5–8.1)

## 2022-10-26 LAB — MAGNESIUM: Magnesium: 1.9 mg/dL (ref 1.7–2.4)

## 2022-10-26 MED ORDER — DEXTROSE-NACL 5-0.45 % IV SOLN: INTRAVENOUS | Status: AC

## 2022-10-26 NOTE — TOC Progression Note (Signed)
Transition of Care [facility]) - Progression Note    Patient Details  Name: Michelle [name] MRN: [id number] Date of Birth: [birth year]  Transition of [facility]) CM/SW [place], LCSW Phone Number: 10/26/2022, 3:23 PM  Clinical Narrative:    CSW spoke with Brenton Grills with [employer], he reported the hospital bed will be delivered after the pt is discharged home.TOC to follow.   Expected Discharge Plan: [place] Barriers to Discharge: Continued Medical Work up  Expected Discharge Plan and Services In-house Referral: NA Discharge Planning Services: CM Consult Post Acute Care Choice: [place] arrangements for the past 2 months: Single Family Home                 DME Arranged: Wheelchair manual DME Agency: [employer] Date DME Agency Contacted: 10/23/22 Time DME Agency Contacted: 0300 Representative spoke with at DME Agency: Melene Muller HH Arranged: PT, OT [place] Agency: [place] Date [place]: 10/23/22 Time [place]: [phone] Representative spoke with at [place]: [place] ([place]) Interventions [place]: No Food Insecurity (10/18/2022)  Housing: Low Risk  (10/18/2022)  Transportation Needs: No Transportation Needs (10/18/2022)  Utilities: Not At Risk (10/18/2022)  Tobacco Use: Medium Risk (10/22/2022)    Readmission Risk Interventions    10/23/2022    1:47 PM 10/03/2022    8:34 AM  Readmission Risk Prevention Plan  Transportation Screening Complete Complete  PCP or Specialist Appt within 3-5 Days  Complete  HRI or [place]  Complete  Social Work Consult for [place] Planning/Counseling  [place]  Not Applicable  Medication Review Press photographer) Complete Complete  PCP or Specialist appointment within 3-5 days of discharge Complete   HRI or [place] Complete   SW Recovery  Care/Counseling Consult Complete   [place] Not Applicable

## 2022-10-26 NOTE — Progress Notes (Signed)
Physical Therapy Treatment Patient Details Name: Michelle [name] MRN: [id number] DOB: [birth year] Today's Date: 10/26/2022   History of Present Illness Patient is a 61 year old female who initially presented to radiation oncology at the cancer center with complaints of generalized weakness, left arm pain, and vomiting.   In the ED, workup revealed left humerus fracture, AKI and sinus tachycardia.  EDP discussed the case with orthopedic surgery, Dr. Marlou Sa.  Orthopedic surgery recommended outpatient follow-up on Monday 10/18/2022.  No plan for surgical intervention at this time.PMH: CKD 3B, multiple myeloma and malignant pleural effusion status post talc pleurodesis, status post radiation therapy to the skull base. Patient found to have   Patient has bilateral proximal femur metastatic lesions.  The left one is more unstable than the right.  Was made NWB on 12/19 to bilateral LEs.    PT Comments    Reviewed rolling, pad placement and removal with pt/dtr; dtr participating in placing pad for maxisky, operating lift controls for OOB--transition to chair with lift; reviewed and educated dtr on body mechanics, ht of hospital bed at home  as well as difference in sky lift/HOyer lift. Pt tolerated OOB to chair very well, rolling is painful. Would benefit from continued discussion with Palliative regarding goals of care and pain management.  Continue to recommend SNF, reviewed importance of the need for 2 caregivers at home given the level of assist pt requires for basic tasks; Pt does not appear to have reasonable insight into her deficits.  pt and dtr state the plan remains for home; DME has been ordered per [facility]   Recommendations for follow up therapy are one component of a multi-disciplinary discharge planning process, led by the attending physician.  Recommendations may be updated based on patient status, additional functional criteria and insurance authorization.  Follow Up Recommendations  Skilled  nursing-short term rehab (<3 hours/day) Can patient physically be transported by private vehicle: No   Assistance Recommended at Discharge Frequent or constant Supervision/Assistance  Patient can return home with the following Two people to help with walking and/or transfers;Two people to help with bathing/dressing/bathroom;Assistance with cooking/housework;Assist for transportation;Direct supervision/assist for medications management;Help with stairs or ramp for entrance   Equipment Recommendations  Wheelchair (measurements PT);Wheelchair cushion (measurements PT);Hospital bed;Other (comment) (hoyer lift, sliding board, drop arm BSC)    Recommendations for Other Services       Precautions / Restrictions Precautions Precautions: Fall Precaution Comments: back pain, METS to bilateral femur and lesions in B pelvic bones, made NWB 12/19 Required Braces or Orthoses: Sling Splint/Cast: LUE sling Restrictions LUE Weight Bearing: Non weight bearing RLE Weight Bearing: Non weight bearing LLE Weight Bearing: Non weight bearing Other Position/Activity Restrictions: 10/25/22 per PT's verbal discussion to clarify [place] status with on call MD (Dr. Lorin Mercy pt should be NWB bil LEs; per PT's text with Dr. Moshe Cipro (consulting ortho MD) answered on 10/26/22-- pt may be WBAT on RLE, NWB LLE--? Will advise family/pt continue NWB bil LEs until this is furher clarified at f/u.     Mobility  Bed Mobility Overal bed mobility: Needs Assistance Bed Mobility: Rolling Rolling: Max assist         General bed mobility comments: rolling in bed to allow dtr to practice assisting and placing pad for sky lift/simulated Hoyer lift transfer (reviewed difference and in sky and hoyer that will be used at home) max assist to roll to R, partial roll to L, pt resistant and very limtied ability to assist    Transfers  Transfer via Systems developer Rankin (Stroke Patients Only)       Balance                                            Cognition Arousal/Alertness: Awake/alert Behavior During Therapy: WFL for tasks assessed/performed Overall Cognitive Status: Impaired/Different from baseline Area of Impairment: Following commands, Problem solving                       Following Commands: Follows one step commands with increased time, Follows multi-step commands inconsistently     Problem Solving: Slow processing, Decreased initiation, Difficulty sequencing, Requires verbal cues, Requires tactile cues General Comments: Able to follow commands. Some difficulty with maintaining attention and understanding her medical complexities and current deficits        Exercises      General Comments        Pertinent Vitals/Pain Pain Assessment Pain Assessment: Faces Faces Pain Scale: Hurts whole lot Pain Location: L UE, back, hips wtih any movement Pain Descriptors / Indicators: Grimacing, Guarding Pain Intervention(s): Monitored during session, Limited activity within patient's tolerance, Premedicated before session, Repositioned    Home Living                          Prior Function            PT Goals (current goals can now be found in the care plan section) Acute Rehab PT Goals Patient Stated Goal: home with daughters and HHPT PT Goal Formulation: With patient Time For Goal Achievement: 11/03/22 Potential to Achieve Goals: Fair Progress towards PT goals: Progressing toward goals    Frequency    Min 2X/week      PT Plan Current plan remains appropriate    Co-evaluation              AM-PAC PT "6 Clicks" Mobility   Outcome Measure  Help needed turning from your back to your side while in a flat bed without using bedrails?: Total Help needed moving from lying on your back to sitting on the side  of a flat bed without using bedrails?: Total Help needed moving to and from a bed to a chair (including a wheelchair)?: Total Help needed standing up from a chair using your arms (e.g., wheelchair or bedside chair)?: Total Help needed to walk in hospital room?: Total Help needed climbing 3-5 steps with a railing? : Total 6 Click Score: 6    End of Session   Activity Tolerance: Patient tolerated treatment well Patient left: in chair;with call bell/phone within reach;with chair alarm set;with family/visitor present   PT Visit Diagnosis: Other abnormalities of gait and mobility (R26.89);Muscle weakness (generalized) (M62.81);Difficulty in walking, not elsewhere classified (R26.2)     Time: 4098-1191 PT Time Calculation (min) (ACUTE ONLY): 35 min  Charges:  $Therapeutic Activity: 23-37 mins                     Baxter Flattery, PT  Acute Rehab Dept [facility]) [phone]  [facility] Weekend Pager [facility] only)  [phone]  10/26/2022    [facility] 10/26/2022,  1:40 PM

## 2022-10-26 NOTE — Progress Notes (Signed)
Patient ID: Michelle [name], female   DOB: 1961/05/02, 61 y.o.   MRN: [id number] S: Feels much better this morning. O:BP 138/82 (BP Location: Right Arm)   Pulse (!) 118   Temp 98.1 F (36.7 C) (Oral)   Resp 17   Ht 5' 4.25" (1.632 m)   Wt 80.1 kg   SpO2 99%   BMI 30.08 kg/m   Intake/Output Summary (Last 24 hours) at 10/26/2022 1133 Last data filed at 10/26/2022 0514 Gross per 24 hour  Intake 659.58 ml  Output 200 ml  Net 459.58 ml   Intake/Output: I/O last 3 completed shifts: In: 1279.6 [P.O.:120; I.V.:737.5; IV Piggyback:422.1] Out: 200 [Urine:200]  Intake/Output this shift:  No intake/output data recorded. Weight change:  Gen: NAD CVS: tachy at 118 Resp:CTA Abd: +BS, soft, NT/nD Ext: trace presacral edema  Recent Labs  Lab 10/20/22 0500 10/21/22 0529 10/22/22 0554 10/23/22 0600 10/24/22 0500 10/25/22 0500 10/26/22 0500  NA 138 139 141 144 143 146* 143  K 4.9 4.9 4.6 4.7 4.6 4.5 4.5  CL 108 109 113* 116* 116* 119* 118*  CO2 19* 19* 19* 20* 18* 18* 16*  GLUCOSE 128* 82 88 84 80 75 134*  BUN 65* 75* 74* 63* 56* 52* 54*  CREATININE 6.79* 6.83* 6.99* 6.47* 6.15* 6.03* 5.68*  ALBUMIN 2.7* 3.0* 2.8* 2.4* 2.2* 2.5* 2.6*  CALCIUM 8.9 9.4 9.9 9.5 9.7 9.4 9.4  PHOS 5.5* 5.3* 5.4*  --   --   --   --   AST _0 ALT _1 Liver Function Tests: Recent Labs  Lab 10/24/22 0500 10/25/22 0500 10/26/22 0500  AST _2 ALT _3 ALKPHOS 49 51 58  BILITOT 0.8 0.7 0.5  PROT 5.2* 5.4* 5.7*  ALBUMIN 2.2* 2.5* 2.6*   No results for input(s): "LIPASE", "AMYLASE" in the last 168 hours. No results for input(s): "AMMONIA" in the last 168 hours. CBC: Recent Labs  Lab 10/22/22 0554 10/23/22 0600 10/24/22 0500 10/25/22 0500 10/26/22 0500  WBC 8.3 6.7 7.6 6.9 11.3*  NEUTROABS  --   --  4.9 4.9 8.1*  HGB 8.9* 7.7* 8.3* 7.7* 8.3*  HCT 27.7* 24.2* 26.1* 24.8* 26.0*  MCV 86.3 86.7 87.3 89.2 88.1  PLT 179 160 159 151 154   Cardiac  Enzymes: No results for input(s): "CKTOTAL", "CKMB", "CKMBINDEX", "TROPONINI" in the last 168 hours. CBG: No results for input(s): "GLUCAP" in the last 168 hours.  Iron Studies: No results for input(s): "IRON", "TIBC", "TRANSFERRIN", "FERRITIN" in the last 72 hours. Studies/Results: No results found.  sodium chloride   Intravenous Once   Chlorhexidine Gluconate Cloth  6 each Topical QHS   furosemide  20 mg Intravenous Once   furosemide  20 mg Intravenous Once   heparin injection (subcutaneous)  5,000 Units Subcutaneous Q8H   lactulose  20 g Oral BID   levothyroxine  88 mcg Oral QAC breakfast   megestrol  400 mg Oral BID   metoprolol tartrate  12.5 mg Oral BID   rosuvastatin  10 mg Oral Daily   senna-docusate  2 tablet Oral BID    BMET    Component Value Date/Time   NA 143 10/26/2022 0500   K 4.5 10/26/2022 0500   CL 118 (H) 10/26/2022 0500   CO2 16 (L) 10/26/2022 0500   GLUCOSE 134 (H) 10/26/2022 0500   BUN 54 (H) 10/26/2022 0500  CREATININE 5.68 (H) 10/26/2022 0500   CREATININE 2.22 (H) 10/11/2022 0848   CALCIUM 9.4 10/26/2022 0500   GFRNONAA 8 (L) 10/26/2022 0500   GFRNONAA 24 (L) 10/11/2022 0848   GFRAA 51 (L) 07/12/2015 1942   CBC    Component Value Date/Time   WBC 11.3 (H) 10/26/2022 0500   RBC 2.95 (L) 10/26/2022 0500   HGB 8.3 (L) 10/26/2022 0500   HGB 10.7 (L) 10/11/2022 0848   HCT 26.0 (L) 10/26/2022 0500   PLT 154 10/26/2022 0500   PLT 246 10/11/2022 0848   MCV 88.1 10/26/2022 0500   MCH 28.1 10/26/2022 0500   MCHC 31.9 10/26/2022 0500   RDW 16.9 (H) 10/26/2022 0500   LYMPHSABS 0.6 (L) 10/26/2022 0500   MONOABS 2.3 (H) 10/26/2022 0500   EOSABS 0.0 10/26/2022 0500   BASOSABS 0.0 10/26/2022 0500    Assessment/ Plan: AKI on CKD 3b - b/l creat 1.53- 2.26 from sept - nov 2023, eGFR 29- 38 ml/min. Creat here 7.7 on admission in setting of multiple myeloma w/ high LC's. UA  negative. CT abd noncontrast showed no GU obstruction. Suspect AKI / CKD due to  myeloma kidney. IVF"s dc'd, agree, she is eating and euvolemic on exam. Treatment is chemoRx. Per oncology is starting new chemoRx with cytoxan and carfilzomib, given 12/16. Scr improved since admission.  UOP has dropped off since stopping IVF's and poor po intake.  IVF's restarted 10/22/22 with increased UOP and improved BUN/Cr.  She is a poor candidate for long-term dialysis given her MM, multiple co-morbidities, poor functional and nutritional status.  Hopefully her renal function will continue to improve with treatment of her myeloma.  No uremic issues at this time.  Currently off of IVF's.  Avoid nephrotoxic medications including NSAIDs and iodinated intravenous contrast exposure unless the latter is absolutely indicated.   Preferred narcotic agents for pain control are hydromorphone, fentanyl, and methadone. Morphine should not be used.  Avoid Baclofen and avoid oral sodium phosphate and magnesium citrate based laxatives / bowel preps.  Continue strict Input and Output monitoring. Will monitor the patient closely with you and intervene or adjust therapy as indicated by changes in clinical status/labs  L humerus fracture - pathologic due to MM.  Seen by Ortho today and plan for surgical intervention this week. Multiple myeloma - diagnosed June 2023 w/ extensive spinal disease. Recent admit showed malignant R pleural effusion. F/b Dr Marin Olp Hypernatremia - encouraged to increase water intake, however may require hypotonic IVF's if continues to rise. Anemia of malignancy - transfuse prn GOC - pt has sig complications from her aggressive cancer/ myeloma and now impending end-stage kidney failure is a real possibility. Have d/w pt briefly. Recommend Palliative care team to be consulted for [place]. Debilitation - PT recommending SNF rehab.  She is non-weightbearing due to pathologic fractures.  Donetta Potts, MD [facility]

## 2022-10-26 NOTE — Progress Notes (Signed)
PROGRESS NOTE    Michelle [name]  IOE:[id number] DOB: 03/15/1961 DOA: 10/17/2022 PCP: Leeroy Cha, MD   Brief Narrative:  61 y.o. female with medical history significant for CKD 3B, multiple myeloma and malignant pleural effusion status post talc pleurodesis, status post radiation therapy to the skull base, treated at cancer center, who initially presented to radiation oncology at the cancer center with complaints of generalized weakness, left arm pain, and vomiting. . X-rays were done. The patient was informed she had a humerus fracture and was advised to go to the ED for further management.  In the ED, workup revealed left humerus fracture, AKI and sinus tachycardia.  EDP discussed the case with orthopedic surgery, Dr. Marlou Sa.  Orthopedic surgery recommended outpatient follow-up on Monday 10/18/2022. The patient prefers to be admitted at [facility] rather than [facility].  Admitted by [facility], hospitalist service.  Oncology consulted and on board. Nephrology on board for AKI. Dr Marlou Sa with orthopedics reconsulted to see if she needs surgical repair of the left humerus fracture. PT has recommended SNF but despite of extensive conversation with the patient and family, she is adamant to go home with home health instead of rehab facility.   Assessment & Plan:  Principal Problem:   AKI (acute kidney injury) ([place]) Active Problems:   Palliative care by specialist   Goals of care, counseling/discussion       AKI on CKD stage IIIb In the setting of multiple myeloma.  CT scan does not show any evidence of obstruction or hydronephrosis.  Baseline creatinine is around 2.2 but during admission creatinine peaked at 7.7.  Nephrology team was consulted.  Avoid nephrotoxic drugs at this point. Creatinine improved, 5.68.  Continue gentle hydration Continue gentle hydration   Multiple Myeloma:  She has light chain myeloma.  Diagnosed in June 2023 with extensive spinal disease.  Status postchemotherapy  per oncology yesterday.  Planning on clearance of PRBC transfusion today   Malignant right pleural effusion:  CXR shows  Right pleural effusion has progressed. Pleural based density right upper lobe appears slightly larger. She is on RA and does not appear to be symptomatic from the pleural effusion.  Appears comfortable.    Let humerus fracture:  - continue with splint.  Seen by [name], Dr Marlou Sa.  Surgery offered but patient is very hesitant at this point.  There is also lytic lesion in her hip which is at risk of pathologic fracture.   Pain control.  Bowel regimen.    Sinus tachycardia:  continue with lopressor 12.5 mg BID.  IV as needed     Hypothyroidism; Resume synthroid 88 mcg daily.     Anemia of chronic disease: Hemoglobin around 8. Plan to units of PRBC transfusion today     Estimated body mass index is 29.59 kg/m as calculated from the following:   Height as of this encounter: 5' 4.25" (1.632 m).   Weight as of this encounter: 78.8 kg.   PT/OT = SNF but family wanting to take her to Home. Not sure how they will be able to take care of her. She is a complete NWB   DVT prophylaxis: heparin injection 5,000 Units Start: 10/22/22 2100 Code Status: Full Family Communication: Daughter at bedside  Status is: Inpatient Planned units of PRBC transfusion today.  Still awaiting hospital bed to be delivered at home.  Hopefully home in next 24-48 hours    Subjective: No complaints doing well  Examination: Constitutional: Not in acute distress Respiratory: Clear to auscultation  bilaterally Cardiovascular: Normal sinus rhythm, no rubs Abdomen: Nontender nondistended good bowel sounds Musculoskeletal: No edema noted Skin: No rashes seen Neurologic: CN 2-12 grossly intact.  And nonfocal Psychiatric: Normal judgment and insight. Alert and oriented x 3. Normal mood.  Lft arm soft cast and sling Left chest port in place.   Objective: Vitals:   10/25/22 2248 10/26/22 0005  10/26/22 0444 10/26/22 0842  BP: 124/79 132/81 (!) 144/90 138/82  Pulse: (!) 116 (!) 110 (!) 117 (!) 118  Resp: _0 Temp: 97.9 F (36.6 C) (!) 97.5 F (36.4 C) 98.2 F (36.8 C) 98.1 F (36.7 C)  TempSrc: Oral Oral Oral Oral  SpO2: 98% 100% 100% 99%  Weight:      Height:        Intake/Output Summary (Last 24 hours) at 10/26/2022 1131 Last data filed at 10/26/2022 0514 Gross per 24 hour  Intake 659.58 ml  Output 200 ml  Net 459.58 ml   Filed Weights   10/22/22 0559 10/24/22 0553 10/25/22 0625  Weight: 78.8 kg 80.1 kg 80.1 kg     Data Reviewed:   CBC: Recent Labs  Lab 10/22/22 0554 10/23/22 0600 10/24/22 0500 10/25/22 0500 10/26/22 0500  WBC 8.3 6.7 7.6 6.9 11.3*  NEUTROABS  --   --  4.9 4.9 8.1*  HGB 8.9* 7.7* 8.3* 7.7* 8.3*  HCT 27.7* 24.2* 26.1* 24.8* 26.0*  MCV 86.3 86.7 87.3 89.2 88.1  PLT 179 160 159 151 073   Basic Metabolic Panel: Recent Labs  Lab 10/20/22 0500 10/21/22 0529 10/22/22 0554 10/23/22 0600 10/24/22 0500 10/25/22 0500 10/26/22 0500  NA 138 139 141 144 143 146* 143  K 4.9 4.9 4.6 4.7 4.6 4.5 4.5  CL 108 109 113* 116* 116* 119* 118*  CO2 19* 19* 19* 20* 18* 18* 16*  GLUCOSE 128* 82 88 84 80 75 134*  BUN 65* 75* 74* 63* 56* 52* 54*  CREATININE 6.79* 6.83* 6.99* 6.47* 6.15* 6.03* 5.68*  CALCIUM 8.9 9.4 9.9 9.5 9.7 9.4 9.4  MG 2.6* 2.4 2.4  --  1.9 1.9 1.9  PHOS 5.5* 5.3* 5.4*  --   --   --   --    GFR: Estimated Creatinine Clearance: 10.4 mL/min (A) (by C-G formula based on SCr of 5.68 mg/dL (H)). Liver Function Tests: Recent Labs  Lab 10/22/22 0554 10/23/22 0600 10/24/22 0500 10/25/22 0500 10/26/22 0500  AST _1 ALT _2 ALKPHOS 57 48 49 51 58  BILITOT 0.4 0.6 0.8 0.7 0.5  PROT 5.8* 5.2* 5.2* 5.4* 5.7*  ALBUMIN 2.8* 2.4* 2.2* 2.5* 2.6*   No results for input(s): "LIPASE", "AMYLASE" in the last 168 hours. No results for input(s): "AMMONIA" in the last 168 hours. Coagulation Profile: No  results for input(s): "INR", "PROTIME" in the last 168 hours. Cardiac Enzymes: No results for input(s): "CKTOTAL", "CKMB", "CKMBINDEX", "TROPONINI" in the last 168 hours. BNP (last 3 results) No results for input(s): "PROBNP" in the last 8760 hours. HbA1C: No results for input(s): "HGBA1C" in the last 72 hours. CBG: No results for input(s): "GLUCAP" in the last 168 hours. Lipid Profile: No results for input(s): "CHOL", "HDL", "LDLCALC", "TRIG", "CHOLHDL", "LDLDIRECT" in the last 72 hours. Thyroid Function Tests: No results for input(s): "TSH", "T4TOTAL", "FREET4", "T3FREE", "THYROIDAB" in the last 72 hours. Anemia Panel: No results for input(s): "VITAMINB12", "FOLATE", "FERRITIN", "TIBC", "IRON", "RETICCTPCT" in the last 72 hours. Sepsis Labs: No results  for input(s): "PROCALCITON", "LATICACIDVEN" in the last 168 hours.  No results found for this or any previous visit (from the past 240 hour(s)).       Radiology Studies: No results found.      Scheduled Meds:  sodium chloride   Intravenous Once   Chlorhexidine Gluconate Cloth  6 each Topical QHS   furosemide  20 mg Intravenous Once   furosemide  20 mg Intravenous Once   heparin injection (subcutaneous)  5,000 Units Subcutaneous Q8H   lactulose  20 g Oral BID   levothyroxine  88 mcg Oral QAC breakfast   megestrol  400 mg Oral BID   metoprolol tartrate  12.5 mg Oral BID   rosuvastatin  10 mg Oral Daily   senna-docusate  2 tablet Oral BID   Continuous Infusions:  dextrose 5 % and 0.45% NaCl     ondansetron 8 mg (10/26/22 0533)     LOS: 9 days   Time spent= 35 mins    Kortny Lirette Arsenio Loader, MD [facility]  If 7PM-7AM, please contact night-coverage  10/26/2022, 11:31 AM

## 2022-10-26 NOTE — Consult Note (Signed)
CARDIOLOGY CONSULT NOTE       Patient ID: Michelle [name] MRN: [id number] DOB/AGE: 01/30/1961 61 y.o.  Admit date: 10/17/2022 Referring Physician: Reesa Chew Primary Physician: Leeroy Cha, MD Primary Cardiologist: Johney Frame Reason for Consultation: tachycardia/preoperative/effusion  Principal Problem:   AKI (acute kidney injury) [facility]) Active Problems:   Palliative care by specialist   Goals of care, counseling/discussion   HPI:  61 y.o. admitted with pathologic humeral fracture acute/chronic renal failure malignant right pleural effusion in setting of aggressive myeloma She has left humeral fracture Dr Marlou Sa consulted no surgery planned inpatient. Dyspnea improved no chest pain Cleared by Dr Johney Frame for orthopedic surgery 10/22/22 61 y.o. Taking PO And pain control is good  TTE August 13/2023 EF 65-70% no significant valve dx   ROS All other systems reviewed and negative except as noted above  Past Medical History:  Diagnosis Date   Anemia of chronic renal failure, stage 3b ([place]) 08/02/2022   Cancer ([place])    Essential hypertension 02/16/2017   Hyperlipemia    Hyperlipidemia 02/16/2017   Hypertension    Hypothyroidism 02/16/2017   Morbid obesity ([place]) 02/16/2017   Multiple myeloma without remission ([place]) 06/20/2022    Family History  Problem Relation Age of Onset   Diabetes Paternal Grandfather    Stroke Paternal Grandfather    Heart attack Paternal Grandfather    Diabetes Father    Hypertension Father    Stroke Father    Lung cancer Father    Hypertension Mother    Kidney disease Mother    Hypertension Sister    Stroke Paternal Grandmother    Heart attack Maternal Grandmother     Social History   Socioeconomic History   Marital status: Widowed    Spouse name: Not on file   Number of children: Not on file   Years of education: Not on file   Highest education level: Not on file  Occupational History   Not on file  Tobacco Use   Smoking status: Former     Packs/day: 0.25    Years: 30.00    Total pack years: 7.50    Types: Cigarettes    Quit date: 34    Years since quitting: 33.9   Smokeless tobacco: Never  Vaping Use   Vaping Use: Never used  Substance and Sexual Activity   Alcohol use: No   Drug use: No   Sexual activity: Not Currently    Birth control/protection: Post-menopausal  Other Topics Concern   Not on file  Social History Narrative   Not on file   Social Determinants of Health   Financial Resource Strain: Not on file  Food Insecurity: No Food Insecurity (10/18/2022)   Hunger Vital Sign    Worried About Running Out of Food in the Last Year: Never true    Ran Out of Food in the Last Year: Never true  Transportation Needs: No Transportation Needs (10/18/2022)   PRAPARE - Hydrologist (Medical): No    Lack of Transportation (Non-Medical): No  Physical Activity: Not on file  Stress: Not on file  Social Connections: Not on file  Intimate Partner Violence: Not At Risk (10/18/2022)   Humiliation, Afraid, Rape, and Kick questionnaire    Fear of Current or Ex-Partner: No    Emotionally Abused: No    Physically Abused: No    Sexually Abused: No    Past Surgical History:  Procedure Laterality Date   IR IMAGING GUIDED PORT INSERTION  06/17/2022   IR  THORACENTESIS ASP PLEURAL SPACE W/IMG GUIDE  06/17/2022   THYROIDECTOMY        Current Facility-Administered Medications:    0.9 %  sodium chloride infusion (Manually program via Guardrails IV Fluids), , Intravenous, Once, Ennever, Rudell Cobb, MD   acetaminophen (TYLENOL) tablet 650 mg, 650 mg, Oral, Q6H PRN, Kayleen Memos, DO, 650 mg at 10/25/22 1538   Chlorhexidine Gluconate Cloth 2 % PADS 6 each, 6 each, Topical, QHS, Hosie Poisson, MD, 6 each at 10/25/22 2132   dextrose 5 %-0.45 % sodium chloride infusion, , Intravenous, Continuous, Amin, Ankit Chirag, MD   furosemide (LASIX) injection 20 mg, 20 mg, Intravenous, Once, Ennever, Rudell Cobb, MD    furosemide (LASIX) injection 20 mg, 20 mg, Intravenous, Once, Ennever, Rudell Cobb, MD   guaiFENesin (ROBITUSSIN) 100 MG/5ML liquid 5 mL, 5 mL, Oral, Q4H PRN, Amin, Ankit Chirag, MD   heparin injection 5,000 Units, 5,000 Units, Subcutaneous, Q8H, Magnant, Charles L, PA-C, 5,000 Units at 10/26/22 7544   hydrALAZINE (APRESOLINE) injection 10 mg, 10 mg, Intravenous, Q4H PRN, Amin, Ankit Chirag, MD   influenza vac split quadrivalent PF (FLUARIX) injection 0.5 mL, 0.5 mL, Intramuscular, Prior to discharge, Hosie Poisson, MD   ipratropium-albuterol (DUONEB) 0.5-2.5 (3) MG/3ML nebulizer solution 3 mL, 3 mL, Nebulization, Q4H PRN, Amin, Ankit Chirag, MD   lactulose ([place]) 10 GM/15ML solution 20 g, 20 g, Oral, BID, Ennever, Rudell Cobb, MD, 20 g at 10/25/22 0854   levothyroxine (SYNTHROID) tablet 88 mcg, 88 mcg, Oral, QAC breakfast, Kayleen Memos, DO, 88 mcg at 10/26/22 0520   lip balm (CARMEX) ointment 1 Application, 1 Application, Topical, PRN, Hosie Poisson, MD, 1 Application at 92/01/00 2236   megestrol (MEGACE) 400 MG/10ML suspension 400 mg, 400 mg, Oral, BID, Volanda Napoleon, MD, 400 mg at 10/25/22 2127   melatonin tablet 3 mg, 3 mg, Oral, QHS PRN, [place] Crane, Carole N, DO   metoprolol tartrate (LOPRESSOR) injection 5 mg, 5 mg, Intravenous, Q4H PRN, Amin, Ankit Chirag, MD   metoprolol tartrate (LOPRESSOR) tablet 12.5 mg, 12.5 mg, Oral, BID, Hall, Carole N, DO, 12.5 mg at 10/25/22 2126   ondansetron (ZOFRAN) 8 mg/NS 50 ml IVPB, 8 mg, Intravenous, Q8H, Hosie Poisson, MD, Last Rate: 216 mL/hr at 10/26/22 0533, 8 mg at 10/26/22 0533   pneumococcal 23 valent vaccine (PNEUMOVAX-23) injection 0.5 mL, 0.5 mL, Intramuscular, Prior to discharge, Hosie Poisson, MD   prochlorperazine (COMPAZINE) injection 5 mg, 5 mg, Intravenous, Q6H PRN, Irene Pap N, DO, 5 mg at 10/18/22 0915   rosuvastatin (CRESTOR) tablet 10 mg, 10 mg, Oral, Daily, Pemberton, Heather E, MD, 10 mg at 10/25/22 0854   senna-docusate (Senokot-S) tablet  1 tablet, 1 tablet, Oral, QHS PRN, Amin, Jeanella Flattery, MD   senna-docusate (Senokot-S) tablet 2 tablet, 2 tablet, Oral, BID, Hosie Poisson, MD, 2 tablet at 10/25/22 2126   traZODone (DESYREL) tablet 50 mg, 50 mg, Oral, QHS PRN, Amin, Ankit Chirag, MD  sodium chloride   Intravenous Once   Chlorhexidine Gluconate Cloth  6 each Topical QHS   furosemide  20 mg Intravenous Once   furosemide  20 mg Intravenous Once   heparin injection (subcutaneous)  5,000 Units Subcutaneous Q8H   lactulose  20 g Oral BID   levothyroxine  88 mcg Oral QAC breakfast   megestrol  400 mg Oral BID   metoprolol tartrate  12.5 mg Oral BID   rosuvastatin  10 mg Oral Daily   senna-docusate  2 tablet Oral BID  dextrose 5 % and 0.45% NaCl     ondansetron 8 mg (10/26/22 0533)    Physical Exam: Blood pressure (!) 144/90, pulse (!) 117, temperature 98.2 F (36.8 C), temperature source Oral, resp. rate 18, height 5' 4.25" (1.632 m), weight 80.1 kg, SpO2 100 %.   Chronically ill black female Left humeral fracture Decreased BS right base No murmur  Abdomen benign No edema   Labs:   Lab Results  Component Value Date   WBC 11.3 (H) 10/26/2022   HGB 8.3 (L) 10/26/2022   HCT 26.0 (L) 10/26/2022   MCV 88.1 10/26/2022   PLT 154 10/26/2022    Recent Labs  Lab 10/26/22 0500  NA 143  K 4.5  CL 118*  CO2 16*  BUN 54*  CREATININE 5.68*  CALCIUM 9.4  PROT 5.7*  BILITOT 0.5  ALKPHOS 58  ALT 13  AST 24  GLUCOSE 134*   No results found for: "CKTOTAL", "CKMB", "CKMBINDEX", "TROPONINI"  Lab Results  Component Value Date   CHOL 235 (H) 10/15/2010   CHOL 251 (H) 08/15/2010   CHOL 261 (H) 11/08/2009   Lab Results  Component Value Date   HDL 56 10/15/2010   HDL 62 08/15/2010   HDL 62 11/08/2009   Lab Results  Component Value Date   LDLCALC 158 (H) 10/15/2010   LDLCALC 155 (H) 08/15/2010   LDLCALC 178 (H) 11/08/2009   Lab Results  Component Value Date   TRIG 106 10/15/2010   TRIG 170 (H)  08/15/2010   TRIG 105 11/08/2009   Lab Results  Component Value Date   CHOLHDL 4.2 Ratio 10/15/2010   CHOLHDL 4.0 Ratio 08/15/2010   CHOLHDL 4.2 Ratio 11/08/2009   No results found for: "LDLDIRECT"    Radiology: DG HIP UNILAT WITH PELVIS 2-3 VIEWS LEFT  Result Date: 10/22/2022 CLINICAL DATA:  Left groin pain.  History of multiple myeloma. EXAM: DG HIP (WITH OR WITHOUT PELVIS) 2-3V LEFT COMPARISON:  CT abdomen 10/19/2022, CT abdomen pelvis 07/18/2022 FINDINGS: Compared to 07/18/2022 CT, there is a new lytic lesion with destruction of the left lesser trochanter. The tip of the left lesser trochanter is superiorly displaced approximately 1.8 cm. This is suspicious for a bone metastasis and pathologic avulsion. Multiple additional bilateral proximal femoral lucencies are again seen consistent with lytic metastases. The largest is seen within the right femoral neck measuring up to approximately 3.8 cm in diameter, similar to 07/18/2022 CT. Multiple lytic lesions are again seen throughout the bilateral superior and inferior pubic rami and bilateral iliac bones, although better visualized on prior CT. On prior 07/18/2022 CT, the largest was with in the left ilium, and there is diffuse lucency within the superior left ilium corresponding to this lesion. IMPRESSION: 1. Compared to 07/18/2022 CT, there is a new left lesser trochanter lytic lesion consistent with a bone metastasis and pathologic displaced avulsion fracture. 2. Multiple additional lytic lesions are again seen throughout the bilateral superior and inferior pubic rami and bilateral iliac bones, as on prior CT and consistent with reported history of multiple myeloma. Electronically Signed   By: Yvonne Kendall M.D.   On: 10/22/2022 11:16   CT Abd Limited W/O Cm  Result Date: 10/19/2022 CLINICAL DATA:  Acute kidney failure. Suboptimal renal ultrasound due to immobility and bowel-gas EXAM: CT ABDOMEN WITHOUT CONTRAST LIMITED TECHNIQUE:  Multidetector CT imaging of the abdomen was performed following the standard protocol without IV contrast. RADIATION DOSE REDUCTION: This exam was performed according to the departmental dose-optimization program  which includes automated exposure control, adjustment of the mA and/or kV according to patient size and/or use of iterative reconstruction technique. COMPARISON:  Ultrasound earlier today and CT abdomen and pelvis 07/18/2022 FINDINGS: Lower chest: Moderate right and small left pleural effusions. Fluid tracks into the right major fissure. Associated atelectasis/infiltrates. Partially visualized CVC. Coronary artery calcification. Hepatobiliary: Respiratory motion obscures portions of the upper abdomen. Unremarkable noncontrast appearance of the liver. No biliary dilation. No cholelithiasis. Pancreas: Unremarkable. No pancreatic ductal dilatation or surrounding inflammatory changes. Spleen: Normal in size without focal abnormality. Adrenals/Urinary Tract: Adrenal glands are unremarkable. Kidneys are normal, without renal calculi, focal lesion, or hydronephrosis. Stomach/Bowel: Normal caliber large and small bowel were visualized. Moderate stool burden in the visualized portions of the ascending and transverse colon. Vascular/Lymphatic: Aortic atherosclerosis. No enlarged abdominal or pelvic lymph nodes. Other: No free intraperitoneal air. Musculoskeletal: Expansile lytic lesion and soft tissue mass in the anterior right sixth rib. The mass measures 6.2 by 8.3 cm and causes mass effect on the right hepatic lobe. Additional partially visualized expansile lytic mass in the left iliac wing. Multiple bilateral subacute/chronic rib fractures and depressed sternal fracture. Predominantly lytic lesions throughout the thoracolumbar spine with multiple vertebral body compression deformities similar to 07/18/2022. IMPRESSION: Unremarkable noncontrast appearance of the kidneys. No hydronephrosis or visualized obstructing  urinary calculi. Progression of diffuse osseous lytic lesions in the axial and appendicular skeleton compatible with multiple myeloma. This includes increased size of the expansile lytic lesion with soft tissue mass in the anterior right sixth rib and left iliac wing. Multiple pathologic vertebral body compression fractures are similar to 07/18/2022. Moderate colonic stool burden. Moderate right and small left pleural effusions. Electronically Signed   By: Placido Sou M.D.   On: 10/19/2022 20:01   US RENAL  Result Date: 10/19/2022 CLINICAL DATA:  AKI. Per technologist note, suboptimal images due to patient breathing an overlying bowel gas. EXAM: RENAL / URINARY TRACT ULTRASOUND COMPLETE COMPARISON:  CT abdomen and pelvis July 18, 2022 FINDINGS: Right Kidney: Renal measurements: 8.6 x 3.9 x 4.8 cm = volume: 84.6 mL. Echogenicity within normal limits. No mass or hydronephrosis visualized. Left Kidney: Renal measurements: 9.9 x 6.1 x 5.0 cm = volume: 158.5 mL. Echogenicity within normal limits. No mass or hydronephrosis visualized. Bladder: Decompressed with Foley catheter in place. Other: None. IMPRESSION: No acute findings. Electronically Signed   By: Beryle Flock M.D.   On: 10/19/2022 17:26   DG Humerus Left  Result Date: 10/17/2022 CLINICAL DATA:  History of multiple myeloma. Right arm pain after injury. EXAM: LEFT HUMERUS - 2+ VIEW COMPARISON:  None Available. FINDINGS: Mildly displaced fracture is seen involving the proximal left humeral shaft with irregular lucency in the area most consistent with pathologic fracture. IMPRESSION: Mildly displaced pathologic fracture is seen involving the midshaft of the left humerus. Electronically Signed   By: Marijo Conception M.D.   On: 10/17/2022 13:25   DG Chest 2 View  Result Date: 10/17/2022 CLINICAL DATA:  Pain over left posterior chest wall. Multiple myeloma. EXAM: CHEST - 2 VIEW COMPARISON:  Chest 10/02/2022 FINDINGS: Heart size normal.   Negative for heart failure or edema. Interval removal of right basilar chest tube since the prior study. No pneumothorax. Right pleural effusion is small but has progressed from the prior study. Pleural based density right upper lobe appears slightly larger and may be pleural fluid or mass lesion. Progressive right lower lobe airspace disease. Myeloma lesions in the ribs on the right are better  seen on prior CT. Port-A-Cath tip in the lower SVC unchanged. IMPRESSION: 1. Interval removal of right basilar chest tube. No pneumothorax. 2. Right pleural effusion has progressed. Pleural based density right upper lobe appears slightly larger. 3. Progressive right lower lobe airspace disease. Electronically Signed   By: Franchot Gallo M.D.   On: 10/17/2022 10:32   DG CHEST PORT 1 VIEW  Result Date: 10/02/2022 CLINICAL DATA:  [id number] with right pleural effusion and chest tube in place. EXAM: PORTABLE CHEST 1 VIEW COMPARISON:  Portable chest yesterday at 5:26 p.m. FINDINGS: 4:41 a.m. Left chest port with IJ approach catheter terminating in the right atrium. Pigtail pleural tube in the lower lateral right chest is again noted with decreased right pleural fluid in the interval, small layering right pleural effusion remains and a possible loculated small collection in the lateral apical right chest. There is elevated right hemidiaphragm and perihilar atelectatic bands. The right base is obscured by the elevated hemidiaphragm. Right basilar airspace disease could be hidden. The remaining visualized lungs are clear. The cardiac size is normal. There is aortic tortuosity and calcification, stable mediastinum. No acute osseous findings. IMPRESSION: 1. Decreased right pleural fluid in the interval with a small layering right pleural effusion remaining. Possible loculated collection in the lateral apical right chest. Right basilar airspace disease could be hidden due to low inspiration and elevated right diaphragm. 2. Elevated  right hemidiaphragm with perihilar atelectatic bands. Electronically Signed   By: Telford Nab M.D.   On: 10/02/2022 06:51   DG CHEST PORT 1 VIEW  Result Date: 10/01/2022 CLINICAL DATA:  Chest 2 EXAM: PORTABLE CHEST 1 VIEW COMPARISON:  10/01/2022, CT 06/19/2022 FINDINGS: Left-sided central venous port tip over the right atrial region. Hypoventilatory changes. Interim placement of right-sided chest tube with pigtail at the right lower lateral chest. Residual right pleural effusion with possible loculation at the peripheral right upper lobe. No visible pneumothorax. Airspace disease at the right lung base. Probable background mild pulmonary edema. IMPRESSION: 1. Interim placement of right-sided chest tube with residual right pleural effusion and possible loculation at the peripheral right upper lobe. No visible pneumothorax. 2. Airspace disease at the right lung base persists. 3. Suspect mild interstitial edema Electronically Signed   By: Donavan Foil M.D.   On: 10/01/2022 18:18   DG Chest 2 View  Result Date: 10/01/2022 CLINICAL DATA:  Chest pain EXAM: CHEST - 2 VIEW COMPARISON:  09/17/2022 FINDINGS: Cardiac shadow is stable. Increasing right-sided pleural effusion is noted when compared with the prior exam. Left chest wall port is again seen. No acute bony abnormality is noted. IMPRESSION: Increasing right-sided pleural effusion. Electronically Signed   By: Inez Catalina M.D.   On: 10/01/2022 03:37    EKG: ST rate 111 otherwise noraml    ASSESSMENT AND PLAN:    Preoperative: ok to have any surgery ECG non acute EF normal no history of CAD  Tachcycardia:  related to A/CRF anemia and pain from pathologic fracture  CRF:  in setting of myelomy Cr 5.68 associated with Hct 26 per nephrology  Ortho:  left humerus fx as well as left hip and lytic lesions in right femoral neck will need to be addressed by Dr Marlou Sa and ortho Pleural effusion prior right thoracentesis 09/17/22 repeat CXR pending     Signed: Jenkins Rouge 10/26/2022, 8:15 AM

## 2022-10-26 NOTE — Progress Notes (Signed)
Michelle [name] is looking better this morning.  She is much more alert.  I think stopping the MS Contin probably help with this.  Her renal function continues to improve.  Her BUN is 54 creatinine 5.68.  Her albumin is 2.6.  Calcium is 9.4.  We will have to be careful as the corrected calcium is a little on the higher side.  We cannot transfuse her yesterday because of antibodies developed.  Somehow, this does not surprise me.  She will hopefully, be transfused today.  Her hemoglobin is 8.3.  Her platelet count is 154,000.  She did well with chemotherapy yesterday.  She has had no problems with nausea or vomiting.  She says her pain seems to be under decent control.  She says she is not constipated or having diarrhea.  She has had no cough or shortness of breath.  There is no bleeding.  Her appetite seems to be doing okay.  Her vital signs are stable.  Temperature 98.2.  Pulse 100.  Blood pressure 144/90.  Her lungs are clear bilaterally.  She has good air movement bilaterally.  Cardiac exam is tachycardic but regular.  She has no murmurs.  Abdomen is soft.  She has decent bowel sounds.  There is no fluid wave.  There is no guarding or rebound tenderness.  Extremities shows the left arm in the soft cast and sling.  She has no swelling or tenderness in the legs to palpation.  Neurological exam is nonfocal.  Michelle [name] has aggressive myeloma.  We have her on chemotherapy.  She got Kyprolis/Cytoxan yesterday.  We cannot give her Faspro as an inpatient.  She still is not ready for any surgery for her pathologic fractures.  I am just glad to see that her mental status and cognition are better.  Hopefully, she will have the transfusion today.  As far as discharge, I think that if there is a place for her to go back and help with rehab and getting her functional again to be fine by me.  I am happy that she seems to be doing better.  I know that she is getting incredible care from everybody upon 6  E.   [name], MD  Lurena Joiner 1:30

## 2022-10-27 DIAGNOSIS — N179 Acute kidney failure, unspecified: Secondary | ICD-10-CM | POA: Diagnosis not present

## 2022-10-27 DIAGNOSIS — Z01810 Encounter for preprocedural cardiovascular examination: Secondary | ICD-10-CM | POA: Diagnosis not present

## 2022-10-27 DIAGNOSIS — R000 Tachycardia, unspecified: Secondary | ICD-10-CM | POA: Diagnosis not present

## 2022-10-27 LAB — CBC WITH DIFFERENTIAL/PLATELET
Abs Immature Granulocytes: 0.28 10*3/uL — ABNORMAL HIGH (ref 0.00–0.07)
Basophils Absolute: 0 10*3/uL (ref 0.0–0.1)
Basophils Relative: 0 %
Eosinophils Absolute: 0 10*3/uL (ref 0.0–0.5)
Eosinophils Relative: 0 %
HCT: 26 % — ABNORMAL LOW (ref 36.0–46.0)
Hemoglobin: 8.3 g/dL — ABNORMAL LOW (ref 12.0–15.0)
Immature Granulocytes: 5 %
Lymphocytes Relative: 6 %
Lymphs Abs: 0.4 10*3/uL — ABNORMAL LOW (ref 0.7–4.0)
MCH: 27.9 pg (ref 26.0–34.0)
MCHC: 31.9 g/dL (ref 30.0–36.0)
MCV: 87.2 fL (ref 80.0–100.0)
Monocytes Absolute: 2.3 10*3/uL — ABNORMAL HIGH (ref 0.1–1.0)
Monocytes Relative: 37 %
Neutro Abs: 3.2 10*3/uL (ref 1.7–7.7)
Neutrophils Relative %: 52 %
Platelets: 133 10*3/uL — ABNORMAL LOW (ref 150–400)
RBC: 2.98 MIL/uL — ABNORMAL LOW (ref 3.87–5.11)
RDW: 17.4 % — ABNORMAL HIGH (ref 11.5–15.5)
WBC: 6.1 10*3/uL (ref 4.0–10.5)
nRBC: 6 % — ABNORMAL HIGH (ref 0.0–0.2)

## 2022-10-27 LAB — PREPARE RBC (CROSSMATCH)

## 2022-10-27 LAB — COMPREHENSIVE METABOLIC PANEL
ALT: 12 U/L (ref 0–44)
AST: 21 U/L (ref 15–41)
Albumin: 2.6 g/dL — ABNORMAL LOW (ref 3.5–5.0)
Alkaline Phosphatase: 57 U/L (ref 38–126)
Anion gap: 9 (ref 5–15)
BUN: 53 mg/dL — ABNORMAL HIGH (ref 8–23)
CO2: 16 mmol/L — ABNORMAL LOW (ref 22–32)
Calcium: 9.4 mg/dL (ref 8.9–10.3)
Chloride: 118 mmol/L — ABNORMAL HIGH (ref 98–111)
Creatinine, Ser: 5.48 mg/dL — ABNORMAL HIGH (ref 0.44–1.00)
GFR, Estimated: 8 mL/min — ABNORMAL LOW (ref >60)
Glucose, Bld: 114 mg/dL — ABNORMAL HIGH (ref 70–99)
Potassium: 4.1 mmol/L (ref 3.5–5.1)
Sodium: 143 mmol/L (ref 135–145)
Total Bilirubin: 0.6 mg/dL (ref 0.3–1.2)
Total Protein: 5.8 g/dL — ABNORMAL LOW (ref 6.5–8.1)

## 2022-10-27 LAB — MAGNESIUM: Magnesium: 1.9 mg/dL (ref 1.7–2.4)

## 2022-10-27 MED ORDER — FENTANYL CITRATE PF 50 MCG/ML IJ SOSY
12.5000 ug | PREFILLED_SYRINGE | Freq: Once | INTRAMUSCULAR | Status: AC
  Administered 2022-10-27: 12.5 ug via INTRAVENOUS
  Filled 2022-10-27: qty 1

## 2022-10-27 MED ORDER — OXYCODONE HCL 5 MG PO TABS
5.0000 mg | ORAL_TABLET | ORAL | Status: AC | PRN
  Administered 2022-10-27 – 2022-10-28 (×2): 5 mg via ORAL
  Filled 2022-10-27 (×2): qty 1

## 2022-10-27 NOTE — Progress Notes (Signed)
PROGRESS NOTE    Michelle [name]  WSF:[id number] DOB: Jul 06, 1961 DOA: 10/17/2022 PCP: Leeroy Cha, MD   Brief Narrative:  61 y.o. female with medical history significant for CKD 3B, multiple myeloma and malignant pleural effusion status post talc pleurodesis, status post radiation therapy to the skull base, treated at cancer center, who initially presented to radiation oncology at the cancer center with complaints of generalized weakness, left arm pain, and vomiting. . X-rays were done. The patient was informed she had a humerus fracture and was advised to go to the ED for further management.  In the ED, workup revealed left humerus fracture, AKI and sinus tachycardia.  EDP discussed the case with orthopedic surgery, Dr. Marlou Sa.  Orthopedic surgery recommended outpatient follow-up on Monday 10/18/2022. The patient prefers to be admitted at [facility] rather than [facility].  Admitted by [facility], hospitalist service.  Oncology consulted and on board. Nephrology on board for AKI. Dr Marlou Sa with orthopedics reconsulted to see if she needs surgical repair of the left humerus fracture. PT has recommended SNF but despite of extensive conversation with the patient and family, she is adamant to go home with home health instead of rehab facility.  Currently we are waiting for blood transfusion coming from [place]:  Principal Problem:   AKI (acute kidney injury) [facility]) Active Problems:   Palliative care by specialist   Goals of care, counseling/discussion       AKI on CKD stage IIIb In the setting of multiple myeloma.  CT scan does not show any evidence of obstruction or hydronephrosis.  Baseline creatinine is around 2.2 but during admission creatinine peaked at 7.7.  Nephrology team was consulted.  Avoid nephrotoxic drugs at this point. Creatinine improved, 5.48.  Continue gentle hydration Continue gentle hydration   Multiple Myeloma:  She has light chain myeloma.  Diagnosed in  June 2023 with extensive spinal disease.  Status postchemotherapy per oncology yesterday.  Planning on clearance of PRBC transfusion today   Malignant right pleural effusion:  CXR shows  Right pleural effusion has progressed. Pleural based density right upper lobe appears slightly larger. She is on RA and does not appear to be symptomatic from the pleural effusion.  Appears comfortable.    Let humerus fracture:  - continue with splint.  Seen by [name], Dr Marlou Sa.  Surgery offered but patient is very hesitant at this point.  There is also lytic lesion in her hip which is at risk of pathologic fracture.   Pain control.  Bowel regimen.    Sinus tachycardia:  continue with lopressor 12.5 mg BID.  IV as needed     Hypothyroidism; Resume synthroid 88 mcg daily.     Anemia of chronic disease: Hemoglobin around 8. Plan 2 unit PRBC transfusion per oncology but currently awaiting her blood to be delivered from [place] due to multiple antibodies     Estimated body mass index is 29.59 kg/m as calculated from the following:   Height as of this encounter: 5' 4.25" (1.632 m).   Weight as of this encounter: 78.8 kg.   PT/OT = SNF but family wanting to take her to Home. Not sure how they will be able to take care of her. She is a complete NWB   DVT prophylaxis: heparin injection 5,000 Units Start: 10/22/22 2100 Code Status: Full Family Communication: Daughter at bedside  Status is: Inpatient Hopefully discharge home after transfusion.  Has special antibodies [place]  Subjective: No complaints doing well  Examination: Constitutional: Not in acute distress Respiratory: Clear to auscultation bilaterally Cardiovascular: Normal sinus rhythm, no rubs Abdomen: Nontender nondistended good bowel sounds Musculoskeletal: No edema noted Skin: No rashes seen Neurologic: CN 2-12 grossly intact.  And nonfocal Psychiatric: Normal judgment and insight.  Alert and oriented x 3. Normal mood.  Lft arm soft cast and sling Left chest port in place.   Objective: Vitals:   10/27/22 0026 10/27/22 0400 10/27/22 0806 10/27/22 0933  BP: 137/86 (!) 142/81 (!) 142/85 (!) 142/85  Pulse: (!) 121 (!) 106 (!) 109 (!) 109  Resp: _0 Temp: 98 F (36.7 C)  98.6 F (37 C)   TempSrc: Oral  Oral   SpO2: 100% 100% 100%   Weight:      Height:        Intake/Output Summary (Last 24 hours) at 10/27/2022 1016 Last data filed at 10/27/2022 0317 Gross per 24 hour  Intake 1021.83 ml  Output 900 ml  Net 121.83 ml   Filed Weights   10/22/22 0559 10/24/22 0553 10/25/22 0625  Weight: 78.8 kg 80.1 kg 80.1 kg     Data Reviewed:   CBC: Recent Labs  Lab 10/23/22 0600 10/24/22 0500 10/25/22 0500 10/26/22 0500 10/27/22 0510  WBC 6.7 7.6 6.9 11.3* 6.1  NEUTROABS  --  4.9 4.9 8.1* 3.2  HGB 7.7* 8.3* 7.7* 8.3* 8.3*  HCT 24.2* 26.1* 24.8* 26.0* 26.0*  MCV 86.7 87.3 89.2 88.1 87.2  PLT 160 159 151 154 466*   Basic Metabolic Panel: Recent Labs  Lab 10/21/22 0529 10/22/22 0554 10/23/22 0600 10/24/22 0500 10/25/22 0500 10/26/22 0500 10/27/22 0510  NA 139 141 144 143 146* 143 143  K 4.9 4.6 4.7 4.6 4.5 4.5 4.1  CL 109 113* 116* 116* 119* 118* 118*  CO2 19* 19* 20* 18* 18* 16* 16*  GLUCOSE 82 88 84 80 75 134* 114*  BUN 75* 74* 63* 56* 52* 54* 53*  CREATININE 6.83* 6.99* 6.47* 6.15* 6.03* 5.68* 5.48*  CALCIUM 9.4 9.9 9.5 9.7 9.4 9.4 9.4  MG 2.4 2.4  --  1.9 1.9 1.9 1.9  PHOS 5.3* 5.4*  --   --   --   --   --    GFR: Estimated Creatinine Clearance: 10.8 mL/min (A) (by C-G formula based on SCr of 5.48 mg/dL (H)). Liver Function Tests: Recent Labs  Lab 10/23/22 0600 10/24/22 0500 10/25/22 0500 10/26/22 0500 10/27/22 0510  AST _1 ALT _2 ALKPHOS 48 49 51 58 57  BILITOT 0.6 0.8 0.7 0.5 0.6  PROT 5.2* 5.2* 5.4* 5.7* 5.8*  ALBUMIN 2.4* 2.2* 2.5* 2.6* 2.6*   No results for input(s): "LIPASE", "AMYLASE" in  the last 168 hours. No results for input(s): "AMMONIA" in the last 168 hours. Coagulation Profile: No results for input(s): "INR", "PROTIME" in the last 168 hours. Cardiac Enzymes: No results for input(s): "CKTOTAL", "CKMB", "CKMBINDEX", "TROPONINI" in the last 168 hours. BNP (last 3 results) No results for input(s): "PROBNP" in the last 8760 hours. HbA1C: No results for input(s): "HGBA1C" in the last 72 hours. CBG: No results for input(s): "GLUCAP" in the last 168 hours. Lipid Profile: No results for input(s): "CHOL", "HDL", "LDLCALC", "TRIG", "CHOLHDL", "LDLDIRECT" in the last 72 hours. Thyroid Function Tests: No results for input(s): "TSH", "T4TOTAL", "FREET4", "T3FREE", "THYROIDAB" in the last 72 hours. Anemia Panel: No results for input(s): "VITAMINB12", "FOLATE", "FERRITIN", "TIBC", "  IRON", "RETICCTPCT" in the last 72 hours. Sepsis Labs: No results for input(s): "PROCALCITON", "LATICACIDVEN" in the last 168 hours.  No results found for this or any previous visit (from the past 240 hour(s)).       Radiology Studies: No results found.      Scheduled Meds:  sodium chloride   Intravenous Once   Chlorhexidine Gluconate Cloth  6 each Topical QHS   furosemide  20 mg Intravenous Once   furosemide  20 mg Intravenous Once   heparin injection (subcutaneous)  5,000 Units Subcutaneous Q8H   lactulose  20 g Oral BID   levothyroxine  88 mcg Oral QAC breakfast   megestrol  400 mg Oral BID   metoprolol tartrate  12.5 mg Oral BID   rosuvastatin  10 mg Oral Daily   senna-docusate  2 tablet Oral BID   Continuous Infusions:  ondansetron 8 mg (10/27/22 0548)     LOS: 10 days   Time spent= 35 mins    Sabrena Gavitt Arsenio Loader, MD [facility]  If 7PM-7AM, please contact night-coverage  10/27/2022, 10:16 AM

## 2022-10-27 NOTE — Progress Notes (Signed)
OT Cancellation Note  Patient Details Name: JADELIN ENG MRN: [id number] DOB: Dec 17, 1960   Cancelled Treatment:    Reason Eval/Treat Not Completed: Patient not medically ready Patient pending [place] PRBCs [place] nurse . OT to continue to follow and check back as schedule will allow.  Rennie Plowman, [place] [facility] Office# [phone]  10/27/2022, 2:20 PM

## 2022-10-27 NOTE — Progress Notes (Signed)
    Subjective:  Denies SSCP, palpitations or Dyspnea Taking PO ? For surgery next week   Objective:  Vitals:   10/26/22 1952 10/26/22 2214 10/27/22 0026 10/27/22 0400  BP: (!) 143/89 119/79 137/86 (!) 142/81  Pulse: (!) 116 (!) 109 (!) 121 (!) 106  Resp: '18 16 18 18  '$ Temp: 98.4 F (36.9 C) 98.2 F (36.8 C) 98 F (36.7 C)   TempSrc: Oral Oral Oral   SpO2: 100% 100% 100% 100%  Weight:      Height:        Intake/Output from previous day:  Intake/Output Summary (Last 24 hours) at 10/27/2022 0747 Last data filed at 10/27/2022 0263 Gross per 24 hour  Intake 1021.83 ml  Output 900 ml  Net 121.83 ml    Physical Exam: Elderly black female Decreased BS right base S1/S2 Abdomen benign Left humeral fracture Trace edema  Lab Results: Basic Metabolic Panel: Recent Labs    10/26/22 0500 10/27/22 0510  NA 143 143  K 4.5 4.1  CL 118* 118*  CO2 16* 16*  GLUCOSE 134* 114*  BUN 54* 53*  CREATININE 5.68* 5.48*  CALCIUM 9.4 9.4  MG 1.9 1.9   Liver Function Tests: Recent Labs    10/26/22 0500 10/27/22 0510  AST 24 21  ALT 13 12  ALKPHOS 58 57  BILITOT 0.5 0.6  PROT 5.7* 5.8*  ALBUMIN 2.6* 2.6*   No results for input(s): "LIPASE", "AMYLASE" in the last 72 hours. CBC: Recent Labs    10/26/22 0500 10/27/22 0510  WBC 11.3* 6.1  NEUTROABS 8.1* 3.2  HGB 8.3* 8.3*  HCT 26.0* 26.0*  MCV 88.1 87.2  PLT 154 133*     Imaging: No results found.  Cardiac Studies:  ECG: ST rate 111 normal otherwise    Telemetry:  NSR 10/27/2022   Echo: EF 65-70%   Medications:    sodium chloride   Intravenous Once   Chlorhexidine Gluconate Cloth  6 each Topical QHS   furosemide  20 mg Intravenous Once   furosemide  20 mg Intravenous Once   heparin injection (subcutaneous)  5,000 Units Subcutaneous Q8H   lactulose  20 g Oral BID   levothyroxine  88 mcg Oral QAC breakfast   megestrol  400 mg Oral BID   metoprolol tartrate  12.5 mg Oral BID   rosuvastatin  10 mg Oral  Daily   senna-docusate  2 tablet Oral BID      dextrose 5 % and 0.45% NaCl 50 mL/hr at 10/27/22 0317   ondansetron 8 mg (10/27/22 0548)    Assessment/Plan:    Preoperative: ok to have any surgery ECG non acute EF normal no history of CAD  Tachcycardia:  related to A/CRF anemia and pain from pathologic fracture  CRF:  in setting of myelomy Cr 5.48 associated with Hct 26 per nephrology  Ortho:  left humerus fx as well as left hip and lytic lesions in right femoral neck will need to be addressed by Dr Marlou Sa and ortho Pleural effusion prior right thoracentesis 09/17/22 repeat CXR pending   Biggest concern with any surgery will be azotemia and anemia   Jenkins Rouge 10/27/2022, 7:47 AM

## 2022-10-27 NOTE — Progress Notes (Signed)
Patient ID: Michelle [name], female   DOB: [birth year], 61 y.o.   MRN: [id number] S: No complaints O:BP (!) 142/85   Pulse (!) 109   Temp 98.6 F (37 C) (Oral)   Resp 17   Ht 5' 4.25" (1.632 m)   Wt 80.1 kg   SpO2 100%   BMI 30.08 kg/m   Intake/Output Summary (Last 24 hours) at 10/27/2022 1109 Last data filed at 10/27/2022 0317 Gross per 24 hour  Intake 1021.83 ml  Output 900 ml  Net 121.83 ml   Intake/Output: I/O last 3 completed shifts: In: 1021.8 [I.V.:805.8; IV Piggyback:216] Out: 1100 [Urine:1100]  Intake/Output this shift:  No intake/output data recorded. Weight change:  Gen: NAD CVS: RRR Resp:CTA Abd: +BS, soft, NT/ND Ext: no edema  Recent Labs  Lab 10/21/22 0529 10/22/22 0554 10/23/22 0600 10/24/22 0500 10/25/22 0500 10/26/22 0500 10/27/22 0510  NA 139 141 144 143 146* 143 143  K 4.9 4.6 4.7 4.6 4.5 4.5 4.1  CL 109 113* 116* 116* 119* 118* 118*  CO2 19* 19* 20* 18* 18* 16* 16*  GLUCOSE 82 88 84 80 75 134* 114*  BUN 75* 74* 63* 56* 52* 54* 53*  CREATININE 6.83* 6.99* 6.47* 6.15* 6.03* 5.68* 5.48*  ALBUMIN 3.0* 2.8* 2.4* 2.2* 2.5* 2.6* 2.6*  CALCIUM 9.4 9.9 9.5 9.7 9.4 9.4 9.4  PHOS 5.3* 5.4*  --   --   --   --   --   AST _0 ALT _1 Liver Function Tests: Recent Labs  Lab 10/25/22 0500 10/26/22 0500 10/27/22 0510  AST _2 ALT _3 ALKPHOS 51 58 57  BILITOT 0.7 0.5 0.6  PROT 5.4* 5.7* 5.8*  ALBUMIN 2.5* 2.6* 2.6*   No results for input(s): "LIPASE", "AMYLASE" in the last 168 hours. No results for input(s): "AMMONIA" in the last 168 hours. CBC: Recent Labs  Lab 10/23/22 0600 10/23/22 0600 10/24/22 0500 10/25/22 0500 10/26/22 0500 10/27/22 0510  WBC 6.7  --  7.6 6.9 11.3* 6.1  NEUTROABS  --    < > 4.9 4.9 8.1* 3.2  HGB 7.7*  --  8.3* 7.7* 8.3* 8.3*  HCT 24.2*  --  26.1* 24.8* 26.0* 26.0*  MCV 86.7  --  87.3 89.2 88.1 87.2  PLT 160  --  159 151 154 133*   < > = values in this interval not  displayed.   Cardiac Enzymes: No results for input(s): "CKTOTAL", "CKMB", "CKMBINDEX", "TROPONINI" in the last 168 hours. CBG: No results for input(s): "GLUCAP" in the last 168 hours.  Iron Studies: No results for input(s): "IRON", "TIBC", "TRANSFERRIN", "FERRITIN" in the last 72 hours. Studies/Results: No results found.  sodium chloride   Intravenous Once   Chlorhexidine Gluconate Cloth  6 each Topical QHS   furosemide  20 mg Intravenous Once   furosemide  20 mg Intravenous Once   heparin injection (subcutaneous)  5,000 Units Subcutaneous Q8H   lactulose  20 g Oral BID   levothyroxine  88 mcg Oral QAC breakfast   megestrol  400 mg Oral BID   metoprolol tartrate  12.5 mg Oral BID   rosuvastatin  10 mg Oral Daily   senna-docusate  2 tablet Oral BID    BMET    Component Value Date/Time   NA 143 10/27/2022 0510   K 4.1 10/27/2022 0510   CL 118 (H) 10/27/2022 0510  CO2 16 (L) 10/27/2022 0510   GLUCOSE 114 (H) 10/27/2022 0510   BUN 53 (H) 10/27/2022 0510   CREATININE 5.48 (H) 10/27/2022 0510   CREATININE 2.22 (H) 10/11/2022 0848   CALCIUM 9.4 10/27/2022 0510   GFRNONAA 8 (L) 10/27/2022 0510   GFRNONAA 24 (L) 10/11/2022 0848   GFRAA 51 (L) 07/12/2015 1942   CBC    Component Value Date/Time   WBC 6.1 10/27/2022 0510   RBC 2.98 (L) 10/27/2022 0510   HGB 8.3 (L) 10/27/2022 0510   HGB 10.7 (L) 10/11/2022 0848   HCT 26.0 (L) 10/27/2022 0510   PLT 133 (L) 10/27/2022 0510   PLT 246 10/11/2022 0848   MCV 87.2 10/27/2022 0510   MCH 27.9 10/27/2022 0510   MCHC 31.9 10/27/2022 0510   RDW 17.4 (H) 10/27/2022 0510   LYMPHSABS 0.4 (L) 10/27/2022 0510   MONOABS 2.3 (H) 10/27/2022 0510   EOSABS 0.0 10/27/2022 0510   BASOSABS 0.0 10/27/2022 0510    Assessment/ Plan: AKI on CKD 3b - b/l creat 1.53- 2.26 from sept - nov 2023, eGFR 29- 38 ml/min. Creat here 7.7 on admission in setting of multiple myeloma w/ high LC's. UA  negative. CT abd noncontrast showed no GU obstruction.  Suspect AKI / CKD due to myeloma kidney. IVF"s dc'd, agree, she is eating and euvolemic on exam. Treatment is chemoRx. Per oncology is starting new chemoRx with cytoxan and carfilzomib, given 12/16. Scr improved since admission.  UOP has dropped off since stopping IVF's and poor po intake.  IVF's restarted 10/22/22 with increased UOP and improved BUN/Cr.  She is a poor candidate for long-term dialysis given her MM, multiple co-morbidities, poor functional and nutritional status.  Hopefully her renal function will continue to improve with treatment of her myeloma.  No uremic issues at this time.  Currently off of IVF's.  Nothing further to add and will sign off.  Will arrange for outpatient follow up in the next 3-4 weeks, sooner if needed.  Avoid nephrotoxic medications including NSAIDs and iodinated intravenous contrast exposure unless the latter is absolutely indicated.   Preferred narcotic agents for pain control are hydromorphone, fentanyl, and methadone. Morphine should not be used.  Avoid Baclofen and avoid oral sodium phosphate and magnesium citrate based laxatives / bowel preps.  Continue strict Input and Output monitoring. Will monitor the patient closely with you and intervene or adjust therapy as indicated by changes in clinical status/labs  L humerus fracture - pathologic due to MM.  Seen by Ortho today and plan for surgical intervention this week. Multiple myeloma - diagnosed June 2023 w/ extensive spinal disease. Recent admit showed malignant R pleural effusion. F/b Dr Marin Olp Hypernatremia - encouraged to increase water intake, however may require hypotonic IVF's if continues to rise. Anemia of malignancy - transfuse prn and awaiting blood from [place] due to antibodies. GOC - pt has sig complications from her aggressive cancer/ myeloma and now impending end-stage kidney failure is a real possibility. Have d/w pt briefly. Recommend Palliative care team to be consulted for [place]. Debilitation -  PT recommending SNF rehab.  She is non-weightbearing due to pathologic fractures.  Despite these facts, she is determined to go home with [facility].    Donetta Potts, MD [facility]

## 2022-10-27 NOTE — Progress Notes (Signed)
Notified by blood bank that second unit of blood is unavailable at this time.

## 2022-10-27 NOTE — Progress Notes (Incomplete Revision)
Notified by blood bank that second unit of blood is unavailable at this time.   0030 blood back notified writer that unit of blood arrived from [place]. Blood bank then notified writer that type and screen would need to be collected as previous collection expired at midnight. Type and screen collected and sent.  0300 blood not ready.

## 2022-10-28 DIAGNOSIS — N179 Acute kidney failure, unspecified: Secondary | ICD-10-CM | POA: Diagnosis not present

## 2022-10-28 DIAGNOSIS — Z01810 Encounter for preprocedural cardiovascular examination: Secondary | ICD-10-CM | POA: Diagnosis not present

## 2022-10-28 LAB — CBC WITH DIFFERENTIAL/PLATELET
Abs Immature Granulocytes: 0.14 10*3/uL — ABNORMAL HIGH (ref 0.00–0.07)
Basophils Absolute: 0 10*3/uL (ref 0.0–0.1)
Basophils Relative: 0 %
Eosinophils Absolute: 0 10*3/uL (ref 0.0–0.5)
Eosinophils Relative: 1 %
HCT: 30.4 % — ABNORMAL LOW (ref 36.0–46.0)
Hemoglobin: 9.8 g/dL — ABNORMAL LOW (ref 12.0–15.0)
Immature Granulocytes: 2 %
Lymphocytes Relative: 9 %
Lymphs Abs: 0.6 10*3/uL — ABNORMAL LOW (ref 0.7–4.0)
MCH: 28.4 pg (ref 26.0–34.0)
MCHC: 32.2 g/dL (ref 30.0–36.0)
MCV: 88.1 fL (ref 80.0–100.0)
Monocytes Absolute: 2.8 10*3/uL — ABNORMAL HIGH (ref 0.1–1.0)
Monocytes Relative: 42 %
Neutro Abs: 3 10*3/uL (ref 1.7–7.7)
Neutrophils Relative %: 46 %
Platelets: 124 10*3/uL — ABNORMAL LOW (ref 150–400)
RBC: 3.45 MIL/uL — ABNORMAL LOW (ref 3.87–5.11)
RDW: 17 % — ABNORMAL HIGH (ref 11.5–15.5)
WBC: 6.6 10*3/uL (ref 4.0–10.5)
nRBC: 11.6 % — ABNORMAL HIGH (ref 0.0–0.2)

## 2022-10-28 LAB — TYPE AND SCREEN
ABO/RH(D): A POS
Antibody Screen: POSITIVE
Unit division: 0
Unit division: 0

## 2022-10-28 LAB — BPAM RBC
Blood Product Expiration Date: 202401242359
Blood Product Expiration Date: 202401312359
ISSUE DATE / TIME: 202312241812
Unit Type and Rh: 5100
Unit Type and Rh: 5100

## 2022-10-28 LAB — COMPREHENSIVE METABOLIC PANEL
ALT: 13 U/L (ref 0–44)
AST: 23 U/L (ref 15–41)
Albumin: 2.7 g/dL — ABNORMAL LOW (ref 3.5–5.0)
Alkaline Phosphatase: 59 U/L (ref 38–126)
Anion gap: 7 (ref 5–15)
BUN: 50 mg/dL — ABNORMAL HIGH (ref 8–23)
CO2: 17 mmol/L — ABNORMAL LOW (ref 22–32)
Calcium: 9.5 mg/dL (ref 8.9–10.3)
Chloride: 118 mmol/L — ABNORMAL HIGH (ref 98–111)
Creatinine, Ser: 5.57 mg/dL — ABNORMAL HIGH (ref 0.44–1.00)
GFR, Estimated: 8 mL/min — ABNORMAL LOW (ref >60)
Glucose, Bld: 80 mg/dL (ref 70–99)
Potassium: 4.3 mmol/L (ref 3.5–5.1)
Sodium: 142 mmol/L (ref 135–145)
Total Bilirubin: 0.6 mg/dL (ref 0.3–1.2)
Total Protein: 5.6 g/dL — ABNORMAL LOW (ref 6.5–8.1)

## 2022-10-28 LAB — MAGNESIUM: Magnesium: 1.9 mg/dL (ref 1.7–2.4)

## 2022-10-28 MED ORDER — SODIUM CHLORIDE 0.9 % IV SOLN: INTRAVENOUS | Status: AC

## 2022-10-28 NOTE — Progress Notes (Signed)
PROGRESS NOTE    KILEY SOLIMINE  KKX:[id number] DOB: 02/10/1961 DOA: 10/17/2022 PCP: Leeroy Cha, MD   Brief Narrative:  61 y.o. female with medical history significant for CKD 3B, multiple myeloma and malignant pleural effusion status post talc pleurodesis, status post radiation therapy to the skull base, treated at cancer center, who initially presented to radiation oncology at the cancer center with complaints of generalized weakness, left arm pain, and vomiting. . X-rays were done. The patient was informed she had a humerus fracture and was advised to go to the ED for further management.  In the ED, workup revealed left humerus fracture, AKI and sinus tachycardia.  EDP discussed the case with orthopedic surgery, Dr. Marlou Sa.  Orthopedic surgery recommended outpatient follow-up on Monday 10/18/2022. The patient prefers to be admitted at [facility] long rather than [facility].  Admitted by [facility], hospitalist service.  Oncology consulted and on board. Nephrology on board for AKI. Dr Marlou Sa with orthopedics reconsulted to see if she needs surgical repair of the left humerus fracture. PT has recommended SNF but despite of extensive conversation with the patient and family, she is adamant to go home with home health instead of rehab facility.  Patient has received 1 unit   Assessment & Plan:  Principal Problem:   AKI (acute kidney injury) ([place]) Active Problems:   Palliative care by specialist   Preoperative cardiovascular examination       AKI on CKD stage IIIb In the setting of multiple myeloma.  CT scan does not show any evidence of obstruction or hydronephrosis.  Baseline creatinine is around 2.2 but during admission creatinine peaked at 7.7.  Nephrology team was consulted.  Avoid nephrotoxic drugs at this point. Creatinine improved, 5.57  Continue gentle hydration Continue gentle hydration   Multiple Myeloma:  She has light chain myeloma.  Diagnosed in June 2023 with extensive spinal  disease.  Status postchemotherapy per oncology yesterday.  She is received 1 unit of PRBC transfusion already, pending another unit per hematology.   Malignant right pleural effusion:  CXR shows  Right pleural effusion has progressed. Pleural based density right upper lobe appears slightly larger. She is on RA and does not appear to be symptomatic from the pleural effusion.  Appears comfortable.    Let humerus fracture:  - continue with splint.  Seen by [name], Dr Marlou Sa.  Surgery offered but patient is very hesitant at this point.  There is also lytic lesion in her hip which is at risk of pathologic fracture.   Pain control.  Bowel regimen.    Sinus tachycardia:  continue with lopressor 12.5 mg BID.  IV as needed     Hypothyroidism; Resume synthroid 88 mcg daily.     Anemia of chronic disease: Hemoglobin around 8. Plan 2 unit PRBC transfusion per oncology but currently awaiting her blood to be delivered from [place] due to multiple antibodies     Estimated body mass index is 29.59 kg/m as calculated from the following:   Height as of this encounter: 5' 4.25" (1.632 m).   Weight as of this encounter: 78.8 kg.   PT/OT = SNF but family wanting to take her to Home. Not sure how they will be able to take care of her. She is a complete NWB   DVT prophylaxis: heparin injection 5,000 Units Start: 10/22/22 2100 Code Status: Full Family Communication: Daughter at bedside  Status is: Inpatient Awaiting another unit of PRBC transfusion.  Hopefully home tomorrow    Subjective: Patient does  not have any complaints overall feeling well  Examination: Constitutional: Not in acute distress Respiratory: Clear to auscultation bilaterally Cardiovascular: Normal sinus rhythm, no rubs Abdomen: Nontender nondistended good bowel sounds Musculoskeletal: No edema noted Skin: No rashes seen Neurologic: CN 2-12 grossly intact.  And nonfocal Psychiatric: Normal judgment and insight. Alert and  oriented x 3. Normal mood.  Lft arm soft cast and sling Left chest port in place.   Objective: Vitals:   10/27/22 1800 10/27/22 1842 10/27/22 2107 10/28/22 0257  BP: 139/89 133/83 138/84 (!) 138/90  Pulse: (!) 115 (!) 108 98 95  Resp: _0 Temp: 98.4 F (36.9 C) 98.7 F (37.1 C) 98 F (36.7 C) 98.3 F (36.8 C)  TempSrc: Oral Oral Oral Oral  SpO2: 100% 100% 100% 100%  Weight:      Height:        Intake/Output Summary (Last 24 hours) at 10/28/2022 0933 Last data filed at 10/27/2022 2100 Gross per 24 hour  Intake 1717.5 ml  Output 100 ml  Net 1617.5 ml   Filed Weights   10/22/22 0559 10/24/22 0553 10/25/22 0625  Weight: 78.8 kg 80.1 kg 80.1 kg     Data Reviewed:   CBC: Recent Labs  Lab 10/24/22 0500 10/25/22 0500 10/26/22 0500 10/27/22 0510 10/28/22 0500  WBC 7.6 6.9 11.3* 6.1 6.6  NEUTROABS 4.9 4.9 8.1* 3.2 3.0  HGB 8.3* 7.7* 8.3* 8.3* 9.8*  HCT 26.1* 24.8* 26.0* 26.0* 30.4*  MCV 87.3 89.2 88.1 87.2 88.1  PLT 159 151 154 133* 916*   Basic Metabolic Panel: Recent Labs  Lab 10/22/22 0554 10/23/22 0600 10/24/22 0500 10/25/22 0500 10/26/22 0500 10/27/22 0510 10/28/22 0500  NA 141   < > 143 146* 143 143 142  K 4.6   < > 4.6 4.5 4.5 4.1 4.3  CL 113*   < > 116* 119* 118* 118* 118*  CO2 19*   < > 18* 18* 16* 16* 17*  GLUCOSE 88   < > 80 75 134* 114* 80  BUN 74*   < > 56* 52* 54* 53* 50*  CREATININE 6.99*   < > 6.15* 6.03* 5.68* 5.48* 5.57*  CALCIUM 9.9   < > 9.7 9.4 9.4 9.4 9.5  MG 2.4  --  1.9 1.9 1.9 1.9 1.9  PHOS 5.4*  --   --   --   --   --   --    < > = values in this interval not displayed.   GFR: Estimated Creatinine Clearance: 10.6 mL/min (A) (by C-G formula based on SCr of 5.57 mg/dL (H)). Liver Function Tests: Recent Labs  Lab 10/24/22 0500 10/25/22 0500 10/26/22 0500 10/27/22 0510 10/28/22 0500  AST _1 ALT _2 ALKPHOS 49 51 58 57 59  BILITOT 0.8 0.7 0.5 0.6 0.6  PROT 5.2* 5.4* 5.7* 5.8* 5.6*   ALBUMIN 2.2* 2.5* 2.6* 2.6* 2.7*   No results for input(s): "LIPASE", "AMYLASE" in the last 168 hours. No results for input(s): "AMMONIA" in the last 168 hours. Coagulation Profile: No results for input(s): "INR", "PROTIME" in the last 168 hours. Cardiac Enzymes: No results for input(s): "CKTOTAL", "CKMB", "CKMBINDEX", "TROPONINI" in the last 168 hours. BNP (last 3 results) No results for input(s): "PROBNP" in the last 8760 hours. HbA1C: No results for input(s): "HGBA1C" in the last 72 hours. CBG: No results for input(s): "GLUCAP" in the last 168 hours. Lipid Profile: No results for  input(s): "CHOL", "HDL", "LDLCALC", "TRIG", "CHOLHDL", "LDLDIRECT" in the last 72 hours. Thyroid Function Tests: No results for input(s): "TSH", "T4TOTAL", "FREET4", "T3FREE", "THYROIDAB" in the last 72 hours. Anemia Panel: No results for input(s): "VITAMINB12", "FOLATE", "FERRITIN", "TIBC", "IRON", "RETICCTPCT" in the last 72 hours. Sepsis Labs: No results for input(s): "PROCALCITON", "LATICACIDVEN" in the last 168 hours.  No results found for this or any previous visit (from the past 240 hour(s)).       Radiology Studies: No results found.      Scheduled Meds:  sodium chloride   Intravenous Once   Chlorhexidine Gluconate Cloth  6 each Topical QHS   furosemide  20 mg Intravenous Once   furosemide  20 mg Intravenous Once   heparin injection (subcutaneous)  5,000 Units Subcutaneous Q8H   lactulose  20 g Oral BID   levothyroxine  88 mcg Oral QAC breakfast   megestrol  400 mg Oral BID   metoprolol tartrate  12.5 mg Oral BID   rosuvastatin  10 mg Oral Daily   senna-docusate  2 tablet Oral BID   Continuous Infusions:  ondansetron 8 mg (10/28/22 0616)     LOS: 11 days   Time spent= 35 mins    Olney Monier Arsenio Loader, MD [facility]  If 7PM-7AM, please contact night-coverage  10/28/2022, 9:33 AM

## 2022-10-28 NOTE — Progress Notes (Signed)
Ms. Aughenbaugh seems to be doing okay.  Her kidney function has somewhat stabilized.  Her BUN is 50 creatinine 5.57.  She got 1 unit of blood yesterday.  Her hemoglobin is now up to 9.8.  Platelet count 124,000.  She still does not wish to have any surgery for the pathologic fractures.  She is eating okay.  There is no nausea or vomiting.  We still have her on scheduled Zofran which I am sure is helping.  She had her second week of chemotherapy last Friday.  She tolerated this well.  I do not have back her white blood cell count yet today.  She has had no bleeding.  She has had no cough or shortness of breath.  She has had no problems urinating.  She is not constipated.  Her vital signs show temperature of 98.3.  Pulse 95.  Blood pressure 138/90.  Her lungs sound clear bilaterally.  She has good air movement bilaterally.  Her oral exam does not show any mucositis.  Cardiac exam regular rate and rhythm.  She has no murmurs.  Abdomen is soft.  Bowel sounds are present.  There is no guarding or rebound tenderness.  Extremities shows no clubbing, cyanosis or edema.  Neurological exam is nonfocal.  Again, Ms. Pippen has the pathologic fractures.  She came in with the pathologic fracture of the left humerus.  She does not wish to have any surgery for that or for her hips.  Renal function has stabilized.  She tends to go down and then stabilize and then go down again with her BUN and creatinine.  I think she will have 1 more unit of blood.  Apparently, has been hard to crossmatch because of antibodies.  I am just glad that she is doing well with her pain.  I know that she is getting incredible care from everybody up on 6 E.  This is an incredibly complicated situation.  Everybody is doing a Chief Technology Officer job.   Jimmye Norman, MD  Tillie Fantasia

## 2022-10-28 NOTE — Progress Notes (Signed)
Pt's HgB is 9.8. RN instructed to give 1 unit of blood. Verified with provider. Will give and continue to monitor.

## 2022-10-28 NOTE — Progress Notes (Signed)
    Subjective:  Denies SSCP, palpitations or Dyspnea Indicates just waiting for hospital bed to be d/c No plans for surgery   Objective:  Vitals:   10/27/22 1800 10/27/22 1842 10/27/22 2107 10/28/22 0257  BP: 139/89 133/83 138/84 (!) 138/90  Pulse: (!) 115 (!) 108 98 95  Resp: '18 16 16 17  '$ Temp: 98.4 F (36.9 C) 98.7 F (37.1 C) 98 F (36.7 C) 98.3 F (36.8 C)  TempSrc: Oral Oral Oral Oral  SpO2: 100% 100% 100% 100%  Weight:      Height:        Intake/Output from previous day:  Intake/Output Summary (Last 24 hours) at 10/28/2022 0729 Last data filed at 10/27/2022 2100 Gross per 24 hour  Intake 1717.5 ml  Output 100 ml  Net 1617.5 ml    Physical Exam: Elderly black female Decreased BS right base S1/S2 Abdomen benign Left humeral fracture Trace edema Left sided port a cath  Lab Results: Basic Metabolic Panel: Recent Labs    10/27/22 0510 10/28/22 0500  NA 143 142  K 4.1 4.3  CL 118* 118*  CO2 16* 17*  GLUCOSE 114* 80  BUN 53* 50*  CREATININE 5.48* 5.57*  CALCIUM 9.4 9.5  MG 1.9 1.9   Liver Function Tests: Recent Labs    10/27/22 0510 10/28/22 0500  AST 21 23  ALT 12 13  ALKPHOS 57 59  BILITOT 0.6 0.6  PROT 5.8* 5.6*  ALBUMIN 2.6* 2.7*   No results for input(s): "LIPASE", "AMYLASE" in the last 72 hours. CBC: Recent Labs    10/27/22 0510 10/28/22 0500  WBC 6.1 6.6  NEUTROABS 3.2 PENDING  HGB 8.3* 9.8*  HCT 26.0* 30.4*  MCV 87.2 88.1  PLT 133* 124*     Imaging: No results found.  Cardiac Studies:  ECG: ST rate 111 normal otherwise    Telemetry:  NSR 10/28/2022   Echo: EF 65-70%   Medications:    sodium chloride   Intravenous Once   Chlorhexidine Gluconate Cloth  6 each Topical QHS   furosemide  20 mg Intravenous Once   furosemide  20 mg Intravenous Once   heparin injection (subcutaneous)  5,000 Units Subcutaneous Q8H   lactulose  20 g Oral BID   levothyroxine  88 mcg Oral QAC breakfast   megestrol  400 mg Oral BID    metoprolol tartrate  12.5 mg Oral BID   rosuvastatin  10 mg Oral Daily   senna-docusate  2 tablet Oral BID      ondansetron 8 mg (10/28/22 0616)    Assessment/Plan:    Preoperative: ok to have any surgery ECG non acute EF normal no history of CAD  Tachcycardia:  related to A/CRF anemia and pain from pathologic fracture Now improved with rates in low 90;s  had a unit of blood yesterday which helps  CRF:  in setting of myelomy Cr 5.57 associated with Hct 26 per nephrology  Ortho:  left humerus fx as well as left hip and lytic lesions in right femoral neck will need to be addressed by Dr Marlou Sa and ortho Pleural effusion prior right thoracentesis 09/17/22 repeat CXR pending  Myeloma: widely metastatic with pathologic fracture left humerus at risk both hips post tap of malignant pleural effusion ChemoRx last week being seen by Dr Marin Olp in hospital pain under good control   Spoke with daughter   Cardiology will sign off   Jenkins Rouge 10/28/2022, 7:29 AM

## 2022-10-29 DIAGNOSIS — N179 Acute kidney failure, unspecified: Secondary | ICD-10-CM | POA: Diagnosis not present

## 2022-10-29 LAB — BPAM RBC
Blood Product Expiration Date: 202401312359
ISSUE DATE / TIME: 202312251247
Unit Type and Rh: 5100

## 2022-10-29 LAB — TYPE AND SCREEN
ABO/RH(D): A POS
Antibody Screen: POSITIVE
DAT, IgG: NEGATIVE
Unit division: 0

## 2022-10-29 LAB — CBC
HCT: 35 % — ABNORMAL LOW (ref 36.0–46.0)
Hemoglobin: 11.4 g/dL — ABNORMAL LOW (ref 12.0–15.0)
MCH: 28.3 pg (ref 26.0–34.0)
MCHC: 32.6 g/dL (ref 30.0–36.0)
MCV: 86.8 fL (ref 80.0–100.0)
Platelets: 97 10*3/uL — ABNORMAL LOW (ref 150–400)
RBC: 4.03 MIL/uL (ref 3.87–5.11)
RDW: 17.6 % — ABNORMAL HIGH (ref 11.5–15.5)
WBC: 6.3 10*3/uL (ref 4.0–10.5)
nRBC: 5 % — ABNORMAL HIGH (ref 0.0–0.2)

## 2022-10-29 LAB — BASIC METABOLIC PANEL
Anion gap: 8 (ref 5–15)
BUN: 50 mg/dL — ABNORMAL HIGH (ref 8–23)
CO2: 16 mmol/L — ABNORMAL LOW (ref 22–32)
Calcium: 9.4 mg/dL (ref 8.9–10.3)
Chloride: 120 mmol/L — ABNORMAL HIGH (ref 98–111)
Creatinine, Ser: 4.88 mg/dL — ABNORMAL HIGH (ref 0.44–1.00)
GFR, Estimated: 9 mL/min — ABNORMAL LOW (ref >60)
Glucose, Bld: 118 mg/dL — ABNORMAL HIGH (ref 70–99)
Potassium: 4.3 mmol/L (ref 3.5–5.1)
Sodium: 144 mmol/L (ref 135–145)

## 2022-10-29 LAB — MAGNESIUM: Magnesium: 1.7 mg/dL (ref 1.7–2.4)

## 2022-10-29 MED ORDER — METOPROLOL TARTRATE 25 MG PO TABS: 12.5000 mg | ORAL_TABLET | Freq: Two times a day (BID) | ORAL | 0 refills | Status: AC

## 2022-10-29 MED ORDER — MEGESTROL ACETATE 400 MG/10ML PO SUSP: 400.0000 mg | Freq: Two times a day (BID) | ORAL | 0 refills | Status: AC

## 2022-10-29 MED ORDER — MELATONIN 3 MG PO TABS: 3.0000 mg | ORAL_TABLET | Freq: Every evening | ORAL | 0 refills | Status: DC | PRN

## 2022-10-29 MED ORDER — OXYCODONE HCL 10 MG PO TABS: 5.0000 mg | ORAL_TABLET | Freq: Four times a day (QID) | ORAL | 0 refills | Status: AC | PRN

## 2022-10-29 MED ORDER — POTASSIUM CHLORIDE CRYS ER 20 MEQ PO TBCR
40.0000 meq | EXTENDED_RELEASE_TABLET | Freq: Once | ORAL | Status: AC
  Administered 2022-10-29: 40 meq via ORAL
  Filled 2022-10-29: qty 2

## 2022-10-29 NOTE — Progress Notes (Signed)
PROGRESS NOTE    Michelle [name]  MGQ:[id number] DOB: Nov 01, 1961 DOA: 10/17/2022 PCP: Leeroy Cha, MD   Brief Narrative:  61 y.o. female with medical history significant for CKD 3B, multiple myeloma and malignant pleural effusion status post talc pleurodesis, status post radiation therapy to the skull base, treated at cancer center, who initially presented to radiation oncology at the cancer center with complaints of generalized weakness, left arm pain, and vomiting. . X-rays were done. The patient was informed she had a humerus fracture and was advised to go to the ED for further management.  In the ED, workup revealed left humerus fracture, AKI and sinus tachycardia.  EDP discussed the case with orthopedic surgery, Dr. Marlou Sa.  Orthopedic surgery recommended outpatient follow-up on Monday 10/18/2022. The patient prefers to be admitted at [facility] rather than [facility].  Admitted by [facility], hospitalist service.  Oncology consulted and on board. Nephrology on board for AKI. Dr Marlou Sa with orthopedics reconsulted to see if she needs surgical repair of the left humerus fracture. PT has recommended SNF but despite of extensive conversation with the patient and family, she is adamant to go home with home health instead of rehab facility.  At this time patient has received multiple units of PRBC, feeling better.  Awaiting hospital bed to be delivered at home so she can be discharged.   Assessment & Plan:  Principal Problem:   AKI (acute kidney injury) ([place]) Active Problems:   Palliative care by specialist   Preoperative cardiovascular examination       AKI on CKD stage IIIb In the setting of multiple myeloma.  CT scan does not show any evidence of obstruction or hydronephrosis.  Baseline creatinine is around 2.2 but during admission creatinine peaked at 7.7.  Nephrology team was consulted.  Avoid nephrotoxic drugs at this point. Creatinine is improved and today is 4.88   Multiple Myeloma:   She has light chain myeloma.  Diagnosed in June 2023 with extensive spinal disease.  Status postchemotherapy per oncology yesterday.  Status post units of PRBC transfusion per oncology, hemoglobin 11.4   Malignant right pleural effusion:  CXR shows  Right pleural effusion has progressed. Pleural based density right upper lobe appears slightly larger. She is on RA and does not appear to be symptomatic from the pleural effusion.  Appears comfortable.    Let humerus fracture:  - continue with splint.  Seen by [name], Dr Marlou Sa.  Surgery offered but patient is very hesitant at this point.  There is also lytic lesion in her hip which is at risk of pathologic fracture.   Pain control.  Bowel regimen.    Sinus tachycardia:  continue with lopressor 12.5 mg BID.  IV as needed     Hypothyroidism; Resume synthroid 88 mcg daily.     Anemia of chronic disease: Hemoglobin around 8. Plan 2 unit PRBC transfusion per oncology but currently awaiting her blood to be delivered from [place] due to multiple antibodies     Estimated body mass index is 29.59 kg/m as calculated from the following:   Height as of this encounter: 5' 4.25" (1.632 m).   Weight as of this encounter: 78.8 kg.   PT/OT = SNF but family wanting to take her to Home. Not sure how they will be able to take care of her. She is a complete NWB   DVT prophylaxis: heparin injection 5,000 Units Start: 10/22/22 2100 Code Status: Full Family Communication: Daughter at bedside  Status is: Inpatient Awaiting hospital  bed to be delivered at home so she can be discharged.  Hopefully we can discharge her later today, if not tomorrow  Subjective: Patient has no new complaints.  Examination: Constitutional: Not in acute distress Respiratory: Clear to auscultation bilaterally Cardiovascular: Normal sinus rhythm, no rubs Abdomen: Nontender nondistended good bowel sounds Musculoskeletal: No edema noted Skin: No rashes seen Neurologic: CN  2-12 grossly intact.  And nonfocal Psychiatric: Normal judgment and insight. Alert and oriented x 3. Normal mood.  Lft arm soft cast and sling Left chest port in place.   Objective: Vitals:   10/28/22 1515 10/28/22 1615 10/28/22 2206 10/29/22 0434  BP: (!) 142/85 138/79 136/82 (!) 146/81  Pulse: (!) 103 (!) 101 100 90  Resp: _0 Temp: 98.7 F (37.1 C) 98.4 F (36.9 C) 98 F (36.7 C) 98.9 F (37.2 C)  TempSrc: Oral Oral Oral Oral  SpO2: 100% 100% 100% 100%  Weight:      Height:        Intake/Output Summary (Last 24 hours) at 10/29/2022 1138 Last data filed at 10/29/2022 0900 Gross per 24 hour  Intake 705.77 ml  Output 50 ml  Net 655.77 ml   Filed Weights   10/22/22 0559 10/24/22 0553 10/25/22 0625  Weight: 78.8 kg 80.1 kg 80.1 kg     Data Reviewed:   CBC: Recent Labs  Lab 10/24/22 0500 10/25/22 0500 10/26/22 0500 10/27/22 0510 10/28/22 0500 10/29/22 0533  WBC 7.6 6.9 11.3* 6.1 6.6 6.3  NEUTROABS 4.9 4.9 8.1* 3.2 3.0  --   HGB 8.3* 7.7* 8.3* 8.3* 9.8* 11.4*  HCT 26.1* 24.8* 26.0* 26.0* 30.4* 35.0*  MCV 87.3 89.2 88.1 87.2 88.1 86.8  PLT 159 151 154 133* 124* 97*   Basic Metabolic Panel: Recent Labs  Lab 10/25/22 0500 10/26/22 0500 10/27/22 0510 10/28/22 0500 10/29/22 0533 10/29/22 0830  NA 146* 143 143 142  --  144  K 4.5 4.5 4.1 4.3  --  4.3  CL 119* 118* 118* 118*  --  120*  CO2 18* 16* 16* 17*  --  16*  GLUCOSE 75 134* 114* 80  --  118*  BUN 52* 54* 53* 50*  --  50*  CREATININE 6.03* 5.68* 5.48* 5.57*  --  4.88*  CALCIUM 9.4 9.4 9.4 9.5  --  9.4  MG 1.9 1.9 1.9 1.9 1.7  --    GFR: Estimated Creatinine Clearance: 12.1 mL/min (A) (by C-G formula based on SCr of 4.88 mg/dL (H)). Liver Function Tests: Recent Labs  Lab 10/24/22 0500 10/25/22 0500 10/26/22 0500 10/27/22 0510 10/28/22 0500  AST _1 ALT _2 ALKPHOS 49 51 58 57 59  BILITOT 0.8 0.7 0.5 0.6 0.6  PROT 5.2* 5.4* 5.7* 5.8* 5.6*  ALBUMIN 2.2*  2.5* 2.6* 2.6* 2.7*   No results for input(s): "LIPASE", "AMYLASE" in the last 168 hours. No results for input(s): "AMMONIA" in the last 168 hours. Coagulation Profile: No results for input(s): "INR", "PROTIME" in the last 168 hours. Cardiac Enzymes: No results for input(s): "CKTOTAL", "CKMB", "CKMBINDEX", "TROPONINI" in the last 168 hours. BNP (last 3 results) No results for input(s): "PROBNP" in the last 8760 hours. HbA1C: No results for input(s): "HGBA1C" in the last 72 hours. CBG: No results for input(s): "GLUCAP" in the last 168 hours. Lipid Profile: No results for input(s): "CHOL", "HDL", "LDLCALC", "TRIG", "CHOLHDL", "LDLDIRECT" in the last 72 hours. Thyroid Function Tests: No results for  input(s): "TSH", "T4TOTAL", "FREET4", "T3FREE", "THYROIDAB" in the last 72 hours. Anemia Panel: No results for input(s): "VITAMINB12", "FOLATE", "FERRITIN", "TIBC", "IRON", "RETICCTPCT" in the last 72 hours. Sepsis Labs: No results for input(s): "PROCALCITON", "LATICACIDVEN" in the last 168 hours.  No results found for this or any previous visit (from the past 240 hour(s)).       Radiology Studies: No results found.      Scheduled Meds:  sodium chloride   Intravenous Once   Chlorhexidine Gluconate Cloth  6 each Topical QHS   heparin injection (subcutaneous)  5,000 Units Subcutaneous Q8H   lactulose  20 g Oral BID   levothyroxine  88 mcg Oral QAC breakfast   megestrol  400 mg Oral BID   metoprolol tartrate  12.5 mg Oral BID   rosuvastatin  10 mg Oral Daily   senna-docusate  2 tablet Oral BID   Continuous Infusions:  ondansetron 8 mg (10/29/22 0531)     LOS: 12 days   Time spent= 35 mins    Remee Charley Arsenio Loader, MD [facility]  If 7PM-7AM, please contact night-coverage  10/29/2022, 11:38 AM

## 2022-10-29 NOTE — TOC Progression Note (Signed)
Transition of Care [facility]) - Progression Note   Patient Details  Name: Michelle [name] MRN: [id number] Date of Birth: 1961/03/04  Transition of Care [facility]) CM/SW [place], LCSW Phone Number: 10/29/2022, 12:51 PM  Clinical Narrative: Patient's daughter is wanting to get a hospital bed for free through senior services where she works, but there will be a 2 day delay with delivery. CSW followed up with Brenton Grills with [employer] and was informed the insurance authorization for the hospital bed and hoyer lift are still pending. Daughter wants to proceed with [employer] delivering the hoyer lift only and the free hospital bed will be delivered to the home in 2 days. CSW updated hospitalist and RN.  Expected Discharge Plan: [place] Barriers to Discharge: Continued Medical Work up  Expected Discharge Plan and Services In-house Referral: NA Discharge Planning Services: CM Consult Post Acute Care Choice: Home Health, Durable Medical Equipment Living arrangements for the past 2 months: Single Family Home Expected Discharge Date: 10/29/22               DME Arranged: Wheelchair manual, Other see comment Harrel Lemon lift) DME Agency: [employer] Date DME Agency Contacted: 10/23/22 Time DME Agency Contacted: 0630 Representative spoke with at DME Agency: Melene Muller HH Arranged: PT, OT [place] Agency: [place] Date [place]: 10/23/22 Time [place]: 1601 Representative spoke with at [place]: [place] ([place]) Interventions [place]: No Food Insecurity (10/18/2022)  Housing: Low Risk  (10/18/2022)  Transportation Needs: No Transportation Needs (10/18/2022)  Utilities: Not At Risk (10/18/2022)  Tobacco Use: Medium Risk (10/22/2022)   Readmission Risk Interventions    10/23/2022    1:47 PM 10/03/2022    8:34 AM  Readmission Risk Prevention Plan  Transportation Screening Complete  Complete  PCP or Specialist Appt within 3-5 Days  Complete  HRI or [place]  Complete  Social Work Consult for [place] Planning/Counseling  [place]  Not Applicable  Medication Review Press photographer) Complete Complete  PCP or Specialist appointment within 3-5 days of discharge Complete   HRI or [place] Complete   SW Recovery Care/Counseling Consult Complete   [place] Not Applicable

## 2022-10-29 NOTE — Progress Notes (Signed)
Chaplain met with Michelle [name] and her daughter, Michelle [name], to provide support.  Michelle [name] was appreciative but did not express any concerns at this time.  She is looking forward to going home and being with her family and is focused on the logistics of that at this time.  If further needs arise, please page Michelle [name].   [address], [place] Pager, [phone]

## 2022-10-29 NOTE — Progress Notes (Signed)
Michelle [name] is a little bit tired this morning.  She had bloody yesterday.  I think she did well with this.  She is not complaining of any pain.  She did not eat as much yesterday.  There was a little bit of nausea.  She is on scheduled Zofran.  I suspect this might be from her chemotherapy that she had on Friday.  I really do appreciate cardiology helping out.  Her CBC today shows once again of 6.3.  Hemoglobin 11.4.  Platelet count 97,000.  A metabolic panel is not yet back.  She still does not want to have any surgery for these fractures secondary to the myeloma.  She has had no diarrhea.  She has had no constipation.  There has been no bleeding.  She has had no shortness of breath.  I think the issue is where to move her to.  I do not believe she can go home.  She does not want to have any surgery for the pathologic fractures.  She will have to be placed in a facility that we will be able to handle these fractures and the fact that she needs once a week chemotherapy.  Her vital signs show temperature of 98.9.  Pulse 90.  Blood pressure 146/81.  Her head neck exam shows no ocular or oral lesions.  There are no palpable cervical or supraclavicular lymph nodes.  Lungs are clear bilaterally.  She has good air movement bilaterally.  Cardiac exam regular rate and rhythm.  There is no premature beats.  Abdominal exam is soft.  Bowel sounds are present.  She has decent bowel sounds.  Extremities shows the left arm with a soft cast on.  Neurological exam is nonfocal.  Michelle [name] has a aggressive myeloma.  She has gotten inpatient chemotherapy so far.  Again I think she is done well with this.  We we will have to see how her renal function trends.  I really do believe that she would benefit from surgery to repair these pathologic fractures.  However, she does not wish to have these.  We will continue to follow along.  Hopefully, she will be able to have a place to go to soon.  I know the staff on [address]  have done a superb job in taking care of her.  [name], MD  [place] 12:12

## 2022-10-30 DIAGNOSIS — N1832 Chronic kidney disease, stage 3b: Secondary | ICD-10-CM

## 2022-10-30 DIAGNOSIS — C9000 Multiple myeloma not having achieved remission: Secondary | ICD-10-CM | POA: Diagnosis not present

## 2022-10-30 DIAGNOSIS — D631 Anemia in chronic kidney disease: Secondary | ICD-10-CM

## 2022-10-30 LAB — BASIC METABOLIC PANEL
Anion gap: 6 (ref 5–15)
BUN: 47 mg/dL — ABNORMAL HIGH (ref 8–23)
CO2: 16 mmol/L — ABNORMAL LOW (ref 22–32)
Calcium: 9.4 mg/dL (ref 8.9–10.3)
Chloride: 120 mmol/L — ABNORMAL HIGH (ref 98–111)
Creatinine, Ser: 4.64 mg/dL — ABNORMAL HIGH (ref 0.44–1.00)
GFR, Estimated: 10 mL/min — ABNORMAL LOW (ref >60)
Glucose, Bld: 91 mg/dL (ref 70–99)
Potassium: 4.3 mmol/L (ref 3.5–5.1)
Sodium: 142 mmol/L (ref 135–145)

## 2022-10-30 LAB — MAGNESIUM: Magnesium: 1.6 mg/dL — ABNORMAL LOW (ref 1.7–2.4)

## 2022-10-30 NOTE — Progress Notes (Signed)
PROGRESS NOTE    Michelle [name]  LMB:[id number] DOB: 02/23/1961 DOA: 10/17/2022 PCP: Leeroy Cha, MD   Brief Narrative:  61 y.o. female with medical history significant for CKD 3B, multiple myeloma and malignant pleural effusion status post talc pleurodesis, status post radiation therapy to the skull base, treated at cancer center, who initially presented to radiation oncology at the cancer center with complaints of generalized weakness, left arm pain, and vomiting. . X-rays were done. The patient was informed she had a humerus fracture and was advised to go to the ED for further management.  In the ED, workup revealed left humerus fracture, AKI and sinus tachycardia.  EDP discussed the case with orthopedic surgery, Dr. Marlou Sa.  Orthopedic surgery recommended outpatient follow-up on Monday 10/18/2022. The patient prefers to be admitted at [facility] rather than [facility].  Admitted by [facility], hospitalist service.  Oncology consulted and on board. Nephrology on board for AKI. Dr Marlou Sa with orthopedics reconsulted to see if she needs surgical repair of the left humerus fracture. PT has recommended SNF but despite extensive conversation with the patient and family, she is adamant to go home with home health instead of rehab facility.  At this time patient has received multiple units of PRBC, feeling better.  Awaiting hospital bed to be delivered at home so she can be discharged.   Assessment & Plan:   AKI on CKD stage IIIb In the setting of multiple myeloma.  CT scan does not show any evidence of obstruction or hydronephrosis.  Baseline creatinine is around 2.2 but during admission creatinine peaked at 7.7.  Nephrology team was consulted.  Avoid nephrotoxic drugs at this point. Creatinine has been improving.  Had improved to 4.88 yesterday.  Labs are pending from today.   Multiple Myeloma:  She has light chain myeloma.  Diagnosed in June 2023 with extensive spinal disease.  Status post  chemotherapy per oncology.   Status post units of PRBC transfusion per oncology.   Malignant right pleural effusion:  CXR shows  Right pleural effusion has progressed. Pleural based density right upper lobe appears slightly larger. Saturating normal on room air.  Appears to be comfortable.   Let humerus fracture:  Continue with splint.  Seen by [name], Dr Marlou Sa.  Surgery offered but patient is very hesitant at this point.  There is also lytic lesion in her hip which is at risk of pathologic fracture.   Pain control.  Bowel regimen.   Impending hip fractures She has bilateral proximal femoral metastatic lesions.  The left one is more unstable than the right.  Patient has declined surgery.  She has to be nonweightbearing due to high risk of fracture.  Sinus tachycardia Continue with lopressor 12.5 mg BID.  IV as needed   Hypothyroidism; Continue levothyroxine.     Anemia of chronic disease: Received 2 units of PRBC.  There was a long wait for the blood products due to antibodies.  Hemoglobin improved 11.4 as of yesterday.  Recheck labs tomorrow.    Thrombocytopenia Drop in platelet counts noted yesterday to 97,000.  Will recheck labs tomorrow.  No evidence of overt bleeding.     DVT prophylaxis: heparin injection 5,000 Units Start: 10/22/22 2100 Code Status: Full Family Communication: Daughter at bedside Disposition: SNF was recommended but patient elects to go home.  Waiting on delivery of hospital bed.  Status is: Inpatient Awaiting hospital bed to be delivered at home so she can be discharged.  Hopefully we can discharge her later  today, if not tomorrow  Subjective: Patient wondering why she has not been ambulated.  Nuys any significant pain.  No nausea or vomiting.  Waiting on hospital bed to be delivered so that she can go back home.   Objective: Vitals:   10/29/22 0434 10/29/22 1423 10/29/22 2008 10/30/22 0515  BP: (!) 146/81 137/85 138/87 137/85  Pulse: 90 90 (!) 102 94   Resp: _0 Temp: 98.9 F (37.2 C) 98 F (36.7 C) 98.4 F (36.9 C) 98.2 F (36.8 C)  TempSrc: Oral Oral Oral Oral  SpO2: 100% 99% 99% 100%  Weight:    80.5 kg  Height:        Intake/Output Summary (Last 24 hours) at 10/30/2022 1338 Last data filed at 10/30/2022 0500 Gross per 24 hour  Intake 140 ml  Output 1100 ml  Net -960 ml    Filed Weights   10/24/22 0553 10/25/22 0625 10/30/22 0515  Weight: 80.1 kg 80.1 kg 80.5 kg    Examination:  General appearance: Awake alert.  In no distress Resp: Clear to auscultation bilaterally.  Normal effort Cardio: S1-S2 is normal regular.  No S3-S4.  No rubs murmurs or bruit GI: Abdomen is soft.  Nontender nondistended.  Bowel sounds are present normal.  No masses organomegaly   Data Reviewed:   CBC: Recent Labs  Lab 10/24/22 0500 10/25/22 0500 10/26/22 0500 10/27/22 0510 10/28/22 0500 10/29/22 0533  WBC 7.6 6.9 11.3* 6.1 6.6 6.3  NEUTROABS 4.9 4.9 8.1* 3.2 3.0  --   HGB 8.3* 7.7* 8.3* 8.3* 9.8* 11.4*  HCT 26.1* 24.8* 26.0* 26.0* 30.4* 35.0*  MCV 87.3 89.2 88.1 87.2 88.1 86.8  PLT 159 151 154 133* 124* 97*    Basic Metabolic Panel: Recent Labs  Lab 10/25/22 0500 10/26/22 0500 10/27/22 0510 10/28/22 0500 10/29/22 0533 10/29/22 0830  NA 146* 143 143 142  --  144  K 4.5 4.5 4.1 4.3  --  4.3  CL 119* 118* 118* 118*  --  120*  CO2 18* 16* 16* 17*  --  16*  GLUCOSE 75 134* 114* 80  --  118*  BUN 52* 54* 53* 50*  --  50*  CREATININE 6.03* 5.68* 5.48* 5.57*  --  4.88*  CALCIUM 9.4 9.4 9.4 9.5  --  9.4  MG 1.9 1.9 1.9 1.9 1.7  --     GFR: Estimated Creatinine Clearance: 12.2 mL/min (A) (by C-G formula based on SCr of 4.88 mg/dL (H)).  Liver Function Tests: Recent Labs  Lab 10/24/22 0500 10/25/22 0500 10/26/22 0500 10/27/22 0510 10/28/22 0500  AST _1 ALT _2 ALKPHOS 49 51 58 57 59  BILITOT 0.8 0.7 0.5 0.6 0.6  PROT 5.2* 5.4* 5.7* 5.8* 5.6*  ALBUMIN 2.2* 2.5* 2.6* 2.6* 2.7*       Radiology Studies: No results found.   Scheduled Meds:  sodium chloride   Intravenous Once   Chlorhexidine Gluconate Cloth  6 each Topical QHS   heparin injection (subcutaneous)  5,000 Units Subcutaneous Q8H   lactulose  20 g Oral BID   levothyroxine  88 mcg Oral QAC breakfast   megestrol  400 mg Oral BID   metoprolol tartrate  12.5 mg Oral BID   rosuvastatin  10 mg Oral Daily   senna-docusate  2 tablet Oral BID   Continuous Infusions:  ondansetron 8 mg (10/30/22 0517)     LOS: 13 days  Bonnielee Haff, MD [facility]  If 7PM-7AM, please contact night-coverage  10/30/2022, 1:38 PM

## 2022-10-30 NOTE — Progress Notes (Signed)
PHYSICAL THERAPY  Pt has been evaluated and plans to return home awaiting delivery of Hospital bed and [facility].  Will continue to follow.    Rica Koyanagi  PTA Acute  Rehabilitation Services Office M-F          [phone] Weekend pager [phone]

## 2022-10-31 DIAGNOSIS — N179 Acute kidney failure, unspecified: Secondary | ICD-10-CM | POA: Diagnosis not present

## 2022-10-31 LAB — CBC
HCT: 32.6 % — ABNORMAL LOW (ref 36.0–46.0)
Hemoglobin: 10.5 g/dL — ABNORMAL LOW (ref 12.0–15.0)
MCH: 28.8 pg (ref 26.0–34.0)
MCHC: 32.2 g/dL (ref 30.0–36.0)
MCV: 89.3 fL (ref 80.0–100.0)
Platelets: 87 10*3/uL — ABNORMAL LOW (ref 150–400)
RBC: 3.65 MIL/uL — ABNORMAL LOW (ref 3.87–5.11)
RDW: 19.7 % — ABNORMAL HIGH (ref 11.5–15.5)
WBC: 4.6 10*3/uL (ref 4.0–10.5)
nRBC: 1.1 % — ABNORMAL HIGH (ref 0.0–0.2)

## 2022-10-31 LAB — BASIC METABOLIC PANEL
Anion gap: 7 (ref 5–15)
BUN: 42 mg/dL — ABNORMAL HIGH (ref 8–23)
CO2: 18 mmol/L — ABNORMAL LOW (ref 22–32)
Calcium: 9.8 mg/dL (ref 8.9–10.3)
Chloride: 120 mmol/L — ABNORMAL HIGH (ref 98–111)
Creatinine, Ser: 4.48 mg/dL — ABNORMAL HIGH (ref 0.44–1.00)
GFR, Estimated: 10 mL/min — ABNORMAL LOW (ref >60)
Glucose, Bld: 87 mg/dL (ref 70–99)
Potassium: 4.4 mmol/L (ref 3.5–5.1)
Sodium: 145 mmol/L (ref 135–145)

## 2022-10-31 MED ORDER — FAMOTIDINE 20 MG PO TABS
40.0000 mg | ORAL_TABLET | Freq: Every day | ORAL | Status: DC
  Administered 2022-10-31: 40 mg via ORAL
  Filled 2022-10-31: qty 2

## 2022-10-31 MED ORDER — LORAZEPAM 0.5 MG PO TABS: 0.5000 mg | ORAL_TABLET | Freq: Two times a day (BID) | ORAL | 0 refills | Status: DC

## 2022-10-31 MED ORDER — LORAZEPAM 0.5 MG PO TABS
0.5000 mg | ORAL_TABLET | Freq: Two times a day (BID) | ORAL | Status: DC
  Administered 2022-10-31: 0.5 mg via ORAL
  Filled 2022-10-31: qty 1

## 2022-10-31 NOTE — Progress Notes (Signed)
Ms. Hocevar is still in the hospital.  Hopefully, she will be able to go home today.  They are awaiting the delivery of a hospital bed for her.  I think the real problem is that she just does not have a lot of mobility.  I know that in the hospital, she really has not been able to get out of bed.  I know she has these impending fractures in the femurs bilaterally.  I know that Orthopedic Surgery wants her not to put pressure on her thighs.  She also has a pathologic fracture of the left humerus.  Her left arm is in a sling.  She still is not willing to have surgery to help repair these.  We are supposed to treat in the office tomorrow.  However, because of government rules, we probably had to put off treatment till next week.  She still has nausea.  I think she probably needs to have some kind of an acid.  I will put her on Pepcid 40 mg a day to see if this may help a little bit.  We will try her on some low-dose lorazepam and see if this might be able to help with some of the nausea that she has.  The only labs we have back today are the CBC.  Her white cell count is 4.6.  Hemoglobin 10.5.  Platelet count 87,000.  She denies any problem with bowels or bladder.  Yesterday, her BUN and creatinine were 47 and 4.6.  This continues to improve which is encouraging.  She has had no bleeding.  There is been no cough or shortness of breath.  Again, she should be going home today if the hospital bed is delivered.  If, she does not have a hospital bed that is can be delivered, we may see about doing treatment in the hospital.  Her vital signs show temperature of 97.9.  Pulse 77.  Blood pressure 140/77.  Her head and neck exam shows no oral lesions.  She has no adenopathy in the neck.  Extraocular muscles appear to be intact.  Lungs sound clear bilaterally.  She has good breath sounds bilaterally.  Cardiac exam regular rate and rhythm.  She has no murmurs.  Abdomen is soft.  Bowel sounds are present.   There is no fluid wave.  There is no palpable liver or spleen tip.  Extremity shows no clubbing, cyanosis or edema.  Skin exam shows no rashes, ecchymosis or petechia.  Neurological exam shows no focal neurological deficits.  Again, Ms. Crabbe might be going home today and the hospital bed is delivered.  Again my concern is her relative lack of mobility.  I just hate to see her stay in bed.  I really think that surgery could have helped her a bunch with respect to increased mobility.  She just does not feel that she needs this right now.  We will have to plan for her next cycle of treatment.  I suspect we might have to do this in the office early next week.  As always, her faith is incredibly strong.  We always have a very good prayer.  I know that she is relying on her faith to heal her.  [name], MD  [place] 6:10

## 2022-10-31 NOTE — Plan of Care (Signed)
Patient Michelle [name], forgetful at times.  VSS throughout shift.  All meds given on time as ordered.  Pt Diminished lungs, IS encouraged.  Purewick in place.  Morning labs drawn off port.  POC maintained, will continue to monitor.  Problem: Education: Goal: Knowledge of General Education information will improve Description: Including pain rating scale, medication(s)/side effects and non-pharmacologic comfort measures Outcome: Progressing   Problem: Health Behavior/Discharge Planning: Goal: Ability to manage health-related needs will improve Outcome: Progressing   Problem: Clinical Measurements: Goal: Ability to maintain clinical measurements within normal limits will improve Outcome: Progressing Goal: Will remain free from infection Outcome: Progressing Goal: Diagnostic test results will improve Outcome: Progressing Goal: Respiratory complications will improve Outcome: Progressing Goal: Cardiovascular complication will be avoided Outcome: Progressing   Problem: Activity: Goal: Risk for activity intolerance will decrease Outcome: Progressing   Problem: Nutrition: Goal: Adequate nutrition will be maintained Outcome: Progressing   Problem: Coping: Goal: Level of anxiety will decrease Outcome: Progressing   Problem: Elimination: Goal: Will not experience complications related to bowel motility Outcome: Progressing Goal: Will not experience complications related to urinary retention Outcome: Progressing   Problem: Pain Managment: Goal: General experience of comfort will improve Outcome: Progressing   Problem: Safety: Goal: Ability to remain free from injury will improve Outcome: Progressing   Problem: Skin Integrity: Goal: Risk for impaired skin integrity will decrease Outcome: Progressing

## 2022-10-31 NOTE — Discharge Summary (Signed)
[facility]  Physician Discharge Summary   Patient ID: Michelle [name] MRN: [id number] DOB/AGE: 06-11-1961 61 y.o.  Admit date: 10/17/2022 Discharge date:   10/31/2022   PCP: Leeroy Cha, MD  DISCHARGE DIAGNOSES:    AKI (acute kidney injury) ([place]) Chronic kidney disease stage IIIb Multiple myeloma Malignant right pleural effusion Impending fractures Left humerus fracture Hypothyroidism Anemia of chronic disease Thrombocytopenia    RECOMMENDATIONS FOR OUTPATIENT FOLLOW UP: Outpatient follow-up with hematology/oncology Nonweightbearing bilateral lower extremities    Home Health: PT and OT Equipment/Devices: None  CODE STATUS: Full code  DISCHARGE CONDITION: fair  Diet recommendation: As before  INITIAL HISTORY: 61 y.o. female with medical history significant for CKD 3B, multiple myeloma and malignant pleural effusion status post talc pleurodesis, status post radiation therapy to the skull base, treated at cancer center, who initially presented to radiation oncology at the cancer center with complaints of generalized weakness, left arm pain, and vomiting. . X-rays were done. The patient was informed she had a humerus fracture and was advised to go to the ED for further management.  In the ED, workup revealed left humerus fracture, AKI and sinus tachycardia.  EDP discussed the case with orthopedic surgery, Dr. Marlou Sa.  Orthopedic surgery recommended outpatient follow-up on Monday 10/18/2022. The patient prefers to be admitted at [facility] rather than [facility].  Admitted by [facility], hospitalist service.  Oncology consulted and on board. Nephrology on board for AKI. Dr Marlou Sa with orthopedics reconsulted to see if she needs surgical repair of the left humerus fracture. PT has recommended SNF but despite extensive conversation with the patient and family, she is adamant to go home with home health instead of rehab facility.  At this time patient has received multiple  units of PRBC, feeling better.  Awaiting hospital bed to be delivered at home so she can be discharged.   Consultations: Medical oncology Dr. Marlou Sa with orthopedic surgery Palliative medicine   HOSPITAL COURSE:   AKI on CKD stage IIIb In the setting of multiple myeloma.  CT scan does not show any evidence of obstruction or hydronephrosis.  Baseline creatinine is around 2.2 but during admission creatinine peaked at 7.7.  Nephrology team was consulted.  Creatinine has been improving.  Nephrology has signed off.  She has good urine output.  Creatinine can be followed up by outpatient providers.   Multiple Myeloma:  She has light chain myeloma.  Diagnosed in June 2023 with extensive spinal disease.  Status post chemotherapy per oncology.   Status post units of PRBC transfusion per oncology.   Malignant right pleural effusion:  CXR shows  Right pleural effusion has progressed. Pleural based density right upper lobe appears slightly larger. Saturating normal on room air.  Appears to be comfortable.   Let humerus fracture:  Continue with splint.  Seen by [name], Dr Marlou Sa.  Surgery offered but patient is very hesitant at this point.  There is also lytic lesion in her hip which is at risk of pathologic fracture.   Pain control.  Bowel regimen.    Impending hip fractures She has bilateral proximal femoral metastatic lesions.  The left one is more unstable than the right.  Patient has declined surgery.  She has to be nonweightbearing due to high risk of fracture.  This was explained to patient and her daughter.   Sinus tachycardia Continue with metoprolol   Hypothyroidism; Continue levothyroxine.     Anemia of chronic disease: Received 2 units of PRBC.  There was a long  wait for the blood products due to antibodies.     Thrombocytopenia Drop in platelet counts noted.  Likely due to multiple myeloma.  No evidence of bleeding.  Oncology to follow as an outpatient.  Obesity Estimated body  mass index is 30.23 kg/m as calculated from the following:   Height as of this encounter: 5' 4.25" (1.632 m).   Weight as of this encounter: 80.5 kg.  Patient is stable.  Waiting on hospital bed to be delivered to her house.  Okay for discharge.   PERTINENT LABS:  The results of significant diagnostics from this hospitalization (including imaging, microbiology, ancillary and laboratory) are listed below for reference.     Labs:   Basic Metabolic Panel: Recent Labs  Lab 10/26/22 0500 10/27/22 0510 10/28/22 0500 10/29/22 0533 10/29/22 0830 10/30/22 1759 10/31/22 0646  NA 143 143 142  --  144 142 145  K 4.5 4.1 4.3  --  4.3 4.3 4.4  CL 118* 118* 118*  --  120* 120* 120*  CO2 16* 16* 17*  --  16* 16* 18*  GLUCOSE 134* 114* 80  --  118* 91 87  BUN 54* 53* 50*  --  50* 47* 42*  CREATININE 5.68* 5.48* 5.57*  --  4.88* 4.64* 4.48*  CALCIUM 9.4 9.4 9.5  --  9.4 9.4 9.8  MG 1.9 1.9 1.9 1.7  --  1.6*  --    Liver Function Tests: Recent Labs  Lab 10/25/22 0500 10/26/22 0500 10/27/22 0510 10/28/22 0500  AST _0 ALT _1 ALKPHOS 51 58 57 59  BILITOT 0.7 0.5 0.6 0.6  PROT 5.4* 5.7* 5.8* 5.6*  ALBUMIN 2.5* 2.6* 2.6* 2.7*    CBC: Recent Labs  Lab 10/25/22 0500 10/26/22 0500 10/27/22 0510 10/28/22 0500 10/29/22 0533 10/31/22 0646  WBC 6.9 11.3* 6.1 6.6 6.3 4.6  NEUTROABS 4.9 8.1* 3.2 3.0  --   --   HGB 7.7* 8.3* 8.3* 9.8* 11.4* 10.5*  HCT 24.8* 26.0* 26.0* 30.4* 35.0* 32.6*  MCV 89.2 88.1 87.2 88.1 86.8 89.3  PLT 151 154 133* 124* 97* 87*    IMAGING STUDIES DG HIP UNILAT WITH PELVIS 2-3 VIEWS LEFT  Result Date: 10/22/2022 CLINICAL DATA:  Left groin pain.  History of multiple myeloma. EXAM: DG HIP (WITH OR WITHOUT PELVIS) 2-3V LEFT COMPARISON:  CT abdomen 10/19/2022, CT abdomen pelvis 07/18/2022 FINDINGS: Compared to 07/18/2022 CT, there is a new lytic lesion with destruction of the left lesser trochanter. The tip of the left lesser trochanter is  superiorly displaced approximately 1.8 cm. This is suspicious for a bone metastasis and pathologic avulsion. Multiple additional bilateral proximal femoral lucencies are again seen consistent with lytic metastases. The largest is seen within the right femoral neck measuring up to approximately 3.8 cm in diameter, similar to 07/18/2022 CT. Multiple lytic lesions are again seen throughout the bilateral superior and inferior pubic rami and bilateral iliac bones, although better visualized on prior CT. On prior 07/18/2022 CT, the largest was with in the left ilium, and there is diffuse lucency within the superior left ilium corresponding to this lesion. IMPRESSION: 1. Compared to 07/18/2022 CT, there is a new left lesser trochanter lytic lesion consistent with a bone metastasis and pathologic displaced avulsion fracture. 2. Multiple additional lytic lesions are again seen throughout the bilateral superior and inferior pubic rami and bilateral iliac bones, as on prior CT and consistent with reported history of multiple myeloma. Electronically Signed  By: Yvonne Kendall M.D.   On: 10/22/2022 11:16   CT Abd Limited W/O Cm  Result Date: 10/19/2022 CLINICAL DATA:  Acute kidney failure. Suboptimal renal ultrasound due to immobility and bowel-gas EXAM: CT ABDOMEN WITHOUT CONTRAST LIMITED TECHNIQUE: Multidetector CT imaging of the abdomen was performed following the standard protocol without IV contrast. RADIATION DOSE REDUCTION: This exam was performed according to the departmental dose-optimization program which includes automated exposure control, adjustment of the mA and/or kV according to patient size and/or use of iterative reconstruction technique. COMPARISON:  Ultrasound earlier today and CT abdomen and pelvis 07/18/2022 FINDINGS: Lower chest: Moderate right and small left pleural effusions. Fluid tracks into the right major fissure. Associated atelectasis/infiltrates. Partially visualized CVC. Coronary artery  calcification. Hepatobiliary: Respiratory motion obscures portions of the upper abdomen. Unremarkable noncontrast appearance of the liver. No biliary dilation. No cholelithiasis. Pancreas: Unremarkable. No pancreatic ductal dilatation or surrounding inflammatory changes. Spleen: Normal in size without focal abnormality. Adrenals/Urinary Tract: Adrenal glands are unremarkable. Kidneys are normal, without renal calculi, focal lesion, or hydronephrosis. Stomach/Bowel: Normal caliber large and small bowel were visualized. Moderate stool burden in the visualized portions of the ascending and transverse colon. Vascular/Lymphatic: Aortic atherosclerosis. No enlarged abdominal or pelvic lymph nodes. Other: No free intraperitoneal air. Musculoskeletal: Expansile lytic lesion and soft tissue mass in the anterior right sixth rib. The mass measures 6.2 by 8.3 cm and causes mass effect on the right hepatic lobe. Additional partially visualized expansile lytic mass in the left iliac wing. Multiple bilateral subacute/chronic rib fractures and depressed sternal fracture. Predominantly lytic lesions throughout the thoracolumbar spine with multiple vertebral body compression deformities similar to 07/18/2022. IMPRESSION: Unremarkable noncontrast appearance of the kidneys. No hydronephrosis or visualized obstructing urinary calculi. Progression of diffuse osseous lytic lesions in the axial and appendicular skeleton compatible with multiple myeloma. This includes increased size of the expansile lytic lesion with soft tissue mass in the anterior right sixth rib and left iliac wing. Multiple pathologic vertebral body compression fractures are similar to 07/18/2022. Moderate colonic stool burden. Moderate right and small left pleural effusions. Electronically Signed   By: Placido Sou M.D.   On: 10/19/2022 20:01   US RENAL  Result Date: 10/19/2022 CLINICAL DATA:  AKI. Per technologist note, suboptimal images due to patient  breathing an overlying bowel gas. EXAM: RENAL / URINARY TRACT ULTRASOUND COMPLETE COMPARISON:  CT abdomen and pelvis July 18, 2022 FINDINGS: Right Kidney: Renal measurements: 8.6 x 3.9 x 4.8 cm = volume: 84.6 mL. Echogenicity within normal limits. No mass or hydronephrosis visualized. Left Kidney: Renal measurements: 9.9 x 6.1 x 5.0 cm = volume: 158.5 mL. Echogenicity within normal limits. No mass or hydronephrosis visualized. Bladder: Decompressed with Foley catheter in place. Other: None. IMPRESSION: No acute findings. Electronically Signed   By: Beryle Flock M.D.   On: 10/19/2022 17:26   DG Humerus Left  Result Date: 10/17/2022 CLINICAL DATA:  History of multiple myeloma. Right arm pain after injury. EXAM: LEFT HUMERUS - 2+ VIEW COMPARISON:  None Available. FINDINGS: Mildly displaced fracture is seen involving the proximal left humeral shaft with irregular lucency in the area most consistent with pathologic fracture. IMPRESSION: Mildly displaced pathologic fracture is seen involving the midshaft of the left humerus. Electronically Signed   By: Marijo Conception M.D.   On: 10/17/2022 13:25   DG Chest 2 View  Result Date: 10/17/2022 CLINICAL DATA:  Pain over left posterior chest wall. Multiple myeloma. EXAM: CHEST - 2 VIEW COMPARISON:  Chest  10/02/2022 FINDINGS: Heart size normal.  Negative for heart failure or edema. Interval removal of right basilar chest tube since the prior study. No pneumothorax. Right pleural effusion is small but has progressed from the prior study. Pleural based density right upper lobe appears slightly larger and may be pleural fluid or mass lesion. Progressive right lower lobe airspace disease. Myeloma lesions in the ribs on the right are better seen on prior CT. Port-A-Cath tip in the lower SVC unchanged. IMPRESSION: 1. Interval removal of right basilar chest tube. No pneumothorax. 2. Right pleural effusion has progressed. Pleural based density right upper lobe appears  slightly larger. 3. Progressive right lower lobe airspace disease. Electronically Signed   By: Franchot Gallo M.D.   On: 10/17/2022 10:32   DG CHEST PORT 1 VIEW  Result Date: 10/02/2022 CLINICAL DATA:  [id number] with right pleural effusion and chest tube in place. EXAM: PORTABLE CHEST 1 VIEW COMPARISON:  Portable chest yesterday at 5:26 p.m. FINDINGS: 4:41 a.m. Left chest port with IJ approach catheter terminating in the right atrium. Pigtail pleural tube in the lower lateral right chest is again noted with decreased right pleural fluid in the interval, small layering right pleural effusion remains and a possible loculated small collection in the lateral apical right chest. There is elevated right hemidiaphragm and perihilar atelectatic bands. The right base is obscured by the elevated hemidiaphragm. Right basilar airspace disease could be hidden. The remaining visualized lungs are clear. The cardiac size is normal. There is aortic tortuosity and calcification, stable mediastinum. No acute osseous findings. IMPRESSION: 1. Decreased right pleural fluid in the interval with a small layering right pleural effusion remaining. Possible loculated collection in the lateral apical right chest. Right basilar airspace disease could be hidden due to low inspiration and elevated right diaphragm. 2. Elevated right hemidiaphragm with perihilar atelectatic bands. Electronically Signed   By: Telford Nab M.D.   On: 10/02/2022 06:51   DG CHEST PORT 1 VIEW  Result Date: 10/01/2022 CLINICAL DATA:  Chest 2 EXAM: PORTABLE CHEST 1 VIEW COMPARISON:  10/01/2022, CT 06/19/2022 FINDINGS: Left-sided central venous port tip over the right atrial region. Hypoventilatory changes. Interim placement of right-sided chest tube with pigtail at the right lower lateral chest. Residual right pleural effusion with possible loculation at the peripheral right upper lobe. No visible pneumothorax. Airspace disease at the right lung base. Probable  background mild pulmonary edema. IMPRESSION: 1. Interim placement of right-sided chest tube with residual right pleural effusion and possible loculation at the peripheral right upper lobe. No visible pneumothorax. 2. Airspace disease at the right lung base persists. 3. Suspect mild interstitial edema Electronically Signed   By: Donavan Foil M.D.   On: 10/01/2022 18:18    DISCHARGE EXAMINATION: Vitals:   10/30/22 0515 10/30/22 1358 10/30/22 2040 10/31/22 0505  BP: 137/85 132/82 (!) 142/87 (!) 140/77  Pulse: 94 82 96 77  Resp: _0 Temp: 98.2 F (36.8 C) 97.9 F (36.6 C) 98.4 F (36.9 C) 97.9 F (36.6 C)  TempSrc: Oral Oral Oral Oral  SpO2: 100% 100% 100% 99%  Weight: 80.5 kg     Height:       General appearance: Awake alert.  In no distress Resp: Clear to auscultation bilaterally.  Normal effort Cardio: S1-S2 is normal regular.  No S3-S4.  No rubs murmurs or bruit GI: Abdomen is soft.  Nontender nondistended.  Bowel sounds are present normal.  No masses organomegaly   DISPOSITION: Home  Discharge Instructions  Infusion Appointment Request (150 Min)   Complete by: Oct 25, 2022    Contact your oncology clinic or infusion center to schedule this appointment.   Lab Appointment Request   Complete by: Oct 25, 2022    Contact your oncology clinic or infusion center to schedule this appointment.   Call MD for:  extreme fatigue   Complete by: As directed    Call MD for:  persistant dizziness or light-headedness   Complete by: As directed    Call MD for:  persistant nausea and vomiting   Complete by: As directed    Call MD for:  severe uncontrolled pain   Complete by: As directed    Call MD for:  temperature >100.4   Complete by: As directed    Diet general   Complete by: As directed    Discharge instructions   Complete by: As directed    Please take your medications as prescribed.  Please be sure to call Dr. Antonieta Pert office if there are any concerns or  questions.  You were cared for by a hospitalist during your hospital stay. If you have any questions about your discharge medications or the care you received while you were in the hospital after you are discharged, you can call the unit and asked to speak with the hospitalist on call if the hospitalist that took care of you is not available. Once you are discharged, your primary care physician will handle any further medical issues. Please note that NO REFILLS for any discharge medications will be authorized once you are discharged, as it is imperative that you return to your primary care physician (or establish a relationship with a primary care physician if you do not have one) for your aftercare needs so that they can reassess your need for medications and monitor your lab values. If you do not have a primary care physician, you can call [phone] for a physician referral.   Increase activity slowly   Complete by: As directed          Allergies as of 10/31/2022       Reactions   Penicillins Other (See Comments)   Severe Yeast infection... No deathly reactions   Codeine Nausea Only   Morphine Other (See Comments)   headache   Tramadol Other (See Comments)   Headaches        Medication List     STOP taking these medications    OLANZapine 5 MG tablet Commonly known as: ZyPREXA       TAKE these medications    diazepam 5 MG tablet Commonly known as: VALIUM Take thirty minutes before MRI for anxiety. What changed:  how much to take how to take this when to take this reasons to take this additional instructions   famciclovir 500 MG tablet Commonly known as: FAMVIR Take 1 tablet (500 mg total) by mouth daily.   fluconazole 100 MG tablet Commonly known as: DIFLUCAN Take 100 mg by mouth daily.   lactulose 10 GM/15ML solution Commonly known as: CHRONULAC TAKE 15 MLS (10 G TOTAL) BY MOUTH 2 (TWO) TIMES DAILY.   levothyroxine 88 MCG tablet Commonly known as:  SYNTHROID Take 88 mcg by mouth daily before breakfast.   LORazepam 0.5 MG tablet Commonly known as: ATIVAN Take 1 tablet (0.5 mg total) by mouth 2 (two) times daily.   megestrol 400 MG/10ML suspension Commonly known as: MEGACE Take 10 mLs (400 mg total) by mouth 2 (two) times daily.   melatonin 3  MG Tabs tablet Take 1 tablet (3 mg total) by mouth at bedtime as needed.   metoprolol tartrate 25 MG tablet Commonly known as: LOPRESSOR Take 0.5 tablets (12.5 mg total) by mouth 2 (two) times daily. What changed: how much to take   multivitamin with minerals Tabs tablet Take 1 tablet by mouth daily.   ondansetron 8 MG tablet Commonly known as: Zofran Take 1 tablet (8 mg total) by mouth every 8 (eight) hours as needed for nausea or vomiting.   Oxycodone HCl 10 MG Tabs Take 0.5-1 tablets (5-10 mg total) by mouth every 6 (six) hours as needed (Mod to severe pain). What changed:  how much to take when to take this reasons to take this   pantoprazole 40 MG tablet Commonly known as: PROTONIX Take 1 tablet (40 mg total) by mouth 2 (two) times daily.   prochlorperazine 10 MG tablet Commonly known as: COMPAZINE Take 1 tablet (10 mg total) by mouth every 6 (six) hours as needed for nausea or vomiting.   rosuvastatin 10 MG tablet Commonly known as: CRESTOR Take 10 mg by mouth every evening.   senna 8.6 MG Tabs tablet Commonly known as: SENOKOT Take 1 tablet (8.6 mg total) by mouth daily as needed for mild constipation or moderate constipation.               Durable Medical Equipment  (From admission, onward)           Start     Ordered   10/24/22 1248  For home use only DME Bedside commode  Once       Question:  Patient needs a bedside commode to treat with the following condition  Answer:  Weakness   10/24/22 1248   10/24/22 1156  For home use only DME Hospital bed  Once       Comments: Therapeutic mattress  Question Answer Comment  Length of Need 12 Months    Patient has (list medical condition): multiple myeloma, weakness   The above medical condition requires: Patient requires the ability to reposition frequently   Head must be elevated greater than: 30 degrees   Bed type Semi-electric   Hoyer Lift Yes      10/24/22 1203   10/23/22 1419  For home use only DME standard manual wheelchair with seat cushion  Once       Comments: Patient suffers from generalized weakness and multiple myeloma which impairs their ability to perform daily activities like bathing, dressing, feeding, grooming, and toileting in the home.  A cane, crutch, or walker will not resolve issue with performing activities of daily living. A wheelchair will allow patient to safely perform daily activities. Patient can safely propel the wheelchair in the home or has a caregiver who can provide assistance. Length of need Lifetime. Accessories: elevating leg rests (ELRs), wheel locks, extensions and anti-tippers.   10/23/22 1421   10/20/22 1019  For home use only DME standard manual wheelchair with seat cushion  Once       Comments: Patient suffers from multiple myeloma and left humeral fracture which impairs their ability to perform daily activities like bathing, dressing, and grooming in the home.  A cane will not resolve issue with performing activities of daily living. A wheelchair will allow patient to safely perform daily activities. Patient can safely propel the wheelchair in the home or has a caregiver who can provide assistance. Length of need Lifetime. Accessories: elevating leg rests (ELRs), wheel locks, extensions and anti-tippers.  10/20/22 1021              Follow-up Information     [facility] Follow up.   Specialty: Home Health Services Why: Your home health has been set up with [facility]. the office will call you with start of service information. If you have any questions or concerns please call the number listed above. Contact information: [address] [place] [address] [phone]         Leeroy Cha, MD Follow up in 1 week(s).   Specialty: Internal Medicine Contact information: [address] [place] [address] [phone]         Volanda Napoleon, MD Follow up.   Specialty: Oncology Contact information: [address] [place] [address] [phone]                 TOTAL DISCHARGE TIME: 20 minutes  [facility] Pager on [email or web address]  10/31/2022, 12:43 PM

## 2022-10-31 NOTE — Progress Notes (Signed)
Cycle 4 day 15 deleted per Dr. Antonieta Pert instructions. Patient discharged today. She will be treated on 11/08/22 with Cycle 5, day 1.

## 2022-10-31 NOTE — TOC Progression Note (Addendum)
Transition of Care [facility]) - Progression Note    Patient Details  Name: Michelle [name] MRN: [id number] Date of Birth: 20-May-1961  Transition of Care [facility]) CM/SW Contact  Leeroy Cha, RN Phone Number: 10/31/2022, 10:10 AM  Clinical Narrative:    1010-tct-jermaine with [employer]-hoyer lift is ready to be delivered when patient dcd. 1012-tct-katilyn-bed should be delivered around 1230 pm today.  Will call when it is. 1345/tcf-katilyn-bed is set up and ready for the patient to come home.  Md notified.  Expected Discharge Plan: [place] Barriers to Discharge: Continued Medical Work up  Expected Discharge Plan and Services In-house Referral: NA Discharge Planning Services: CM Consult Post Acute Care Choice: Home Health, Durable Medical Equipment Living arrangements for the past 2 months: Single Family Home Expected Discharge Date: 10/31/22               DME Arranged: Wheelchair manual, Other see comment Harrel Lemon lift) DME Agency: [employer] Date DME Agency Contacted: 10/23/22 Time DME Agency Contacted: [id number] Representative spoke with at DME Agency: Melene Muller HH Arranged: PT, OT [employer] Agency: [place] Date Miguel Barrera: 10/23/22 Time [place]: [id number] Representative spoke with at [place]: [place] Determinants of Health ([place]) Interventions [place]: No Food Insecurity (10/18/2022)  Housing: Low Risk  (10/18/2022)  Transportation Needs: No Transportation Needs (10/18/2022)  Utilities: Not At Risk (10/18/2022)  Tobacco Use: Medium Risk (10/22/2022)    Readmission Risk Interventions   Row Labels 10/23/2022    1:47 PM 10/03/2022    8:34 AM  Readmission Risk Prevention Plan   Section Header. No data exists in this row.    Transportation Screening   Complete Complete  PCP or Specialist Appt within 3-5 Days    Complete  HRI or [place]    Complete  Social Work Consult for  [place] Planning/Counseling    Complete  Palliative Care Screening    Not Applicable  Medication Review Press photographer)   Complete Complete  PCP or Specialist appointment within 3-5 days of discharge   Complete   HRI or [place]   Complete   SW Recovery Care/Counseling Consult   Complete   Palliative Care Screening   Not [place]   Not Applicable

## 2022-10-31 NOTE — Progress Notes (Signed)
Daily Progress Note   Patient Name: Michelle [name]       Date: 10/31/2022 DOB: Sep 17, 1961  Age: 61 y.o. MRN#: [id number] Attending Physician: Bonnielee Haff, MD Primary Care Physician: Leeroy Cha, MD Admit Date: 10/17/2022  Reason for Consultation/Follow-up: Establishing goals of care  Subjective: Awake alert oriented, resting in bed, daughter at bedside, anticipating discharge today  Length of Stay: 14  Current Medications: Scheduled Meds:   sodium chloride   Intravenous Once   Chlorhexidine Gluconate Cloth  6 each Topical QHS   famotidine  40 mg Oral Daily   heparin injection (subcutaneous)  5,000 Units Subcutaneous Q8H   lactulose  20 g Oral BID   levothyroxine  88 mcg Oral QAC breakfast   LORazepam  0.5 mg Oral BID   megestrol  400 mg Oral BID   metoprolol tartrate  12.5 mg Oral BID   rosuvastatin  10 mg Oral Daily   senna-docusate  2 tablet Oral BID    Continuous Infusions:  ondansetron 8 mg (10/31/22 0625)    PRN Meds: acetaminophen, guaiFENesin, hydrALAZINE, influenza vac split quadrivalent PF, ipratropium-albuterol, lip balm, melatonin, metoprolol tartrate, pneumococcal 23 valent vaccine, prochlorperazine, senna-docusate, traZODone  Physical Exam         Left arm is in a sling Regular work of breathing No edema  Vital Signs: BP (!) 140/77 (BP Location: Right Arm)   Pulse 77   Temp 97.9 F (36.6 C) (Oral)   Resp 16   Ht 5' 4.25" (1.632 m)   Wt 80.5 kg   SpO2 99%   BMI 30.23 kg/m  SpO2: SpO2: 99 % O2 Device: O2 Device: Room Air O2 Flow Rate: O2 Flow Rate (L/min): 0 L/min  Intake/output summary:  Intake/Output Summary (Last 24 hours) at 10/31/2022 1208 Last data filed at 10/31/2022 6314 Gross per 24 hour  Intake 898.89 ml  Output 700 ml   Net 198.89 ml   LBM: Last BM Date : 10/28/22 Baseline Weight: Weight: 74.8 kg Most recent weight: Weight: 80.5 kg       Palliative Assessment/Data:      Patient Active Problem List   Diagnosis Date Noted   Palliative care by specialist 10/21/2022   Preoperative cardiovascular examination 10/21/2022   Malignant pleural effusion 10/01/2022   Stage 3a chronic kidney disease (CKD) ([place]) 10/01/2022  GERD (gastroesophageal reflux disease) 10/01/2022   Anxiety 10/01/2022   Anemia of chronic renal failure, stage 3b ([place]) 08/02/2022   Pleural effusion 06/25/2022   Multiple myeloma without remission ([place]) 06/20/2022   Intractable pain 06/14/2022   Hypercalcemia 06/14/2022   AKI (acute kidney injury) ([place]) 63/33/5456   Metabolic acidosis 25/63/8937   Tachycardia 06/14/2022   Metastasis to bone ([place]) 06/10/2022   Refractory anemia ([place]) 05/17/2022   Hyperlipidemia 02/16/2017   Morbid obesity ([place]) 02/16/2017   Essential hypertension 02/16/2017   Hypothyroidism 02/16/2017    Palliative Care Assessment & Plan   Patient Profile:   Assessment: 61 year old lady seen in palliative consultation earlier in this hospitalization for CODE STATUS and goals of care discussions.  Chaplain consult was done and advanced directive paperwork was completed.  Patient has pathologic fracture left humerus, stage IIIb chronic kidney disease multiple myeloma and malignant pleural effusion status post talc pleurodesis.  She has also received radiation therapy to the base of her skull.  Patient was given inpatient chemotherapy.  Patient has extensive spinal disease.  Patient was seen and evaluated by orthopedics and also has bilateral proximal femoral metastatic lesions.  Recommendations/Plan: Patient is being discharged home today with home health.  Could consider palliative care at [place] if needed for symptom management or goals of care discussions.  Goals of Care and Additional  Recommendations: Limitations on Scope of Treatment: Full Scope Treatment  Code Status:    Code Status Orders  (From admission, onward)           Start     Ordered   10/17/22 2110  Full code  Continuous       Question Answer Comment  By: Other   Comments: Full code      10/17/22 2109           Code Status History     Date Active Date Inactive Code Status Order ID Comments User Context   10/01/2022 1130 10/03/2022 2139 Full Code [id number]  Jonnie Finner, DO Inpatient   06/14/2022 2137 06/26/2022 2108 Full Code [id number]  Orene Desanctis, DO Inpatient      Advance Directive Documentation    Flowsheet Row Most Recent Value  Type of Advance Directive Healthcare Power of Attorney  [FILED [place]-- not in VYNCA]  Pre-existing out of facility DNR order (yellow form or pink MOST form) --  "MOST" Form in Place? --       Prognosis:  Unable to determine  Discharge Planning: Home with [place] was discussed with aunt and family present at bedside  Thank you for allowing the Palliative Medicine Team to assist in the care of this patient. Low MDM    Greater than 50%  of this time was spent counseling and coordinating care related to the above assessment and plan.  Loistine Chance, MD  Please contact Palliative Medicine Team phone at [phone] for questions and concerns.

## 2022-10-31 NOTE — TOC Transition Note (Addendum)
Transition of Care [facility]) - CM/SW Discharge Note   Patient Details  Name: Michelle [name] MRN: [id number] Date of Birth: 12/17/1960  Transition of Care [facility]) CM/SW Contact:  Leeroy Cha, RN Phone Number: 10/31/2022, 1:55 PM   Clinical Narrative:    Pt dcd to return home with hhc through [employer] and dme through [employer]. [employer] notified of dc to deliver the hoyer lift. [facility] called for transport at 1350.   Barriers to Discharge: Barriers Resolved   Patient Goals and CMS Choice CMS Medicare.gov Compare Post Acute Care list provided to:: Patient Choice offered to / list presented to : Patient  Discharge Placement                         Discharge Plan and Services Additional resources added to the After Visit Summary for   In-house Referral: NA Discharge Planning Services: CM Consult Post Acute Care Choice: Home Health, Durable Medical Equipment          DME Arranged: Wheelchair manual, Other see comment Harrel Lemon lift) DME Agency: [employer] Date DME Agency Contacted: 10/23/22 Time DME Agency Contacted: [phone] Representative spoke with at DME Agency: Melene Muller HH Arranged: PT, OT [place] Agency: [place] Date [place]: 10/23/22 Time [place]: 1448 Representative spoke with at [place]: [place] Determinants of Health ([place]) Interventions [place]: No Food Insecurity (10/18/2022)  Housing: Low Risk  (10/18/2022)  Transportation Needs: No Transportation Needs (10/18/2022)  Utilities: Not At Risk (10/18/2022)  Tobacco Use: Medium Risk (10/22/2022)     Readmission Risk Interventions   Row Labels 10/23/2022    1:47 PM 10/03/2022    8:34 AM  Readmission Risk Prevention Plan   Section Header. No data exists in this row.    Transportation Screening   Complete Complete  PCP or Specialist Appt within 3-5 Days    Complete  HRI or [place]    Complete  Social Work Consult for  [place] Planning/Counseling    Complete  Palliative Care Screening    Not Applicable  Medication Review Press photographer)   Complete Complete  PCP or Specialist appointment within 3-5 days of discharge   Complete   HRI or [place]   Complete   SW Recovery Care/Counseling Consult   Complete   Palliative Care Screening   Not [place]   Not Applicable

## 2022-11-01 ENCOUNTER — Ambulatory Visit: Payer: BC Managed Care – PPO | Admitting: Hematology & Oncology

## 2022-11-01 ENCOUNTER — Inpatient Hospital Stay: Payer: BC Managed Care – PPO

## 2022-11-01 ENCOUNTER — Other Ambulatory Visit: Payer: BC Managed Care – PPO

## 2022-11-01 ENCOUNTER — Ambulatory Visit: Payer: BC Managed Care – PPO

## 2022-11-05 ENCOUNTER — Other Ambulatory Visit: Payer: Self-pay | Admitting: *Deleted

## 2022-11-05 DIAGNOSIS — M5440 Lumbago with sciatica, unspecified side: Secondary | ICD-10-CM

## 2022-11-05 DIAGNOSIS — C7951 Secondary malignant neoplasm of bone: Secondary | ICD-10-CM

## 2022-11-05 DIAGNOSIS — F411 Generalized anxiety disorder: Secondary | ICD-10-CM

## 2022-11-05 DIAGNOSIS — N1832 Chronic kidney disease, stage 3b: Secondary | ICD-10-CM

## 2022-11-05 DIAGNOSIS — D631 Anemia in chronic kidney disease: Secondary | ICD-10-CM

## 2022-11-05 DIAGNOSIS — C9000 Multiple myeloma not having achieved remission: Secondary | ICD-10-CM

## 2022-11-05 DIAGNOSIS — Z95828 Presence of other vascular implants and grafts: Secondary | ICD-10-CM

## 2022-11-05 NOTE — Progress Notes (Signed)
Call received from patient's daughter Michelle [name] that [facility] has not reached out to pt since discharge last week on 10/31/22. Call placed to [facility] and they stated that they did not receive a referral from [place].  Referral faxed to [phone].  Call placed to [place] to check on delivery of equipment for pt and they stated that they are waiting on insurance authorization and will call pt today. Call placed back to Michelle [name] to notify her of above.  Michelle [name] is appreciative of call and has no questions at this time.

## 2022-11-06 ENCOUNTER — Telehealth: Payer: BC Managed Care – PPO | Admitting: Primary Care

## 2022-11-06 ENCOUNTER — Telehealth: Payer: Self-pay

## 2022-11-06 NOTE — Telephone Encounter (Signed)
PT was called PT did not get Xray prior to visit today.PT has had no SOB or any breathing issues today.PT was advised appt would be rescheduled and could come in 2 week to see Dr.Icard with a updated Xray.PT's daughters and PT's voice was understanding.Nothing  else further needed.

## 2022-11-07 ENCOUNTER — Other Ambulatory Visit: Payer: Self-pay

## 2022-11-08 ENCOUNTER — Inpatient Hospital Stay: Payer: BC Managed Care – PPO

## 2022-11-08 ENCOUNTER — Inpatient Hospital Stay: Payer: BC Managed Care – PPO | Attending: Hematology & Oncology

## 2022-11-08 ENCOUNTER — Inpatient Hospital Stay (HOSPITAL_BASED_OUTPATIENT_CLINIC_OR_DEPARTMENT_OTHER): Payer: BC Managed Care – PPO | Admitting: Hematology & Oncology

## 2022-11-08 ENCOUNTER — Other Ambulatory Visit: Payer: Self-pay

## 2022-11-08 ENCOUNTER — Encounter: Payer: Self-pay | Admitting: *Deleted

## 2022-11-08 ENCOUNTER — Encounter: Payer: Self-pay | Admitting: Hematology & Oncology

## 2022-11-08 ENCOUNTER — Telehealth: Payer: Self-pay

## 2022-11-08 VITALS — BP 125/77 | HR 130 | Temp 98.4°F | Resp 19 | Ht 64.0 in

## 2022-11-08 DIAGNOSIS — C9000 Multiple myeloma not having achieved remission: Secondary | ICD-10-CM | POA: Diagnosis not present

## 2022-11-08 DIAGNOSIS — N1832 Chronic kidney disease, stage 3b: Secondary | ICD-10-CM | POA: Diagnosis not present

## 2022-11-08 DIAGNOSIS — D631 Anemia in chronic kidney disease: Secondary | ICD-10-CM | POA: Diagnosis not present

## 2022-11-08 DIAGNOSIS — Z5111 Encounter for antineoplastic chemotherapy: Secondary | ICD-10-CM | POA: Diagnosis present

## 2022-11-08 DIAGNOSIS — Z5112 Encounter for antineoplastic immunotherapy: Secondary | ICD-10-CM | POA: Insufficient documentation

## 2022-11-08 DIAGNOSIS — C7951 Secondary malignant neoplasm of bone: Secondary | ICD-10-CM

## 2022-11-08 DIAGNOSIS — Z95828 Presence of other vascular implants and grafts: Secondary | ICD-10-CM

## 2022-11-08 LAB — CBC WITH DIFFERENTIAL (CANCER CENTER ONLY)
Abs Immature Granulocytes: 0.02 10*3/uL (ref 0.00–0.07)
Basophils Absolute: 0 10*3/uL (ref 0.0–0.1)
Basophils Relative: 0 %
Eosinophils Absolute: 0 10*3/uL (ref 0.0–0.5)
Eosinophils Relative: 1 %
HCT: 37 % (ref 36.0–46.0)
Hemoglobin: 12.1 g/dL (ref 12.0–15.0)
Immature Granulocytes: 0 %
Lymphocytes Relative: 19 %
Lymphs Abs: 1.1 10*3/uL (ref 0.7–4.0)
MCH: 28.9 pg (ref 26.0–34.0)
MCHC: 32.7 g/dL (ref 30.0–36.0)
MCV: 88.3 fL (ref 80.0–100.0)
Monocytes Absolute: 2.6 10*3/uL — ABNORMAL HIGH (ref 0.1–1.0)
Monocytes Relative: 44 %
Neutro Abs: 2.1 10*3/uL (ref 1.7–7.7)
Neutrophils Relative %: 36 %
Platelet Count: 201 10*3/uL (ref 150–400)
RBC: 4.19 MIL/uL (ref 3.87–5.11)
RDW: 20.6 % — ABNORMAL HIGH (ref 11.5–15.5)
WBC Count: 5.8 10*3/uL (ref 4.0–10.5)
nRBC: 0 % (ref 0.0–0.2)

## 2022-11-08 LAB — CMP (CANCER CENTER ONLY)
ALT: 9 U/L (ref 0–44)
AST: 24 U/L (ref 15–41)
Albumin: 3.6 g/dL (ref 3.5–5.0)
Alkaline Phosphatase: 102 U/L (ref 38–126)
Anion gap: 15 (ref 5–15)
BUN: 30 mg/dL — ABNORMAL HIGH (ref 8–23)
CO2: 20 mmol/L — ABNORMAL LOW (ref 22–32)
Calcium: 10 mg/dL (ref 8.9–10.3)
Chloride: 106 mmol/L (ref 98–111)
Creatinine: 4.31 mg/dL (ref 0.44–1.00)
GFR, Estimated: 11 mL/min — ABNORMAL LOW (ref >60)
Glucose, Bld: 112 mg/dL — ABNORMAL HIGH (ref 70–99)
Potassium: 3.9 mmol/L (ref 3.5–5.1)
Sodium: 141 mmol/L (ref 135–145)
Total Bilirubin: 0.9 mg/dL (ref 0.3–1.2)
Total Protein: 6.3 g/dL — ABNORMAL LOW (ref 6.5–8.1)

## 2022-11-08 LAB — SAMPLE TO BLOOD BANK

## 2022-11-08 LAB — LACTATE DEHYDROGENASE: LDH: 420 U/L — ABNORMAL HIGH (ref 98–192)

## 2022-11-08 MED ORDER — DARATUMUMAB-HYALURONIDASE-FIHJ 1800-30000 MG-UT/15ML ~~LOC~~ SOLN
1800.0000 mg | Freq: Once | SUBCUTANEOUS | Status: AC
  Administered 2022-11-08: 1800 mg via SUBCUTANEOUS
  Filled 2022-11-08: qty 15

## 2022-11-08 MED ORDER — DIPHENHYDRAMINE HCL 25 MG PO CAPS
50.0000 mg | ORAL_CAPSULE | Freq: Once | ORAL | Status: AC
  Administered 2022-11-08: 50 mg via ORAL
  Filled 2022-11-08: qty 2

## 2022-11-08 MED ORDER — HEPARIN SOD (PORK) LOCK FLUSH 100 UNIT/ML IV SOLN
500.0000 [IU] | Freq: Once | INTRAVENOUS | Status: AC
  Administered 2022-11-08: 500 [IU] via INTRAVENOUS

## 2022-11-08 MED ORDER — DEXTROSE 5 % IV SOLN
110.0000 mg | Freq: Once | INTRAVENOUS | Status: AC
  Administered 2022-11-08: 110 mg via INTRAVENOUS
  Filled 2022-11-08: qty 55

## 2022-11-08 MED ORDER — SODIUM CHLORIDE 0.9% FLUSH
10.0000 mL | Freq: Once | INTRAVENOUS | Status: AC
  Administered 2022-11-08: 10 mL via INTRAVENOUS

## 2022-11-08 MED ORDER — SODIUM CHLORIDE 0.9 % IV SOLN
400.0000 mg | Freq: Once | INTRAVENOUS | Status: AC
  Administered 2022-11-08: 400 mg via INTRAVENOUS
  Filled 2022-11-08: qty 20

## 2022-11-08 MED ORDER — SODIUM CHLORIDE 0.9 % IV SOLN: Freq: Once | INTRAVENOUS | Status: AC

## 2022-11-08 MED ORDER — SODIUM CHLORIDE 0.9 % IV SOLN: Freq: Once | INTRAVENOUS | Status: DC

## 2022-11-08 MED ORDER — SODIUM CHLORIDE 0.9 % IV SOLN
20.0000 mg | Freq: Once | INTRAVENOUS | Status: AC
  Administered 2022-11-08: 20 mg via INTRAVENOUS
  Filled 2022-11-08: qty 20

## 2022-11-08 MED ORDER — ACETAMINOPHEN 325 MG PO TABS
650.0000 mg | ORAL_TABLET | Freq: Once | ORAL | Status: AC
  Administered 2022-11-08: 650 mg via ORAL
  Filled 2022-11-08: qty 2

## 2022-11-08 NOTE — Progress Notes (Signed)
Hematology and Oncology Follow Up Visit  Michelle [name] [id number] 1960/12/24 62 y.o. 11/08/2022   Principle Diagnosis:  IgG Kappa myeloma -- normal cytogenetics -- dup 1p/17p-/t(14:20) Anemia of renal failure -- stage 3b  Current Therapy:   Faspro//Cytoxan/Kyprolis /Decadron -- s/p cycle #2 on 10/11/2022 XRT to lumbar spine -- completed on 06/26/2022 XRT to the skull base --started on 10/07/2022 Xgeva 120 mg sq q 3 months - next dose in 01/2023 Aranesp 300 mcg sq q month for Hgb < 11     Interim History:  Michelle [name] is back for follow-up.  She [facility].  She unfortunately had a pathologic fracture in her left humerus.  She also had fractures in her hips bilaterally.  She did not wish to have any surgery for either these areas.  She has a sling with the left arm and had to mobilize this.  We gave her chemotherapy in the hospital.  She got Kyprolis and Cytoxan in the hospital.  She did go home eventually.  Again, she does not want to have surgery.  Thankfully, she does not have recurrence of the pleural effusion.  She clearly has an incredibly active myeloma.  Hopefully, with the change to Kyprolis, we can get this under better control.  Her last light chain studies back in December showed a kappa light chain of 2900 mg/L.  She just is having a hard time at home.  She is not all that active.  She is having hard time getting around because of the difficulties with these fractures and the pain that they cause.  She said that she is eating okay.  She is not having any nausea or vomiting.  I know that she had a hard time with nausea in the past.  However, we did seem to get this under better control.  I just had a long talk with her.  She understands that this is a very difficult problem that we have.  However, she wants to keep trying.  She has a strong faith.  I know that in the hospital, we prayed quite a bit.  Her hemoglobin is doing incredibly well right  now.  Hopefully this is a good sign that treatment is working.  I really want to try to get her to stem cell transplant.  I am not sure that we will ever be able to get her there.  Possibly, we might be able to get her to CAR-T therapy if we can get her status improved.  Of note, she developed renal failure while she was in the hospital.  This is also improving.  Her BUN is 30 creatinine 4.31 which is actually quite good for her.  I am sure that a lot of this is from the light chains in her kidney tubules.  Overall, I would have to say that her performance status for now is probably ECOG 2, at best.  Medications:  Current Outpatient Medications:    famciclovir (FAMVIR) 500 MG tablet, Take 1 tablet (500 mg total) by mouth daily., Disp: 30 tablet, Rfl: 6   fluconazole (DIFLUCAN) 100 MG tablet, Take 100 mg by mouth daily., Disp: , Rfl:    lactulose (CHRONULAC) 10 GM/15ML solution, TAKE 15 MLS (10 G TOTAL) BY MOUTH 2 (TWO) TIMES DAILY., Disp: 1000 mL, Rfl: 4   levothyroxine (SYNTHROID) 88 MCG tablet, Take 88 mcg by mouth daily before breakfast., Disp: , Rfl:    LORazepam (ATIVAN) 0.5 MG tablet, Take 1 tablet (  0.5 mg total) by mouth 2 (two) times daily., Disp: 30 tablet, Rfl: 0   megestrol (MEGACE) 400 MG/10ML suspension, Take 10 mLs (400 mg total) by mouth 2 (two) times daily., Disp: 240 mL, Rfl: 0   melatonin 3 MG TABS tablet, Take 1 tablet (3 mg total) by mouth at bedtime as needed., Disp: 30 tablet, Rfl: 0   metoprolol tartrate (LOPRESSOR) 25 MG tablet, Take 0.5 tablets (12.5 mg total) by mouth 2 (two) times daily., Disp: 60 tablet, Rfl: 0   Multiple Vitamin (MULTIVITAMIN WITH MINERALS) TABS tablet, Take 1 tablet by mouth daily., Disp: , Rfl:    ondansetron (ZOFRAN) 8 MG tablet, Take 1 tablet (8 mg total) by mouth every 8 (eight) hours as needed for nausea or vomiting., Disp: 20 tablet, Rfl: 2   Oxycodone HCl 10 MG TABS, Take 0.5-1 tablets (5-10 mg total) by mouth every 6 (six) hours as needed  (Mod to severe pain)., Disp: 30 tablet, Rfl: 0   pantoprazole (PROTONIX) 40 MG tablet, Take 1 tablet (40 mg total) by mouth 2 (two) times daily., Disp: 60 tablet, Rfl: 6   prochlorperazine (COMPAZINE) 10 MG tablet, Take 1 tablet (10 mg total) by mouth every 6 (six) hours as needed for nausea or vomiting., Disp: 30 tablet, Rfl: 5   rosuvastatin (CRESTOR) 10 MG tablet, Take 10 mg by mouth every evening., Disp: , Rfl:    senna (SENOKOT) 8.6 MG TABS tablet, Take 1 tablet (8.6 mg total) by mouth daily as needed for mild constipation or moderate constipation., Disp: 30 tablet, Rfl: 0   diazepam (VALIUM) 5 MG tablet, Take thirty minutes before MRI for anxiety. (Patient not taking: Reported on 11/08/2022), Disp: 1 tablet, Rfl: 0 No current facility-administered medications for this visit.  Facility-Administered Medications Ordered in Other Visits:    0.9 %  sodium chloride infusion, , Intravenous, Once, Demara Lover, Rudell Cobb, MD  Allergies:  Allergies  Allergen Reactions   Penicillins Other (See Comments)    Severe Yeast infection... No deathly reactions   Codeine Nausea Only   Morphine Other (See Comments)    headache   Tramadol Other (See Comments)    Headaches     Past Medical History, Surgical history, Social history, and Family History were reviewed and updated.  Review of Systems: Review of Systems  Constitutional:  Positive for fatigue.  HENT:  Negative.    Eyes: Negative.   Respiratory: Negative.    Cardiovascular:  Positive for leg swelling.  Gastrointestinal: Negative.   Endocrine: Negative.   Genitourinary: Negative.    Musculoskeletal:  Positive for arthralgias, back pain and myalgias.  Skin: Negative.   Neurological: Negative.   Hematological: Negative.   Psychiatric/Behavioral: Negative.      Physical Exam:  height is '5\' 4"'$  (1.626 m). Her oral temperature is 98.4 F (36.9 C). Her blood pressure is 125/77 and her pulse is 130 (abnormal). Her respiration is 19 and oxygen  saturation is 97%.   Wt Readings from Last 3 Encounters:  10/30/22 177 lb 7.5 oz (80.5 kg)  10/17/22 165 lb (74.8 kg)  10/11/22 162 lb (73.5 kg)    Physical Exam Vitals reviewed.  HENT:     Head: Normocephalic and atraumatic.  Eyes:     Pupils: Pupils are equal, round, and reactive to light.  Cardiovascular:     Rate and Rhythm: Normal rate and regular rhythm.     Heart sounds: Normal heart sounds.  Pulmonary:     Effort: Pulmonary effort is normal.  Breath sounds: Normal breath sounds.  Abdominal:     General: Bowel sounds are normal.     Palpations: Abdomen is soft.  Musculoskeletal:        General: No tenderness or deformity. Normal range of motion.     Cervical back: Normal range of motion.     Comments: Her lower extremities does show some swelling bilateral.  She probably has 2+ edema bilaterally in her legs.  Lymphadenopathy:     Cervical: No cervical adenopathy.  Skin:    General: Skin is warm and dry.     Findings: No erythema or rash.  Neurological:     Mental Status: She is alert and oriented to person, place, and time.  Psychiatric:        Behavior: Behavior normal.        Thought Content: Thought content normal.        Judgment: Judgment normal.      Lab Results  Component Value Date   WBC 5.8 11/08/2022   HGB 12.1 11/08/2022   HCT 37.0 11/08/2022   MCV 88.3 11/08/2022   PLT 201 11/08/2022     Chemistry      Component Value Date/Time   NA 141 11/08/2022 1058   K 3.9 11/08/2022 1058   CL 106 11/08/2022 1058   CO2 20 (L) 11/08/2022 1058   BUN 30 (H) 11/08/2022 1058   CREATININE 4.31 (HH) 11/08/2022 1058      Component Value Date/Time   CALCIUM 10.0 11/08/2022 1058   ALKPHOS 102 11/08/2022 1058   AST 24 11/08/2022 1058   ALT 9 11/08/2022 1058   BILITOT 0.9 11/08/2022 1058      Impression and Plan: Ms. Golding is a very nice 62 year old African-American female.  She has IgG kappa myeloma.  The problem clearly is with the kappa light  chains.   Again, she really had a tough time in the hospital.  Thankfully, she seemed to get a little bit better.  Hopefully, we will see that her light chains are improving.  Her renal function is getting a little bit better.  We we will go ahead with the Kyprolis/Cytoxan/Faspro.  I think getting Huel Cote will be able to help [place] out.  I just wish that she would have the surgery to help these fractures.  I talked her about this again today.  She just does not feel like she can manage surgery.  We will go ahead and have her come back weekly for treatment.  I think she gets Kyprolis /Cytoxan next week.  Following that, she gets Kyprolis/Cytoxan and Faspro.     Volanda Napoleon, MD 1/5/20243:19 PM

## 2022-11-08 NOTE — Progress Notes (Signed)
Per MD VO to update dose with current weight, rounds to '110mg'$ 

## 2022-11-08 NOTE — Progress Notes (Signed)
Critical lab result received from lab of creatinine 4.31  Dr. Marin Olp aware and no new orders received. Per Dr. Marin Olp pt ok to treat with today's lab values.

## 2022-11-08 NOTE — Progress Notes (Unsigned)
Patient has had a difficult time post hospitalization. She refuses surgery for her arm fracture, she's had recurrent pleural effusions, and she is having issues with quality of life regarding pain and treatment side effects. Initially she thought she didn't want to do anything further treatment wise, but once she spoke to Dr Marin Olp, she has chosen to keep trying. She will receive treatment today.   Oncology Nurse Navigator Documentation     11/08/2022    2:45 PM  Oncology Nurse Navigator Flowsheets  Navigator Follow Up Date: 11/15/2022  Navigator Follow Up Reason: Follow-up Appointment;Chemotherapy  Navigator Location [facility]  Navigator Encounter Type Treatment;Appt/Treatment Plan Review  Patient Visit Type MedOnc  Treatment Phase Active Tx  Barriers/Navigation Needs Coordination of Care;Education  Interventions Psycho-Social Support  Acuity Level 2-Minimal Needs (1-2 Barriers Identified)  Support Groups/Services Friends and Family  Time Spent with Patient 15

## 2022-11-08 NOTE — Progress Notes (Signed)
Dr Marin Olp wants  patient treated today despite labs

## 2022-11-08 NOTE — Radiation Completion Notes (Signed)
Patient Name: Michelle [name], KNOCHE MRN: [id number] Date of Birth: 1960-12-16 Referring Physician: Leeroy Cha, M.D. Date of Service: 2022-11-08 Radiation Oncologist: Eppie Gibson, M.D. [place]                             Radiation Oncology End of Treatment Note     Diagnosis: C90.00 Multiple myeloma not having achieved remission Intent: Palliative     ==========DELIVERED PLANS==========  First Treatment Date: 2022-10-03 - Last Treatment Date: 2022-10-17   Plan Name: Brain_Skull Site: Cranium Technique: 3D Mode: Photon Dose Per Fraction: 2 Gy Prescribed Dose (Delivered / Prescribed): 20 Gy / 20 Gy Prescribed Fxs (Delivered / Prescribed): 10 / 10     ==========ON TREATMENT VISIT DATES========== 2022-10-07, 2022-10-14     ==========UPCOMING VISITS==========       ==========APPENDIX - ON TREATMENT VISIT NOTES==========   PatEd 2022-06-14 Ongoing education performed.   ImpPlan 2022-06-14 The patient is tolerating radiation. Continue treatment as planned.   PhysExam 2022-06-14 Alert, no acute distress.   ProgNote 2022-06-14 Specific Site [ Lumbar spine ] Changes from last week/visit? [ No, first vist ] Pain? [ No ] Fatigue? [ Yes ] Skin irritation? [ No ] Current medication regimen: [ Yes ] Need refills: [ No ] Additional  Weekly Progress Notes [  ]    PatEd 2022-06-21 Ongoing education performed.   ImpPlan 2022-06-21 The patient is tolerating radiation. Continue treatment as planned.   PhysExam 2022-06-21 Alert, no acute distress.   ProgNote 2022-06-21 Specific Site [ Lumbar Spine ] Changes from last week/visit? [  ] Pain? [ Yes- back pain rating 6/10. ] Fatigue? [ Yes ] Skin irritation? [ No ] Current medication regimen: [  ] Need refills: [ No ] Additional  Weekly Progress Notes [ Patient currently inpatient.  ]    PatEd 2022-06-26 Ongoing education performed.   ImpPlan 2022-06-26 The patient is tolerating  radiation. Continue treatment as planned.   PhysExam 2022-06-26 Alert, no acute distress.   PatEd 2022-10-07 Ongoing education performed.   ImpPlan 2022-10-07 The patient is tolerating radiation. Continue treatment as planned.   PhysExam 2022-10-07 Alert, no acute distress.   RunningNotes 2022-10-07 10-07-22 education   ProgNote 2022-10-07 Changes from last week/visit? [ Yes ] Headaches? Cognitive changes? [ No, more of short pressure in head ] Fatigue? Nausea? [ Yes, mild fatigue, mild nausea ] Skin irritation? (ex: forehead/scalp/ears) [ No ] Vision or auditory changes? [ Yes, improving but still seeing double ] Diff. doing fine motor skills? [ No ] Aphasia or slurred speech? [ Yes, trouble putting words together at times ] Decadron dose? [ no ] Need refills: [ No ] Additional  Weekly Progress Notes [ mild nausea, vomited saturday ]    PatEd 2022-10-14 Ongoing education performed.   ImpPlan 2022-10-14 The patient is tolerating radiation. Continue treatment as planned.   PhysExam 2022-10-14 Alert, no acute distress.   ProgNote 2022-10-14 Changes from last week/visit? [ Yes, nausea remains ] Headaches? Cognitive changes? [ Yes, started having headaches and pain in eye ] Fatigue? Nausea? [ Yes, fatigue and nausea ] Skin irritation? (ex: forehead/scalp/ears) [ No ] Vision or auditory changes? [ double vision in left eye ] Diff. doing fine motor skills? [ No ] Aphasia or slurred speech? [ No ] Decadron dose? [ no ] Need refills: [ No ] Additional  Weekly Progress Notes [ feeling more tired this week.  ]

## 2022-11-08 NOTE — Telephone Encounter (Signed)
Received phone call from patient daughter and patient stating they were going to be late for appointment. Pt stating that she was in too much pain and did not want to get up and come in. Per daughter patient had taken pain medication at [facility]. This RN discussed with patient that it is hard and that having a cancer diagnosis is hard. Pt educated that if her pain is not controlled it would be best to come in so that we can help with pain control. Pt aware that we can cancel her appointment or she can call 911 and go to the ER for pain control. Pt very tearful and repeating that "its just too hard and I don't want to do this" Pt and daughter encouraged to take ativan and start to get up after pain medication has taken effect. Pt states she will come into appointment but "I don't want to keep doing this" Pt aware that Dr. Marin Olp will be notified and this office will do all it can to help her. Pt verbalized understanding and stated she will be coming for her appointment today.

## 2022-11-11 ENCOUNTER — Telehealth: Payer: Self-pay | Admitting: *Deleted

## 2022-11-11 LAB — KAPPA/LAMBDA LIGHT CHAINS
Kappa free light chain: 3158.3 mg/L — ABNORMAL HIGH (ref 3.3–19.4)
Kappa, lambda light chain ratio: 1052.77 — ABNORMAL HIGH (ref 0.26–1.65)
Lambda free light chains: 3 mg/L — ABNORMAL LOW (ref 5.7–26.3)

## 2022-11-11 LAB — IGG, IGA, IGM
IgA: <5 mg/dL — ABNORMAL LOW (ref 87–352)
IgG (Immunoglobin G), Serum: 902 mg/dL (ref 586–1602)
IgM (Immunoglobulin M), Srm: <5 mg/dL — ABNORMAL LOW (ref 26–217)

## 2022-11-11 NOTE — Telephone Encounter (Signed)
Received a call from [facility] with [facility] Physical Therapy requesting continuation of her Physical Therapy.  Asked whether she was to be non weight bearing due to impending fracture on left leg.  Dr Marin Olp states that from his perspective she should be non weight bearing but that is up to his orthopaedic surgeon.  Also inquired about drug interactions with Fluconazole, Oxycodone, Diazepam and Ondansetron.  Dr Marin Olp is aware and ok to continue with medications.  [name] understands and appreciates the information.

## 2022-11-12 ENCOUNTER — Encounter: Payer: Self-pay | Admitting: Hematology & Oncology

## 2022-11-12 NOTE — Progress Notes (Signed)
Michelle [name] presents today for follow up after completing radiation therapy for multiple myeloma. She completed treatment on 10-17-22.   Recent neurologic symptoms, if any:  Seizures: none Headaches: none Nausea: none Dizziness/ataxia: none, intermittent with position change Difficulty with hand coordination: none Focal numbness/weakness: remains weak, uses wheelchair mostly Visual deficits/changes: double vision Confusion/Memory deficits: baseline memory, not worse  Other issues of note: no concerns at this time, in a lot of pain 8/10 with arm  Wt Readings from Last 3 Encounters:  10/30/22 177 lb 7.5 oz (80.5 kg)  10/17/22 165 lb (74.8 kg)  10/11/22 162 lb (73.5 kg)   Vitals:   11/19/22 1455  BP: 137/78  Pulse: (!) 145  Resp: 20  Temp: (!) 97.2 F (36.2 C)  SpO2: 98%

## 2022-11-14 ENCOUNTER — Telehealth: Payer: Self-pay | Admitting: Orthopedic Surgery

## 2022-11-14 NOTE — Telephone Encounter (Signed)
Looking for any precautions for weight bearing..[facility]--[phone]

## 2022-11-14 NOTE — Telephone Encounter (Signed)
I called [name] and advised.

## 2022-11-14 NOTE — Telephone Encounter (Signed)
My recommendation based on her imaging studies is nonweightbearing on the left leg and weightbearing for transfers only on the right leg.

## 2022-11-15 ENCOUNTER — Encounter: Payer: Self-pay | Admitting: *Deleted

## 2022-11-15 ENCOUNTER — Inpatient Hospital Stay: Payer: BC Managed Care – PPO

## 2022-11-15 ENCOUNTER — Telehealth: Payer: Self-pay | Admitting: *Deleted

## 2022-11-15 ENCOUNTER — Inpatient Hospital Stay (HOSPITAL_BASED_OUTPATIENT_CLINIC_OR_DEPARTMENT_OTHER): Payer: BC Managed Care – PPO | Admitting: Hematology & Oncology

## 2022-11-15 ENCOUNTER — Encounter: Payer: Self-pay | Admitting: Hematology & Oncology

## 2022-11-15 VITALS — BP 134/80 | HR 122 | Temp 99.0°F | Resp 28 | Ht 64.0 in

## 2022-11-15 VITALS — BP 131/87 | HR 153 | Resp 24

## 2022-11-15 DIAGNOSIS — C9000 Multiple myeloma not having achieved remission: Secondary | ICD-10-CM

## 2022-11-15 DIAGNOSIS — D631 Anemia in chronic kidney disease: Secondary | ICD-10-CM

## 2022-11-15 DIAGNOSIS — Z5111 Encounter for antineoplastic chemotherapy: Secondary | ICD-10-CM | POA: Diagnosis not present

## 2022-11-15 LAB — CMP (CANCER CENTER ONLY)
ALT: 8 U/L (ref 0–44)
AST: 21 U/L (ref 15–41)
Albumin: 3.8 g/dL (ref 3.5–5.0)
Alkaline Phosphatase: 95 U/L (ref 38–126)
Anion gap: 16 — ABNORMAL HIGH (ref 5–15)
BUN: 29 mg/dL — ABNORMAL HIGH (ref 8–23)
CO2: 19 mmol/L — ABNORMAL LOW (ref 22–32)
Calcium: 11.2 mg/dL — ABNORMAL HIGH (ref 8.9–10.3)
Chloride: 105 mmol/L (ref 98–111)
Creatinine: 3.92 mg/dL (ref 0.44–1.00)
GFR, Estimated: 12 mL/min — ABNORMAL LOW (ref >60)
Glucose, Bld: 92 mg/dL (ref 70–99)
Potassium: 4 mmol/L (ref 3.5–5.1)
Sodium: 140 mmol/L (ref 135–145)
Total Bilirubin: 1 mg/dL (ref 0.3–1.2)
Total Protein: 6.6 g/dL (ref 6.5–8.1)

## 2022-11-15 LAB — CBC WITH DIFFERENTIAL (CANCER CENTER ONLY)
Abs Immature Granulocytes: 0.1 10*3/uL — ABNORMAL HIGH (ref 0.00–0.07)
Basophils Absolute: 0 10*3/uL (ref 0.0–0.1)
Basophils Relative: 0 %
Eosinophils Absolute: 0 10*3/uL (ref 0.0–0.5)
Eosinophils Relative: 0 %
HCT: 35.8 % — ABNORMAL LOW (ref 36.0–46.0)
Hemoglobin: 11.7 g/dL — ABNORMAL LOW (ref 12.0–15.0)
Immature Granulocytes: 1 %
Lymphocytes Relative: 18 %
Lymphs Abs: 1.7 10*3/uL (ref 0.7–4.0)
MCH: 28.5 pg (ref 26.0–34.0)
MCHC: 32.7 g/dL (ref 30.0–36.0)
MCV: 87.3 fL (ref 80.0–100.0)
Monocytes Absolute: 3 10*3/uL — ABNORMAL HIGH (ref 0.1–1.0)
Monocytes Relative: 33 %
Neutro Abs: 4.2 10*3/uL (ref 1.7–7.7)
Neutrophils Relative %: 48 %
Platelet Count: 92 10*3/uL — ABNORMAL LOW (ref 150–400)
RBC: 4.1 MIL/uL (ref 3.87–5.11)
RDW: 20.7 % — ABNORMAL HIGH (ref 11.5–15.5)
WBC Count: 9 10*3/uL (ref 4.0–10.5)
nRBC: 0 % (ref 0.0–0.2)

## 2022-11-15 LAB — SAMPLE TO BLOOD BANK

## 2022-11-15 LAB — LACTATE DEHYDROGENASE: LDH: 361 U/L — ABNORMAL HIGH (ref 98–192)

## 2022-11-15 MED ORDER — SODIUM CHLORIDE 0.9 % IV SOLN: Freq: Once | INTRAVENOUS | Status: AC

## 2022-11-15 MED ORDER — HEPARIN SOD (PORK) LOCK FLUSH 100 UNIT/ML IV SOLN
500.0000 [IU] | Freq: Once | INTRAVENOUS | Status: AC | PRN
  Administered 2022-11-15: 500 [IU]

## 2022-11-15 MED ORDER — SODIUM CHLORIDE 0.9 % IV SOLN
400.0000 mg | Freq: Once | INTRAVENOUS | Status: AC
  Administered 2022-11-15: 400 mg via INTRAVENOUS
  Filled 2022-11-15: qty 20

## 2022-11-15 MED ORDER — DENOSUMAB 120 MG/1.7ML ~~LOC~~ SOLN
120.0000 mg | Freq: Once | SUBCUTANEOUS | Status: AC
  Administered 2022-11-15: 120 mg via SUBCUTANEOUS
  Filled 2022-11-15: qty 1.7

## 2022-11-15 MED ORDER — DEXTROSE 5 % IV SOLN
56.0000 mg/m2 | Freq: Once | INTRAVENOUS | Status: AC
  Administered 2022-11-15: 110 mg via INTRAVENOUS
  Filled 2022-11-15: qty 30

## 2022-11-15 MED ORDER — SODIUM CHLORIDE 0.9% FLUSH
10.0000 mL | INTRAVENOUS | Status: DC | PRN
  Administered 2022-11-15: 10 mL

## 2022-11-15 MED ORDER — TEMAZEPAM 7.5 MG PO CAPS: 30.0000 mg | ORAL_CAPSULE | Freq: Every evening | ORAL | 0 refills | Status: AC | PRN

## 2022-11-15 MED ORDER — SODIUM CHLORIDE 0.9 % IV SOLN
20.0000 mg | Freq: Once | INTRAVENOUS | Status: AC
  Administered 2022-11-15: 20 mg via INTRAVENOUS
  Filled 2022-11-15: qty 20

## 2022-11-15 NOTE — Patient Instructions (Signed)
[facility]  Discharge Instructions: Thank you for choosing [facility] to provide your oncology and hematology care.   If you have a lab appointment with the [facility], please go directly to the [facility] and check in at the registration area.  Wear comfortable clothing and clothing appropriate for easy access to any Portacath or PICC line.   We strive to give you quality time with your provider. You may need to reschedule your appointment if you arrive late (15 or more minutes).  Arriving late affects you and other patients whose appointments are after yours.  Also, if you miss three or more appointments without notifying the office, you may be dismissed from the clinic at the provider's discretion.      For prescription refill requests, have your pharmacy contact our office and allow 72 hours for refills to be completed.    Today you received the following chemotherapy and/or immunotherapy agents Kyprolis, Cytoxan.      To help prevent nausea and vomiting after your treatment, we encourage you to take your nausea medication as directed.  BELOW ARE SYMPTOMS THAT SHOULD BE REPORTED IMMEDIATELY: *FEVER GREATER THAN 100.4 F (38 C) OR HIGHER *CHILLS OR SWEATING *NAUSEA AND VOMITING THAT IS NOT CONTROLLED WITH YOUR NAUSEA MEDICATION *UNUSUAL SHORTNESS OF BREATH *UNUSUAL BRUISING OR BLEEDING *URINARY PROBLEMS (pain or burning when urinating, or frequent urination) *BOWEL PROBLEMS (unusual diarrhea, constipation, pain near the anus) TENDERNESS IN MOUTH AND THROAT WITH OR WITHOUT PRESENCE OF ULCERS (sore throat, sores in mouth, or a toothache) UNUSUAL RASH, SWELLING OR PAIN  UNUSUAL VAGINAL DISCHARGE OR ITCHING   Items with * indicate a potential emergency and should be followed up as soon as possible or go to the Emergency Department if any problems should occur.  Please show the CHEMOTHERAPY ALERT CARD or IMMUNOTHERAPY ALERT CARD at check-in  to the Emergency Department and triage nurse. Should you have questions after your visit or need to cancel or reschedule your appointment, please contact [facility]  [phone] and follow the prompts.  Office hours are 8:00 a.m. to 4:30 p.m. Monday - Friday. Please note that voicemails left after 4:00 p.m. may not be returned until the following business day.  We are closed weekends and major holidays. You have access to a nurse at all times for urgent questions. Please call the main number to the clinic [phone] and follow the prompts.  For any non-urgent questions, you may also contact your provider using MyChart. We now offer e-Visits for anyone 33 and older to request care online for non-urgent symptoms. For details visit [email or web address].   Also download the MyChart app! Go to the app store, search "MyChart", open the app, select [facility], and log in with your MyChart username and password.

## 2022-11-15 NOTE — Progress Notes (Signed)
Hematology and Oncology Follow Up Visit  Michelle [name] [id number] Nov 17, 1960 62 y.o. 11/15/2022   Principle Diagnosis:  IgG Kappa myeloma -- normal cytogenetics -- dup 1p/17p-/t(14:20) Anemia of renal failure -- stage 3b  Current Therapy:   Faspro//Cytoxan/Kyprolis /Decadron -- s/p cycle #2 on 10/11/2022 XRT to lumbar spine -- completed on 06/26/2022 XRT to the skull base --started on 10/07/2022 Xgeva 120 mg sq q 3 months - next dose in 01/2023 Aranesp 300 mcg sq q month for Hgb < 11     Interim History:  Michelle [name] is back for follow-up.  She is still having some issues.  She is still having problems with fatigue.  She is quite a tired.  She is little bit lethargic.  Her calcium is up a little bit today.  Will have to give her some Xgeva.  She has not complained of any pain.  She does have the left arm in a sling because of a pathologic fracture of the left humerus.  I am unsure if she is really able to walk around much.  She has the fractures in the hips bilaterally.  I think she seems to have tolerated chemotherapy pretty well.  Her daughter says she is not sleeping at all.  I will call in some Restoril (7.5 mg p.o. nightly) and how this may help her rest.  Her daughter says that she is eating fairly well.  There is no nausea or vomiting.  She is having problems with some constipation.  She has not gone to the bathroom about 7 days.  Is hard to say if this really bothers her.  She has had no bleeding.  She has had no fever.  There is been no rashes.  Overall, I would have said that her performance status is probably ECOG 2.  Medications:  Current Outpatient Medications:    famciclovir (FAMVIR) 500 MG tablet, Take 1 tablet (500 mg total) by mouth daily., Disp: 30 tablet, Rfl: 6   fluconazole (DIFLUCAN) 100 MG tablet, Take 100 mg by mouth daily., Disp: , Rfl:    lactulose (CHRONULAC) 10 GM/15ML solution, TAKE 15 MLS (10 G TOTAL) BY MOUTH 2 (TWO) TIMES DAILY., Disp: 1000 mL, Rfl:  4   levothyroxine (SYNTHROID) 88 MCG tablet, Take 88 mcg by mouth daily before breakfast., Disp: , Rfl:    LORazepam (ATIVAN) 0.5 MG tablet, Take 1 tablet (0.5 mg total) by mouth 2 (two) times daily., Disp: 30 tablet, Rfl: 0   megestrol (MEGACE) 400 MG/10ML suspension, Take 10 mLs (400 mg total) by mouth 2 (two) times daily., Disp: 240 mL, Rfl: 0   melatonin 3 MG TABS tablet, Take 1 tablet (3 mg total) by mouth at bedtime as needed., Disp: 30 tablet, Rfl: 0   metoprolol tartrate (LOPRESSOR) 25 MG tablet, Take 0.5 tablets (12.5 mg total) by mouth 2 (two) times daily., Disp: 60 tablet, Rfl: 0   Multiple Vitamin (MULTIVITAMIN WITH MINERALS) TABS tablet, Take 1 tablet by mouth daily., Disp: , Rfl:    ondansetron (ZOFRAN) 8 MG tablet, Take 1 tablet (8 mg total) by mouth every 8 (eight) hours as needed for nausea or vomiting., Disp: 20 tablet, Rfl: 2   Oxycodone HCl 10 MG TABS, Take 0.5-1 tablets (5-10 mg total) by mouth every 6 (six) hours as needed (Mod to severe pain)., Disp: 30 tablet, Rfl: 0   pantoprazole (PROTONIX) 40 MG tablet, Take 1 tablet (40 mg total) by mouth 2 (two) times daily., Disp: 60 tablet, Rfl: 6  rosuvastatin (CRESTOR) 10 MG tablet, Take 10 mg by mouth every evening., Disp: , Rfl:    senna (SENOKOT) 8.6 MG TABS tablet, Take 1 tablet (8.6 mg total) by mouth daily as needed for mild constipation or moderate constipation., Disp: 30 tablet, Rfl: 0   diazepam (VALIUM) 5 MG tablet, Take thirty minutes before MRI for anxiety. (Patient not taking: Reported on 11/08/2022), Disp: 1 tablet, Rfl: 0   prochlorperazine (COMPAZINE) 10 MG tablet, Take 1 tablet (10 mg total) by mouth every 6 (six) hours as needed for nausea or vomiting. (Patient not taking: Reported on 11/15/2022), Disp: 30 tablet, Rfl: 5  Allergies:  Allergies  Allergen Reactions   Penicillins Other (See Comments)    Severe Yeast infection... No deathly reactions   Codeine Nausea Only   Morphine Other (See Comments)    headache    Tramadol Other (See Comments)    Headaches     Past Medical History, Surgical history, Social history, and Family History were reviewed and updated.  Review of Systems: Review of Systems  Constitutional:  Positive for fatigue.  HENT:  Negative.    Eyes: Negative.   Respiratory: Negative.    Cardiovascular:  Positive for leg swelling.  Gastrointestinal: Negative.   Endocrine: Negative.   Genitourinary: Negative.    Musculoskeletal:  Positive for arthralgias, back pain and myalgias.  Skin: Negative.   Neurological: Negative.   Hematological: Negative.   Psychiatric/Behavioral: Negative.      Physical Exam:  height is '5\' 4"'$  (1.626 m). Her oral temperature is 99 F (37.2 C). Her blood pressure is 134/80 and her pulse is 122 (abnormal). Her respiration is 28 (abnormal) and oxygen saturation is 99%.   Wt Readings from Last 3 Encounters:  10/30/22 177 lb 7.5 oz (80.5 kg)  10/17/22 165 lb (74.8 kg)  10/11/22 162 lb (73.5 kg)    Physical Exam Vitals reviewed.  HENT:     Head: Normocephalic and atraumatic.  Eyes:     Pupils: Pupils are equal, round, and reactive to light.  Cardiovascular:     Rate and Rhythm: Normal rate and regular rhythm.     Heart sounds: Normal heart sounds.  Pulmonary:     Effort: Pulmonary effort is normal.     Breath sounds: Normal breath sounds.  Abdominal:     General: Bowel sounds are normal.     Palpations: Abdomen is soft.  Musculoskeletal:        General: No tenderness or deformity. Normal range of motion.     Cervical back: Normal range of motion.     Comments: Her lower extremities does show some swelling bilateral.  She probably has 2+ edema bilaterally in her legs.  Lymphadenopathy:     Cervical: No cervical adenopathy.  Skin:    General: Skin is warm and dry.     Findings: No erythema or rash.  Neurological:     Mental Status: She is alert and oriented to person, place, and time.  Psychiatric:        Behavior: Behavior normal.         Thought Content: Thought content normal.        Judgment: Judgment normal.     Lab Results  Component Value Date   WBC 5.8 11/08/2022   HGB 12.1 11/08/2022   HCT 37.0 11/08/2022   MCV 88.3 11/08/2022   PLT 201 11/08/2022     Chemistry      Component Value Date/Time   NA 141 11/08/2022 1058  K 3.9 11/08/2022 1058   CL 106 11/08/2022 1058   CO2 20 (L) 11/08/2022 1058   BUN 30 (H) 11/08/2022 1058   CREATININE 4.31 (HH) 11/08/2022 1058      Component Value Date/Time   CALCIUM 10.0 11/08/2022 1058   ALKPHOS 102 11/08/2022 1058   AST 24 11/08/2022 1058   ALT 9 11/08/2022 1058   BILITOT 0.9 11/08/2022 1058      Impression and Plan: Ms. Hellickson is a very nice 62 year old African-American female.  She has IgG kappa myeloma.  The problem clearly is with the kappa light chains.   Her renal function is getting better slowly.  The LDH is coming down slowly.  Her hemoglobin is holding steady.  Her last Kappa light chain's were 316 mg/dL.  This is up slightly.  Again we will have to watch this closely.  I just want to do what we can do to help her quality of life.  I really hate that she is having such a tough time with her quality of life.  I know she is trying hard.  She has a lot of help from her family.  I really have to give her family a lot of credit for trying to make her life easier.  I know that she did not take medicines today.  Her heart rate is up a little bit.  Her blood pressure is fine so I think we are okay for treatment today.  Her calcium is up a little bit so we are going to give her some Xgeva to try to help bring that down.  Will see if this may help with her overall fatigue and somewhat lethargy.  We are going to have to follow her weekly.  There is just a lot going on with her.  Thankfully, we do not have to transfuse her.   Volanda Napoleon, MD 1/12/202411:36 AM

## 2022-11-15 NOTE — Patient Instructions (Signed)

## 2022-11-15 NOTE — Telephone Encounter (Signed)
Dr. Marin Olp notified of creat-3.92.  No new orders received at this time.

## 2022-11-15 NOTE — Progress Notes (Signed)
Patient has continued to decline over the last week. She still wants to proceed with treatment. Due to her condition, she will be seen weekly by Dr Marin Olp when coming in for treatment.  Oncology Nurse Navigator Documentation     11/15/2022   11:30 AM  Oncology Nurse Navigator Flowsheets  Navigator Follow Up Date: 11/22/2022  Navigator Follow Up Reason: Follow-up Appointment;Chemotherapy  Navigator Location [facility]  Navigator Encounter Type Treatment;Appt/Treatment Plan Review  Patient Visit Type MedOnc  Treatment Phase Active Tx  Barriers/Navigation Needs Coordination of Care;Education  Interventions Psycho-Social Support  Acuity Level 2-Minimal Needs (1-2 Barriers Identified)  Support Groups/Services Friends and Family  Time Spent with Patient 15

## 2022-11-15 NOTE — Progress Notes (Signed)
Dr. Marin Olp has reviewed vitals and labs, patient is okay for treatment today.

## 2022-11-17 LAB — IGG, IGA, IGM
IgA: <5 mg/dL — ABNORMAL LOW (ref 87–352)
IgG (Immunoglobin G), Serum: 975 mg/dL (ref 586–1602)
IgM (Immunoglobulin M), Srm: <5 mg/dL — ABNORMAL LOW (ref 26–217)

## 2022-11-18 LAB — KAPPA/LAMBDA LIGHT CHAINS
Kappa free light chain: 2860.4 mg/L — ABNORMAL HIGH (ref 3.3–19.4)
Kappa, lambda light chain ratio: 1100.15 — ABNORMAL HIGH (ref 0.26–1.65)
Lambda free light chains: 2.6 mg/L — ABNORMAL LOW (ref 5.7–26.3)

## 2022-11-19 ENCOUNTER — Other Ambulatory Visit: Payer: Self-pay

## 2022-11-19 ENCOUNTER — Ambulatory Visit
Admission: RE | Admit: 2022-11-19 | Discharge: 2022-11-19 | Disposition: A | Discharge status: Home / Self Care | Payer: BC Managed Care – PPO | Source: Ambulatory Visit | Attending: Radiation Oncology | Admitting: Radiation Oncology

## 2022-11-19 ENCOUNTER — Encounter: Payer: Self-pay | Admitting: Radiation Oncology

## 2022-11-19 VITALS — BP 137/78 | HR 145 | Temp 97.2°F | Resp 20 | Ht 64.0 in

## 2022-11-19 DIAGNOSIS — C9000 Multiple myeloma not having achieved remission: Secondary | ICD-10-CM | POA: Diagnosis not present

## 2022-11-19 DIAGNOSIS — Z79624 Long term (current) use of inhibitors of nucleotide synthesis: Secondary | ICD-10-CM | POA: Diagnosis not present

## 2022-11-19 DIAGNOSIS — Z79890 Hormone replacement therapy (postmenopausal): Secondary | ICD-10-CM | POA: Insufficient documentation

## 2022-11-19 DIAGNOSIS — K219 Gastro-esophageal reflux disease without esophagitis: Secondary | ICD-10-CM

## 2022-11-19 DIAGNOSIS — Z79899 Other long term (current) drug therapy: Secondary | ICD-10-CM | POA: Diagnosis not present

## 2022-11-19 MED ORDER — LORAZEPAM 1 MG PO TABS: 1.0000 mg | ORAL_TABLET | Freq: Two times a day (BID) | ORAL | 0 refills | Status: AC | PRN

## 2022-11-19 NOTE — Progress Notes (Signed)
Patient daughter calling about increasing patients ativan as she is has increased anxiety. Dicussed with MD. Faythe Ghee to increase to '1mg'$  BID. New order placed.

## 2022-11-19 NOTE — Progress Notes (Signed)
Radiation Oncology         [phone] ________________________________  Name: Michelle [name] MRN: [id number]  Date: 11/19/2022  DOB: 08-16-1961  Follow-Up Visit Note  Outpatient  CC: Leeroy Cha, MD  Leeroy Cha,*  Diagnosis and Prior Radiotherapy:    ICD-10-CM   1. Multiple myeloma without remission ([place])  C90.00 Ambulatory referral to Social Work      CHIEF COMPLAINT: Here for 1 month follow-up after palliative radiation to skull base lesion from multiple myeloma without remission.   She completed 20 Gy in 10 fractions to the skull base lesion on 10/17/22.    Narrative:  The patient returns today for 1 month routine follow-up. She is present with her daughter today who has helped as a historian. Patient states that her double vision has unfortunately not improved since completing the radiation therapy. Her daughter endorses this. She denies any headaches or memory issues. The patient and daughter have no concerns at this time other than a sense of overwhelm.  ALLERGIES:  is allergic to penicillins, codeine, morphine, and tramadol.  Meds: Current Outpatient Medications  Medication Sig Dispense Refill   diazepam (VALIUM) 5 MG tablet Take thirty minutes before MRI for anxiety. 1 tablet 0   famciclovir (FAMVIR) 500 MG tablet Take 1 tablet (500 mg total) by mouth daily. 30 tablet 6   fluconazole (DIFLUCAN) 100 MG tablet Take 100 mg by mouth daily.     lactulose (CHRONULAC) 10 GM/15ML solution TAKE 15 MLS (10 G TOTAL) BY MOUTH 2 (TWO) TIMES DAILY. 1000 mL 4   levothyroxine (SYNTHROID) 88 MCG tablet Take 88 mcg by mouth daily before breakfast.     LORazepam (ATIVAN) 1 MG tablet Take 1 tablet (1 mg total) by mouth 2 (two) times daily as needed for anxiety. 60 tablet 0   megestrol (MEGACE) 400 MG/10ML suspension Take 10 mLs (400 mg total) by mouth 2 (two) times daily. 240 mL 0   melatonin 3 MG TABS tablet Take 1 tablet (3 mg total) by mouth at bedtime as needed. 30  tablet 0   metoprolol tartrate (LOPRESSOR) 25 MG tablet Take 0.5 tablets (12.5 mg total) by mouth 2 (two) times daily. 60 tablet 0   Multiple Vitamin (MULTIVITAMIN WITH MINERALS) TABS tablet Take 1 tablet by mouth daily.     ondansetron (ZOFRAN) 8 MG tablet Take 1 tablet (8 mg total) by mouth every 8 (eight) hours as needed for nausea or vomiting. 20 tablet 2   Oxycodone HCl 10 MG TABS Take 0.5-1 tablets (5-10 mg total) by mouth every 6 (six) hours as needed (Mod to severe pain). 30 tablet 0   pantoprazole (PROTONIX) 40 MG tablet Take 1 tablet (40 mg total) by mouth 2 (two) times daily. 60 tablet 6   prochlorperazine (COMPAZINE) 10 MG tablet Take 1 tablet (10 mg total) by mouth every 6 (six) hours as needed for nausea or vomiting. 30 tablet 5   rosuvastatin (CRESTOR) 10 MG tablet Take 10 mg by mouth every evening.     senna (SENOKOT) 8.6 MG TABS tablet Take 1 tablet (8.6 mg total) by mouth daily as needed for mild constipation or moderate constipation. 30 tablet 0   temazepam (RESTORIL) 7.5 MG capsule Take 4 capsules (30 mg total) by mouth at bedtime as needed for sleep. 30 capsule 0   No current facility-administered medications for this encounter.    Physical Findings: The patient is in no acute distress. Patient is alert but does not seem fully orientated.  height is '5\' 4"'$  (1.626 m). Her temporal temperature is 97.2 F (36.2 C) (abnormal). Her blood pressure is 137/78 and her pulse is 145 (abnormal). Her respiration is 20 and oxygen saturation is 98%. .    Heart regular in rate and rhythm, with no murmurs, clicks, or gallops. Lungs are CTA in all lung fields No palpable cervical, supraclavicular, or right axiliary lymphadenopathy.  Satisfactory skin healing in radiotherapy fields.  Patient unable to participate in EOM and coordination test despite our best efforts     Lab Findings: Lab Results  Component Value Date   WBC 9.0 11/15/2022   HGB 11.7 (L) 11/15/2022   HCT 35.8 (L)  11/15/2022   MCV 87.3 11/15/2022   PLT 92 (L) 11/15/2022    Radiographic Findings: DG HIP UNILAT WITH PELVIS 2-3 VIEWS LEFT  Result Date: 10/22/2022 CLINICAL DATA:  Left groin pain.  History of multiple myeloma. EXAM: DG HIP (WITH OR WITHOUT PELVIS) 2-3V LEFT COMPARISON:  CT abdomen 10/19/2022, CT abdomen pelvis 07/18/2022 FINDINGS: Compared to 07/18/2022 CT, there is a new lytic lesion with destruction of the left lesser trochanter. The tip of the left lesser trochanter is superiorly displaced approximately 1.8 cm. This is suspicious for a bone metastasis and pathologic avulsion. Multiple additional bilateral proximal femoral lucencies are again seen consistent with lytic metastases. The largest is seen within the right femoral neck measuring up to approximately 3.8 cm in diameter, similar to 07/18/2022 CT. Multiple lytic lesions are again seen throughout the bilateral superior and inferior pubic rami and bilateral iliac bones, although better visualized on prior CT. On prior 07/18/2022 CT, the largest was with in the left ilium, and there is diffuse lucency within the superior left ilium corresponding to this lesion. IMPRESSION: 1. Compared to 07/18/2022 CT, there is a new left lesser trochanter lytic lesion consistent with a bone metastasis and pathologic displaced avulsion fracture. 2. Multiple additional lytic lesions are again seen throughout the bilateral superior and inferior pubic rami and bilateral iliac bones, as on prior CT and consistent with reported history of multiple myeloma. Electronically Signed   By: Yvonne Kendall M.D.   On: 10/22/2022 11:16    Impression/Plan: Skull base lesion from multiple myeloma without remission; currently receiving chemotherapy, s/p palliative radiation  Patient's double vision unfortunately has not seemed to have improved from palliative radiation. I recommend follow up with ophthalmologist for assistance with continued vision concerns. Family aware of this  recommendation. Patient recovered well from the radiation and there are no lasting effects on clinical exam today. Routine follow up is no longer needed, but we are happy to see the patient on an as needed bases. Patient will continue to be seen by medical oncologist, Dr. Marin Olp. It was a pleasure taking part in this patient's care and she knows she can call back with any questions or concerns. Referral recommended to social work for logistical, social, emotional support due to sense of overwhelm for patient and family. Patient did not seem to be a fully participatory as a historian today.  Recommend social work team reach out to family, first.   On date of service, in total, I spent 20 minutes on this encounter. Patient was seen in person.  _____________________________________   Leona Singleton, PA    Eppie Gibson, MD

## 2022-11-20 ENCOUNTER — Encounter: Payer: Self-pay | Admitting: Hematology & Oncology

## 2022-11-20 ENCOUNTER — Telehealth: Payer: Self-pay

## 2022-11-20 NOTE — Telephone Encounter (Signed)
Received phone call from daughter Michelle [name] stating that Michelle [name] is more agitated and not sleeping well. Per Michelle [name] Michelle [name] is only sleeping 30 minutes at a time. Reviewed patients chart and new prescription sent in for Ativan 1 mg on 11/19/2022 and per Michelle [name] she was unaware and will pick up today. Per Michelle [name] they have only given their [name] the restoril twice since receiving the prescription and advised to take nightly to help with sleep. Michelle [name] aware that Michelle [name] will see Dr. Marin Olp on 11/22/2022 and can discuss any further concerns. Michelle [name] also aware she can call the clinic with any other questions or concerns. Michelle [name] verbalized understanding and had no further questions.

## 2022-11-21 ENCOUNTER — Inpatient Hospital Stay: Payer: BC Managed Care – PPO

## 2022-11-21 ENCOUNTER — Telehealth: Payer: Self-pay | Admitting: Orthopedic Surgery

## 2022-11-21 NOTE — Progress Notes (Signed)
[place] Work  Initial Assessment   Michelle [name] is a 62 y.o. year old female contacted caregiver by phone. Clinical Social Work was referred by medical provider for assessment of psychosocial needs.   SDOH (Social Determinants of Health) assessments performed: Yes SDOH Interventions    Flowsheet Row ED to Hosp-Admission (Discharged) from 10/17/2022 in [place] Interventions   Housing Interventions Intervention Not Indicated       [place]: No Food Insecurity (10/18/2022)  Housing: Low Risk  (10/18/2022)  Transportation Needs: No Transportation Needs (10/18/2022)  Utilities: Not At Risk (10/18/2022)  Tobacco Use: Medium Risk (11/19/2022)     Distress Screen completed: No    11/19/2022    3:03 PM  ONCBCN DISTRESS SCREENING  Screening Type Initial Screening  Distress experienced in past week (1-10) 0      Family/Social Information:  Housing Arrangement: patient lives with her daughter, [name].  Her other daughter, [name], spends the weekend. Family members/support persons in your life? Family, Friends, and Music therapist concerns: no  Employment: Retired  Income source: Paediatric nurse concerns: No Type of concern: None Food access concerns: no Religious or spiritual practice: Yes per daughter. Services Currently in place:  [employer]  Coping/ Adjustment to diagnosis: Patient understands treatment plan and what happens next? yes Concerns about diagnosis and/or treatment: I'm not especially worried about anything Patient reported stressors:  Daughter declined. Hopes and/or priorities: Family. Patient enjoys time with family/ friends Current coping skills/ strengths: Supportive family/friends     SUMMARY: Current SDOH Barriers:  Daughter did not disclose.  Clinical Social Work Clinical Goal(s):  No clinical social work goals at this time  Interventions: Discussed common  feeling and emotions when being diagnosed with cancer, and the importance of support during treatment Informed patient of the support team roles and support services at [facility] Provided [place] contact information and encouraged patient to call with any questions or concerns Provided patient with information about the [employer] and [employer] and [employer].   Follow Up Plan: Patient will contact CSW with any support or resource needs Patient verbalizes understanding of plan: Yes    Rodman Pickle Ramonia Mcclaran, LCSW

## 2022-11-22 ENCOUNTER — Inpatient Hospital Stay: Payer: BC Managed Care – PPO

## 2022-11-22 ENCOUNTER — Other Ambulatory Visit: Payer: Self-pay | Admitting: *Deleted

## 2022-11-22 ENCOUNTER — Telehealth: Payer: Self-pay | Admitting: *Deleted

## 2022-11-22 ENCOUNTER — Encounter: Payer: Self-pay | Admitting: *Deleted

## 2022-11-22 ENCOUNTER — Inpatient Hospital Stay: Payer: BC Managed Care – PPO | Admitting: Hematology & Oncology

## 2022-11-22 ENCOUNTER — Ambulatory Visit (HOSPITAL_BASED_OUTPATIENT_CLINIC_OR_DEPARTMENT_OTHER)
Admission: RE | Admit: 2022-11-22 | Discharge: 2022-11-22 | Disposition: A | Discharge status: Home / Self Care | Payer: BC Managed Care – PPO | Source: Ambulatory Visit | Attending: Hematology & Oncology | Admitting: Hematology & Oncology

## 2022-11-22 ENCOUNTER — Other Ambulatory Visit: Payer: Self-pay

## 2022-11-22 ENCOUNTER — Encounter: Payer: Self-pay | Admitting: Hematology & Oncology

## 2022-11-22 VITALS — BP 126/73 | HR 155 | Temp 99.3°F | Resp 28

## 2022-11-22 VITALS — HR 138

## 2022-11-22 DIAGNOSIS — K219 Gastro-esophageal reflux disease without esophagitis: Secondary | ICD-10-CM

## 2022-11-22 DIAGNOSIS — C7951 Secondary malignant neoplasm of bone: Secondary | ICD-10-CM

## 2022-11-22 DIAGNOSIS — D631 Anemia in chronic kidney disease: Secondary | ICD-10-CM

## 2022-11-22 DIAGNOSIS — R4182 Altered mental status, unspecified: Secondary | ICD-10-CM | POA: Diagnosis not present

## 2022-11-22 DIAGNOSIS — C9000 Multiple myeloma not having achieved remission: Secondary | ICD-10-CM

## 2022-11-22 LAB — SAMPLE TO BLOOD BANK

## 2022-11-22 LAB — CBC WITH DIFFERENTIAL (CANCER CENTER ONLY)
Abs Immature Granulocytes: 0.05 10*3/uL (ref 0.00–0.07)
Basophils Absolute: 0 10*3/uL (ref 0.0–0.1)
Basophils Relative: 0 %
Eosinophils Absolute: 0 10*3/uL (ref 0.0–0.5)
Eosinophils Relative: 0 %
HCT: 31.1 % — ABNORMAL LOW (ref 36.0–46.0)
Hemoglobin: 10.3 g/dL — ABNORMAL LOW (ref 12.0–15.0)
Immature Granulocytes: 1 %
Lymphocytes Relative: 9 %
Lymphs Abs: 0.5 10*3/uL — ABNORMAL LOW (ref 0.7–4.0)
MCH: 28.5 pg (ref 26.0–34.0)
MCHC: 33.1 g/dL (ref 30.0–36.0)
MCV: 85.9 fL (ref 80.0–100.0)
Monocytes Absolute: 2.2 10*3/uL — ABNORMAL HIGH (ref 0.1–1.0)
Monocytes Relative: 36 %
Neutro Abs: 3.3 10*3/uL (ref 1.7–7.7)
Neutrophils Relative %: 54 %
Platelet Count: 142 10*3/uL — ABNORMAL LOW (ref 150–400)
RBC: 3.62 MIL/uL — ABNORMAL LOW (ref 3.87–5.11)
RDW: 21.2 % — ABNORMAL HIGH (ref 11.5–15.5)
WBC Count: 6 10*3/uL (ref 4.0–10.5)
nRBC: 0 % (ref 0.0–0.2)

## 2022-11-22 LAB — CMP (CANCER CENTER ONLY)
ALT: 9 U/L (ref 0–44)
AST: 24 U/L (ref 15–41)
Albumin: 3.8 g/dL (ref 3.5–5.0)
Alkaline Phosphatase: 106 U/L (ref 38–126)
Anion gap: 13 (ref 5–15)
BUN: 37 mg/dL — ABNORMAL HIGH (ref 8–23)
CO2: 19 mmol/L — ABNORMAL LOW (ref 22–32)
Calcium: 8.4 mg/dL — ABNORMAL LOW (ref 8.9–10.3)
Chloride: 109 mmol/L (ref 98–111)
Creatinine: 4.14 mg/dL (ref 0.44–1.00)
GFR, Estimated: 12 mL/min — ABNORMAL LOW (ref >60)
Glucose, Bld: 95 mg/dL (ref 70–99)
Potassium: 3.8 mmol/L (ref 3.5–5.1)
Sodium: 141 mmol/L (ref 135–145)
Total Bilirubin: 1.1 mg/dL (ref 0.3–1.2)
Total Protein: 6.6 g/dL (ref 6.5–8.1)

## 2022-11-22 LAB — LACTATE DEHYDROGENASE: LDH: 494 U/L — ABNORMAL HIGH (ref 98–192)

## 2022-11-22 MED ORDER — HEPARIN SOD (PORK) LOCK FLUSH 100 UNIT/ML IV SOLN: 500.0000 [IU] | Freq: Once | INTRAVENOUS | Status: DC | PRN

## 2022-11-22 MED ORDER — MORPHINE SULFATE (PF) 2 MG/ML IV SOLN: 2.0000 mg | Freq: Once | INTRAVENOUS | Status: AC

## 2022-11-22 MED ORDER — ACETAMINOPHEN 325 MG PO TABS
650.0000 mg | ORAL_TABLET | Freq: Once | ORAL | Status: AC
  Administered 2022-11-22: 650 mg via ORAL

## 2022-11-22 MED ORDER — LORAZEPAM 2 MG/ML IJ SOLN
0.5000 mg | Freq: Once | INTRAMUSCULAR | Status: AC
  Administered 2022-11-22: 0.5 mg via INTRAVENOUS

## 2022-11-22 MED ORDER — LORAZEPAM 2 MG/ML IJ SOLN: 0.5000 mg | Freq: Once | INTRAMUSCULAR | Status: DC

## 2022-11-22 MED ORDER — SODIUM CHLORIDE 0.9 % IV SOLN
20.0000 mg | Freq: Once | INTRAVENOUS | Status: AC
  Administered 2022-11-22: 20 mg via INTRAVENOUS
  Filled 2022-11-22: qty 2

## 2022-11-22 MED ORDER — SODIUM CHLORIDE 0.9% FLUSH: 10.0000 mL | INTRAVENOUS | Status: DC | PRN

## 2022-11-22 MED ORDER — SODIUM CHLORIDE 0.9% FLUSH
10.0000 mL | INTRAVENOUS | Status: DC | PRN
  Administered 2022-11-22: 10 mL via INTRAVENOUS

## 2022-11-22 MED ORDER — MORPHINE SULFATE (PF) 2 MG/ML IV SOLN: 2.0000 mg | Freq: Once | INTRAVENOUS | Status: DC

## 2022-11-22 MED ORDER — DARATUMUMAB-HYALURONIDASE-FIHJ 1800-30000 MG-UT/15ML ~~LOC~~ SOLN
1800.0000 mg | Freq: Once | SUBCUTANEOUS | Status: AC
  Administered 2022-11-22: 1800 mg via SUBCUTANEOUS
  Filled 2022-11-22: qty 15

## 2022-11-22 MED ORDER — LORAZEPAM 2 MG/ML IJ SOLN
INTRAMUSCULAR | Status: AC
  Filled 2022-11-22: qty 1

## 2022-11-22 MED ORDER — SODIUM CHLORIDE 0.9 % IV SOLN
400.0000 mg | Freq: Once | INTRAVENOUS | Status: AC
  Administered 2022-11-22: 400 mg via INTRAVENOUS
  Filled 2022-11-22: qty 20

## 2022-11-22 MED ORDER — DEXTROSE 5 % IV SOLN
56.0000 mg/m2 | Freq: Once | INTRAVENOUS | Status: AC
  Administered 2022-11-22: 110 mg via INTRAVENOUS
  Filled 2022-11-22: qty 30

## 2022-11-22 MED ORDER — DARBEPOETIN ALFA 300 MCG/0.6ML IJ SOSY
300.0000 ug | PREFILLED_SYRINGE | Freq: Once | INTRAMUSCULAR | Status: AC
  Administered 2022-11-22: 300 ug via SUBCUTANEOUS
  Filled 2022-11-22: qty 0.6

## 2022-11-22 MED ORDER — SODIUM CHLORIDE 0.9 % IV SOLN: Freq: Once | INTRAVENOUS | Status: AC

## 2022-11-22 MED ORDER — DIPHENHYDRAMINE HCL 25 MG PO CAPS
50.0000 mg | ORAL_CAPSULE | Freq: Once | ORAL | Status: AC
  Administered 2022-11-22: 25 mg via ORAL

## 2022-11-22 MED ORDER — HEPARIN SOD (PORK) LOCK FLUSH 100 UNIT/ML IV SOLN
500.0000 [IU] | Freq: Once | INTRAVENOUS | Status: AC
  Administered 2022-11-22: 500 [IU] via INTRAVENOUS

## 2022-11-22 MED ORDER — MORPHINE SULFATE (PF) 2 MG/ML IV SOLN
2.0000 mg | Freq: Once | INTRAVENOUS | Status: AC
  Administered 2022-11-22: 2 mg via INTRAVENOUS
  Filled 2022-11-22: qty 1

## 2022-11-22 MED ORDER — FENTANYL 25 MCG/HR TD PT72: 1.0000 | MEDICATED_PATCH | TRANSDERMAL | 0 refills | Status: AC

## 2022-11-22 NOTE — Telephone Encounter (Signed)
Dr. Marin Olp notified of creat-4.14.  No new orders received at this time.

## 2022-11-22 NOTE — Patient Instructions (Signed)

## 2022-11-22 NOTE — Progress Notes (Signed)
Patient comes in today for her next treatment. Her condition continues to decline. Today she is in the wheelchair, crying and not very communicative. Family and she continue to endorse wanting to continue with treatment. Dr Marin Olp ordered CT scan due to intermittent confusion.   Oncology Nurse Navigator Documentation     11/22/2022   11:00 AM  Oncology Nurse Navigator Flowsheets  Navigator Follow Up Date: December 15, 2022  Navigator Follow Up Reason: Follow-up Appointment;Chemotherapy  Navigator Location [facility]  Navigator Encounter Type Treatment;Appt/Treatment Plan Review  Patient Visit Type MedOnc  Treatment Phase Active Tx  Barriers/Navigation Needs Coordination of Care;Education  Interventions Psycho-Social Support  Acuity Level 2-Minimal Needs (1-2 Barriers Identified)  Support Groups/Services Friends and Family  Time Spent with Patient 15

## 2022-11-22 NOTE — Progress Notes (Addendum)
Ok to treat with creatinine of 4.14 per Dr Marin Olp. Ok with HR of 138 per MD. dph

## 2022-11-22 NOTE — Progress Notes (Signed)
Hematology and Oncology Follow Up Visit  Michelle [name] [id number] 04-07-1961 62 y.o. 11/22/2022   Principle Diagnosis:  IgG Kappa myeloma -- normal cytogenetics -- dup 1p/17p-/t(14:20) Anemia of renal failure -- stage 3b  Current Therapy:   Faspro//Cytoxan/Kyprolis /Decadron -- s/p cycle #2 on 10/11/2022 XRT to lumbar spine -- completed on 06/26/2022 XRT to the skull base --started on 10/07/2022 Xgeva 120 mg sq q 3 months - next dose in 01/2023 Aranesp 300 mcg sq q month for Hgb < 11     Interim History:  Michelle [name] is back for follow-up.  She is still having some issues.  Pain seems to be the problem right now.  She is having some pain in her thighs.  I do not know if this might be from the fractures that she has.  There is also some pain in the left arm.  She still does not want to have surgery.  I offered this to her.  Actually, I think she is going to see the orthopedist in a week or so.  I think that what might be helpful is a Duragesic patch.  I think a 25 mcg patch would not be a bad idea for her.  This way, she can have a low level of pain medicine in her system all the time.  She, I do not think, I really has a problems with the chemotherapy.  Her nausea does not seem to be all that bad.  She is not having diarrhea.  There is no bleeding.  Her last Kappa light chain was 286 mg/dL.  This is down a little bit.  Thankfully, her calcium is not high.  We did give her some Xgeva last time she was here.  I know that she is trying hard.  I know that her family is trying hard.  They are really doing a good job trying to help her as much as possible.  I really have to give them a lot of credit for being so motivated.  There has been no fever.  She has had no cough.  There has been no mouth sores.  Overall, I would have to say that her performance status is probably ECOG 2-3.  Medications:  Current Outpatient Medications:    famciclovir (FAMVIR) 500 MG tablet, Take 1 tablet (500 mg  total) by mouth daily., Disp: 30 tablet, Rfl: 6   fluconazole (DIFLUCAN) 100 MG tablet, Take 100 mg by mouth daily., Disp: , Rfl:    lactulose (CHRONULAC) 10 GM/15ML solution, TAKE 15 MLS (10 G TOTAL) BY MOUTH 2 (TWO) TIMES DAILY., Disp: 1000 mL, Rfl: 4   levothyroxine (SYNTHROID) 88 MCG tablet, Take 88 mcg by mouth daily before breakfast., Disp: , Rfl:    LORazepam (ATIVAN) 1 MG tablet, Take 1 tablet (1 mg total) by mouth 2 (two) times daily as needed for anxiety., Disp: 60 tablet, Rfl: 0   megestrol (MEGACE) 400 MG/10ML suspension, Take 10 mLs (400 mg total) by mouth 2 (two) times daily., Disp: 240 mL, Rfl: 0   metoprolol tartrate (LOPRESSOR) 25 MG tablet, Take 0.5 tablets (12.5 mg total) by mouth 2 (two) times daily., Disp: 60 tablet, Rfl: 0   Multiple Vitamin (MULTIVITAMIN WITH MINERALS) TABS tablet, Take 1 tablet by mouth daily., Disp: , Rfl:    ondansetron (ZOFRAN) 8 MG tablet, Take 1 tablet (8 mg total) by mouth every 8 (eight) hours as needed for nausea or vomiting., Disp: 20 tablet, Rfl: 2   Oxycodone HCl 10 MG  TABS, Take 0.5-1 tablets (5-10 mg total) by mouth every 6 (six) hours as needed (Mod to severe pain)., Disp: 30 tablet, Rfl: 0   pantoprazole (PROTONIX) 40 MG tablet, Take 1 tablet (40 mg total) by mouth 2 (two) times daily., Disp: 60 tablet, Rfl: 6   prochlorperazine (COMPAZINE) 10 MG tablet, Take 1 tablet (10 mg total) by mouth every 6 (six) hours as needed for nausea or vomiting., Disp: 30 tablet, Rfl: 5   rosuvastatin (CRESTOR) 10 MG tablet, Take 10 mg by mouth every evening., Disp: , Rfl:    senna (SENOKOT) 8.6 MG TABS tablet, Take 1 tablet (8.6 mg total) by mouth daily as needed for mild constipation or moderate constipation., Disp: 30 tablet, Rfl: 0   temazepam (RESTORIL) 7.5 MG capsule, Take 4 capsules (30 mg total) by mouth at bedtime as needed for sleep., Disp: 30 capsule, Rfl: 0   diazepam (VALIUM) 5 MG tablet, Take thirty minutes before MRI for anxiety. (Patient not taking:  Reported on 11/22/2022), Disp: 1 tablet, Rfl: 0   melatonin 3 MG TABS tablet, Take 1 tablet (3 mg total) by mouth at bedtime as needed. (Patient not taking: Reported on 11/22/2022), Disp: 30 tablet, Rfl: 0  Allergies:  Allergies  Allergen Reactions   Penicillins Other (See Comments)    Severe Yeast infection... No deathly reactions   Codeine Nausea Only   Morphine Other (See Comments)    headache   Tramadol Other (See Comments)    Headaches     Past Medical History, Surgical history, Social history, and Family History were reviewed and updated.  Review of Systems: Review of Systems  Constitutional:  Positive for fatigue.  HENT:  Negative.    Eyes: Negative.   Respiratory: Negative.    Cardiovascular:  Positive for leg swelling.  Gastrointestinal: Negative.   Endocrine: Negative.   Genitourinary: Negative.    Musculoskeletal:  Positive for arthralgias, back pain and myalgias.  Skin: Negative.   Neurological: Negative.   Hematological: Negative.   Psychiatric/Behavioral: Negative.      Physical Exam:  oral temperature is 99.3 F (37.4 C). Her blood pressure is 126/73 and her pulse is 155 (abnormal). Her respiration is 28 (abnormal).   Wt Readings from Last 3 Encounters:  10/30/22 177 lb 7.5 oz (80.5 kg)  10/17/22 165 lb (74.8 kg)  10/11/22 162 lb (73.5 kg)    Physical Exam Vitals reviewed.  HENT:     Head: Normocephalic and atraumatic.  Eyes:     Pupils: Pupils are equal, round, and reactive to light.  Cardiovascular:     Rate and Rhythm: Normal rate and regular rhythm.     Heart sounds: Normal heart sounds.  Pulmonary:     Effort: Pulmonary effort is normal.     Breath sounds: Normal breath sounds.  Abdominal:     General: Bowel sounds are normal.     Palpations: Abdomen is soft.  Musculoskeletal:        General: No tenderness or deformity. Normal range of motion.     Cervical back: Normal range of motion.     Comments: Her lower extremities does show some  swelling bilateral.  She probably has 2+ edema bilaterally in her legs.  Lymphadenopathy:     Cervical: No cervical adenopathy.  Skin:    General: Skin is warm and dry.     Findings: No erythema or rash.  Neurological:     Mental Status: She is alert and oriented to person, place, and time.  Psychiatric:        Behavior: Behavior normal.        Thought Content: Thought content normal.        Judgment: Judgment normal.     Lab Results  Component Value Date   WBC 6.0 11/22/2022   HGB 10.3 (L) 11/22/2022   HCT 31.1 (L) 11/22/2022   MCV 85.9 11/22/2022   PLT 142 (L) 11/22/2022     Chemistry      Component Value Date/Time   NA 140 11/15/2022 1111   K 4.0 11/15/2022 1111   CL 105 11/15/2022 1111   CO2 19 (L) 11/15/2022 1111   BUN 29 (H) 11/15/2022 1111   CREATININE 3.92 (HH) 11/15/2022 1111      Component Value Date/Time   CALCIUM 11.2 (H) 11/15/2022 1111   ALKPHOS 95 11/15/2022 1111   AST 21 11/15/2022 1111   ALT 8 11/15/2022 1111   BILITOT 1.0 11/15/2022 1111      Impression and Plan: Michelle [name] is a very nice 62 year old African-American female.  She has IgG kappa myeloma.  The problem clearly is with the kappa light chains.   We will go ahead with her treatment today.  Hopefully, we will see that the Kappa light chain is a little bit better.  We will go ahead and get a CT of the brain.  I just want to make sure there is nothing that is going on that might be contributing to some of the drowsiness and occasional confusion.  Will be very interesting to see what the orthopedist has to say.  We will going give her Aranesp today.  She we will come back in 2 weeks now for her next cycle to start.  We will have to get her back sooner if she has any problems at home.  Hopefully, the Duragesic patch will be able to help with keeping her pain under good control.   Volanda Napoleon, MD 1/19/202411:21 AM

## 2022-11-22 NOTE — Addendum Note (Signed)
Addended by: Johny Drilling on: 11/22/2022 12:43 PM   Modules accepted: Orders

## 2022-11-22 NOTE — Addendum Note (Signed)
Addended by: San Morelle on: 11/22/2022 02:51 PM   Modules accepted: Orders

## 2022-11-23 LAB — IGG, IGA, IGM
IgA: <5 mg/dL — ABNORMAL LOW (ref 87–352)
IgG (Immunoglobin G), Serum: 933 mg/dL (ref 586–1602)
IgM (Immunoglobulin M), Srm: <5 mg/dL — ABNORMAL LOW (ref 26–217)

## 2022-11-25 ENCOUNTER — Emergency Department (HOSPITAL_COMMUNITY): Payer: BC Managed Care – PPO

## 2022-11-25 ENCOUNTER — Other Ambulatory Visit: Payer: Self-pay

## 2022-11-25 ENCOUNTER — Encounter: Payer: Self-pay | Admitting: *Deleted

## 2022-11-25 ENCOUNTER — Encounter (HOSPITAL_COMMUNITY): Payer: Self-pay

## 2022-11-25 ENCOUNTER — Inpatient Hospital Stay (HOSPITAL_COMMUNITY)
Admission: EM | Admit: 2022-11-25 | Discharge: 2023-01-03 | DRG: 840 | Disposition: E | Payer: BC Managed Care – PPO | Attending: Family Medicine | Admitting: Family Medicine

## 2022-11-25 DIAGNOSIS — Z841 Family history of disorders of kidney and ureter: Secondary | ICD-10-CM

## 2022-11-25 DIAGNOSIS — S42202B Unspecified fracture of upper end of left humerus, initial encounter for open fracture: Secondary | ICD-10-CM

## 2022-11-25 DIAGNOSIS — D631 Anemia in chronic kidney disease: Secondary | ICD-10-CM | POA: Diagnosis present

## 2022-11-25 DIAGNOSIS — S42302A Unspecified fracture of shaft of humerus, left arm, initial encounter for closed fracture: Secondary | ICD-10-CM | POA: Diagnosis present

## 2022-11-25 DIAGNOSIS — Z823 Family history of stroke: Secondary | ICD-10-CM

## 2022-11-25 DIAGNOSIS — I10 Essential (primary) hypertension: Secondary | ICD-10-CM | POA: Diagnosis present

## 2022-11-25 DIAGNOSIS — G9341 Metabolic encephalopathy: Secondary | ICD-10-CM | POA: Diagnosis present

## 2022-11-25 DIAGNOSIS — K219 Gastro-esophageal reflux disease without esophagitis: Secondary | ICD-10-CM | POA: Diagnosis present

## 2022-11-25 DIAGNOSIS — Z79899 Other long term (current) drug therapy: Secondary | ICD-10-CM | POA: Diagnosis not present

## 2022-11-25 DIAGNOSIS — R0989 Other specified symptoms and signs involving the circulatory and respiratory systems: Secondary | ICD-10-CM | POA: Diagnosis not present

## 2022-11-25 DIAGNOSIS — I129 Hypertensive chronic kidney disease with stage 1 through stage 4 chronic kidney disease, or unspecified chronic kidney disease: Secondary | ICD-10-CM | POA: Diagnosis present

## 2022-11-25 DIAGNOSIS — E872 Acidosis, unspecified: Secondary | ICD-10-CM | POA: Diagnosis present

## 2022-11-25 DIAGNOSIS — E8581 Light chain (AL) amyloidosis: Secondary | ICD-10-CM | POA: Diagnosis present

## 2022-11-25 DIAGNOSIS — R0600 Dyspnea, unspecified: Secondary | ICD-10-CM

## 2022-11-25 DIAGNOSIS — R4589 Other symptoms and signs involving emotional state: Secondary | ICD-10-CM

## 2022-11-25 DIAGNOSIS — Z66 Do not resuscitate: Secondary | ICD-10-CM | POA: Diagnosis present

## 2022-11-25 DIAGNOSIS — E440 Moderate protein-calorie malnutrition: Secondary | ICD-10-CM | POA: Diagnosis present

## 2022-11-25 DIAGNOSIS — C9000 Multiple myeloma not having achieved remission: Principal | ICD-10-CM | POA: Diagnosis present

## 2022-11-25 DIAGNOSIS — Z7189 Other specified counseling: Secondary | ICD-10-CM

## 2022-11-25 DIAGNOSIS — E890 Postprocedural hypothyroidism: Secondary | ICD-10-CM | POA: Diagnosis present

## 2022-11-25 DIAGNOSIS — S42202G Unspecified fracture of upper end of left humerus, subsequent encounter for fracture with delayed healing: Secondary | ICD-10-CM | POA: Diagnosis not present

## 2022-11-25 DIAGNOSIS — E039 Hypothyroidism, unspecified: Secondary | ICD-10-CM | POA: Diagnosis present

## 2022-11-25 DIAGNOSIS — R6520 Severe sepsis without septic shock: Secondary | ICD-10-CM | POA: Diagnosis not present

## 2022-11-25 DIAGNOSIS — E860 Dehydration: Secondary | ICD-10-CM | POA: Diagnosis present

## 2022-11-25 DIAGNOSIS — F419 Anxiety disorder, unspecified: Secondary | ICD-10-CM | POA: Diagnosis present

## 2022-11-25 DIAGNOSIS — M898X8 Other specified disorders of bone, other site: Secondary | ICD-10-CM | POA: Diagnosis not present

## 2022-11-25 DIAGNOSIS — M84422A Pathological fracture, left humerus, initial encounter for fracture: Secondary | ICD-10-CM | POA: Diagnosis present

## 2022-11-25 DIAGNOSIS — D696 Thrombocytopenia, unspecified: Secondary | ICD-10-CM | POA: Diagnosis present

## 2022-11-25 DIAGNOSIS — Z87891 Personal history of nicotine dependence: Secondary | ICD-10-CM

## 2022-11-25 DIAGNOSIS — R4182 Altered mental status, unspecified: Secondary | ICD-10-CM

## 2022-11-25 DIAGNOSIS — Z833 Family history of diabetes mellitus: Secondary | ICD-10-CM

## 2022-11-25 DIAGNOSIS — E669 Obesity, unspecified: Secondary | ICD-10-CM | POA: Diagnosis present

## 2022-11-25 DIAGNOSIS — E878 Other disorders of electrolyte and fluid balance, not elsewhere classified: Secondary | ICD-10-CM | POA: Diagnosis present

## 2022-11-25 DIAGNOSIS — R000 Tachycardia, unspecified: Secondary | ICD-10-CM | POA: Diagnosis present

## 2022-11-25 DIAGNOSIS — N179 Acute kidney failure, unspecified: Secondary | ICD-10-CM | POA: Diagnosis present

## 2022-11-25 DIAGNOSIS — Y92013 Bedroom of single-family (private) house as the place of occurrence of the external cause: Secondary | ICD-10-CM

## 2022-11-25 DIAGNOSIS — J189 Pneumonia, unspecified organism: Secondary | ICD-10-CM | POA: Diagnosis present

## 2022-11-25 DIAGNOSIS — Z79890 Hormone replacement therapy (postmenopausal): Secondary | ICD-10-CM

## 2022-11-25 DIAGNOSIS — Z8249 Family history of ischemic heart disease and other diseases of the circulatory system: Secondary | ICD-10-CM

## 2022-11-25 DIAGNOSIS — Z993 Dependence on wheelchair: Secondary | ICD-10-CM

## 2022-11-25 DIAGNOSIS — Z515 Encounter for palliative care: Secondary | ICD-10-CM | POA: Diagnosis not present

## 2022-11-25 DIAGNOSIS — Z1152 Encounter for screening for COVID-19: Secondary | ICD-10-CM

## 2022-11-25 DIAGNOSIS — N390 Urinary tract infection, site not specified: Secondary | ICD-10-CM | POA: Diagnosis present

## 2022-11-25 DIAGNOSIS — E870 Hyperosmolality and hypernatremia: Secondary | ICD-10-CM | POA: Diagnosis not present

## 2022-11-25 DIAGNOSIS — E7800 Pure hypercholesterolemia, unspecified: Secondary | ICD-10-CM | POA: Diagnosis present

## 2022-11-25 DIAGNOSIS — Z880 Allergy status to penicillin: Secondary | ICD-10-CM

## 2022-11-25 DIAGNOSIS — R296 Repeated falls: Secondary | ICD-10-CM | POA: Diagnosis present

## 2022-11-25 DIAGNOSIS — J910 Malignant pleural effusion: Secondary | ICD-10-CM | POA: Diagnosis present

## 2022-11-25 DIAGNOSIS — R627 Adult failure to thrive: Secondary | ICD-10-CM | POA: Diagnosis not present

## 2022-11-25 DIAGNOSIS — N1832 Chronic kidney disease, stage 3b: Secondary | ICD-10-CM

## 2022-11-25 DIAGNOSIS — Z885 Allergy status to narcotic agent status: Secondary | ICD-10-CM

## 2022-11-25 DIAGNOSIS — W19XXXA Unspecified fall, initial encounter: Secondary | ICD-10-CM | POA: Diagnosis present

## 2022-11-25 DIAGNOSIS — R52 Pain, unspecified: Secondary | ICD-10-CM | POA: Diagnosis present

## 2022-11-25 DIAGNOSIS — Y92009 Unspecified place in unspecified non-institutional (private) residence as the place of occurrence of the external cause: Secondary | ICD-10-CM

## 2022-11-25 DIAGNOSIS — E785 Hyperlipidemia, unspecified: Secondary | ICD-10-CM | POA: Diagnosis present

## 2022-11-25 DIAGNOSIS — N3000 Acute cystitis without hematuria: Principal | ICD-10-CM

## 2022-11-25 DIAGNOSIS — N184 Chronic kidney disease, stage 4 (severe): Secondary | ICD-10-CM | POA: Diagnosis present

## 2022-11-25 DIAGNOSIS — Z801 Family history of malignant neoplasm of trachea, bronchus and lung: Secondary | ICD-10-CM

## 2022-11-25 DIAGNOSIS — Z6827 Body mass index (BMI) 27.0-27.9, adult: Secondary | ICD-10-CM

## 2022-11-25 DIAGNOSIS — A419 Sepsis, unspecified organism: Secondary | ICD-10-CM | POA: Diagnosis present

## 2022-11-25 LAB — LACTIC ACID, PLASMA
Lactic Acid, Venous: 1.4 mmol/L (ref 0.5–1.9)
Lactic Acid, Venous: 1.6 mmol/L (ref 0.5–1.9)

## 2022-11-25 LAB — CBC WITH DIFFERENTIAL/PLATELET
Abs Immature Granulocytes: 0.04 10*3/uL (ref 0.00–0.07)
Basophils Absolute: 0 10*3/uL (ref 0.0–0.1)
Basophils Relative: 0 %
Eosinophils Absolute: 0 10*3/uL (ref 0.0–0.5)
Eosinophils Relative: 0 %
HCT: 28.1 % — ABNORMAL LOW (ref 36.0–46.0)
Hemoglobin: 9 g/dL — ABNORMAL LOW (ref 12.0–15.0)
Immature Granulocytes: 1 %
Lymphocytes Relative: 5 %
Lymphs Abs: 0.3 10*3/uL — ABNORMAL LOW (ref 0.7–4.0)
MCH: 28.3 pg (ref 26.0–34.0)
MCHC: 32 g/dL (ref 30.0–36.0)
MCV: 88.4 fL (ref 80.0–100.0)
Monocytes Absolute: 1.8 10*3/uL — ABNORMAL HIGH (ref 0.1–1.0)
Monocytes Relative: 27 %
Neutro Abs: 4.5 10*3/uL (ref 1.7–7.7)
Neutrophils Relative %: 67 %
Platelets: 100 10*3/uL — ABNORMAL LOW (ref 150–400)
RBC: 3.18 MIL/uL — ABNORMAL LOW (ref 3.87–5.11)
RDW: 22.1 % — ABNORMAL HIGH (ref 11.5–15.5)
WBC: 6.7 10*3/uL (ref 4.0–10.5)
nRBC: 1.2 % — ABNORMAL HIGH (ref 0.0–0.2)

## 2022-11-25 LAB — RETICULOCYTES
Immature Retic Fract: 15.6 % (ref 2.3–15.9)
RBC.: 3.18 MIL/uL — ABNORMAL LOW (ref 3.87–5.11)
Retic Count, Absolute: 15.6 10*3/uL — ABNORMAL LOW (ref 19.0–186.0)
Retic Ct Pct: 0.5 % (ref 0.4–3.1)

## 2022-11-25 LAB — COMPREHENSIVE METABOLIC PANEL
ALT: 13 U/L (ref 0–44)
AST: 23 U/L (ref 15–41)
Albumin: 3 g/dL — ABNORMAL LOW (ref 3.5–5.0)
Alkaline Phosphatase: 85 U/L (ref 38–126)
Anion gap: 10 (ref 5–15)
BUN: 42 mg/dL — ABNORMAL HIGH (ref 8–23)
CO2: 15 mmol/L — ABNORMAL LOW (ref 22–32)
Calcium: 6.7 mg/dL — ABNORMAL LOW (ref 8.9–10.3)
Chloride: 117 mmol/L — ABNORMAL HIGH (ref 98–111)
Creatinine, Ser: 3.74 mg/dL — ABNORMAL HIGH (ref 0.44–1.00)
GFR, Estimated: 13 mL/min — ABNORMAL LOW (ref >60)
Glucose, Bld: 83 mg/dL (ref 70–99)
Potassium: 3.7 mmol/L (ref 3.5–5.1)
Sodium: 142 mmol/L (ref 135–145)
Total Bilirubin: 0.9 mg/dL (ref 0.3–1.2)
Total Protein: 5.8 g/dL — ABNORMAL LOW (ref 6.5–8.1)

## 2022-11-25 LAB — URINALYSIS, ROUTINE W REFLEX MICROSCOPIC
Bilirubin Urine: NEGATIVE
Glucose, UA: NEGATIVE mg/dL
Ketones, ur: NEGATIVE mg/dL
Nitrite: NEGATIVE
Protein, ur: 100 mg/dL — AB
Specific Gravity, Urine: 1.011 (ref 1.005–1.030)
WBC, UA: >50 WBC/hpf — ABNORMAL HIGH (ref 0–5)
pH: 6 (ref 5.0–8.0)

## 2022-11-25 LAB — RESP PANEL BY RT-PCR (RSV, FLU A&B, COVID)  RVPGX2
Influenza A by PCR: NEGATIVE
Influenza B by PCR: NEGATIVE
Resp Syncytial Virus by PCR: NEGATIVE
SARS Coronavirus 2 by RT PCR: NEGATIVE

## 2022-11-25 LAB — PROTIME-INR
INR: 1.3 — ABNORMAL HIGH (ref 0.8–1.2)
Prothrombin Time: 16.1 seconds — ABNORMAL HIGH (ref 11.4–15.2)

## 2022-11-25 LAB — KAPPA/LAMBDA LIGHT CHAINS
Kappa free light chain: 2404.7 mg/L — ABNORMAL HIGH (ref 3.3–19.4)
Kappa, lambda light chain ratio: 1093.05 — ABNORMAL HIGH (ref 0.26–1.65)
Lambda free light chains: 2.2 mg/L — ABNORMAL LOW (ref 5.7–26.3)

## 2022-11-25 LAB — APTT: aPTT: 36 seconds (ref 24–36)

## 2022-11-25 MED ORDER — HYDROMORPHONE HCL 1 MG/ML IJ SOLN
0.5000 mg | INTRAMUSCULAR | Status: DC | PRN
  Administered 2022-11-26 (×2): 1 mg via INTRAVENOUS
  Filled 2022-11-25 (×2): qty 1

## 2022-11-25 MED ORDER — LEVOTHYROXINE SODIUM 88 MCG PO TABS: 88.0000 ug | ORAL_TABLET | Freq: Every day | ORAL | Status: DC

## 2022-11-25 MED ORDER — SODIUM CHLORIDE 0.9 % IV BOLUS
1000.0000 mL | Freq: Once | INTRAVENOUS | Status: AC
  Administered 2022-11-25: 1000 mL via INTRAVENOUS

## 2022-11-25 MED ORDER — ACETAMINOPHEN 650 MG RE SUPP
650.0000 mg | Freq: Four times a day (QID) | RECTAL | Status: DC | PRN
  Administered 2022-11-27: 650 mg via RECTAL
  Filled 2022-11-25: qty 1

## 2022-11-25 MED ORDER — HYDROCODONE-ACETAMINOPHEN 5-325 MG PO TABS: 1.0000 | ORAL_TABLET | ORAL | Status: DC | PRN

## 2022-11-25 MED ORDER — SODIUM CHLORIDE 0.9 % IV SOLN
1.0000 g | INTRAVENOUS | Status: DC
  Administered 2022-11-26 – 2022-11-27 (×2): 1 g via INTRAVENOUS
  Filled 2022-11-25 (×2): qty 10

## 2022-11-25 MED ORDER — LORAZEPAM 2 MG/ML IJ SOLN
1.0000 mg | Freq: Once | INTRAMUSCULAR | Status: AC
  Administered 2022-11-25: 1 mg via INTRAVENOUS
  Filled 2022-11-25: qty 1

## 2022-11-25 MED ORDER — FENTANYL 25 MCG/HR TD PT72
1.0000 | MEDICATED_PATCH | TRANSDERMAL | Status: DC
  Administered 2022-11-25: 1 via TRANSDERMAL
  Filled 2022-11-25: qty 1

## 2022-11-25 MED ORDER — CALCIUM GLUCONATE-NACL 1-0.675 GM/50ML-% IV SOLN
1.0000 g | Freq: Once | INTRAVENOUS | Status: AC
  Administered 2022-11-25: 1000 mg via INTRAVENOUS
  Filled 2022-11-25: qty 50

## 2022-11-25 MED ORDER — ACETAMINOPHEN 325 MG PO TABS
650.0000 mg | ORAL_TABLET | Freq: Once | ORAL | Status: AC
  Administered 2022-11-25: 650 mg via ORAL
  Filled 2022-11-25: qty 2

## 2022-11-25 MED ORDER — SODIUM CHLORIDE 0.9 % IV BOLUS (SEPSIS)
1000.0000 mL | Freq: Once | INTRAVENOUS | Status: AC
  Administered 2022-11-25: 1000 mL via INTRAVENOUS

## 2022-11-25 MED ORDER — HALOPERIDOL LACTATE 5 MG/ML IJ SOLN: 2.0000 mg | Freq: Once | INTRAMUSCULAR | Status: DC

## 2022-11-25 MED ORDER — ACETAMINOPHEN 325 MG PO TABS: 650.0000 mg | ORAL_TABLET | Freq: Four times a day (QID) | ORAL | Status: DC | PRN

## 2022-11-25 MED ORDER — SODIUM CHLORIDE 0.9 % IV SOLN
1.0000 g | Freq: Once | INTRAVENOUS | Status: AC
  Administered 2022-11-25: 1 g via INTRAVENOUS
  Filled 2022-11-25: qty 10

## 2022-11-25 MED ORDER — DIAZEPAM 5 MG/ML IJ SOLN: 2.5000 mg | Freq: Once | INTRAMUSCULAR | Status: DC

## 2022-11-25 MED ORDER — METOPROLOL TARTRATE 5 MG/5ML IV SOLN
2.5000 mg | Freq: Four times a day (QID) | INTRAVENOUS | Status: DC
  Administered 2022-11-25 – 2022-11-30 (×18): 2.5 mg via INTRAVENOUS
  Filled 2022-11-25 (×16): qty 5

## 2022-11-25 MED ORDER — HALOPERIDOL LACTATE 5 MG/ML IJ SOLN
5.0000 mg | Freq: Once | INTRAMUSCULAR | Status: AC
  Administered 2022-11-25: 5 mg via INTRAVENOUS
  Filled 2022-11-25: qty 1

## 2022-11-25 MED ORDER — SODIUM CHLORIDE 0.9 % IV SOLN: INTRAVENOUS | Status: DC

## 2022-11-25 MED ORDER — FENTANYL CITRATE PF 50 MCG/ML IJ SOSY: 50.0000 ug | PREFILLED_SYRINGE | Freq: Once | INTRAMUSCULAR | Status: DC

## 2022-11-25 NOTE — ED Provider Notes (Signed)
EMERGENCY DEPARTMENT AT [facility] Provider Note   CSN: [id number] Arrival date & time: 12/04/2022  8563     History  Chief Complaint  Patient presents with   Altered Mental Status    TELISSA PALMISANO is a 62 y.o. female.  Patient here with altered mental status from home.  Level 5 caveat as patient seems to be confused.  She will tell me her name but is hard to redirect.  EMS got called out because she has had some falls recently.  She has a history of myeloma.  Confusions been on and off for a while.  They are not sure of any illness or fever per EMS.  Unable to get in touch with family by phone.  The history is provided by the patient and the EMS personnel.       Home Medications Prior to Admission medications   Medication Sig Start Date End Date Taking? Authorizing Provider  diazepam (VALIUM) 5 MG tablet Take thirty minutes before MRI for anxiety. Patient not taking: Reported on 11/22/2022 09/12/22   Volanda Napoleon, MD  famciclovir (FAMVIR) 500 MG tablet Take 1 tablet (500 mg total) by mouth daily. 10/11/22   Volanda Napoleon, MD  fentaNYL (DURAGESIC) 25 MCG/HR Place 1 patch onto the skin every 3 (three) days. 11/22/22   Volanda Napoleon, MD  fluconazole (DIFLUCAN) 100 MG tablet Take 100 mg by mouth daily. 07/28/22   [provider]  lactulose (CHRONULAC) 10 GM/15ML solution TAKE 15 MLS (10 G TOTAL) BY MOUTH 2 (TWO) TIMES DAILY. 10/15/22   Volanda Napoleon, MD  levothyroxine (SYNTHROID) 88 MCG tablet Take 88 mcg by mouth daily before breakfast.    [provider]  LORazepam (ATIVAN) 1 MG tablet Take 1 tablet (1 mg total) by mouth 2 (two) times daily as needed for anxiety. 11/19/22   Volanda Napoleon, MD  megestrol (MEGACE) 400 MG/10ML suspension Take 10 mLs (400 mg total) by mouth 2 (two) times daily. 10/29/22   Amin, Jeanella Flattery, MD  melatonin 3 MG TABS tablet Take 1 tablet (3 mg total) by mouth at bedtime as needed. Patient not taking:  Reported on 11/22/2022 10/29/22   Damita Lack, MD  metoprolol tartrate (LOPRESSOR) 25 MG tablet Take 0.5 tablets (12.5 mg total) by mouth 2 (two) times daily. 10/29/22   Amin, Jeanella Flattery, MD  Multiple Vitamin (MULTIVITAMIN WITH MINERALS) TABS tablet Take 1 tablet by mouth daily. 06/27/22   Hongalgi, Lenis Dickinson, MD  ondansetron (ZOFRAN) 8 MG tablet Take 1 tablet (8 mg total) by mouth every 8 (eight) hours as needed for nausea or vomiting. 10/10/22   Volanda Napoleon, MD  Oxycodone HCl 10 MG TABS Take 0.5-1 tablets (5-10 mg total) by mouth every 6 (six) hours as needed (Mod to severe pain). 10/29/22   Amin, Jeanella Flattery, MD  pantoprazole (PROTONIX) 40 MG tablet Take 1 tablet (40 mg total) by mouth 2 (two) times daily. 10/04/22   Volanda Napoleon, MD  prochlorperazine (COMPAZINE) 10 MG tablet Take 1 tablet (10 mg total) by mouth every 6 (six) hours as needed for nausea or vomiting. 06/14/22   Tyler Pita, MD  rosuvastatin (CRESTOR) 10 MG tablet Take 10 mg by mouth every evening.    [provider]  senna (SENOKOT) 8.6 MG TABS tablet Take 1 tablet (8.6 mg total) by mouth daily as needed for mild constipation or moderate constipation. 06/26/22   Hongalgi, Lenis Dickinson, MD  temazepam (  RESTORIL) 7.5 MG capsule Take 4 capsules (30 mg total) by mouth at bedtime as needed for sleep. 11/15/22   Volanda Napoleon, MD      Allergies    Penicillins, Codeine, Morphine, and Tramadol    Review of Systems   Review of Systems  Physical Exam Updated Vital Signs BP (!) 147/85   Pulse (!) 156   Temp 99.7 F (37.6 C) (Oral)   Resp (!) 31   SpO2 98%  Physical Exam Vitals and nursing note reviewed.  Constitutional:      General: She is not in acute distress.    Appearance: She is well-developed. She is ill-appearing.  HENT:     Head: Normocephalic and atraumatic.     Mouth/Throat:     Mouth: Mucous membranes are dry.  Eyes:     Extraocular Movements: Extraocular movements intact.      Conjunctiva/sclera: Conjunctivae normal.     Pupils: Pupils are equal, round, and reactive to light.  Cardiovascular:     Rate and Rhythm: Normal rate and regular rhythm.     Pulses: Normal pulses.     Heart sounds: Normal heart sounds. No murmur heard. Pulmonary:     Effort: Pulmonary effort is normal. No respiratory distress.     Breath sounds: Normal breath sounds.  Abdominal:     Palpations: Abdomen is soft.     Tenderness: There is no abdominal tenderness.  Musculoskeletal:        General: No swelling or tenderness.     Cervical back: Neck supple.     Comments: No real focal tenderness on exam, she is in a sling in the left upper extremity  Skin:    General: Skin is warm and dry.     Capillary Refill: Capillary refill takes less than 2 seconds.  Neurological:     General: No focal deficit present.     Mental Status: She is alert.     Comments: She appears to move all extremities, and tell me her name when I asked care but otherwise very hard to redirect to get her to follow commands.  Psychiatric:        Mood and Affect: Mood normal.     ED Results / Procedures / Treatments   Labs (all labs ordered are listed, but only abnormal results are displayed) Labs Reviewed  COMPREHENSIVE METABOLIC PANEL - Abnormal; Notable for the following components:      Result Value   Chloride 117 (*)    CO2 15 (*)    BUN 42 (*)    Creatinine, Ser 3.74 (*)    Calcium 6.7 (*)    Total Protein 5.8 (*)    Albumin 3.0 (*)    GFR, Estimated 13 (*)    All other components within normal limits  CBC WITH DIFFERENTIAL/PLATELET - Abnormal; Notable for the following components:   RBC 3.18 (*)    Hemoglobin 9.0 (*)    HCT 28.1 (*)    RDW 22.1 (*)    Platelets 100 (*)    nRBC 1.2 (*)    Lymphs Abs 0.3 (*)    Monocytes Absolute 1.8 (*)    All other components within normal limits  PROTIME-INR - Abnormal; Notable for the following components:   Prothrombin Time 16.1 (*)    INR 1.3 (*)    All  other components within normal limits  URINALYSIS, ROUTINE W REFLEX MICROSCOPIC - Abnormal; Notable for the following components:   APPearance CLOUDY (*)  Hgb urine dipstick SMALL (*)    Protein, ur 100 (*)    Leukocytes,Ua LARGE (*)    WBC, UA >50 (*)    Bacteria, UA MANY (*)    Non Squamous Epithelial 0-5 (*)    All other components within normal limits  RESP PANEL BY RT-PCR (RSV, FLU A&B, COVID)  RVPGX2  CULTURE, BLOOD (ROUTINE X 2)  CULTURE, BLOOD (ROUTINE X 2)  URINE CULTURE  LACTIC ACID, PLASMA  APTT  LACTIC ACID, PLASMA    EKG EKG Interpretation  Date/Time:  Monday November 25 2022 19:23:46 EST Ventricular Rate:  134 PR Interval:  121 QRS Duration: 78 QT Interval:  324 QTC Calculation: 484 R Axis:   54 Text Interpretation: Sinus tachycardia Confirmed by Lennice Sites ([id number]) on 11/08/2022 8:34:32 PM  Radiology DG Chest Portable 1 View  Result Date: 11/20/2022 CLINICAL DATA:  Altered mental status EXAM: PORTABLE CHEST 1 VIEW COMPARISON:  Chest x-ray dated October 17, 2022 FINDINGS: Cardiac and mediastinal contours are unchanged. Elevation of the right hemidiaphragm. increased heterogeneous opacities of the right hemithorax. Right chest wall masses, are similar to prior exam. IMPRESSION: Increased heterogeneous opacities of the right hemithorax, possibly due to atelectasis, asymmetric pulmonary edema or infection. Electronically Signed   By: Yetta Glassman M.D.   On: 11/09/2022 20:14   DG Pelvis Portable  Result Date: 11/18/2022 CLINICAL DATA:  Altered mental status EXAM: PORTABLE PELVIS 1-2 VIEWS COMPARISON:  10/22/2022, CT 07/18/2022 FINDINGS: SI joints show mild degenerative change. Pubic symphysis appears intact. Heterogeneous lytic lesion at the lesser trochanter of left femur. No dislocation. Additional lytic lesions are faintly visible, corresponding to the lytic lesions on CT. IMPRESSION: Heterogeneous lytic lesion at the lesser trochanter of the left with  pathologic fracture as before. Additional lytic lesions seen on prior CT are also faintly visible. Electronically Signed   By: Donavan Foil M.D.   On: 11/24/2022 20:10    Procedures Procedures    Medications Ordered in ED Medications  cefTRIAXone (ROCEPHIN) 1 g in sodium chloride 0.9 % 100 mL IVPB (1 g Intravenous New Bag/Given 11/15/2022 2200)  sodium chloride 0.9 % bolus 1,000 mL (0 mLs Intravenous Stopped 12/03/2022 2030)  acetaminophen (TYLENOL) tablet 650 mg (650 mg Oral Given 11/28/2022 1850)  LORazepam (ATIVAN) injection 1 mg (1 mg Intravenous Given 11/30/2022 1850)  haloperidol lactate (HALDOL) injection 5 mg (5 mg Intravenous Given 11/05/2022 2157)    ED Course/ Medical Decision Making/ A&P                             Medical Decision Making Amount and/or Complexity of Data Reviewed Labs: ordered. Radiology: ordered.  Risk OTC drugs. Prescription drug management. Decision regarding hospitalization.   LYNN RECENDIZ is here with altered mental status, fall.  History of multiple myeloma on infusions, hypertension, high cholesterol.  History is limited as I am unable to talk with family right now.  Patient can tell me her name when she is asked but she is not really redirectable.  I was able to read her oncology note from 3 days ago and sounds like she has been dealing with some episodes of confusion.  She had outpatient head CT 3 days ago that was unremarkable.  She arrives with tachycardia in the 140s, rectal temp 99.7.  But vitals otherwise unremarkable.  Reading oncology noted does seem to be some failure to thrive.  Differential includes infectious process versus electrolyte abnormality including hypercalcemia  versus dehydration versus possibly some traumatic process.  Does not appear that she is on blood thinners.  She is on multiple sedating medications as well.  Do not see any obvious pain patch on her now.  She supposedly fell out of bed tonight.  She is already in a sling in her left  upper arm.  He does not appear to be having any discomfort on exam focally.  Will get a head and neck CT chest x-ray and pelvic x-ray to evaluate.  Will pursue infectious workup with blood cultures, urinalysis, chest x-ray, labs including lactic acid, CBC.  Will try to talk with family when I am able to find them.  I anticipate admission.  Given that she is very agitated on exam we will give her dose of Ativan and will start some IV fluids.  Will hold antibiotics at this time as she does not have a true fever and have no source for infection at this time.  Will get a COVID and flu test as well.  Per my review and interpretation of labs patient positive for urinary tract infection.  Chest x-ray with no acute findings.  I have low suspicion for pneumonia.  No new traumatic findings on chest x-ray pelvic x-ray per radiology report.  Blood work per my review and interpretation overall unremarkable with no significant leukocytosis or lactic acidosis.  I do not suspect sepsis.  But she is tachycardic and does have borderline fever.  Possible that this could be.  Blood cultures have been collected.  But at this time my suspicion is that she is little encephalopathic from a UTI and that she is chronically ill at baseline.  There could be some medication side effect going on as well.  She has been very agitated since being here.  I did give her IM Haldol to help keep her calm.  Tachycardia improved when she is resting.  Overall head CT unremarkable.  This chart was dictated using voice recognition software.  Despite best efforts to proofread,  errors can occur which can change the documentation meaning.         Final Clinical Impression(s) / ED Diagnoses Final diagnoses:  Acute cystitis without hematuria  Altered mental status, unspecified altered mental status type  Failure to thrive in adult    Rx / DC Orders ED Discharge Orders     None         Lennice Sites, DO 11/08/2022 2216

## 2022-11-25 NOTE — Assessment & Plan Note (Signed)
-  treat with Rocephin   °      await results of urine culture and adjust antibiotic coverage as needed ° °

## 2022-11-25 NOTE — H&P (Signed)
Michelle [name] KXF:[id number] DOB: 15-Jan-1961 DOA: 11/30/2022     PCP: Leeroy Cha, MD   Outpatient Specialists:       Pulmonary  Dr. Volanda Napoleon PA  Oncology   Dr. Marin Olp    Patient arrived to ER on 11/17/2022 at 1822 Referred by Attending Lennice Sites, DO   Patient coming from:    home Lives     With family    Chief Complaint:   Chief Complaint  Patient presents with   Altered Mental Status    HPI: Michelle [name] is a 62 y.o. female with medical history significant of multiple myeloma hypothyroidism hypertension GERD HLD, malignant pleural effusion, multiple pathologic fractues    Presented with confusion and falls Comes back from home with confusion for past 1 week has known history of multiple myeloma for which she has been getting chemotherapy every Friday.  She had a fall tonight and was found on the carpet patient does not take any blood thinners Head CT scan 3 days ago was unremarkable On-call this is aware of patient progressive confusion. In the emergency department she required IV Ativan and then IM Haldol CT head unremarkable UA worrisome for UTI   Last chemotherapy was on 19 January She was prescribed Duragesic patch although it is not clear if she actually been using it she has not gotten it yet Family gives her ativan bid Able to transfer to commode The confusion has been progressive over 2 wk Urine was stronger smelling Family states her HR has been in 130-140 for a long time   Initial COVID TEST  NEGATIVE   Lab Results  Component Value Date   [place] NEGATIVE 11/05/2022    Regarding pertinent Chronic problems:     Hyperlipidemia -  on statins Crestor Lipid Panel     Component Value Date/Time   CHOL 235 (H) 10/15/2010 2018   TRIG 106 10/15/2010 2018   HDL 56 10/15/2010 2018   CHOLHDL 4.2 Ratio 10/15/2010 2018   VLDL 21 10/15/2010 2018   Glide 158 (H) 10/15/2010 2018     HTN on lopressor     Hypothyroidism:  Lab Results   Component Value Date   TSH 2.908 06/18/2022   on synthroid   obesity-   BMI Readings from Last 1 Encounters:  11/19/22 30.46 kg/m       CKD stage IV- baseline Cr 4.0 CrCl cannot be calculated (Unknown ideal weight.).  Lab Results  Component Value Date   CREATININE 3.74 (H) 11/20/2022   CREATININE 4.14 (HH) 11/22/2022   CREATININE 3.92 (HH) 11/15/2022     Chronic anemia - baseline hg Hemoglobin & Hematocrit  Recent Labs    11/15/22 1111 11/22/22 1053 11/19/2022 1946  HGB 11.7* 10.3* 9.0*    While in ER:      CT HEAD No acute intracranial abnormality. Stable osteolytic lesions compatible with patient's history of myeloma.  CTNeck 1. No acute fracture or malalignment of the cervical spine. 2. Multiple lytic lesions within the vertebra and left skull base consistent with history of multiple myeloma. 3. Partially visualized right pleural effusion. Suspected right supraclavicular region nodes or masses measuring up to 2.5 cm.    CXR - Increased heterogeneous opacities of the right hemithorax,   Pelvis  Heterogeneous lytic lesion at the lesser trochanter of the left with pathologic fracture as before. Additional lytic lesions seen on prior CT are also faintly visible. Following Medications were ordered in ER: Medications  sodium chloride 0.9 % bolus  1,000 mL (0 mLs Intravenous Stopped 12/04/2022 2030)  acetaminophen (TYLENOL) tablet 650 mg (650 mg Oral Given 11/05/2022 1850)  LORazepam (ATIVAN) injection 1 mg (1 mg Intravenous Given 11/04/2022 1850)  cefTRIAXone (ROCEPHIN) 1 g in sodium chloride 0.9 % 100 mL IVPB (1 g Intravenous New Bag/Given 11/28/2022 2200)  haloperidol lactate (HALDOL) injection 5 mg (5 mg Intravenous Given 11/04/2022 2157)       ED Triage Vitals  Enc Vitals Group     BP 11/16/2022 1846 139/88     Pulse Rate 11/18/2022 1846 (!) 148     Resp 11/24/2022 1846 20     Temp 11/11/2022 1838 99.7 F (37.6 C)     Temp Source 11/06/2022 1838 Oral     SpO2 11/11/2022 1846 100 %      Weight --      Height --      Head Circumference --      Peak Flow --      Pain Score 11/12/2022 1842 0     Pain Loc --      Pain Edu? --      Excl. in [place]? --   TMAX(24)@     _________________________________________ Significant initial  Findings: Abnormal Labs Reviewed  COMPREHENSIVE METABOLIC PANEL - Abnormal; Notable for the following components:      Result Value   Chloride 117 (*)    CO2 15 (*)    BUN 42 (*)    Creatinine, Ser 3.74 (*)    Calcium 6.7 (*)    Total Protein 5.8 (*)    Albumin 3.0 (*)    GFR, Estimated 13 (*)    All other components within normal limits  CBC WITH DIFFERENTIAL/PLATELET - Abnormal; Notable for the following components:   RBC 3.18 (*)    Hemoglobin 9.0 (*)    HCT 28.1 (*)    RDW 22.1 (*)    Platelets 100 (*)    nRBC 1.2 (*)    Lymphs Abs 0.3 (*)    Monocytes Absolute 1.8 (*)    All other components within normal limits  PROTIME-INR - Abnormal; Notable for the following components:   Prothrombin Time 16.1 (*)    INR 1.3 (*)    All other components within normal limits  URINALYSIS, ROUTINE W REFLEX MICROSCOPIC - Abnormal; Notable for the following components:   APPearance CLOUDY (*)    Hgb urine dipstick SMALL (*)    Protein, ur 100 (*)    Leukocytes,Ua LARGE (*)    WBC, UA >50 (*)    Bacteria, UA MANY (*)    Non Squamous Epithelial 0-5 (*)    All other components within normal limits     ECG: Ordered Personally reviewed and interpreted by me showing: HR :  129 Rhythm:  Sinus tachycardia    nonspecific changes,  QTC 463   ____________________ This patient meets SIRS Criteria and may be septic.   The recent clinical data is shown below. Vitals:   11/24/2022 2010 12/03/2022 2030 11/20/2022 2200 11/30/2022 2233  BP: 139/85 98/61 (!) 147/85   Pulse: (!) 142 (!) 129 (!) 156 (!) 136  Resp: (!) 25 20 (!) 31 (!) 23  Temp:      TempSrc:      SpO2: 100% 100% 98% 99%    WBC     Component Value Date/Time   WBC 6.7 11/09/2022 1946    LYMPHSABS 0.3 (L) 11/27/2022 1946   MONOABS 1.8 (H) 11/07/2022 1946   EOSABS 0.0 11/24/2022  1946   BASOSABS 0.0 11/04/2022 1946    Lactic Acid, Venous    Component Value Date/Time   LATICACIDVEN 1.6 11/21/2022 2156     Procalcitonin   Ordered     UA possible UTI      Urine analysis:    Component Value Date/Time   COLORURINE YELLOW 11/22/2022 1841   APPEARANCEUR CLOUDY (A) 11/15/2022 1841   LABSPEC 1.011 11/11/2022 1841   PHURINE 6.0 11/29/2022 1841   GLUCOSEU NEGATIVE 11/21/2022 1841   HGBUR SMALL (A) 12/04/2022 1841   BILIRUBINUR NEGATIVE 11/16/2022 1841   KETONESUR NEGATIVE 11/16/2022 1841   PROTEINUR 100 (A) 11/30/2022 1841   UROBILINOGEN 0.2 05/01/2010 0830   NITRITE NEGATIVE 11/15/2022 1841   LEUKOCYTESUR LARGE (A) 11/23/2022 1841    Results for orders placed or performed during the hospital encounter of 11/15/2022  Resp panel by RT-PCR (RSV, Flu A&B, Covid) Anterior Nasal Swab     Status: None   Collection Time: 12/01/2022  6:47 PM   Specimen: Anterior Nasal Swab  Result Value Ref Range Status   SARS Coronavirus 2 by RT PCR NEGATIVE NEGATIVE Final         Influenza A by PCR NEGATIVE NEGATIVE Final   Influenza B by PCR NEGATIVE NEGATIVE Final         Resp Syncytial Virus by PCR NEGATIVE NEGATIVE Final          _______________________________________________ Hospitalist was called for admission for   Acute cystitis without hematuria Acute encephalopathy progressive multiple myeloma    The following Work up has been ordered so far:  Orders Placed This Encounter  Procedures   Blood Culture (routine x 2)   Urine Culture   Resp panel by RT-PCR (RSV, Flu A&B, Covid) Anterior Nasal Swab   DG Chest Portable 1 View   CT Head Wo Contrast   CT Cervical Spine Wo Contrast   DG Pelvis Portable   Lactic acid, plasma   Comprehensive metabolic panel   CBC with Differential   Protime-INR   APTT   Urinalysis, Routine w reflex microscopic   Diet NPO time specified    Check Rectal Temperature   Cardiac monitoring   Document height and weight   Assess and Document Glasgow Coma Scale   Document vital signs within 1-hour of fluid bolus completion.  Notify provider of abnormal vital signs despite fluid resuscitation.   Refer to Sidebar Report: Sepsis Bundle ED/IP   Notify provider for difficulties obtaining IV access   Initiate [place] Fluid Protocol   In and Out Cath   Consult to hospitalist   Pulse oximetry, continuous   ED EKG   Insert peripheral IV X 1     OTHER Significant initial  Findings:  labs showing:    Recent Labs  Lab 11/22/22 1053 11/10/2022 1946  NA 141 142  K 3.8 3.7  CO2 19* 15*  GLUCOSE 95 83  BUN 37* 42*  CREATININE 4.14* 3.74*  CALCIUM 8.4* 6.7*    Cr  * stable,  Up from baseline see below Lab Results  Component Value Date   CREATININE 3.74 (H) 11/21/2022   CREATININE 4.14 (HH) 11/22/2022   CREATININE 3.92 (HH) 11/15/2022    Recent Labs  Lab 11/22/22 1053 11/20/2022 1946  AST 24 23  ALT 9 13  ALKPHOS 106 85  BILITOT 1.1 0.9  PROT 6.6 5.8*  ALBUMIN 3.8 3.0*   Lab Results  Component Value Date   CALCIUM 6.7 (L) 11/09/2022   PHOS 5.4 (H)  10/22/2022          Plt: Lab Results  Component Value Date   PLT 100 (L) 11/14/2022       COVID-19 Labs  No results for input(s): "DDIMER", "FERRITIN", "LDH", "CRP" in the last 72 hours.  Lab Results  Component Value Date   SARSCOV2NAA NEGATIVE 12/04/2022     Venous  Blood Gas result:  pH   7.38 Acid-base deficit 7.6 High  mmol/L   pCO2, Ven 27 Low  mmHg O2 Saturation 85.7 %  pO2, Ven 50 High  mmHg        Recent Labs  Lab 11/22/22 1053 11/17/2022 1946  WBC 6.0 6.7  NEUTROABS 3.3 4.5  HGB 10.3* 9.0*  HCT 31.1* 28.1*  MCV 85.9 88.4  PLT 142* 100*    HG/HCT   stable,       Component Value Date/Time   HGB 9.0 (L) 12/03/2022 1946   HGB 10.3 (L) 11/22/2022 1053   HCT 28.1 (L) 11/10/2022 1946   MCV 88.4 11/22/2022 1946         Cultures:     Component Value Date/Time   SDES  10/01/2022 1157    URINE, CLEAN CATCH Performed at [facility], [place] [address], [place], [place] [address]    SPECREQUEST  10/01/2022 1157    NONE Performed at [facility], [place] [address], [place], [place] [address]    CULT MULTIPLE SPECIES PRESENT, SUGGEST RECOLLECTION (A) 10/01/2022 1157   REPTSTATUS 10/02/2022 FINAL 10/01/2022 1157     Radiological Exams on Admission: DG Chest Portable 1 View  Result Date: 12/04/2022 CLINICAL DATA:  Altered mental status EXAM: PORTABLE CHEST 1 VIEW COMPARISON:  Chest x-ray dated October 17, 2022 FINDINGS: Cardiac and mediastinal contours are unchanged. Elevation of the right hemidiaphragm. increased heterogeneous opacities of the right hemithorax. Right chest wall masses, are similar to prior exam. IMPRESSION: Increased heterogeneous opacities of the right hemithorax, possibly due to atelectasis, asymmetric pulmonary edema or infection. Electronically Signed   By: Yetta Glassman M.D.   On: 11/08/2022 20:14   DG Pelvis Portable  Result Date: 12/03/2022 CLINICAL DATA:  Altered mental status EXAM: PORTABLE PELVIS 1-2 VIEWS COMPARISON:  10/22/2022, CT 07/18/2022 FINDINGS: SI joints show mild degenerative change. Pubic symphysis appears intact. Heterogeneous lytic lesion at the lesser trochanter of left femur. No dislocation. Additional lytic lesions are faintly visible, corresponding to the lytic lesions on CT. IMPRESSION: Heterogeneous lytic lesion at the lesser trochanter of the left with pathologic fracture as before. Additional lytic lesions seen on prior CT are also faintly visible. Electronically Signed   By: Donavan Foil M.D.   On: 11/17/2022 20:10   _______________________________________________________________________________________________________ Latest  Blood pressure (!) 147/85, pulse (!) 136, temperature 99.7 F (37.6 C), temperature source Oral, resp. rate (!) 23,  SpO2 99 %.   Vitals  labs and radiology finding personally reviewed  Review of Systems:    Pertinent positives include:  chills, fatigue, weight loss  confusion Constitutional:  No weight loss, night sweats, Fevers,  HEENT:  No headaches, Difficulty swallowing,Tooth/dental problems,Sore throat,  No sneezing, itching, ear ache, nasal congestion, post nasal drip,  Cardio-vascular:  No chest pain, Orthopnea, PND, anasarca, dizziness, palpitations.no Bilateral lower extremity swelling  GI:  No heartburn, indigestion, abdominal pain, nausea, vomiting, diarrhea, change in bowel habits, loss of appetite, melena, blood in stool, hematemesis Resp:  no shortness of breath at rest. No dyspnea on exertion, No excess mucus, no productive cough, No non-productive  cough, No coughing up of blood.No change in color of mucus.No wheezing. Skin:  no rash or lesions. No jaundice GU:  no dysuria, change in color of urine, no urgency or frequency. No straining to urinate.  No flank pain.  Musculoskeletal:  No joint pain or no joint swelling. No decreased range of motion. No back pain.  Psych:  No change in mood or affect. No depression or anxiety. No memory loss.  Neuro: no localizing neurological complaints, no tingling, no weakness, no double vision, no gait abnormality, no slurred speech, no   All systems reviewed and apart from [place] all are negative _______________________________________________________________________________________________ Past Medical History:   Past Medical History:  Diagnosis Date   Anemia of chronic renal failure, stage 3b ([place]) 08/02/2022   Cancer ([place])    Essential hypertension 02/16/2017   Hyperlipemia    Hyperlipidemia 02/16/2017   Hypertension    Hypothyroidism 02/16/2017   Morbid obesity ([place]) 02/16/2017   Multiple myeloma without remission ([place]) 06/20/2022      Past Surgical History:  Procedure Laterality Date   IR IMAGING GUIDED PORT INSERTION  06/17/2022    IR THORACENTESIS ASP PLEURAL SPACE W/IMG GUIDE  06/17/2022   THYROIDECTOMY      Social History:  Ambulatory  wheelchair bound,       reports that she quit smoking about 34 years ago. Her smoking use included cigarettes. She has a 7.50 pack-year smoking history. She has never used smokeless tobacco. She reports that she does not drink alcohol and does not use drugs.   Family History:  Family History  Problem Relation Age of Onset   Diabetes Paternal Grandfather    Stroke Paternal Grandfather    Heart attack Paternal Grandfather    Diabetes Father    Hypertension Father    Stroke Father    Lung cancer Father    Hypertension Mother    Kidney disease Mother    Hypertension Sister    Stroke Paternal Grandmother    Heart attack Maternal Grandmother    ______________________________________________________________________________________________ Allergies: Allergies  Allergen Reactions   Penicillins Other (See Comments)    Severe Yeast infection... No deathly reactions   Codeine Nausea Only   Morphine Other (See Comments)    headache   Tramadol Other (See Comments)    Headaches      Prior to Admission medications   Medication Sig Start Date End Date Taking? Authorizing Provider  diazepam (VALIUM) 5 MG tablet Take thirty minutes before MRI for anxiety. Patient not taking: Reported on 11/22/2022 09/12/22   Volanda Napoleon, MD  famciclovir (FAMVIR) 500 MG tablet Take 1 tablet (500 mg total) by mouth daily. 10/11/22   Volanda Napoleon, MD  fentaNYL (DURAGESIC) 25 MCG/HR Place 1 patch onto the skin every 3 (three) days. 11/22/22   Volanda Napoleon, MD  fluconazole (DIFLUCAN) 100 MG tablet Take 100 mg by mouth daily. 07/28/22   [provider]  lactulose (CHRONULAC) 10 GM/15ML solution TAKE 15 MLS (10 G TOTAL) BY MOUTH 2 (TWO) TIMES DAILY. 10/15/22   Volanda Napoleon, MD  levothyroxine (SYNTHROID) 88 MCG tablet Take 88 mcg by mouth daily before breakfast.    [provider]  LORazepam (ATIVAN) 1 MG tablet Take 1 tablet (1 mg total) by mouth 2 (two) times daily as needed for anxiety. 11/19/22   Volanda Napoleon, MD  megestrol (MEGACE) 400 MG/10ML suspension Take 10 mLs (400 mg total) by mouth 2 (two) times daily. 10/29/22   Amin, Ankit  Chirag, MD  melatonin 3 MG TABS tablet Take 1 tablet (3 mg total) by mouth at bedtime as needed. Patient not taking: Reported on 11/22/2022 10/29/22   Damita Lack, MD  metoprolol tartrate (LOPRESSOR) 25 MG tablet Take 0.5 tablets (12.5 mg total) by mouth 2 (two) times daily. 10/29/22   Amin, Jeanella Flattery, MD  Multiple Vitamin (MULTIVITAMIN WITH MINERALS) TABS tablet Take 1 tablet by mouth daily. 06/27/22   Hongalgi, Lenis Dickinson, MD  ondansetron (ZOFRAN) 8 MG tablet Take 1 tablet (8 mg total) by mouth every 8 (eight) hours as needed for nausea or vomiting. 10/10/22   Volanda Napoleon, MD  Oxycodone HCl 10 MG TABS Take 0.5-1 tablets (5-10 mg total) by mouth every 6 (six) hours as needed (Mod to severe pain). 10/29/22   Amin, Jeanella Flattery, MD  pantoprazole (PROTONIX) 40 MG tablet Take 1 tablet (40 mg total) by mouth 2 (two) times daily. 10/04/22   Volanda Napoleon, MD  prochlorperazine (COMPAZINE) 10 MG tablet Take 1 tablet (10 mg total) by mouth every 6 (six) hours as needed for nausea or vomiting. 06/14/22   Tyler Pita, MD  rosuvastatin (CRESTOR) 10 MG tablet Take 10 mg by mouth every evening.    [provider]  senna (SENOKOT) 8.6 MG TABS tablet Take 1 tablet (8.6 mg total) by mouth daily as needed for mild constipation or moderate constipation. 06/26/22   Hongalgi, Lenis Dickinson, MD  temazepam (RESTORIL) 7.5 MG capsule Take 4 capsules (30 mg total) by mouth at bedtime as needed for sleep. 11/15/22   Volanda Napoleon, MD    ___________________________________________________________________________________________________ Physical Exam:    11/23/2022   10:33 PM 11/18/2022   10:00 PM 11/11/2022    8:30 PM  Vitals  with BMI  Systolic  409 98  Diastolic  85 61  Pulse 811 156 129    1. General:  in No  Acute distress but appears agitated  Chronically ill   -appearing 2. Psychological: Alert and   Oriented only to self 3. Head/ENT:  Dry Mucous Membranes                          Head Non traumatic, neck supple                          Poor Dentition 4. SKIN:  decreased Skin turgor,  Skin clean Dry and intact no rash 5. Heart: Regular rate and rhythm no  Murmur, no Rub or gallop 6. Lungs:  no wheezes or crackles diminished bilaterally 7. Abdomen: Soft,  non-tender, Non distended  bowel sounds present 8. Lower extremities: no clubbing, cyanosis, no  edema 9. Neurologically Grossly intact, moving all 4 extremities equally except for the left arm which is in the brace 10. MSK: Decreased range of motion especially left arm    Chart has been reviewed  ______________________________________________________________________________________________  Assessment/Plan  62 y.o. female with medical history significant of multiple myeloma hypothyroidism hypertension GERD HLD    Admitted for  UTI, sepsis, acute encephalopathy and falls   Present on Admission:  Sepsis ([place])  UTI (urinary tract infection)  Multiple myeloma without remission (HCC)  Anemia of chronic renal failure, stage 3b (HCC)  Hyperlipidemia  Essential hypertension  Hypothyroidism  Acute metabolic encephalopathy  Intractable pain  Metabolic acidosis  CKD (chronic kidney disease) stage 4, GFR 15-29 ml/min (HCC)  Malignant pleural effusion  Anxiety  Tachycardia  CAP (  community acquired pneumonia)  Left humeral fracture     Sepsis ([place])  -SIRS criteria met with      Component Value Date/Time   WBC 6.7 11/06/2022 1946   LYMPHSABS 0.3 (L) 11/26/2022 1946     tachycardia   fever   RR >20 Today's Vitals   11/12/2022 2010 11/14/2022 2030 11/27/2022 2200 11/15/2022 2233  BP: 139/85 98/61 (!) 147/85   Pulse: (!) 142 (!) 129 (!) 156 (!) 136   Resp: (!) 25 20 (!) 31 (!) 23  Temp:      TempSrc:      SpO2: 100% 100% 98% 99%  PainSc:        The recent clinical data is shown below. Vitals:   12/04/2022 2010 11/18/2022 2030 11/21/2022 2200 11/16/2022 2233  BP: 139/85 98/61 (!) 147/85   Pulse: (!) 142 (!) 129 (!) 156 (!) 136  Resp: (!) 25 20 (!) 31 (!) 23  Temp:      TempSrc:      SpO2: 100% 100% 98% 99%       -Most likely source being:  urinary,   Patient meeting criteria for Severe sepsis with    evidence of end organ damage/organ dysfunction such as      acute metabolic encephalopathy      - Obtain serial lactic acid and procalcitonin level.  - Initiated IV antibiotics in ER: Antibiotics Given (last 72 hours)     Date/Time Action Medication Dose Rate   11/21/2022 2200 New Bag/Given   cefTRIAXone (ROCEPHIN) 1 g in sodium chloride 0.9 % 100 mL IVPB 1 g 200 mL/hr       Will continue  on :rocephin   - await results of blood and urine culture  - Rehydrate aggressively  Intravenous fluids were administered,  .   11:13 PM   UTI (urinary tract infection)  - treat with Rocephin         await results of urine culture and adjust antibiotic coverage as needed   Multiple myeloma without remission ([place]) Will notify dr. Marin Olp that pt has been admitted Overall very poor prognosis patient with multiple fractures severe renal failure altered mental status discussed at length CODE STATUS with family who at this point wishes for patient to be full code  Anemia of chronic renal failure, stage 3b (HCC) Chronic stable obtain anemia panel  Hyperlipidemia Restart Crestor 10 mg p.o. daily when able to tolerate  Essential hypertension Unclear if able to tolerate p.o. metoprolol but has chronic ongoing tachycardia Start metoprolol 2.5 mg IV every 6 hours with holding parameters until able to tolerate p.o. Rebound tachycardia could be because of significant tachycardia noted  Hypothyroidism TSH noted to be elevated at 20 will  increase dose of Synthroid  Acute metabolic encephalopathy   - most likely multifactorial secondary to combination of  infection  dehydration secondary to decreased by mouth intake,   - Will rehydrate   - treat underlining infection    Change Ativan to IV to avoid withdrawal  - if no improvement may need further imaging to evaluate for CNS pathology pathology such as MRI of the brain patient could not tolerate at this time given severe sedation.  - neurological exam appears to be nonfocal but patient unable to cooperate fully   - VBG unremarkable no evidence of hypercarbia    - no history of liver disease ammonia 40  If altered mental status persist could benefit from neurology consult could be secondary to paraneoplastic syndrome.  Would appreciate also Dr. Nicole Cella opinion to see if could be related to multiple myeloma   Intractable pain Family did not have a chance to get the fentanyl patch from the pharmacy.  Will initiate while in the hospital. Patient would greatly benefit from palliative care consult And pain management Multiple fractures   Metabolic acidosis Possibly secondary to renal disease If able to tolerate can start on bicarb tablets  CKD (chronic kidney disease) stage 4, GFR 15-29 ml/min (HCC) Most likely secondary to multiple myeloma patient has not followed by nephrology.  Family states that oncology is following and managing. Defer to Dr. Marin Olp if patient would benefit from nephrology follow-up.  -chronic avoid nephrotoxic medications such as NSAIDs, Vanco Zosyn combo,  avoid hypotension, continue to follow renal function   Malignant pleural effusion Chronic  Anxiety Continue ativan As needed  Tachycardia Recurrent tachycardia TSH is actually elevated Obtain echogram as patient has persistent tachycardia  Fall at home, initial encounter Patient with recurrent falls at home Will need PT OT assessment prior to discharge  CAP (community acquired  pneumonia) Possible infiltrates noted on chest x-ray patient unable to provide her own history. Could be progression of her metastatic disease but cannot rule out infiltrates. Given sepsis-like picture for tonight cover with Rocephin and azithromycin await results of sputum culture check Legionella and strep pneumo  Left humeral fracture Order repeat imaging as patient has been in a splint for weeks and supposed to have follow-up with orthopedics but was never able to make it. Would likely benefit from orthopedics consult in a.m. to further evaluate progression of the fracture or if theres is any healing    Other plan as per orders.  DVT prophylaxis:  SCD      Code Status:    Code Status: Prior FULL CODE   as per family  I had personally discussed CODE STATUS with patient and family    Family Communication:   Family   at  Bedside  plan of care was discussed   with  Daughter,    Disposition Plan:     likely will need placement for rehabilitation                          Following barriers for discharge:                            Electrolytes corrected                               Anemia stable                             Pain controlled with PO medications                               Afebrile, white count improving able to transition to PO antibiotics                            Will need consultants to evaluate patient prior to discharge                       Would benefit from PT/OT eval prior to DC  Ordered  Palliative care    consulted                Consults called: emailed oncology   Admission status:  ED Disposition     ED Disposition  Admit   Condition  --   [facility]: [place] [[id number]]  Level of Care: Stepdown [14]  Admit to SDU based on following criteria: Hemodynamic compromise or significant risk of instability:  Patient requiring short term acute titration and management of vasoactive  drips, and invasive monitoring (i.e., CVP and Arterial line).  May admit patient to [facility] or [facility] if equivalent level of care is available:: No  Covid Evaluation: Confirmed COVID Negative  Diagnosis: Sepsis [facility]) [[id number]]  Admitting Physician: Toy Baker [[id number]]  Attending Physician: Toy Baker [[id number]]  Certification:: I certify this patient will need inpatient services for at least 2 midnights  Estimated Length of Stay: 2            inpatient     I Expect 2 midnight stay secondary to severity of patient's current illness need for inpatient interventions justified by the following:  hemodynamic instability despite optimal treatment (tachycardia )  Severe lab/radiological/exam abnormalities including:     sepsis and extensive comorbidities including:  malignancy,    That are currently affecting medical management.   I expect  patient to be hospitalized for 2 midnights requiring inpatient medical care.  Patient is at high risk for adverse outcome (such as loss of life or disability) if not treated.  Indication for inpatient stay as follows:  Severe change from baseline regarding mental status Hemodynamic instability despite maximal medical therapy,    severe pain requiring acute inpatient management,  inability to maintain oral hydration      Need for IV antibiotics, IV fluids     Level of care    stepdown  tele indefinitely please discontinue once patient no longer qualifies COVID-19 Labs    Lab Results  Component Value Date   [place] 11/04/2022     Precautions: admitted as   Covid Negative    Critical   Patient is critically ill due to  hemodynamic instability severe sepsis*  They are at high risk for life/limb threatening clinical deterioration requiring frequent reassessment and modifications of care.  Services provided include examination of the patient, review of relevant ancillary tests, prescription of lifesaving  therapies, review of medications and prophylactic therapy.  Total critical care time excluding separately billable procedures: 60   Minutes.   Tzipora Mcinroy 11/26/2022, 1:13 AM    [facility]     after 2 AM please page floor coverage PA If 7AM-7PM, please contact the day team taking care of the patient using [email or web address]   Patient was evaluated in the context of the global COVID-19 pandemic, which necessitated consideration that the patient might be at risk for infection with the SARS-CoV-2 virus that causes COVID-19. Institutional protocols and algorithms that pertain to the evaluation of patients at risk for COVID-19 are in a state of rapid change based on information released by regulatory bodies including the CDC and federal and state organizations. These policies and algorithms were followed during the patient's care.

## 2022-11-25 NOTE — ED Triage Notes (Signed)
Patient BIB [facility] from home. Increasing altered mental status over the past week. Has multiple myeloma and is getting chemo every Friday. Patient keeps trying to get out of bed at home, which is why she fell tonight. Patient was found on the carpet. No blood thinner.

## 2022-11-25 NOTE — Assessment & Plan Note (Signed)
-  SIRS criteria met with      Component Value Date/Time   WBC 6.7 11/20/2022 1946   LYMPHSABS 0.3 (L) 11/18/2022 1946     tachycardia   fever   RR >20 Today's Vitals   11/17/2022 2010 11/28/2022 2030 11/05/2022 2200 11/15/2022 2233  BP: 139/85 98/61 (!) 147/85   Pulse: (!) 142 (!) 129 (!) 156 (!) 136  Resp: (!) 25 20 (!) 31 (!) 23  Temp:      TempSrc:      SpO2: 100% 100% 98% 99%  PainSc:        The recent clinical data is shown below. Vitals:   11/22/2022 2010 11/04/2022 2030 11/14/2022 2200 11/24/2022 2233  BP: 139/85 98/61 (!) 147/85   Pulse: (!) 142 (!) 129 (!) 156 (!) 136  Resp: (!) 25 20 (!) 31 (!) 23  Temp:      TempSrc:      SpO2: 100% 100% 98% 99%       -Most likely source being:  urinary,   Patient meeting criteria for Severe sepsis with    evidence of end organ damage/organ dysfunction such as      acute metabolic encephalopathy      - Obtain serial lactic acid and procalcitonin level.  - Initiated IV antibiotics in ER: Antibiotics Given (last 72 hours)     Date/Time Action Medication Dose Rate   11/26/2022 2200 New Bag/Given   cefTRIAXone (ROCEPHIN) 1 g in sodium chloride 0.9 % 100 mL IVPB 1 g 200 mL/hr       Will continue  on :rocephin   - await results of blood and urine culture  - Rehydrate aggressively  Intravenous fluids were administered,  .   11:13 PM

## 2022-11-25 NOTE — ED Notes (Signed)
Two save gold tubes in main lab

## 2022-11-25 NOTE — Subjective & Objective (Signed)
Comes back from home with confusion for past 1 week has known history of multiple myeloma for which she has been getting chemotherapy every Friday.  She had a fall tonight and was found on the carpet patient does not take any blood thinners Head CT scan 3 days ago was unremarkable On-call this is aware of patient progressive confusion. In the emergency department she required IV Ativan and then IM Haldol CT head unremarkable UA worrisome for UTI

## 2022-11-25 NOTE — Assessment & Plan Note (Addendum)
Will notify dr. Marin Olp that pt has been admitted Overall very poor prognosis patient with multiple fractures severe renal failure altered mental status discussed at length CODE STATUS with family who at this point wishes for patient to be full code

## 2022-11-26 ENCOUNTER — Inpatient Hospital Stay (HOSPITAL_COMMUNITY): Payer: BC Managed Care – PPO

## 2022-11-26 ENCOUNTER — Encounter: Payer: Self-pay | Admitting: Hematology & Oncology

## 2022-11-26 ENCOUNTER — Telehealth: Payer: Self-pay

## 2022-11-26 ENCOUNTER — Other Ambulatory Visit: Payer: Self-pay

## 2022-11-26 DIAGNOSIS — Z515 Encounter for palliative care: Secondary | ICD-10-CM

## 2022-11-26 DIAGNOSIS — Z7189 Other specified counseling: Secondary | ICD-10-CM | POA: Diagnosis not present

## 2022-11-26 DIAGNOSIS — A419 Sepsis, unspecified organism: Secondary | ICD-10-CM | POA: Diagnosis not present

## 2022-11-26 DIAGNOSIS — R627 Adult failure to thrive: Secondary | ICD-10-CM | POA: Diagnosis not present

## 2022-11-26 DIAGNOSIS — G9341 Metabolic encephalopathy: Secondary | ICD-10-CM | POA: Diagnosis not present

## 2022-11-26 DIAGNOSIS — M898X8 Other specified disorders of bone, other site: Secondary | ICD-10-CM | POA: Diagnosis not present

## 2022-11-26 DIAGNOSIS — R4182 Altered mental status, unspecified: Secondary | ICD-10-CM | POA: Diagnosis not present

## 2022-11-26 DIAGNOSIS — N184 Chronic kidney disease, stage 4 (severe): Secondary | ICD-10-CM | POA: Diagnosis present

## 2022-11-26 DIAGNOSIS — S42302A Unspecified fracture of shaft of humerus, left arm, initial encounter for closed fracture: Secondary | ICD-10-CM | POA: Diagnosis present

## 2022-11-26 DIAGNOSIS — W19XXXA Unspecified fall, initial encounter: Secondary | ICD-10-CM

## 2022-11-26 DIAGNOSIS — R6520 Severe sepsis without septic shock: Secondary | ICD-10-CM | POA: Diagnosis not present

## 2022-11-26 DIAGNOSIS — J189 Pneumonia, unspecified organism: Secondary | ICD-10-CM | POA: Diagnosis present

## 2022-11-26 DIAGNOSIS — S42202B Unspecified fracture of upper end of left humerus, initial encounter for open fracture: Secondary | ICD-10-CM | POA: Diagnosis not present

## 2022-11-26 DIAGNOSIS — N179 Acute kidney failure, unspecified: Secondary | ICD-10-CM | POA: Diagnosis present

## 2022-11-26 DIAGNOSIS — C9000 Multiple myeloma not having achieved remission: Secondary | ICD-10-CM | POA: Diagnosis not present

## 2022-11-26 LAB — SODIUM, URINE, RANDOM: Sodium, Ur: 60 mmol/L

## 2022-11-26 LAB — BASIC METABOLIC PANEL
Anion gap: 12 (ref 5–15)
BUN: 38 mg/dL — ABNORMAL HIGH (ref 8–23)
CO2: 12 mmol/L — ABNORMAL LOW (ref 22–32)
Calcium: 6.6 mg/dL — ABNORMAL LOW (ref 8.9–10.3)
Chloride: 119 mmol/L — ABNORMAL HIGH (ref 98–111)
Creatinine, Ser: 3.67 mg/dL — ABNORMAL HIGH (ref 0.44–1.00)
GFR, Estimated: 13 mL/min — ABNORMAL LOW (ref >60)
Glucose, Bld: 72 mg/dL (ref 70–99)
Potassium: 4 mmol/L (ref 3.5–5.1)
Sodium: 143 mmol/L (ref 135–145)

## 2022-11-26 LAB — TROPONIN I (HIGH SENSITIVITY)
Troponin I (High Sensitivity): 12 ng/L (ref <18)
Troponin I (High Sensitivity): 14 ng/L (ref <18)

## 2022-11-26 LAB — COMPREHENSIVE METABOLIC PANEL
ALT: 13 U/L (ref 0–44)
AST: 26 U/L (ref 15–41)
Albumin: 2.9 g/dL — ABNORMAL LOW (ref 3.5–5.0)
Alkaline Phosphatase: 83 U/L (ref 38–126)
Anion gap: 11 (ref 5–15)
BUN: 40 mg/dL — ABNORMAL HIGH (ref 8–23)
CO2: 14 mmol/L — ABNORMAL LOW (ref 22–32)
Calcium: 6.8 mg/dL — ABNORMAL LOW (ref 8.9–10.3)
Chloride: 117 mmol/L — ABNORMAL HIGH (ref 98–111)
Creatinine, Ser: 3.63 mg/dL — ABNORMAL HIGH (ref 0.44–1.00)
GFR, Estimated: 13 mL/min — ABNORMAL LOW (ref >60)
Glucose, Bld: 75 mg/dL (ref 70–99)
Potassium: 4 mmol/L (ref 3.5–5.1)
Sodium: 142 mmol/L (ref 135–145)
Total Bilirubin: 0.6 mg/dL (ref 0.3–1.2)
Total Protein: 5.9 g/dL — ABNORMAL LOW (ref 6.5–8.1)

## 2022-11-26 LAB — HEPATIC FUNCTION PANEL
ALT: 14 U/L (ref 0–44)
AST: 26 U/L (ref 15–41)
Albumin: 2.8 g/dL — ABNORMAL LOW (ref 3.5–5.0)
Alkaline Phosphatase: 82 U/L (ref 38–126)
Bilirubin, Direct: 0.2 mg/dL (ref 0.0–0.2)
Indirect Bilirubin: 0.3 mg/dL (ref 0.3–0.9)
Total Bilirubin: 0.5 mg/dL (ref 0.3–1.2)
Total Protein: 5.7 g/dL — ABNORMAL LOW (ref 6.5–8.1)

## 2022-11-26 LAB — PHOSPHORUS
Phosphorus: 1.6 mg/dL — ABNORMAL LOW (ref 2.5–4.6)
Phosphorus: 2.1 mg/dL — ABNORMAL LOW (ref 2.5–4.6)

## 2022-11-26 LAB — T4, FREE: Free T4: 0.83 ng/dL (ref 0.61–1.12)

## 2022-11-26 LAB — RAPID URINE DRUG SCREEN, HOSP PERFORMED
Amphetamines: NOT DETECTED
Barbiturates: NOT DETECTED
Benzodiazepines: POSITIVE — AB
Cocaine: NOT DETECTED
Opiates: POSITIVE — AB
Tetrahydrocannabinol: NOT DETECTED

## 2022-11-26 LAB — BLOOD GAS, VENOUS
Acid-base deficit: 7.6 mmol/L — ABNORMAL HIGH (ref 0.0–2.0)
Bicarbonate: 16 mmol/L — ABNORMAL LOW (ref 20.0–28.0)
O2 Saturation: 85.7 %
Patient temperature: 37
pCO2, Ven: 27 mmHg — ABNORMAL LOW (ref 44–60)
pH, Ven: 7.38 (ref 7.25–7.43)
pO2, Ven: 50 mmHg — ABNORMAL HIGH (ref 32–45)

## 2022-11-26 LAB — VITAMIN B12: Vitamin B-12: 1162 pg/mL — ABNORMAL HIGH (ref 180–914)

## 2022-11-26 LAB — FERRITIN: Ferritin: 1217 ng/mL — ABNORMAL HIGH (ref 11–307)

## 2022-11-26 LAB — PREALBUMIN: Prealbumin: 16 mg/dL — ABNORMAL LOW (ref 18–38)

## 2022-11-26 LAB — CBC
HCT: 28.3 % — ABNORMAL LOW (ref 36.0–46.0)
Hemoglobin: 9.2 g/dL — ABNORMAL LOW (ref 12.0–15.0)
MCH: 28.7 pg (ref 26.0–34.0)
MCHC: 32.5 g/dL (ref 30.0–36.0)
MCV: 88.2 fL (ref 80.0–100.0)
Platelets: 99 10*3/uL — ABNORMAL LOW (ref 150–400)
RBC: 3.21 MIL/uL — ABNORMAL LOW (ref 3.87–5.11)
RDW: 22 % — ABNORMAL HIGH (ref 11.5–15.5)
WBC: 6.6 10*3/uL (ref 4.0–10.5)
nRBC: 0.9 % — ABNORMAL HIGH (ref 0.0–0.2)

## 2022-11-26 LAB — CREATININE, URINE, RANDOM: Creatinine, Urine: 73 mg/dL

## 2022-11-26 LAB — IRON AND TIBC
Iron: 52 ug/dL (ref 28–170)
Saturation Ratios: 36 % — ABNORMAL HIGH (ref 10.4–31.8)
TIBC: 146 ug/dL — ABNORMAL LOW (ref 250–450)
UIBC: 94 ug/dL

## 2022-11-26 LAB — AMMONIA: Ammonia: 40 umol/L — ABNORMAL HIGH (ref 9–35)

## 2022-11-26 LAB — HIV ANTIBODY (ROUTINE TESTING W REFLEX): HIV Screen 4th Generation wRfx: NONREACTIVE

## 2022-11-26 LAB — MRSA NEXT GEN BY PCR, NASAL: MRSA by PCR Next Gen: NOT DETECTED

## 2022-11-26 LAB — OSMOLALITY, URINE: Osmolality, Ur: 346 mOsm/kg (ref 300–900)

## 2022-11-26 LAB — STREP PNEUMONIAE URINARY ANTIGEN: Strep Pneumo Urinary Antigen: NEGATIVE

## 2022-11-26 LAB — FOLATE: Folate: 6.5 ng/mL (ref >5.9)

## 2022-11-26 LAB — MAGNESIUM
Magnesium: 2.2 mg/dL (ref 1.7–2.4)
Magnesium: 1.9 mg/dL (ref 1.7–2.4)

## 2022-11-26 LAB — TSH: TSH: 20.434 u[IU]/mL — ABNORMAL HIGH (ref 0.350–4.500)

## 2022-11-26 LAB — CK: Total CK: 71 U/L (ref 38–234)

## 2022-11-26 LAB — OSMOLALITY: Osmolality: 316 mOsm/kg — ABNORMAL HIGH (ref 275–295)

## 2022-11-26 MED ORDER — SODIUM CHLORIDE 0.9 % IV SOLN
500.0000 mg | INTRAVENOUS | Status: DC
  Administered 2022-11-26 – 2022-11-29 (×4): 500 mg via INTRAVENOUS
  Filled 2022-11-26 (×4): qty 5

## 2022-11-26 MED ORDER — LEVOTHYROXINE SODIUM 100 MCG PO TABS
100.0000 ug | ORAL_TABLET | Freq: Every day | ORAL | Status: DC
  Filled 2022-11-26: qty 1

## 2022-11-26 MED ORDER — LACTATED RINGERS IV BOLUS
1000.0000 mL | Freq: Once | INTRAVENOUS | Status: AC
  Administered 2022-11-26: 1000 mL via INTRAVENOUS

## 2022-11-26 MED ORDER — THIAMINE HCL 100 MG/ML IJ SOLN
100.0000 mg | Freq: Every day | INTRAMUSCULAR | Status: DC
  Administered 2022-11-26 – 2022-11-29 (×4): 100 mg via INTRAVENOUS
  Filled 2022-11-26 (×4): qty 2

## 2022-11-26 MED ORDER — SODIUM BICARBONATE 650 MG PO TABS
650.0000 mg | ORAL_TABLET | Freq: Every day | ORAL | Status: DC
  Administered 2022-11-26: 650 mg via ORAL
  Filled 2022-11-26: qty 1

## 2022-11-26 MED ORDER — LORAZEPAM 2 MG/ML IJ SOLN
0.5000 mg | Freq: Three times a day (TID) | INTRAMUSCULAR | Status: DC | PRN
  Administered 2022-11-26 (×3): 1 mg via INTRAVENOUS
  Filled 2022-11-26 (×3): qty 1

## 2022-11-26 MED ORDER — SODIUM PHOSPHATES 45 MMOLE/15ML IV SOLN
15.0000 mmol | Freq: Once | INTRAVENOUS | Status: AC
  Administered 2022-11-26: 15 mmol via INTRAVENOUS
  Filled 2022-11-26: qty 5

## 2022-11-26 MED ORDER — CHLORHEXIDINE GLUCONATE CLOTH 2 % EX PADS
6.0000 | MEDICATED_PAD | Freq: Every day | CUTANEOUS | Status: DC
  Administered 2022-11-27 (×2): 6 via TOPICAL

## 2022-11-26 MED ORDER — CALCIUM GLUCONATE-NACL 1-0.675 GM/50ML-% IV SOLN
1.0000 g | Freq: Once | INTRAVENOUS | Status: AC
  Administered 2022-11-26: 1000 mg via INTRAVENOUS
  Filled 2022-11-26: qty 50

## 2022-11-26 MED ORDER — HYDROMORPHONE HCL 1 MG/ML IJ SOLN
0.5000 mg | INTRAMUSCULAR | Status: DC | PRN
  Administered 2022-11-26 – 2022-11-27 (×9): 1 mg via INTRAVENOUS
  Filled 2022-11-26 (×9): qty 1

## 2022-11-26 NOTE — Assessment & Plan Note (Signed)
Possibly secondary to renal disease If able to tolerate can start on bicarb tablets

## 2022-11-26 NOTE — Assessment & Plan Note (Signed)
Family did not have a chance to get the fentanyl patch from the pharmacy.  Will initiate while in the hospital. Patient would greatly benefit from palliative care consult And pain management Multiple fractures

## 2022-11-26 NOTE — Assessment & Plan Note (Signed)
Recurrent tachycardia TSH is actually elevated Obtain echogram as patient has persistent tachycardia

## 2022-11-26 NOTE — ED Notes (Signed)
ED TO INPATIENT HANDOFF REPORT  ED Nurse Name and Phone #: Rowan Blase RN [phone], Abe People RN   S Name/Age/Gender Michelle [name] 62 y.o. female Room/Bed: WA13/WA13  Code Status   Code Status: Full Code  Home/SNF/Other TBD, home PTA Patient oriented to: none Is this baseline? Yes   Triage Complete: Triage complete  Chief Complaint Sepsis [facility]) [A41.9]  Triage Note Patient BIB [facility] from home. Increasing altered mental status over the past week. Has multiple myeloma and is getting chemo every Friday. Patient keeps trying to get out of bed at home, which is why she fell tonight. Patient was found on the carpet. No blood thinner.    Allergies Allergies  Allergen Reactions   Penicillins Other (See Comments)    Severe Yeast infection   Codeine Nausea Only   Morphine Other (See Comments)    headache   Tramadol Other (See Comments)    Headaches     Level of Care/Admitting Diagnosis ED Disposition     ED Disposition  Admit   Condition  --   Comment  Hospital Area: [place] [[id number]]  Level of Care: Stepdown [14]  Admit to SDU based on following criteria: Hemodynamic compromise or significant risk of instability:  Patient requiring short term acute titration and management of vasoactive drips, and invasive monitoring (i.e., CVP and Arterial line).  May admit patient to [facility] or [facility] if equivalent level of care is available:: No  Covid Evaluation: Confirmed COVID Negative  Diagnosis: Sepsis [facility]) [[id number]]  Admitting Physician: Toy Baker [[id number]]  Attending Physician: Toy Baker [[id number]]  Certification:: I certify this patient will need inpatient services for at least 2 midnights  Estimated Length of Stay: 2          B Medical/Surgery History Past Medical History:  Diagnosis Date   Anemia of chronic renal failure, stage 3b ([place]) 08/02/2022   Cancer ([place])    Essential hypertension 02/16/2017   Hyperlipemia     Hyperlipidemia 02/16/2017   Hypertension    Hypothyroidism 02/16/2017   Morbid obesity ([place]) 02/16/2017   Multiple myeloma without remission ([place]) 06/20/2022   Past Surgical History:  Procedure Laterality Date   IR IMAGING GUIDED PORT INSERTION  06/17/2022   IR THORACENTESIS ASP PLEURAL SPACE W/IMG GUIDE  06/17/2022   THYROIDECTOMY       A IV Location/Drains/Wounds Patient Lines/Drains/Airways Status     Active Line/Drains/Airways     Name Placement date Placement time Site Days   Implanted Port 06/17/22 Left Chest 06/17/22  1321  Chest  162   Peripheral IV 11/24/2022 20 G 11/26/2022  1847  --  1   Peripheral IV 11/26/22 20 G Left;Posterior Hand 11/26/22  0705  Hand  less than 1            Intake/Output Last 24 hours  Intake/Output Summary (Last 24 hours) at 11/26/2022 1731 Last data filed at 11/26/2022 1704 Gross per 24 hour  Intake 6807.83 ml  Output --  Net 6807.83 ml    Labs/Imaging Results for orders placed or performed during the hospital encounter of 11/14/2022 (from the past 48 hour(s))  Urinalysis, Routine w reflex microscopic     Status: Abnormal   Collection Time: 11/12/2022  6:41 PM  Result Value Ref Range   Color, Urine YELLOW YELLOW   APPearance CLOUDY (A) CLEAR   Specific Gravity, Urine 1.011 1.005 - 1.030   pH 6.0 5.0 - 8.0   Glucose, UA NEGATIVE NEGATIVE mg/dL  Hgb urine dipstick SMALL (A) NEGATIVE   Bilirubin Urine NEGATIVE NEGATIVE   Ketones, ur NEGATIVE NEGATIVE mg/dL   Protein, ur 100 (A) NEGATIVE mg/dL   Nitrite NEGATIVE NEGATIVE   Leukocytes,Ua LARGE (A) NEGATIVE   RBC / HPF 0-5 0 - 5 RBC/hpf   WBC, UA >50 (H) 0 - 5 WBC/hpf   Bacteria, UA MANY (A) NONE SEEN   Squamous Epithelial / HPF 0-5 0 - 5 /HPF   WBC Clumps PRESENT    Mucus PRESENT    Non Squamous Epithelial 0-5 (A) NONE SEEN    Comment: Performed at [facility], [place] [address], [place], [place] [address]  Strep pneumoniae urinary antigen     Status: None    Collection Time: 11/14/2022  6:41 PM  Result Value Ref Range   Strep Pneumo Urinary Antigen NEGATIVE NEGATIVE    Comment:        Infection due to S. pneumoniae cannot be absolutely ruled out since the antigen present may be below the detection limit of the test. Performed at [facility], [address], [place], [place] [address]   Resp panel by RT-PCR (RSV, Flu A&B, Covid) Anterior Nasal Swab     Status: None   Collection Time: 11/19/2022  6:47 PM   Specimen: Anterior Nasal Swab  Result Value Ref Range   SARS Coronavirus 2 by RT PCR NEGATIVE NEGATIVE    Comment: (NOTE) SARS-CoV-2 target nucleic acids are NOT DETECTED.  The SARS-CoV-2 RNA is generally detectable in upper respiratory specimens during the acute phase of infection. The lowest concentration of SARS-CoV-2 viral copies this assay can detect is 138 copies/mL. A negative result does not preclude SARS-Cov-2 infection and should not be used as the sole basis for treatment or other patient management decisions. A negative result may occur with  improper specimen collection/handling, submission of specimen other than nasopharyngeal swab, presence of viral mutation(s) within the areas targeted by this assay, and inadequate number of viral copies(<138 copies/mL). A negative result must be combined with clinical observations, patient history, and epidemiological information. The expected result is Negative.  Fact Sheet for Patients:  EntrepreneurPulse.com.au  Fact Sheet for Healthcare Providers:  IncredibleEmployment.be  This test is no t yet approved or cleared by the [place] FDA and  has been authorized for detection and/or diagnosis of SARS-CoV-2 by FDA under an Emergency Use Authorization (EUA). This EUA will remain  in effect (meaning this test can be used) for the duration of the COVID-19 declaration under Section 564(b)(1) of the Act, 21 U.S.C.section 360bbb-3(b)(1),  unless the authorization is terminated  or revoked sooner.       Influenza A by PCR NEGATIVE NEGATIVE   Influenza B by PCR NEGATIVE NEGATIVE    Comment: (NOTE) The Xpert Xpress SARS-CoV-2/FLU/RSV plus assay is intended as an aid in the diagnosis of influenza from Nasopharyngeal swab specimens and should not be used as a sole basis for treatment. Nasal washings and aspirates are unacceptable for Xpert Xpress SARS-CoV-2/FLU/RSV testing.  Fact Sheet for Patients: EntrepreneurPulse.com.au  Fact Sheet for Healthcare Providers: IncredibleEmployment.be  This test is not yet approved or cleared by the [place] FDA and has been authorized for detection and/or diagnosis of SARS-CoV-2 by FDA under an Emergency Use Authorization (EUA). This EUA will remain in effect (meaning this test can be used) for the duration of the COVID-19 declaration under Section 564(b)(1) of the Act, 21 U.S.C. section 360bbb-3(b)(1), unless the authorization is terminated or revoked.  Resp Syncytial Virus by PCR NEGATIVE NEGATIVE    Comment: (NOTE) Fact Sheet for Patients: EntrepreneurPulse.com.au  Fact Sheet for Healthcare Providers: IncredibleEmployment.be  This test is not yet approved or cleared by the [place] FDA and has been authorized for detection and/or diagnosis of SARS-CoV-2 by FDA under an Emergency Use Authorization (EUA). This EUA will remain in effect (meaning this test can be used) for the duration of the COVID-19 declaration under Section 564(b)(1) of the Act, 21 U.S.C. section 360bbb-3(b)(1), unless the authorization is terminated or revoked.  Performed at [facility], [place] [address], [place], [place] [address]   Lactic acid, plasma     Status: None   Collection Time: 11/26/2022  7:46 PM  Result Value Ref Range   Lactic Acid, Venous 1.4 0.5 - 1.9 mmol/L    Comment: Performed at  [facility], [place] [address], [place], [place] [address]  Comprehensive metabolic panel     Status: Abnormal   Collection Time: 12/01/2022  7:46 PM  Result Value Ref Range   Sodium 142 135 - 145 mmol/L   Potassium 3.7 3.5 - 5.1 mmol/L   Chloride 117 (H) 98 - 111 mmol/L   CO2 15 (L) 22 - 32 mmol/L   Glucose, Bld 83 70 - 99 mg/dL    Comment: Glucose reference range applies only to samples taken after fasting for at least 8 hours.   BUN 42 (H) 8 - 23 mg/dL   Creatinine, Ser 3.74 (H) 0.44 - 1.00 mg/dL   Calcium 6.7 (L) 8.9 - 10.3 mg/dL   Total Protein 5.8 (L) 6.5 - 8.1 g/dL   Albumin 3.0 (L) 3.5 - 5.0 g/dL   AST 23 15 - 41 U/L   ALT 13 0 - 44 U/L   Alkaline Phosphatase 85 38 - 126 U/L   Total Bilirubin 0.9 0.3 - 1.2 mg/dL   GFR, Estimated 13 (L) >60 mL/min    Comment: (NOTE) Calculated using the CKD-EPI Creatinine Equation (2021)    Anion gap 10 5 - 15    Comment: Performed at [facility], [place] [address] [place], [place] [address]  CBC with Differential     Status: Abnormal   Collection Time: 12/04/2022  7:46 PM  Result Value Ref Range   WBC 6.7 4.0 - 10.5 K/uL   RBC 3.18 (L) 3.87 - 5.11 MIL/uL   Hemoglobin 9.0 (L) 12.0 - 15.0 g/dL   HCT 28.1 (L) 36.0 - 46.0 %   MCV 88.4 80.0 - 100.0 fL   MCH 28.3 26.0 - 34.0 pg   MCHC 32.0 30.0 - 36.0 g/dL   RDW 22.1 (H) 11.5 - 15.5 %   Platelets 100 (L) 150 - 400 K/uL   nRBC 1.2 (H) 0.0 - 0.2 %   Neutrophils Relative % 67 %   Neutro Abs 4.5 1.7 - 7.7 K/uL   Lymphocytes Relative 5 %   Lymphs Abs 0.3 (L) 0.7 - 4.0 K/uL   Monocytes Relative 27 %   Monocytes Absolute 1.8 (H) 0.1 - 1.0 K/uL   Eosinophils Relative 0 %   Eosinophils Absolute 0.0 0.0 - 0.5 K/uL   Basophils Relative 0 %   Basophils Absolute 0.0 0.0 - 0.1 K/uL   Immature Granulocytes 1 %   Abs Immature Granulocytes 0.04 0.00 - 0.07 K/uL   Ovalocytes PRESENT     Comment: Performed at [facility], [place] [address]  [place], [place] [address]  Protime-INR  Status: Abnormal   Collection Time: 11/16/2022  7:46 PM  Result Value Ref Range   Prothrombin Time 16.1 (H) 11.4 - 15.2 seconds   INR 1.3 (H) 0.8 - 1.2    Comment: (NOTE) INR goal varies based on device and disease states. Performed at [facility], [place] [address], [place], [place] [address]   APTT     Status: None   Collection Time: 11/26/2022  7:46 PM  Result Value Ref Range   aPTT 36 24 - 36 seconds    Comment: Performed at [facility], [place] [address], [place], [place] [address]  Blood Culture (routine x 2)     Status: None (Preliminary result)   Collection Time: 11/09/2022  7:46 PM   Specimen: BLOOD RIGHT HAND  Result Value Ref Range   Specimen Description      BLOOD RIGHT HAND Performed at [facility], [place] [address] [place], [place] [address]    Special Requests      BOTTLES DRAWN AEROBIC AND ANAEROBIC Blood Culture adequate volume Performed at [place] [address] [place], [place] [address]    Culture      NO GROWTH < 12 HOURS Performed at [place] [address], [place], [place] [address]    Report Status PENDING   Blood Culture (routine x 2)     Status: None (Preliminary result)   Collection Time: 11/20/2022  7:54 PM   Specimen: BLOOD  Result Value Ref Range   Specimen Description      BLOOD RIGHT ANTECUBITAL Performed at [place] [address], [place], [place] [address]    Special Requests      BOTTLES DRAWN AEROBIC AND ANAEROBIC Blood Culture adequate volume Performed at [place] [address], [place], [place] [address]    Culture      NO GROWTH < 12 HOURS Performed at [place] [address], [place], [place] [address]    Report Status PENDING   TSH     Status: Abnormal   Collection Time: 12/04/2022  7:54 PM  Result Value Ref Range   TSH 20.434 (H) 0.350 - 4.500 uIU/mL     Comment: Performed by a 3rd Generation assay with a functional sensitivity of <=0.01 uIU/mL. Performed at [facility], [place] [address], [place], [place] [address]   Vitamin B12     Status: Abnormal   Collection Time: 11/24/2022  7:54 PM  Result Value Ref Range   Vitamin B-12 1,162 (H) 180 - 914 pg/mL    Comment: (NOTE) This assay is not validated for testing neonatal or myeloproliferative syndrome specimens for Vitamin B12 levels. Performed at [facility], [place] [address] [place], [place] [address]   Folate     Status: None   Collection Time: 11/17/2022  7:54 PM  Result Value Ref Range   Folate 6.5 >5.9 ng/mL    Comment: Performed at [facility], [place] [address] [place], [place] [address]  Iron and TIBC     Status: Abnormal   Collection Time: 11/28/2022  7:54 PM  Result Value Ref Range   Iron 52 28 - 170 ug/dL   TIBC 146 (L) 250 - 450 ug/dL   Saturation Ratios 36 (H) 10.4 - 31.8 %   UIBC 94 ug/dL    Comment: Performed at [facility], [place] [address] [place], [place] [address]  Ferritin  Status: Abnormal   Collection Time: 11/21/2022  7:54 PM  Result Value Ref Range   Ferritin 1,217 (H) 11 - 307 ng/mL    Comment: Performed at [facility], [place] [address], [place], [place] [address]  Reticulocytes     Status: Abnormal   Collection Time: 11/16/2022  7:54 PM  Result Value Ref Range   Retic Ct Pct 0.5 0.4 - 3.1 %   RBC. 3.18 (L) 3.87 - 5.11 MIL/uL   Retic Count, Absolute 15.6 (L) 19.0 - 186.0 K/uL   Immature Retic Fract 15.6 2.3 - 15.9 %    Comment: Performed at [facility], [place] [address] [place], [place] [address]  Lactic acid, plasma     Status: None   Collection Time: 11/17/2022  9:56 PM  Result Value Ref Range   Lactic Acid, Venous 1.6 0.5 - 1.9 mmol/L    Comment: Performed at [facility], [place] [address], [place], [place] [address]  T4, free      Status: None   Collection Time: 11/29/2022 11:06 PM  Result Value Ref Range   Free T4 0.83 0.61 - 1.12 ng/dL    Comment: (NOTE) Biotin ingestion may interfere with free T4 tests. If the results are inconsistent with the TSH level, previous test results, or the clinical presentation, then consider biotin interference. If needed, order repeat testing after stopping biotin. Performed at [facility], [place] [address], [place], [place] [address]   Creatinine, urine, random     Status: None   Collection Time: 12/02/2022 11:37 PM  Result Value Ref Range   Creatinine, Urine 73 mg/dL    Comment: Performed at [facility], [place] [address] [place], [place] [address]  Osmolality, urine     Status: None   Collection Time: 11/24/2022 11:37 PM  Result Value Ref Range   Osmolality, Ur 346 300 - 900 mOsm/kg    Comment: REPEATED TO VERIFY Performed at [facility], [place] [place], [place] [address]   Sodium, urine, random     Status: None   Collection Time: 11/24/2022 11:37 PM  Result Value Ref Range   Sodium, Ur 60 mmol/L    Comment: Performed at [facility], [place] [address], [place], [place] [address]  Rapid urine drug screen (hospital performed)     Status: Abnormal   Collection Time: 11/29/2022 11:38 PM  Result Value Ref Range   Opiates POSITIVE (A) NONE DETECTED   Cocaine NONE DETECTED NONE DETECTED   Benzodiazepines POSITIVE (A) NONE DETECTED   Amphetamines NONE DETECTED NONE DETECTED   Tetrahydrocannabinol NONE DETECTED NONE DETECTED   Barbiturates NONE DETECTED NONE DETECTED    Comment: (NOTE) DRUG SCREEN FOR MEDICAL PURPOSES ONLY.  IF CONFIRMATION IS NEEDED FOR ANY PURPOSE, NOTIFY LAB WITHIN 5 DAYS.  LOWEST DETECTABLE LIMITS FOR URINE DRUG SCREEN Drug Class                     Cutoff (ng/mL) Amphetamine and metabolites    1000 Barbiturate and metabolites    200 Benzodiazepine                 200 Opiates and metabolites         300 Cocaine and metabolites        300 THC  50 Performed at [facility], [place] [address], [place], [place] [address]   Ammonia     Status: Abnormal   Collection Time: 11/17/2022 11:53 PM  Result Value Ref Range   Ammonia 40 (H) 9 - 35 umol/L    Comment: Performed at [facility], [place] [address], [place], [place] [address]  Blood gas, venous     Status: Abnormal   Collection Time: 11/08/2022 11:53 PM  Result Value Ref Range   pH, Ven 7.38 7.25 - 7.43   pCO2, Ven 27 (L) 44 - 60 mmHg   pO2, Ven 50 (H) 32 - 45 mmHg   Bicarbonate 16.0 (L) 20.0 - 28.0 mmol/L   Acid-base deficit 7.6 (H) 0.0 - 2.0 mmol/L   O2 Saturation 85.7 %   Patient temperature 37.0     Comment: Performed at [facility], [place] [address], [place], [place] [address]  Basic metabolic panel     Status: Abnormal   Collection Time: 11/19/2022 11:53 PM  Result Value Ref Range   Sodium 143 135 - 145 mmol/L   Potassium 4.0 3.5 - 5.1 mmol/L   Chloride 119 (H) 98 - 111 mmol/L   CO2 12 (L) 22 - 32 mmol/L   Glucose, Bld 72 70 - 99 mg/dL    Comment: Glucose reference range applies only to samples taken after fasting for at least 8 hours.   BUN 38 (H) 8 - 23 mg/dL   Creatinine, Ser 3.67 (H) 0.44 - 1.00 mg/dL   Calcium 6.6 (L) 8.9 - 10.3 mg/dL   GFR, Estimated 13 (L) >60 mL/min    Comment: (NOTE) Calculated using the CKD-EPI Creatinine Equation (2021)    Anion gap 12 5 - 15    Comment: Performed at [facility], [place] [address] [place], [place] [address]  Comprehensive metabolic panel     Status: Abnormal   Collection Time: 11/06/2022 11:53 PM  Result Value Ref Range   Sodium 142 135 - 145 mmol/L   Potassium 4.0 3.5 - 5.1 mmol/L   Chloride 117 (H) 98 - 111 mmol/L   CO2 14 (L) 22 - 32 mmol/L   Glucose, Bld 75 70 - 99 mg/dL    Comment: Glucose reference range applies only to samples taken after fasting for at least 8 hours.   BUN  40 (H) 8 - 23 mg/dL   Creatinine, Ser 3.63 (H) 0.44 - 1.00 mg/dL   Calcium 6.8 (L) 8.9 - 10.3 mg/dL   Total Protein 5.9 (L) 6.5 - 8.1 g/dL   Albumin 2.9 (L) 3.5 - 5.0 g/dL   AST 26 15 - 41 U/L   ALT 13 0 - 44 U/L   Alkaline Phosphatase 83 38 - 126 U/L   Total Bilirubin 0.6 0.3 - 1.2 mg/dL   GFR, Estimated 13 (L) >60 mL/min    Comment: (NOTE) Calculated using the CKD-EPI Creatinine Equation (2021)    Anion gap 11 5 - 15    Comment: Performed at [facility], [place] [address], [place], [place] [address]  CBC     Status: Abnormal   Collection Time: 11/28/2022 11:53 PM  Result Value Ref Range   WBC 6.6 4.0 - 10.5 K/uL   RBC 3.21 (L) 3.87 - 5.11 MIL/uL   Hemoglobin 9.2 (L) 12.0 - 15.0 g/dL   HCT 28.3 (L) 36.0 - 46.0 %   MCV 88.2 80.0 - 100.0 fL   MCH 28.7 26.0 - 34.0 pg  MCHC 32.5 30.0 - 36.0 g/dL   RDW 22.0 (H) 11.5 - 15.5 %   Platelets 99 (L) 150 - 400 K/uL   nRBC 0.9 (H) 0.0 - 0.2 %    Comment: Performed at [facility], [place] [address], [place], [place] [address]  Magnesium     Status: None   Collection Time: 11/18/2022 11:53 PM  Result Value Ref Range   Magnesium 2.2 1.7 - 2.4 mg/dL    Comment: Performed at [facility], [place] [address] [place], [place] [address]  Phosphorus     Status: Abnormal   Collection Time: 11/21/2022 11:53 PM  Result Value Ref Range   Phosphorus 1.6 (L) 2.5 - 4.6 mg/dL    Comment: Performed at [facility], [place] [address] [place], [place] [address]  Troponin I (High Sensitivity)     Status: None   Collection Time: 11/26/22 12:04 AM  Result Value Ref Range   Troponin I (High Sensitivity) 12 <18 ng/L    Comment: (NOTE) Elevated high sensitivity troponin I (hsTnI) values and significant  changes across serial measurements may suggest ACS but many other  chronic and acute conditions are known to elevate hsTnI results.  Refer to the "Links" section for chest pain algorithms and  additional  guidance. Performed at [facility], [place] [address] [place], [place] [address]   CK     Status: None   Collection Time: 11/26/22  4:58 AM  Result Value Ref Range   Total CK 71 38 - 234 U/L    Comment: Performed at [facility], [place] [address] [place], [place] [address]  Magnesium     Status: None   Collection Time: 11/26/22  4:58 AM  Result Value Ref Range   Magnesium 1.9 1.7 - 2.4 mg/dL    Comment: Performed at [facility], [place] [address] [place], [place] [address]  Phosphorus     Status: Abnormal   Collection Time: 11/26/22  4:58 AM  Result Value Ref Range   Phosphorus 2.1 (L) 2.5 - 4.6 mg/dL    Comment: Performed at [facility], [place] [address], [place], [place] [address]  Osmolality     Status: Abnormal   Collection Time: 11/26/22  4:58 AM  Result Value Ref Range   Osmolality 316 (H) 275 - 295 mOsm/kg    Comment: Performed at [facility] [place], [place] [place], [place] [address]  Hepatic function panel     Status: Abnormal   Collection Time: 11/26/22  4:58 AM  Result Value Ref Range   Total Protein 5.7 (L) 6.5 - 8.1 g/dL   Albumin 2.8 (L) 3.5 - 5.0 g/dL   AST 26 15 - 41 U/L   ALT 14 0 - 44 U/L   Alkaline Phosphatase 82 38 - 126 U/L   Total Bilirubin 0.5 0.3 - 1.2 mg/dL   Bilirubin, Direct 0.2 0.0 - 0.2 mg/dL   Indirect Bilirubin 0.3 0.3 - 0.9 mg/dL    Comment: Performed at [facility], [place] [address], [place], [place] [address]  Prealbumin     Status: Abnormal   Collection Time: 11/26/22  4:58 AM  Result Value Ref Range   Prealbumin 16 (L) 18 - 38 mg/dL    Comment: Performed at [place] [address] [place], [place] [address]  Troponin I (High Sensitivity)     Status: None   Collection Time: 11/26/22  4:58  AM  Result Value Ref Range   Troponin I (High Sensitivity) 14 <18 ng/L    Comment: (NOTE) Elevated high sensitivity troponin  I (hsTnI) values and significant  changes across serial measurements may suggest ACS but many other  chronic and acute conditions are known to elevate hsTnI results.  Refer to the "Links" section for chest pain algorithms and additional  guidance. Performed at [facility], [place] [address] [place], [place] [address]   HIV Antibody (routine testing w rflx)     Status: None   Collection Time: 11/26/22  4:58 AM  Result Value Ref Range   HIV Screen 4th Generation wRfx Non Reactive Non Reactive    Comment: Performed at [facility], [place] [address] [place], [place] [address]   DG Humerus Left  Result Date: 11/26/2022 CLINICAL DATA:  Multiple myeloma, left humeral fracture EXAM: LEFT HUMERUS - 2+ VIEW COMPARISON:  10/17/2022 FINDINGS: Pathologic fracture of the mid-diaphysis of the left humerus again identified with a poorly circumscribed lytic lesion again identified in keeping the patient's known myeloma. Osteolysis has progressed with increasing cortical erosion in development of multiple ossific pathologic fracture fragments. Overall alignment is preserved with mild lateral angulation of the distal fracture fragment. No additional lytic or blastic bone lesions identified. No dislocation. IMPRESSION: 1. Pathologic fracture of the mid-diaphysis of the left humerus with disease progression and progressive osteolysis and development of multiple ossific pathologic fracture fragments. Electronically Signed   By: Fidela Salisbury M.D.   On: 11/26/2022 02:02   CT Head Wo Contrast  Result Date: 11/27/2022 CLINICAL DATA:  Mental status change. On chemotherapy for multiple myeloma. EXAM: CT HEAD WITHOUT CONTRAST TECHNIQUE: Contiguous axial images were obtained from the base of the skull through the vertex without intravenous contrast. RADIATION DOSE REDUCTION: This exam was performed according to the departmental dose-optimization program which includes automated exposure control,  adjustment of the mA and/or kV according to patient size and/or use of iterative reconstruction technique. COMPARISON:  CT head 11/22/2022.  MRI orbits 09/19/2022 FINDINGS: Brain: No evidence of acute infarction, hemorrhage, hydrocephalus, extra-axial collection or mass lesion/mass effect. Vascular: No hyperdense vessel or unexpected calcification. Skull: Multiple lytic lesions are again seen including prominent lesion within the clivus, unchanged from prior. No acute fractures are seen. Sinuses/Orbits: No acute finding. Other: None. IMPRESSION: No acute intracranial abnormality. Stable osteolytic lesions compatible with patient's history of myeloma. Electronically Signed   By: Ronney Asters M.D.   On: 11/28/2022 23:03   CT Cervical Spine Wo Contrast  Result Date: 11/22/2022 CLINICAL DATA:  Trauma multiple myeloma EXAM: CT CERVICAL SPINE WITHOUT CONTRAST TECHNIQUE: Multidetector CT imaging of the cervical spine was performed without intravenous contrast. Multiplanar CT image reconstructions were also generated. RADIATION DOSE REDUCTION: This exam was performed according to the departmental dose-optimization program which includes automated exposure control, adjustment of the mA and/or kV according to patient size and/or use of iterative reconstruction technique. COMPARISON:  MRI 09/12/2022 FINDINGS: Alignment: No subluxation.  Facet alignment is within normal limits. Skull base and vertebrae: No acute fracture. Multiple lytic lesions within the vertebra and left skull base consistent with history of multiple myeloma. Soft tissues and spinal canal: No prevertebral fluid or swelling. No visible canal hematoma. Disc levels:  Moderate disc space narrowing C5-C6 and C6-C7. Upper chest: Partially visualized right pleural effusion. Suspected right supraclavicular region nodes or masses measuring up to 2.5 cm. Other: None IMPRESSION: 1. No acute fracture or malalignment of the cervical spine. 2.  Multiple lytic lesions  within the vertebra and left skull base consistent with history of multiple myeloma. 3. Partially visualized right pleural effusion. Suspected right supraclavicular region nodes or masses measuring up to 2.5 cm. Electronically Signed   By: Donavan Foil M.D.   On: 11/27/2022 23:03   DG Chest Portable 1 View  Result Date: 11/11/2022 CLINICAL DATA:  Altered mental status EXAM: PORTABLE CHEST 1 VIEW COMPARISON:  Chest x-ray dated October 17, 2022 FINDINGS: Cardiac and mediastinal contours are unchanged. Elevation of the right hemidiaphragm. increased heterogeneous opacities of the right hemithorax. Right chest wall masses, are similar to prior exam. IMPRESSION: Increased heterogeneous opacities of the right hemithorax, possibly due to atelectasis, asymmetric pulmonary edema or infection. Electronically Signed   By: Yetta Glassman M.D.   On: 11/07/2022 20:14   DG Pelvis Portable  Result Date: 11/19/2022 CLINICAL DATA:  Altered mental status EXAM: PORTABLE PELVIS 1-2 VIEWS COMPARISON:  10/22/2022, CT 07/18/2022 FINDINGS: SI joints show mild degenerative change. Pubic symphysis appears intact. Heterogeneous lytic lesion at the lesser trochanter of left femur. No dislocation. Additional lytic lesions are faintly visible, corresponding to the lytic lesions on CT. IMPRESSION: Heterogeneous lytic lesion at the lesser trochanter of the left with pathologic fracture as before. Additional lytic lesions seen on prior CT are also faintly visible. Electronically Signed   By: Donavan Foil M.D.   On: 11/28/2022 20:10    Pending Labs Unresulted Labs (From admission, onward)     Start     Ordered   11/27/22 0500  CBC with Differential/Platelet  Daily,   R      11/26/22 0730   11/27/22 0500  Comprehensive metabolic panel  Daily,   R      11/26/22 0730   11/27/22 0500  Phosphorus  Tomorrow morning,   R        11/26/22 1501   11/27/22 0500  Magnesium  Tomorrow morning,   R        11/26/22 1501   11/26/22 0129   Vitamin B1  Add-on,   AD        11/26/22 0128   11/26/22 0106  Expectorated Sputum Assessment w Gram Stain, Rflx to Resp Cult  Once,   R        11/26/22 0106   11/26/22 0106  Legionella Pneumophila Serogp 1 Ur Ag  Once,   R        11/26/22 0106   11/26/22 0053  T3  Add-on,   AD        11/26/22 0052   12/02/2022 2313  Calcium, ionized  Once,   R        11/26/2022 2312   11/04/2022 1841  Urine Culture  (Undifferentiated presentation (screening labs and basic nursing orders))  ONCE - URGENT,   URGENT       Question:  Indication  Answer:  Altered mental status (if no other cause identified)   11/23/2022 1841            Vitals/Pain Today's Vitals   11/26/22 1455 11/26/22 1500 11/26/22 1600 11/26/22 1630  BP:  132/67 (!) 165/106 129/75  Pulse:  (!) 146 (!) 152 (!) 135  Resp:    18  Temp:      TempSrc:      SpO2:  96% 99% 97%  PainSc: Asleep       Isolation Precautions No active isolations  Medications Medications  cefTRIAXone (ROCEPHIN) 1 g in sodium chloride 0.9 % 100 mL IVPB (  has no administration in time range)  acetaminophen (TYLENOL) tablet 650 mg (has no administration in time range)    Or  acetaminophen (TYLENOL) suppository 650 mg (has no administration in time range)  metoprolol tartrate (LOPRESSOR) injection 2.5 mg (2.5 mg Intravenous Given 11/26/22 1242)  levothyroxine (SYNTHROID) tablet 100 mcg (100 mcg Oral Patient Refused/Not Given 11/26/22 0631)  LORazepam (ATIVAN) injection 0.5-1 mg (1 mg Intravenous Given 11/26/22 1039)  azithromycin (ZITHROMAX) 500 mg in sodium chloride 0.9 % 250 mL IVPB (0 mg Intravenous Stopped 11/26/22 0325)  sodium bicarbonate tablet 650 mg (650 mg Oral Given 11/26/22 1141)  thiamine (VITAMIN B1) injection 100 mg (100 mg Intravenous Given 11/26/22 0142)  HYDROmorphone (DILAUDID) injection 0.5-1 mg (1 mg Intravenous Given 11/26/22 1434)  sodium chloride 0.9 % bolus 1,000 mL (0 mLs Intravenous Stopped 11/18/2022 2030)  acetaminophen (TYLENOL) tablet 650  mg (650 mg Oral Given 11/24/2022 1850)  LORazepam (ATIVAN) injection 1 mg (1 mg Intravenous Given 11/12/2022 1850)  cefTRIAXone (ROCEPHIN) 1 g in sodium chloride 0.9 % 100 mL IVPB (0 g Intravenous Stopped 11/22/2022 2235)  haloperidol lactate (HALDOL) injection 5 mg (5 mg Intravenous Given 11/27/2022 2157)  sodium chloride 0.9 % bolus 1,000 mL (0 mLs Intravenous Stopped 11/26/22 0028)  calcium gluconate 1 g/ 50 mL sodium chloride IVPB (0 mg Intravenous Stopped 11/26/22 0057)  sodium chloride 0.9 % bolus 1,000 mL (0 mLs Intravenous Stopped 11/26/22 0330)  sodium phosphate 15 mmol in dextrose 5 % 250 mL infusion (0 mmol Intravenous Stopped 11/26/22 0948)  lactated ringers bolus 1,000 mL (0 mLs Intravenous Stopped 11/26/22 0949)  lactated ringers bolus 1,000 mL (0 mLs Intravenous Stopped 11/26/22 1141)  lactated ringers bolus 1,000 mL (0 mLs Intravenous Stopped 11/26/22 1432)  calcium gluconate 1 g/ 50 mL sodium chloride IVPB (0 mg Intravenous Stopped 11/26/22 1704)    Mobility non-ambulatory     Focused Assessments Neuro Assessment Handoff:  Swallow screen pass?  Pt too altered to attempt\         Neuro Assessment: Exceptions to [facility] Neuro Checks:      Has TPA been given? No If patient is a Neuro Trauma and patient is going to OR before floor call report to [facility] nurse: [phone] or [phone]   R Recommendations: See Admitting Provider Note  Report given to:   Additional Notes:

## 2022-11-26 NOTE — Progress Notes (Signed)
OT Cancellation Note  Patient Details Name: Michelle [name] MRN: [id number] DOB: Apr 22, 1961   Cancelled Treatment:    Reason Eval/Treat Not Completed: Medical issues which prohibited therapy Patient with soft BP this AM with increased HR with agitation overnight. As of 10/22/22 during patient's last hospital admission was NWB LUE and BLE per ortho recommendation. OT to continue to follow and check back for appropriateness for skilled OT services.  Rennie Plowman, [facility] Office# [phone]  11/26/2022, 7:48 AM

## 2022-11-26 NOTE — Assessment & Plan Note (Signed)
Unclear if able to tolerate p.o. metoprolol but has chronic ongoing tachycardia Start metoprolol 2.5 mg IV every 6 hours with holding parameters until able to tolerate p.o. Rebound tachycardia could be because of significant tachycardia noted

## 2022-11-26 NOTE — Assessment & Plan Note (Signed)
Restart Crestor 10 mg p.o. daily when able to tolerate

## 2022-11-26 NOTE — Consult Note (Signed)
Consultation Note Date: 11/26/2022   Patient Name: Michelle [name]  DOB: January 17, 1961  MRN: [id number]  Age / Sex: 62 y.o., female  PCP: Leeroy Cha, MD Referring Physician: Edwin Dada, *  Reason for Consultation: Establishing goals of care  HPI/Patient Profile: 62 y.o. female  admitted on 11/10/2022 .  Michelle [name] is a 62 y.o. 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   Clinical Assessment and Goals of Care: Patient seen in the ED. Appears confused, no family at bedside. Patient not able to participate in [facility]. Palliative medicine is specialized medical care for people living with serious illness. It focuses on providing relief from the symptoms and stress of a serious illness. The goal is to improve quality of life for both the patient and the family. Goals of care: Broad aims of medical therapy in relation to the patient's values and preferences. Our aim is to provide medical care aimed at enabling patients to achieve the goals that matter most to them, given the circumstances of their particular medical situation and their constraints.    HCPOA  Daughter Michelle [name] [phone]. Call placed, unable to reach at the time of this initial consult.   DISCUSSION/SUMMARY OF RECOMMENDATIONS   Patient known to PMT service, seen in previous hospitalization, at that time, patient and daughter had elected for full code full scope care, patient saw chaplain and completed HCPOA documents designating daughter Michelle [name] as HCPOA agent and patient was given in patient chemotherapy.   Patient presented with  altered mental status, admitted to hospital medicine, oncology has also been consulted. PMT to follow hospital course and overall disease trajectory of illness so as to guide further [place] conversations.   Code Status/Advance Care Planning: Full code   Symptom Management:    Palliative Prophylaxis:  Delirium Protocol  Additional Recommendations (Limitations, Scope, Preferences): Full Scope Treatment  Psycho-social/Spiritual:  Desire for further Chaplaincy support:yes Additional Recommendations: Caregiving  Support/Resources  Prognosis:  Unable to determine  Discharge Planning: To Be Determined      Primary Diagnoses: Present on Admission:  Sepsis ([place])  UTI (urinary tract infection)  Multiple myeloma without remission ([place])  Anemia of chronic renal failure, stage 3b (HCC)  Hyperlipidemia  Essential hypertension  Hypothyroidism  Acute metabolic encephalopathy  Intractable pain  Metabolic acidosis  CKD (chronic kidney disease) stage 4, GFR 15-29 ml/min (HCC)  Malignant pleural effusion  Anxiety  Tachycardia  CAP (community acquired pneumonia)  Left humeral fracture   I have reviewed the medical record, interviewed the patient and family, and examined the patient. The following aspects are pertinent.  Past Medical History:  Diagnosis Date   Anemia of chronic renal failure, stage 3b ([place]) 08/02/2022   Cancer ([place])    Essential hypertension 02/16/2017   Hyperlipemia    Hyperlipidemia  02/16/2017   Hypertension    Hypothyroidism 02/16/2017   Morbid obesity ([place]) 02/16/2017   Multiple myeloma without remission ([place]) 06/20/2022   Social History   Socioeconomic History   Marital status: Widowed    Spouse name: Not on file   Number of children: Not on file   Years of education: Not on file   Highest education level: Not on file  Occupational History   Not on file  Tobacco Use   Smoking status: Former    Packs/day: 0.25    Years: 30.00    Total pack years:  7.50    Types: Cigarettes    Quit date: 18    Years since quitting: 34.0   Smokeless tobacco: Never  Vaping Use   Vaping Use: Never used  Substance and Sexual Activity   Alcohol use: No   Drug use: No   Sexual activity: Not Currently    Birth control/protection: Post-menopausal  Other Topics Concern   Not on file  Social History Narrative   Not on file   Social Determinants of Health   Financial Resource Strain: Not on file  Food Insecurity: No Food Insecurity (10/18/2022)   Hunger Vital Sign    Worried About Running Out of Food in the Last Year: Never true    Ran Out of Food in the Last Year: Never true  Transportation Needs: No Transportation Needs (10/18/2022)   PRAPARE - Hydrologist (Medical): No    Lack of Transportation (Non-Medical): No  Physical Activity: Not on file  Stress: Not on file  Social Connections: Not on file   Family History  Problem Relation Age of Onset   Diabetes Paternal Grandfather    Stroke Paternal Grandfather    Heart attack Paternal Grandfather    Diabetes Father    Hypertension Father    Stroke Father    Lung cancer Father    Hypertension Mother    Kidney disease Mother    Hypertension Sister    Stroke Paternal Grandmother    Heart attack Maternal Grandmother    Scheduled Meds:  levothyroxine  100 mcg Oral Q0600   metoprolol tartrate  2.5 mg Intravenous Q6H   sodium bicarbonate  650 mg Oral Daily   thiamine (VITAMIN B1) injection  100 mg Intravenous Daily   Continuous Infusions:  azithromycin Stopped (11/26/22 0325)   calcium gluconate 1,000 mg (11/26/22 1512)   cefTRIAXone (ROCEPHIN)  IV     PRN Meds:.acetaminophen **OR** acetaminophen, HYDROmorphone (DILAUDID) injection, LORazepam Medications Prior to Admission:  Prior to Admission medications   Medication Sig Start Date End Date Taking? Authorizing Provider  diazepam (VALIUM) 5 MG tablet Take thirty minutes before MRI for anxiety. Patient  not taking: Reported on 11/22/2022 09/12/22   Volanda Napoleon, MD  famciclovir (FAMVIR) 500 MG tablet Take 1 tablet (500 mg total) by mouth daily. 10/11/22   Volanda Napoleon, MD  fentaNYL (DURAGESIC) 25 MCG/HR Place 1 patch onto the skin every 3 (three) days. 11/22/22   Volanda Napoleon, MD  fluconazole (DIFLUCAN) 100 MG tablet Take 100 mg by mouth daily. 07/28/22   [provider]  lactulose (CHRONULAC) 10 GM/15ML solution TAKE 15 MLS (10 G TOTAL) BY MOUTH 2 (TWO) TIMES DAILY. 10/15/22   Volanda Napoleon, MD  levothyroxine (SYNTHROID) 88 MCG tablet Take 88 mcg by mouth daily before breakfast.    [provider]  LORazepam (ATIVAN) 1 MG tablet Take 1 tablet (1 mg total) by mouth  2 (two) times daily as needed for anxiety. 11/19/22   Volanda Napoleon, MD  megestrol (MEGACE) 400 MG/10ML suspension Take 10 mLs (400 mg total) by mouth 2 (two) times daily. 10/29/22   Amin, Jeanella Flattery, MD  melatonin 3 MG TABS tablet Take 1 tablet (3 mg total) by mouth at bedtime as needed. Patient not taking: Reported on 11/22/2022 10/29/22   Damita Lack, MD  metoprolol tartrate (LOPRESSOR) 25 MG tablet Take 0.5 tablets (12.5 mg total) by mouth 2 (two) times daily. 10/29/22   Amin, Jeanella Flattery, MD  Multiple Vitamin (MULTIVITAMIN WITH MINERALS) TABS tablet Take 1 tablet by mouth daily. 06/27/22   Hongalgi, Lenis Dickinson, MD  ondansetron (ZOFRAN) 8 MG tablet Take 1 tablet (8 mg total) by mouth every 8 (eight) hours as needed for nausea or vomiting. 10/10/22   Volanda Napoleon, MD  Oxycodone HCl 10 MG TABS Take 0.5-1 tablets (5-10 mg total) by mouth every 6 (six) hours as needed (Mod to severe pain). 10/29/22   Amin, Jeanella Flattery, MD  pantoprazole (PROTONIX) 40 MG tablet Take 1 tablet (40 mg total) by mouth 2 (two) times daily. 10/04/22   Volanda Napoleon, MD  prochlorperazine (COMPAZINE) 10 MG tablet Take 1 tablet (10 mg total) by mouth every 6 (six) hours as needed for nausea or vomiting. 06/14/22   Tyler Pita, MD  rosuvastatin (CRESTOR) 10 MG tablet Take 10 mg by mouth every evening.    [provider]  senna (SENOKOT) 8.6 MG TABS tablet Take 1 tablet (8.6 mg total) by mouth daily as needed for mild constipation or moderate constipation. 06/26/22   Hongalgi, Lenis Dickinson, MD  temazepam (RESTORIL) 7.5 MG capsule Take 4 capsules (30 mg total) by mouth at bedtime as needed for sleep. 11/15/22   Volanda Napoleon, MD   Allergies  Allergen Reactions   Penicillins Other (See Comments)    Severe Yeast infection... No deathly reactions   Codeine Nausea Only   Morphine Other (See Comments)    headache   Tramadol Other (See Comments)    Headaches    Review of Systems + confused   Physical Exam Awake resting in bed  Confused in bed  Regular work of breathing Abdomen not distended No edema L arm in sling.   Vital Signs: BP 105/63   Pulse (!) 111   Temp 98 F (36.7 C) (Axillary)   Resp 18   SpO2 100%  Pain Scale: 0-10   Pain Score: Asleep   SpO2: SpO2: 100 % O2 Device:SpO2: 100 % O2 Flow Rate: .   IO: Intake/output summary:  Intake/Output Summary (Last 24 hours) at 11/26/2022 1537 Last data filed at 11/26/2022 1432 Gross per 24 hour  Intake 6762.94 ml  Output --  Net 6762.94 ml    LBM: Last BM Date : 11/26/22 Baseline Weight:   Most recent weight:       Palliative Assessment/Data:   PPS 40%  Time In: 1430 Time Out:  1530 Time Total:  60 min.  Greater than 50%  of this time was spent counseling and coordinating care related to the above assessment and plan.  Signed by: Loistine Chance, MD   Please contact Palliative Medicine Team phone at [phone] for questions and concerns.  For individual provider: See Shea Evans

## 2022-11-26 NOTE — Assessment & Plan Note (Signed)
Chronic stable obtain anemia panel 

## 2022-11-26 NOTE — Assessment & Plan Note (Signed)
Most likely secondary to multiple myeloma patient has not followed by nephrology.  Family states that oncology is following and managing. Defer to Dr. Marin Olp if patient would benefit from nephrology follow-up.  -chronic avoid nephrotoxic medications such as NSAIDs, Vanco Zosyn combo,  avoid hypotension, continue to follow renal function

## 2022-11-26 NOTE — Telephone Encounter (Signed)
Advised via MyChart.

## 2022-11-26 NOTE — Consult Note (Signed)
Ms. Casimir is well-known to me.  She is a very nice 62 year old Afro-American female.  She has IgG kappa myeloma.  She has fairly aggressive disease.  She has a lot of bone involvement.  She has a fracture in the left humerus.  She has some fractures in the hips bilaterally.  She has not agreed any surgery for this.  She currently is getting treatment for this.  She was in the office on 11/22/2022.  She is receiving Faspro/Cytoxan/Kyprolis.  She is responding.  Her light chain, which is a real problem, has come down a little bit.  She does have a lot of weakness in fatigue.  She was started on a Duragesic patch.  It was a 25 mcg patch.  We will have to take this off I think.  She comes in now.  She was unable to give me any meaningful information.  She is being followed at home.  I know her family is trying her best to to help take care of her.  When she came in, her electrolytes show sodium 142.  Potassium 3.7.  BUN 42 creatinine 3.74.  Calcium is only 6.7.  She has had hypercalcemia in the past.  Her albumin was 3.0.  Her ammonia was 40.  The white cell count 6.6.  Hemoglobin 9.2.  Platelet count 99,000.  She may have a urinary tract infection.  She had a CT of the brain when she came in.  This did not show any parenchymal lesions.  She did have some osteolytic bony lesions.  She had a CT of the neck.  This also showed some lytic lesions.  A chest x-ray was done.  There may be an infiltrate over on the right lung.  Again, she really cannot give me any information.  She does have some hypotension.  Her heart rate is on the high side.  She does have a Port-A-Cath.  This really needs to be accessed.  Again, I realize this is a very difficult problem.  She just does not allowed [place] to do any surgery for these fractures.  This really has been the determinant of her quality of life.  There is been no obvious fever for I can tell.  There is been no obvious bleeding.  Her vital signs show temperature  of 98.6.  Pulse is 130.  Blood pressure 84/57.  Oxygen saturation is 98%.  Her lungs sound relatively clear bilaterally.  She has good air movement bilaterally.  There are no intraoral lesions.  Cardiac exam is tachycardic but regular.  Abdomen is soft.  Bowel sounds are present.  She has no obvious fluid wave.  There is no guarding or rebound tenderness.  Extremities shows the left arm in a sling.  Neurological exam shows no focal deficits.  Again she seems to have some element of encephalopathy.  Ms. Sparrow has IgG kappa myeloma.  It is the light chains that are her problem.  We have her on chemotherapy.  Her last light chain level did come down a little bit.  I do not know if the Duragesic patch might be too much for her.  I will take this off.  She is on antibiotics.  She is on Zithromax and Rocephin.  We will see what cultures show.  I know that this is incredibly difficult.  I know she has a disease that is going to be very difficult to improve significantly.  I know that we are trying hard.  I know that she  does have a strong faith.  We have talked about this in the past.  I really cannot talk to her about CODE STATUS right now.  Hopefully, she will improve with antibiotics.  Will see how her labs look.  Her calcium is on the lower side.  The Port-A-Cath by does need to be accessed.  This make life a lot easier for her.  We will follow along.  I know that she will get incredible care no matter where she is in the hospital.  [name], MD  Jeneen Rinks 1:5

## 2022-11-26 NOTE — Assessment & Plan Note (Signed)
Chronic. 

## 2022-11-26 NOTE — Assessment & Plan Note (Signed)
TSH noted to be elevated at 20 will increase dose of Synthroid

## 2022-11-26 NOTE — Assessment & Plan Note (Signed)
Possible infiltrates noted on chest x-ray patient unable to provide her own history. Could be progression of her metastatic disease but cannot rule out infiltrates. Given sepsis-like picture for tonight cover with Rocephin and azithromycin await results of sputum culture check Legionella and strep pneumo

## 2022-11-26 NOTE — Progress Notes (Signed)
  Progress Note   Patient: Michelle [name] IEP:[id number] DOB: January 12, 1961 DOA: 11/05/2022     1 DOS: the patient was seen and examined on 11/26/2022 at 8:34AM      Brief hospital course: Mrs. Gottlieb is a 62 y.o. 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     Assessment and Plan: Sepsis with end organ damage due to UTI vs CAP - Continue rocephin and azithromycin - Continue IV fluids   Acute metabolic encephalopathy CT head normal.  Ammonia level clinically insignificant. Appears dry. - Hold Duragesic - IV fluids - Antibiotics as above  Multiple myeloma - Consult Oncology, appreciate cares  Hypothyroidism - Continue levothyroxine  Left humeral fracture - Maintain arm in sling  CKD IIIb Cr has been worsening this month, it is currently stable, slightly lower than during last admission - Continue Bicarb  Tachycardia - Continue metoprolol  Hypocalcemia - Supplement Ca  Thrombocytopenia  Anemia  Metabolic acidosis Due to renal failure - Continue bicarb  Hypophosphatemia - Supplement phos          Subjective: Patient remains altered.  She is agitated and trying out of bed.  No vomiting, no respiratory symptoms.     Physical Exam: BP 105/63   Pulse (!) 111   Temp 98 F (36.7 C) (Axillary)   Resp 18   SpO2 100%   Elderly adult female, tachycardic, blood pressure soft Tachycardic, no murmurs, no peripheral edema Mucous membranes dry Respiratory rate seems increased, lungs diminished overall, no rales or wheezes appreciated Abdomen soft, no grimace to palpation Left arm is in a sling, the right arm seems to have generalized weakness,  bilateral legs, unable to assess that she does not follow commands, groans occasionally, no intelligible answers    Data Reviewed: CK normal Magnesium normal pH normal Ammonia 40 Phosphate level Troponin negative LFTs unremarkable Radiograph shows left humeral fracture She has metabolic acidosis    Family Communication: 1 daughter at the bedside, another by phone    Disposition: Status is: Inpatient         Author: Edwin Dada, MD 11/26/2022 3:01 PM  For on call review [email or web address].

## 2022-11-26 NOTE — Assessment & Plan Note (Addendum)
-  most likely multifactorial secondary to combination of  infection  dehydration secondary to decreased by mouth intake,   - Will rehydrate   - treat underlining infection    Change Ativan to IV to avoid withdrawal  - if no improvement may need further imaging to evaluate for CNS pathology pathology such as MRI of the brain patient could not tolerate at this time given severe sedation.  - neurological exam appears to be nonfocal but patient unable to cooperate fully   - VBG unremarkable no evidence of hypercarbia    - no history of liver disease ammonia 40  If altered mental status persist could benefit from neurology consult could be secondary to paraneoplastic syndrome.  Would appreciate also Dr. Nicole Cella opinion to see if could be related to multiple myeloma

## 2022-11-26 NOTE — Progress Notes (Signed)
  PT Cancellation Note  Patient Details Name: Michelle [name] MRN: [id number] DOB: 04/20/1961   Cancelled Treatment:    Reason Eval/Treat Not Completed: Medical issues which prohibited therapy.  [place] Office [phone] Weekend pager-[phone]    Claretha Cooper 11/26/2022, 8:05 AM

## 2022-11-26 NOTE — Assessment & Plan Note (Signed)
Order repeat imaging as patient has been in a splint for weeks and supposed to have follow-up with orthopedics but was never able to make it. Would likely benefit from orthopedics consult in a.m. to further evaluate progression of the fracture or if theres is any healing

## 2022-11-26 NOTE — Assessment & Plan Note (Signed)
Patient with recurrent falls at home Will need PT OT assessment prior to discharge

## 2022-11-26 NOTE — ED Notes (Signed)
Patient very agitated and anxious, keeps trying to climb out of bed and repositioning self. Patient changed to a hospital bed and bed alarm placed. RN assisted in cleaning and  repositioning patient, PRN meds given to calm patient nurse will reassess

## 2022-11-26 NOTE — Assessment & Plan Note (Signed)
Continue ativan As needed

## 2022-11-26 NOTE — Telephone Encounter (Signed)
-----  Message from Volanda Napoleon, MD sent at 11/09/2022  4:48 PM EST ----- Please call and let her know that the light chains are coming down.  As such, treatment is working.  This is can take a while for the light chains to get to where we would like it.  Thanks.  Michelle [name]

## 2022-11-27 ENCOUNTER — Inpatient Hospital Stay (HOSPITAL_COMMUNITY)
Admit: 2022-11-27 | Discharge: 2022-11-27 | Disposition: A | Discharge status: Home / Self Care | Payer: BC Managed Care – PPO | Attending: Family Medicine | Admitting: Family Medicine

## 2022-11-27 ENCOUNTER — Other Ambulatory Visit: Payer: Self-pay

## 2022-11-27 ENCOUNTER — Ambulatory Visit: Payer: BC Managed Care – PPO | Admitting: Pulmonary Disease

## 2022-11-27 DIAGNOSIS — D696 Thrombocytopenia, unspecified: Secondary | ICD-10-CM | POA: Insufficient documentation

## 2022-11-27 DIAGNOSIS — A419 Sepsis, unspecified organism: Secondary | ICD-10-CM | POA: Diagnosis not present

## 2022-11-27 DIAGNOSIS — G9341 Metabolic encephalopathy: Secondary | ICD-10-CM | POA: Diagnosis not present

## 2022-11-27 DIAGNOSIS — R627 Adult failure to thrive: Secondary | ICD-10-CM | POA: Diagnosis not present

## 2022-11-27 DIAGNOSIS — C9000 Multiple myeloma not having achieved remission: Secondary | ICD-10-CM | POA: Diagnosis not present

## 2022-11-27 DIAGNOSIS — E870 Hyperosmolality and hypernatremia: Secondary | ICD-10-CM | POA: Insufficient documentation

## 2022-11-27 DIAGNOSIS — E440 Moderate protein-calorie malnutrition: Secondary | ICD-10-CM | POA: Insufficient documentation

## 2022-11-27 DIAGNOSIS — S42202B Unspecified fracture of upper end of left humerus, initial encounter for open fracture: Secondary | ICD-10-CM | POA: Diagnosis not present

## 2022-11-27 DIAGNOSIS — N3000 Acute cystitis without hematuria: Secondary | ICD-10-CM | POA: Diagnosis not present

## 2022-11-27 DIAGNOSIS — S42202G Unspecified fracture of upper end of left humerus, subsequent encounter for fracture with delayed healing: Secondary | ICD-10-CM | POA: Diagnosis not present

## 2022-11-27 LAB — GLUCOSE, CAPILLARY
Glucose-Capillary: 60 mg/dL — ABNORMAL LOW (ref 70–99)
Glucose-Capillary: 91 mg/dL (ref 70–99)
Glucose-Capillary: 60 mg/dL — ABNORMAL LOW (ref 70–99)
Glucose-Capillary: 106 mg/dL — ABNORMAL HIGH (ref 70–99)
Glucose-Capillary: 77 mg/dL (ref 70–99)
Glucose-Capillary: 92 mg/dL (ref 70–99)

## 2022-11-27 LAB — CBC WITH DIFFERENTIAL/PLATELET
Abs Immature Granulocytes: 0.08 10*3/uL — ABNORMAL HIGH (ref 0.00–0.07)
Basophils Absolute: 0 10*3/uL (ref 0.0–0.1)
Basophils Relative: 0 %
Eosinophils Absolute: 0 10*3/uL (ref 0.0–0.5)
Eosinophils Relative: 0 %
HCT: 30.1 % — ABNORMAL LOW (ref 36.0–46.0)
Hemoglobin: 9.6 g/dL — ABNORMAL LOW (ref 12.0–15.0)
Immature Granulocytes: 1 %
Lymphocytes Relative: 5 %
Lymphs Abs: 0.4 10*3/uL — ABNORMAL LOW (ref 0.7–4.0)
MCH: 28.5 pg (ref 26.0–34.0)
MCHC: 31.9 g/dL (ref 30.0–36.0)
MCV: 89.3 fL (ref 80.0–100.0)
Monocytes Absolute: 1.8 10*3/uL — ABNORMAL HIGH (ref 0.1–1.0)
Monocytes Relative: 26 %
Neutro Abs: 4.9 10*3/uL (ref 1.7–7.7)
Neutrophils Relative %: 68 %
Platelets: 127 10*3/uL — ABNORMAL LOW (ref 150–400)
RBC: 3.37 MIL/uL — ABNORMAL LOW (ref 3.87–5.11)
RDW: 23 % — ABNORMAL HIGH (ref 11.5–15.5)
WBC: 7.2 10*3/uL (ref 4.0–10.5)
nRBC: 0.7 % — ABNORMAL HIGH (ref 0.0–0.2)

## 2022-11-27 LAB — COMPREHENSIVE METABOLIC PANEL
ALT: 14 U/L (ref 0–44)
AST: 26 U/L (ref 15–41)
Albumin: 2.7 g/dL — ABNORMAL LOW (ref 3.5–5.0)
Alkaline Phosphatase: 87 U/L (ref 38–126)
Anion gap: 10 (ref 5–15)
BUN: 29 mg/dL — ABNORMAL HIGH (ref 8–23)
CO2: 16 mmol/L — ABNORMAL LOW (ref 22–32)
Calcium: 7 mg/dL — ABNORMAL LOW (ref 8.9–10.3)
Chloride: 121 mmol/L — ABNORMAL HIGH (ref 98–111)
Creatinine, Ser: 3.24 mg/dL — ABNORMAL HIGH (ref 0.44–1.00)
GFR, Estimated: 15 mL/min — ABNORMAL LOW (ref >60)
Glucose, Bld: 72 mg/dL (ref 70–99)
Potassium: 4.1 mmol/L (ref 3.5–5.1)
Sodium: 147 mmol/L — ABNORMAL HIGH (ref 135–145)
Total Bilirubin: 0.6 mg/dL (ref 0.3–1.2)
Total Protein: 6.1 g/dL — ABNORMAL LOW (ref 6.5–8.1)

## 2022-11-27 LAB — LEGIONELLA PNEUMOPHILA SEROGP 1 UR AG: L. pneumophila Serogp 1 Ur Ag: NEGATIVE

## 2022-11-27 LAB — MAGNESIUM: Magnesium: 1.8 mg/dL (ref 1.7–2.4)

## 2022-11-27 LAB — CALCIUM, IONIZED: Calcium, Ionized, Serum: 3.9 mg/dL — ABNORMAL LOW (ref 4.5–5.6)

## 2022-11-27 LAB — PHOSPHORUS: Phosphorus: 2.9 mg/dL (ref 2.5–4.6)

## 2022-11-27 MED ORDER — CHLORHEXIDINE GLUCONATE CLOTH 2 % EX PADS
6.0000 | MEDICATED_PAD | Freq: Every day | CUTANEOUS | Status: DC
  Administered 2022-11-28 – 2022-11-29 (×2): 6 via TOPICAL

## 2022-11-27 MED ORDER — DEXTROSE-NACL 5-0.45 % IV SOLN: INTRAVENOUS | Status: DC

## 2022-11-27 MED ORDER — DEXTROSE 50 % IV SOLN
12.5000 g | INTRAVENOUS | Status: AC
  Administered 2022-11-27: 12.5 g via INTRAVENOUS
  Filled 2022-11-27: qty 50

## 2022-11-27 MED ORDER — DEXTROSE-NACL 5-0.9 % IV SOLN: INTRAVENOUS | Status: DC

## 2022-11-27 MED ORDER — HYDROMORPHONE HCL 1 MG/ML IJ SOLN
1.0000 mg | INTRAMUSCULAR | Status: DC | PRN
  Administered 2022-11-27 – 2022-11-30 (×29): 1 mg via INTRAVENOUS
  Filled 2022-11-27 (×30): qty 1

## 2022-11-27 MED ORDER — LORAZEPAM 2 MG/ML IJ SOLN
0.5000 mg | Freq: Four times a day (QID) | INTRAMUSCULAR | Status: DC | PRN
  Administered 2022-11-27: 1 mg via INTRAVENOUS
  Filled 2022-11-27: qty 1

## 2022-11-27 MED ORDER — LORAZEPAM 2 MG/ML IJ SOLN
0.5000 mg | INTRAMUSCULAR | Status: DC | PRN
  Administered 2022-11-27 – 2022-11-30 (×17): 1 mg via INTRAVENOUS
  Filled 2022-11-27 (×18): qty 1

## 2022-11-27 NOTE — Assessment & Plan Note (Signed)
Acute on chronic - Continue metoprolol

## 2022-11-27 NOTE — Progress Notes (Signed)
Patient is moving around and trying to get out of the bed.  Nurse gave her '1mg'$  of Ativan.  Will see if I can get patient hooked up, after she relaxes.

## 2022-11-27 NOTE — Progress Notes (Signed)
Progress Note   Patient: Michelle [name] YSA:[id number] DOB: [birth year] DOA: 11/27/2022     2 DOS: the patient was seen and examined on 11/27/2022 at 8:27AM      Brief hospital course: Michelle [name] is a 62 y.o. 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    1/22: Admitted on antibiotics for ?sepsis, AKI 1/23: Oncology and Palliative care consulted, still densely encephalopathic 1/24: Mentation no change, Cr slightly better     Assessment and Plan: * Sepsis with end organ damage P/w tachycardia, tachypnea, encephalopathy and thrombocytopenia <100K due in whole or in part to infection, probably pneumonia, maybe UTI.  - Continue Rocephin and azithromycin - Follow culture data, no growth to date    Acute metabolic encephalopathy No improvement. CT head normal, but have to assume this is related to her skull base tumor/plasmacytoma - Hold long-acting psychotropic meds Rectogesic - Continue IV fluids and antibiotics - Obtain EEG  Multiple myeloma without remission (Michelle [name]) - Consult oncology  Left humeral fracture - Maintain arm in sling  Acute renal failure superimposed on stage 3b chronic kidney disease ([place]) Patient renal failure during admission last month, creatinine up to 7, slowly been trending down, but not yet back to baseline 1.2-1.4.  Trending down today, urine output okay -continue oral bicarb  Hypernatremia Sodium 147, hyperchloremia noted, still appears dehydrated - Start hypotonic fluids  Hypophosphatemia Supplemented and resolved  Thrombocytopenia (HCC) Platelets slightly better today  Hypocalcemia Supplemented and resolved  Failure to thrive  in adult - Consult palliative care, appreciate recommendations  Fall at home, initial encounter    Anxiety    Malignant pleural effusion    Anemia of chronic renal failure, stage 3b (HCC) Hemoglobin stable, no clinical bleeding  Tachycardia Acute on chronic - Continue metoprolol  Metabolic acidosis Due to renal failure - Continue bicarb  Hypothyroidism - Continue levothyroxine  Essential hypertension Blood pressure elevated - Continue metoprolol  Hyperlipidemia - Hold Crestor          Subjective: Patient remains densely encephalopathic.  She somewhat tracks me in the room, but does not make eye contact, does not follow commands, makes no intelligible utterances.  No fever overnight.  Remains somewhat agitated with pain well-controlled.     Physical Exam: BP (!) 158/97   Pulse (!) 134   Temp 99.1 F (37.3 C) (Axillary)   Resp (!) 26   SpO2 98%   Adult female, restless, lying in bed Tachycardic, regular, no peripheral edema Respiratory rate normal, lung sounds diminished, no rales or wheezes appreciated Abdomen without grimace to palpation Attention diminished, does not respond appropriately to questions, right arm seems to move appropriately, but she does not follow commands, makes no intelligible utterances    Data Reviewed: Oncology note reviewed Glucose level overnight Phosphate resolved Creatinine 3.2 Bicarb slightly up Platelets up to 127, hemoglobin 9.6 Sodium up to 147    Family Communication: None at the bedside    Disposition: Status is: Inpatient The patient presents with severe encephalopathy, this is not improving with antibiotics and fluids, and I agree with oncology, if this does not improve in the next few days, we should probably transition to comfort cares        Author: Edwin Dada, MD  11/27/2022 9:00 AM  For on call review [email or web address].

## 2022-11-27 NOTE — Assessment & Plan Note (Signed)
Sodium 147, hyperchloremia noted, still appears dehydrated - Start hypotonic fluids

## 2022-11-27 NOTE — Progress Notes (Signed)
EEG complete - results pending 

## 2022-11-27 NOTE — Assessment & Plan Note (Signed)
Patient renal failure during admission last month, creatinine up to 7, slowly been trending down, but not yet back to baseline 1.2-1.4.  Trending down today, urine output okay -continue oral bicarb

## 2022-11-27 NOTE — Assessment & Plan Note (Signed)
Blood pressure elevated - Continue metoprolol

## 2022-11-27 NOTE — Assessment & Plan Note (Signed)
Due to renal failure - Continue bicarb

## 2022-11-27 NOTE — Assessment & Plan Note (Signed)
-  Continue levothyroxine 

## 2022-11-27 NOTE — Assessment & Plan Note (Signed)
This has failed treatment - Recommend hospice and comfort cares

## 2022-11-27 NOTE — Progress Notes (Signed)
PT Cancellation Note  Patient Details Name: Michelle [name] MRN: [id number] DOB: [birth year]   Cancelled Treatment:    Reason Eval/Treat Not Completed: PT screened, no needs identified, will sign off- As per OT note, patient known from previous admissions, with non WB on multiple extremities, was DC'd home with family support with [facility] lift. Currently no skilled PT needs.  .  [place] Office [phone] Weekend pager-[phone]  Claretha Cooper 11/27/2022, 2:51 PM

## 2022-11-27 NOTE — Progress Notes (Signed)
Initial Nutrition Assessment  DOCUMENTATION CODES:   Non-severe (moderate) malnutrition in context of chronic illness  INTERVENTION:  - Advance diet as medically appropriate.  - Recommend Ensure Plus High Protein po TID once diet advanced. Each supplement provides 350 kcal and 20 grams of protein.  - If unable to advance oral diet in the next 2-3 days consider small bore feeding tube and starting tube feeds if within patient's goals of care.   - Monitor weight trends.    NUTRITION DIAGNOSIS:   Moderate Malnutrition related to chronic illness as evidenced by mild fat depletion, mild muscle depletion, percent weight loss (26.5% in 7 months).  GOAL:   Patient will meet greater than or equal to 90% of their needs  MONITOR:   Diet advancement, Weight trends  REASON FOR ASSESSMENT:   Consult Assessment of nutrition requirement/status  ASSESSMENT:   62 y.o. 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  Patient asleep in bed at time of visit. Per discussion with RN patient remains encephalopathic and not responding to questions.  Daughter at bedside able to provide some history.   Reports the patient has had weight loss over the past year. Shares she started losing weight around a year ago but over the past few months has been able to gain some weight back.  Per EMR, patient weighed at 215# in June and now weighed at 158# - a 57# or 26.5% weight loss in 7 months, significant for the time frame.   Daughter reports they were trying to encourage the patient to eat 3 meals a day at home of foods like pork and chicken and vegetables. Has previously consumed Ensure and  enjoyed but not recently.   Patient currently NPO at this time, presumably due to encephalopathy.    Medications reviewed and include: Synthroid, Thiamine, D5W at 118m/hr (provide 510 kcals over 24 hours)  Labs reviewed:  Na 147 Creatinine 3.24   NUTRITION - FOCUSED PHYSICAL EXAM:  Flowsheet Row Most Recent Value  Orbital Region Mild depletion  Upper Arm Region Mild depletion  Thoracic and Lumbar Region Mild depletion  Buccal Region Mild depletion  Temple Region Moderate depletion  Clavicle Bone Region Mild depletion  Clavicle and Acromion Bone Region Mild depletion  Scapular Bone Region Unable to assess  Dorsal Hand No depletion  Patellar Region Mild depletion  Anterior Thigh Region Mild depletion  Posterior Calf Region Mild depletion  Edema (RD Assessment) None  Hair Reviewed  Eyes Reviewed  Mouth Reviewed  Skin Reviewed  Nails Reviewed       Diet Order:   Diet Order             Diet NPO time specified  Diet effective now                   EDUCATION NEEDS:  Not appropriate for education at this time  Skin:  Skin Assessment: Reviewed RN Assessment  Last BM:  1/23  Height:  Ht Readings from Last 1 Encounters:  11/19/22 '5\' 4"'$  (1.626 m)   Weight:  Wt Readings from Last 1 Encounters:  11/27/22 71.6 kg    BMI:  Body mass index is 27.09 kg/m.  Estimated Nutritional Needs:  Kcal:  2000-2300 kcals Protein:  85-100  grams Fluid:  >/= 2L    Samson Frederic RD, LDN For contact information, refer to [facility].

## 2022-11-27 NOTE — Assessment & Plan Note (Addendum)
No improvement. CT head normal, but have to assume this is related to her skull base tumor/plasmacytoma - Hold long-acting psychotropic meds Rectogesic - Continue IV fluids and antibiotics - Obtain EEG

## 2022-11-27 NOTE — Progress Notes (Signed)
Daily Progress Note   Patient Name: Michelle [name]       Date: 11/27/2022 DOB: Oct 31, 1961  Age: 62 y.o. MRN#: [id number] Attending Physician: Edwin Dada, * Primary Care Physician: Leeroy Cha, MD Admit Date: 11/20/2022  Reason for Consultation/Follow-up: Establishing goals of care  Subjective:  Confused, resting in bed, daughter [name] at bedside.   Length of Stay: 2  Current Medications: Scheduled Meds:   Chlorhexidine Gluconate Cloth  6 each Topical Q0600   levothyroxine  100 mcg Oral Q0600   metoprolol tartrate  2.5 mg Intravenous Q6H   sodium bicarbonate  650 mg Oral Daily   thiamine (VITAMIN B1) injection  100 mg Intravenous Daily    Continuous Infusions:  azithromycin Stopped (11/27/22 0203)   cefTRIAXone (ROCEPHIN)  IV Stopped (11/26/22 2029)   dextrose 5 % and 0.45% NaCl 125 mL/hr at 11/27/22 1216    PRN Meds: acetaminophen **OR** acetaminophen, HYDROmorphone (DILAUDID) injection, LORazepam  Physical Exam         Weak appearing lady  Resting in bed Does not open eyes or interact Does not verbalize No distress No edema  Vital Signs: BP (!) 165/101 (BP Location: Right Arm)   Pulse (!) 126   Temp 99.1 F (37.3 C) (Axillary)   Resp (!) 37   Wt 71.6 kg   SpO2 98%   BMI 27.09 kg/m  SpO2: SpO2: 98 % O2 Device: O2 Device: Room Air O2 Flow Rate:    Intake/output summary:  Intake/Output Summary (Last 24 hours) at 11/27/2022 1218 Last data filed at 11/27/2022 1216 Gross per 24 hour  Intake 1956.64 ml  Output 700 ml  Net 1256.64 ml   LBM: Last BM Date : 11/26/22 Baseline Weight: Weight: 71.6 kg Most recent weight: Weight: 71.6 kg       Palliative Assessment/Data:      Patient Active Problem List   Diagnosis Date Noted    Hypocalcemia 11/27/2022   Thrombocytopenia ([place]) 11/27/2022   Hypophosphatemia 11/27/2022   Hypernatremia 93/23/5573   Acute metabolic encephalopathy 22/12/5425   Acute renal failure superimposed on stage 3b chronic kidney disease ([place]) 11/26/2022   Fall at home, initial encounter 11/26/2022   Left humeral fracture 11/26/2022   Failure to thrive in adult 11/26/2022   Sepsis with end organ damage 11/09/2022   UTI (urinary tract  infection) 11/18/2022   Palliative care by specialist 10/21/2022   Goals of care, counseling/discussion 10/21/2022   Malignant pleural effusion 10/01/2022   Stage 3a chronic kidney disease (CKD) ([place]) 10/01/2022   GERD (gastroesophageal reflux disease) 10/01/2022   Anxiety 10/01/2022   Anemia of chronic renal failure, stage 3b ([place]) 08/02/2022   Pleural effusion 06/25/2022   Multiple myeloma without remission ([place]) 06/20/2022   Intractable pain 06/14/2022   Hypercalcemia 06/14/2022   AKI (acute kidney injury) ([place]) 58/07/9832   Metabolic acidosis 82/50/5397   Tachycardia 06/14/2022   Metastasis to bone ([place]) 06/10/2022   Refractory anemia ([place]) 05/17/2022   Hyperlipidemia 02/16/2017   Morbid obesity ([place]) 02/16/2017   Essential hypertension 02/16/2017   Hypothyroidism 02/16/2017    Palliative Care Assessment & Plan   Patient Profile:    Assessment:  Mrs. Folz is a 62 y.o. 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      1/22: Admitted on antibiotics for ?sepsis, AKI 1/23: Oncology and Palliative care consulted, still densely encephalopathic 1/24: Mentation no change, Cr slightly better    Recommendations/Plan: PMT following for [place]  discussions, patient known to PMT service from recent previous hospitalization. Full code, full scope for now, Dr Marin Olp following closely, will continue to monitor hospital course so as to help assist with further [place] discussions.    Goals of Care and Additional Recommendations: Limitations on Scope of Treatment: Full Scope Treatment  Code Status:    Code Status Orders  (From admission, onward)           Start     Ordered   11/09/2022 2310  Full code  Continuous       Question:  By:  Answer:  Consent: discussion documented in EHR   11/17/2022 2311           Code Status History     Date Active Date Inactive Code Status Order ID Comments User Context   10/17/2022 2109 10/31/2022 2132 Full Code [id number]  Kayleen Memos, DO ED   10/01/2022 1130 10/03/2022 2139 Full Code [id number]  Jonnie Finner, DO Inpatient   06/14/2022 2137 06/26/2022 2108 Full Code [id number]  Orene Desanctis, DO Inpatient      Advance Directive Documentation    Flowsheet Row Most Recent Value  Type of Advance Directive Living will  Pre-existing out of facility DNR order (yellow form or pink MOST form) --  "MOST" Form in Place? --       Prognosis:  guarded  Discharge Planning: To Be Determined  Care plan was discussed with  daughter  Thank you for allowing the Palliative Medicine Team to assist in the care of this patient.  Mod MDM     Greater than 50%  of this time was spent counseling and coordinating care related to the above assessment and plan.  Loistine Chance, MD  Please contact Palliative Medicine Team phone at [phone] for questions and concerns.

## 2022-11-27 NOTE — Assessment & Plan Note (Signed)
-  Consult palliative care, appreciate recommendations

## 2022-11-27 NOTE — Progress Notes (Signed)
Michelle [name] is now in the ICU.  She still has a lot of confusion.  I think she is also having problems with pain.  I am unsure we can really help with the pain outside of pain medications.  She really needs to have surgery for these pathologic fractures.  Unfortunately, I do still think that she is in any shape to have surgery.  Her cultures are still all negative.She is not eating anything.  Again, she has confusion.  She is on Zithromax and Rocephin.  She is not sure who I am.  Of note, her TSH was quite high at 20.  Synthroid dose has been increased to 100 mcg.  Her labs show a calcium is still low at 7.  Her albumin is 2.7.  Her BUN is 29 creatinine 3.24.  This seems to be improving a little bit.  Her chloride is going up.  Her sodium is also going up.  Her white cell count is 7.2.  Hemoglobin 9.6.  Platelet count 127,000.  It is really hard to say how much better she is going to get from a cognitive point of view.  The way she has right now, I do not think we will ever be able to treat her for the myeloma.  She is on a 2-week break between cycles right now.  There is no bleeding.  There is no diarrhea.  She is not throwing up.  Her blood sugars have been on the low side.  This morning the blood sugar is 106.  Her vital signs show temperature of 97.7.  Pulse 111.  Blood pressure 103/65.  Her lungs sound pretty decent bilaterally.  Cardiac exam is tachycardic but regular.  Abdomen is soft.  Bowel sounds are present.  There is no fluid wave.  There is no palpable liver or spleen tip.  Extremity shows the left arm in a cast.  Neurological exam shows no focal deficits.  She still has some global cognitive disorder.  Michelle [name] has a aggressive myeloma.  We really had a hard time treating her.  She has pathologic fractures.  She still has his change in mental state.  The CT of the brain was negative.  Her thyroid was on the low side so maybe, increasing the Synthroid will help.  We not yet found  any obvious infection although on her admission chest x-ray, there was a possible infiltrate I think over on the left side.  I am still willing to give her couple days to see if she improves.  If not, then I think we will have to talk with her family about changing her focus and thinking about comfort care.  I know her family has been incredibly involved with her care.  They have done a wonderful job with her.  They have been incredibly motivated to try to get her better.  I know that the ICU staff are doing a fantastic job with her.  I appreciate all of the compassion that they have in this very challenging situation.   Michelle Haw, MD  Michelle [name] 3:17

## 2022-11-27 NOTE — Assessment & Plan Note (Signed)
-  Maintain arm in sling

## 2022-11-27 NOTE — Progress Notes (Signed)
OT Cancellation Note  Patient Details Name: REILY TRELOAR MRN: [id number] DOB: Mar 08, 1961   Cancelled Treatment:    Reason Eval/Treat Not Completed: Other (comment). Patient is currently unable to follow commands due to severe altered mental status. Patient known to therapy from prior admission in which she discharged home with hospital bed, BSC and hoyer. Patient is NWB to LUE and was made NWB to bilateral lower extremities due to potential for pathological fractures. he has been taking care of by daughters mostly at bed level at home. Daughter reports she had been able to get up to [facility] without pain. She is currently unable to follow commands due to altered mental status. Unsure what therapy can provide at this time as daughters educated on prior visit on how to care for patient at bed level.   If patient's cognition was to improve and patient's NWB status was removed to lower extremities - patient MAY benefit from therapy. Until then OT is signing off.   Ashtin Melichar L Wadell Craddock 11/27/2022, 12:21 PM

## 2022-11-27 NOTE — Assessment & Plan Note (Signed)
-  Hold Crestor 

## 2022-11-27 NOTE — Assessment & Plan Note (Signed)
Hemoglobin stable, no clinical bleeding

## 2022-11-27 NOTE — Hospital Course (Addendum)
Michelle [name] is a 62 y.o. 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    1/22: Admitted on antibiotics for ?sepsis, AKI 1/23: Oncology and Palliative care consulted, still densely encephalopathic 1/24: Mentation no change, Cr slightly better 1/25: No change, CT shows new large plasmacytomas in right lung 1/26: Dilaudid infusion started 1/27: Transitioned to comfort measures

## 2022-11-27 NOTE — Assessment & Plan Note (Signed)
Ucx with serratia, unclear if this is incidental or not.  CT chest today shows new bulky plasmacytomas in right lung, significant compressive atelectasis and ?pneumonia - Continue azithromycin - Broaden to cefepime

## 2022-11-27 NOTE — Assessment & Plan Note (Signed)
- 

## 2022-11-28 ENCOUNTER — Other Ambulatory Visit: Payer: Self-pay

## 2022-11-28 ENCOUNTER — Encounter (HOSPITAL_COMMUNITY): Payer: Self-pay | Admitting: Internal Medicine

## 2022-11-28 ENCOUNTER — Inpatient Hospital Stay (HOSPITAL_COMMUNITY): Payer: BC Managed Care – PPO

## 2022-11-28 DIAGNOSIS — C9000 Multiple myeloma not having achieved remission: Secondary | ICD-10-CM | POA: Diagnosis not present

## 2022-11-28 DIAGNOSIS — G9341 Metabolic encephalopathy: Secondary | ICD-10-CM | POA: Diagnosis not present

## 2022-11-28 DIAGNOSIS — S42202G Unspecified fracture of upper end of left humerus, subsequent encounter for fracture with delayed healing: Secondary | ICD-10-CM | POA: Diagnosis not present

## 2022-11-28 DIAGNOSIS — R4182 Altered mental status, unspecified: Secondary | ICD-10-CM | POA: Diagnosis not present

## 2022-11-28 DIAGNOSIS — A419 Sepsis, unspecified organism: Secondary | ICD-10-CM | POA: Diagnosis not present

## 2022-11-28 DIAGNOSIS — J910 Malignant pleural effusion: Secondary | ICD-10-CM | POA: Diagnosis not present

## 2022-11-28 LAB — CBC WITH DIFFERENTIAL/PLATELET
Abs Immature Granulocytes: 0.1 10*3/uL — ABNORMAL HIGH (ref 0.00–0.07)
Basophils Absolute: 0 10*3/uL (ref 0.0–0.1)
Basophils Relative: 1 %
Eosinophils Absolute: 0 10*3/uL (ref 0.0–0.5)
Eosinophils Relative: 1 %
HCT: 30.6 % — ABNORMAL LOW (ref 36.0–46.0)
Hemoglobin: 9.7 g/dL — ABNORMAL LOW (ref 12.0–15.0)
Immature Granulocytes: 2 %
Lymphocytes Relative: 7 %
Lymphs Abs: 0.5 10*3/uL — ABNORMAL LOW (ref 0.7–4.0)
MCH: 28.6 pg (ref 26.0–34.0)
MCHC: 31.7 g/dL (ref 30.0–36.0)
MCV: 90.3 fL (ref 80.0–100.0)
Monocytes Absolute: 2.1 10*3/uL — ABNORMAL HIGH (ref 0.1–1.0)
Monocytes Relative: 32 %
Neutro Abs: 3.8 10*3/uL (ref 1.7–7.7)
Neutrophils Relative %: 57 %
Platelets: 161 10*3/uL (ref 150–400)
RBC: 3.39 MIL/uL — ABNORMAL LOW (ref 3.87–5.11)
RDW: 23.8 % — ABNORMAL HIGH (ref 11.5–15.5)
WBC: 6.5 10*3/uL (ref 4.0–10.5)
nRBC: 0.5 % — ABNORMAL HIGH (ref 0.0–0.2)

## 2022-11-28 LAB — COMPREHENSIVE METABOLIC PANEL
ALT: 14 U/L (ref 0–44)
AST: 25 U/L (ref 15–41)
Albumin: 2.6 g/dL — ABNORMAL LOW (ref 3.5–5.0)
Alkaline Phosphatase: 89 U/L (ref 38–126)
Anion gap: 11 (ref 5–15)
BUN: 24 mg/dL — ABNORMAL HIGH (ref 8–23)
CO2: 17 mmol/L — ABNORMAL LOW (ref 22–32)
Calcium: 6.5 mg/dL — ABNORMAL LOW (ref 8.9–10.3)
Chloride: 119 mmol/L — ABNORMAL HIGH (ref 98–111)
Creatinine, Ser: 2.92 mg/dL — ABNORMAL HIGH (ref 0.44–1.00)
GFR, Estimated: 17 mL/min — ABNORMAL LOW (ref >60)
Glucose, Bld: 95 mg/dL (ref 70–99)
Potassium: 3.9 mmol/L (ref 3.5–5.1)
Sodium: 147 mmol/L — ABNORMAL HIGH (ref 135–145)
Total Bilirubin: 0.6 mg/dL (ref 0.3–1.2)
Total Protein: 5.3 g/dL — ABNORMAL LOW (ref 6.5–8.1)

## 2022-11-28 LAB — GLUCOSE, CAPILLARY
Glucose-Capillary: 81 mg/dL (ref 70–99)
Glucose-Capillary: 83 mg/dL (ref 70–99)
Glucose-Capillary: 88 mg/dL (ref 70–99)
Glucose-Capillary: 91 mg/dL (ref 70–99)
Glucose-Capillary: 93 mg/dL (ref 70–99)

## 2022-11-28 LAB — BLOOD GAS, VENOUS
Acid-base deficit: 5.7 mmol/L — ABNORMAL HIGH (ref 0.0–2.0)
Bicarbonate: 18.3 mmol/L — ABNORMAL LOW (ref 20.0–28.0)
O2 Saturation: 71.5 %
Patient temperature: 37
pCO2, Ven: 31 mmHg — ABNORMAL LOW (ref 44–60)
pH, Ven: 7.38 (ref 7.25–7.43)
pO2, Ven: 40 mmHg (ref 32–45)

## 2022-11-28 LAB — URINE CULTURE: Culture: >=100000 — AB

## 2022-11-28 MED ORDER — DEXTROSE-NACL 5-0.45 % IV SOLN: INTRAVENOUS | Status: DC

## 2022-11-28 MED ORDER — SODIUM CHLORIDE 0.9 % IV SOLN
2.0000 g | INTRAVENOUS | Status: AC
  Administered 2022-11-28 – 2022-11-29 (×2): 2 g via INTRAVENOUS
  Filled 2022-11-28 (×2): qty 12.5

## 2022-11-28 MED ORDER — METHOCARBAMOL 1000 MG/10ML IJ SOLN
500.0000 mg | Freq: Once | INTRAVENOUS | Status: AC
  Administered 2022-11-28: 500 mg via INTRAVENOUS
  Filled 2022-11-28: qty 500

## 2022-11-28 MED ORDER — LEVALBUTEROL HCL 0.63 MG/3ML IN NEBU
0.6300 mg | INHALATION_SOLUTION | Freq: Once | RESPIRATORY_TRACT | Status: AC
  Administered 2022-11-28: 0.63 mg via RESPIRATORY_TRACT
  Filled 2022-11-28: qty 3

## 2022-11-28 NOTE — TOC Initial Note (Signed)
Transition of Care [facility]) - Initial/Assessment Note    Patient Details  Name: Michelle [name] MRN: [id number] Date of Birth: 06/22/1961  [facility]) CM/SW Contact:    Dessa Phi, RN Phone Number: 11/28/2022, 1:33 PM  Clinical Narrative:  From home w/family;noted NWB, has hoyer lift 2 home.follow for d/c plans.                 Expected Discharge Plan: Home/Self Care Barriers to Discharge: Continued Medical Work up   Patient Goals and CMS Choice Patient states their goals for this hospitalization and ongoing recovery are::  (Home)          Expected Discharge Plan and Services                                              Prior Living Arrangements/Services                       Activities of Daily Living Home Assistive Devices/Equipment: Other (Comment) (UTA) ADL Screening (condition at time of admission) Patient's cognitive ability adequate to safely complete daily activities?: No Is the patient deaf or have difficulty hearing?: No Does the patient have difficulty seeing, even when wearing glasses/contacts?: No Does the patient have difficulty concentrating, remembering, or making decisions?: Yes Patient able to express need for assistance with ADLs?: No Does the patient have difficulty dressing or bathing?: Yes Independently performs ADLs?: No Communication: Independent Dressing (OT): Needs assistance Is this a change from baseline?: Change from baseline, expected to last <3days Grooming: Needs assistance Is this a change from baseline?: Change from baseline, expected to last <3 days Feeding: Needs assistance Is this a change from baseline?: Change from baseline, expected to last <3 days Bathing: Needs assistance Is this a change from baseline?: Change from baseline, expected to last <3 days Toileting: Needs assistance Is this a change from baseline?: Change from baseline, expected to last <3 days In/Out Bed: Needs assistance Is this  a change from baseline?: Change from baseline, expected to last <3 days Walks in Home: Needs assistance Is this a change from baseline?: Change from baseline, expected to last <3 days Does the patient have difficulty walking or climbing stairs?: Yes Weakness of Legs: Both Weakness of Arms/Hands: Both  Permission Sought/Granted                  Emotional Assessment              Admission diagnosis:  Failure to thrive in adult [R62.7] Acute cystitis without hematuria [N30.00] Sepsis ([place]) [A41.9] Altered mental status, unspecified altered mental status type [R41.82] Patient Active Problem List   Diagnosis Date Noted   Hypocalcemia 11/27/2022   Thrombocytopenia ([place]) 11/27/2022   Hypophosphatemia 11/27/2022   Hypernatremia 11/27/2022   Malnutrition of moderate degree 62/69/4854   Acute metabolic encephalopathy 62/70/3500   Acute renal failure superimposed on stage 3b chronic kidney disease ([place]) 11/26/2022   Fall at home, initial encounter 11/26/2022   Left humeral fracture 11/26/2022   Failure to thrive in adult 11/26/2022   Sepsis with end organ damage 11/24/2022   UTI (urinary tract infection) 12/02/2022   Palliative care by specialist 10/21/2022   Goals of care, counseling/discussion 10/21/2022   Malignant pleural effusion 10/01/2022   Stage 3a chronic kidney disease (CKD) ([place]) 10/01/2022   GERD (gastroesophageal reflux disease)  10/01/2022   Anxiety 10/01/2022   Anemia of chronic renal failure, stage 3b ([place]) 08/02/2022   Pleural effusion 06/25/2022   Multiple myeloma without remission ([place]) 06/20/2022   Intractable pain 06/14/2022   Hypercalcemia 06/14/2022   AKI (acute kidney injury) ([place]) 70/48/8891   Metabolic acidosis 69/45/0388   Tachycardia 06/14/2022   Metastasis to bone ([place]) 06/10/2022   Refractory anemia ([place]) 05/17/2022   Hyperlipidemia 02/16/2017   Morbid obesity ([place]) 02/16/2017   Essential hypertension 02/16/2017   Hypothyroidism 02/16/2017    PCP:  Leeroy Cha, MD Pharmacy:   [id number] [id number]: [phone]: [phone]    Social Determinants of Health ([place] Social History: [place] No Food Insecurity (11/27/2022)  Housing: Low Risk  (10/18/2022)  Transportation Needs: No Transportation Needs (10/18/2022)  Utilities: Not At Risk (10/18/2022)  Tobacco Use: Medium Risk (11/28/2022)   SDOH Interventions: Housing Interventions: Intervention Not Indicated   Readmission Risk Interventions    10/23/2022    1:47 PM 10/03/2022    8:34 AM  Readmission Risk Prevention Plan  Transportation Screening Complete Complete  PCP or Specialist Appt within 3-5 Days  Complete  HRI or HIntercourse Complete  Social Work Consult for [place]anning/Counseling  Complete  Palliative Care Screening  Not Applicable  Medication Review (Press photographer Complete Complete  PCP or Specialist appointment within 3-5 days of discharge Complete   HRI or HWest [place]isComplete   SW Recovery Care/Counseling Consult Complete   [place] Applicable

## 2022-11-28 NOTE — Procedures (Signed)
Patient Name: AKILA BATTA  MRN: [id number]  Epilepsy Attending: Lora Havens  Referring Physician/Provider: Edwin Dada, MD  Date: 11/27/2022 Duration: 21.33 mins  Patient history: 62yo F with ams. EEG to evaluate for seizure  Level of alertness: Awake, asleep  AEDs during EEG study: Ativan  Technical aspects: This EEG study was done with scalp electrodes positioned according to the 10-20 International system of electrode placement. Electrical activity was reviewed with band pass filter of 1-'70Hz'$ , sensitivity of 7 uV/mm, display speed of 26m/sec with a '60Hz'$  notched filter applied as appropriate. EEG data were recorded continuously and digitally stored.  Video monitoring was available and reviewed as appropriate.  Description: The posterior dominant rhythm consists of 7 Hz activity of moderate voltage (25-35 uV) seen predominantly in posterior head regions, symmetric and reactive to eye opening and eye closing. Sleep was characterized by vertex waves, sleep spindles (12 to 14 Hz), maximal frontocentral region. EEG showed continuous generalized 5 to 7 Hz theta slowing. Hyperventilation and photic stimulation were not performed.     ABNORMALITY - Continuous slow, generalized  IMPRESSION: This study is suggestive of moderate diffuse encephalopathy, nonspecific etiology. No seizures or epileptiform discharges were seen throughout the recording.  Nishaan Stanke OBarbra Sarks

## 2022-11-28 NOTE — Progress Notes (Signed)
Progress Note   Patient: Michelle [name] XAJ:[id number] DOB: 07-Aug-1961 DOA: 11/26/2022     3 DOS: the patient was seen and examined on 11/28/2022 at 9:43AM      Brief hospital course: Mrs. Anastos is a 62 y.o. 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    1/22: Admitted on antibiotics for ?sepsis, AKI 1/23: Oncology and Palliative care consulted, still densely encephalopathic 1/24: Mentation no change, Cr slightly better 1/25: No change, CT shows new large plasmacytomas in right lung     Assessment and Plan: * Possible sepsis with end organ damage Ucx with serratia, unclear if this is incidental or not.  CT chest today shows new bulky plasmacytomas in right lung, significant compressive atelectasis and ?pneumonia - Continue azithromycin - Broaden to cefepime    Acute metabolic encephalopathy No change.  CT head normal.  EEG negative. - Hold long-acting psychotropic meds such as duragesic - Continue IV fluids and antibiotics   Multiple myeloma without remission ([place]) This has failed treatment - Recommend hospice and comfort cares  Left humeral fracture - Analgesics  Acute renal failure superimposed on stage 3b chronic kidney disease ([place]) Patient renal failure during admission last month, creatinine up to 7, slowly been trending down, but not yet back to baseline 1.2-1.4.  Continues to slowly trend down.  Malnutrition of moderate degree    Hypernatremia Sodium 147, hyperchloremia noted, still appears dehydrated - Continue hypotonic fluids  Hypophosphatemia Supplemented    Thrombocytopenia (HCC)    Hypocalcemia    Failure to thrive in adult -  Consult palliative care, appreciate recommendations  Fall at home, initial encounter    Anxiety     Malignant pleural effusion     Anemia of chronic renal failure, stage 3b (HCC) Hemoglobin stable, no clinical bleeding  Tachycardia Acute on chronic - Continue metoprolol  Metabolic acidosis Due to renal failure - Continue bicarb  Hypothyroidism - Continue levothyroxine  Essential hypertension Blood pressure elevated - Continue metoprolol  Hyperlipidemia - Hold Crestor          Subjective: Wheezing and coughing overnight, no improvement in mentation.  No fever.  No hypoxia.     Physical Exam: BP 126/75 (BP Location: Right Arm)   Pulse (!) 106   Temp 98.5 F (36.9 C) (Axillary)   Resp 10   Ht '5\' 4"'$  (1.626 m)   Wt 73 kg   SpO2 100%   BMI 27.62 kg/m   Adult female, lying in bed, restless and agitated Tachycardic, no murmurs, no peripheral edema Respiratory rate seems increased, no wheezing appreciated, lung sounds diminished throughout, no rales appreciated Abdomen soft without grimace to palpation Does not track me with her eyes, groans occasionally, has some agitated spontaneous movements, but no purposeful movements, no intelligible utterances, does not follow commands    Data Reviewed: I communicated new findings to oncology Basic metabolic panel shows unchanged hyponatremia, elevated chloride, creatinine down to 2.9 Chest x-ray shows worsening right opacities, follow-up with CT chest shows that these are in fact large plasmacytomas Hemogram shows persistent anemia, no change, white blood cell count normal Urine culture growing Serratia   Family Communication: Daughter Urban Gibson at the bedside and by phone    Disposition: Status is: Inpatient The patient was  admitted with encephalopathy  There was some consideration that this might be from infection, although she has not responded to appropriate treatment for the last 2 days, and I find  this exceedingly unlikely.  More likely this is terminal delirium from her MM, and I recommend comfort care        Author: Edwin Dada, MD 11/28/2022 2:10 PM  For on call review [email or web address].

## 2022-11-28 NOTE — Progress Notes (Signed)
Unfortunately, despite treating the urinary tract infection, Ms. Lager still really has not come around much.  She still is not saying anything to me.  She still has movements that really are not purposeful.  She had EEG yesterday.  I know she had a CT scan of the brain last week which was negative.  She has Serratia in the urine.  She is on antibiotics.  We will have to see what the sensitivities are.  She is not eating.  I really worried that she is not going to pull out of this much.  I hate to see her suffer.  He does see her miserable and exist.  Her labs show a sodium 147.  Potassium 3.9.  BUN 24 creatinine 2.92.  Calcium is 6.5 with an albumin of 2.6.  White cell count is 6.5.  Hemoglobin 9.7.  Platelet count 161,000.  She certainly is not hypotensive.  She really cannot tell us where she is hurting.  There is no bleeding.  Again, I want to try to give her a chance to improve she can.  We will have to see what the sensitivities are for the Serratia.  I would think that she is on the right antibiotics.  I think that if things do not improve with her overall quality of life or performance status in a couple days, then I think we are really going to have to think about our philosophy as to how to help or.  I just would not think that she is a candidate for any further therapy.  I know that the ICU staff are doing a great job in trying to get her better.  So far, we have just not had much improvement.  Again I do not see that she is a candidate for any surgery for these fractures.  Her prealbumin on the 23rd was 5.  Again, I just want to give her a chance.    [name], MD  Psalms 6:2

## 2022-11-28 NOTE — Progress Notes (Signed)
Pharmacy Antibiotic Note  LASHAWNTA [name] is a 61 y.o. female admitted on 12/04/2022 with sepsis and UTI.  Pharmacy has been consulted for cefepime dosing. Changing rocephin to cefepime due to serratia UTI. Continue zithromax x 5 days for CAP  Plan: Cefepime 2g IV q24 per current renal function  Height: '5\' 4"'$  (162.6 cm) Weight: 73 kg (160 lb 15 oz) IBW/kg (Calculated) : 54.7  Temp (24hrs), Avg:98.1 F (36.7 C), Min:97.7 F (36.5 C), Max:99.6 F (37.6 C)  Recent Labs  Lab 11/22/22 1053 11/15/2022 1946 11/26/2022 2156 11/27/2022 2353 11/27/22 0449 11/28/22 0529  WBC 6.0 6.7  --  6.6 7.2 6.5  CREATININE 4.14* 3.74*  --  3.67*  3.63* 3.24* 2.92*  LATICACIDVEN  --  1.4 1.6  --   --   --     Estimated Creatinine Clearance: 19.3 mL/min (A) (by C-G formula based on SCr of 2.92 mg/dL (H)).    Allergies  Allergen Reactions   Penicillins Other (See Comments)    Severe Yeast infection   Codeine Nausea Only   Morphine Other (See Comments)    headache   Tramadol Other (See Comments)    Headaches       Thank you for allowing pharmacy to be a part of this patient's care.  Kara Mead 11/28/2022 10:10 AM

## 2022-11-29 DIAGNOSIS — S42202B Unspecified fracture of upper end of left humerus, initial encounter for open fracture: Secondary | ICD-10-CM | POA: Diagnosis not present

## 2022-11-29 DIAGNOSIS — C9000 Multiple myeloma not having achieved remission: Secondary | ICD-10-CM | POA: Diagnosis not present

## 2022-11-29 LAB — CBC WITH DIFFERENTIAL/PLATELET
Abs Immature Granulocytes: 0.06 10*3/uL (ref 0.00–0.07)
Basophils Absolute: 0 10*3/uL (ref 0.0–0.1)
Basophils Relative: 0 %
Eosinophils Absolute: 0 10*3/uL (ref 0.0–0.5)
Eosinophils Relative: 0 %
HCT: 29.5 % — ABNORMAL LOW (ref 36.0–46.0)
Hemoglobin: 9.5 g/dL — ABNORMAL LOW (ref 12.0–15.0)
Immature Granulocytes: 1 %
Lymphocytes Relative: 6 %
Lymphs Abs: 0.3 10*3/uL — ABNORMAL LOW (ref 0.7–4.0)
MCH: 29.1 pg (ref 26.0–34.0)
MCHC: 32.2 g/dL (ref 30.0–36.0)
MCV: 90.2 fL (ref 80.0–100.0)
Monocytes Absolute: 1.6 10*3/uL — ABNORMAL HIGH (ref 0.1–1.0)
Monocytes Relative: 35 %
Neutro Abs: 2.6 10*3/uL (ref 1.7–7.7)
Neutrophils Relative %: 58 %
Platelets: 159 10*3/uL (ref 150–400)
RBC: 3.27 MIL/uL — ABNORMAL LOW (ref 3.87–5.11)
RDW: 24.4 % — ABNORMAL HIGH (ref 11.5–15.5)
WBC: 4.6 10*3/uL (ref 4.0–10.5)
nRBC: 0.4 % — ABNORMAL HIGH (ref 0.0–0.2)

## 2022-11-29 LAB — COMPREHENSIVE METABOLIC PANEL
ALT: 12 U/L (ref 0–44)
AST: 24 U/L (ref 15–41)
Albumin: 2.8 g/dL — ABNORMAL LOW (ref 3.5–5.0)
Alkaline Phosphatase: 93 U/L (ref 38–126)
Anion gap: 9 (ref 5–15)
BUN: 22 mg/dL (ref 8–23)
CO2: 16 mmol/L — ABNORMAL LOW (ref 22–32)
Calcium: 6.3 mg/dL — CL (ref 8.9–10.3)
Chloride: 122 mmol/L — ABNORMAL HIGH (ref 98–111)
Creatinine, Ser: 2.63 mg/dL — ABNORMAL HIGH (ref 0.44–1.00)
GFR, Estimated: 20 mL/min — ABNORMAL LOW (ref >60)
Glucose, Bld: 82 mg/dL (ref 70–99)
Potassium: 3.4 mmol/L — ABNORMAL LOW (ref 3.5–5.1)
Sodium: 147 mmol/L — ABNORMAL HIGH (ref 135–145)
Total Bilirubin: 0.5 mg/dL (ref 0.3–1.2)
Total Protein: 5.5 g/dL — ABNORMAL LOW (ref 6.5–8.1)

## 2022-11-29 LAB — GLUCOSE, CAPILLARY
Glucose-Capillary: 84 mg/dL (ref 70–99)
Glucose-Capillary: 79 mg/dL (ref 70–99)
Glucose-Capillary: 78 mg/dL (ref 70–99)

## 2022-11-29 MED ORDER — MORPHINE SULFATE (CONCENTRATE) 10 MG/0.5ML PO SOLN: 10.0000 mg | ORAL | Status: DC | PRN

## 2022-11-29 MED ORDER — GLYCOPYRROLATE 0.2 MG/ML IJ SOLN
0.1000 mg | Freq: Four times a day (QID) | INTRAMUSCULAR | Status: DC | PRN
  Administered 2022-11-30 – 2022-12-02 (×5): 0.1 mg via INTRAVENOUS
  Filled 2022-11-29 (×5): qty 1

## 2022-11-29 MED ORDER — HYDROMORPHONE HCL-NACL 50-0.9 MG/50ML-% IV SOLN
1.5000 mg/h | INTRAVENOUS | Status: DC
  Administered 2022-11-29: 0.5 mg/h via INTRAVENOUS
  Administered 2022-11-30 – 2022-12-02 (×3): 1 mg/h via INTRAVENOUS
  Administered 2022-12-04 – 2022-12-05 (×2): 1.5 mg/h via INTRAVENOUS
  Filled 2022-11-29 (×6): qty 50

## 2022-11-29 MED ORDER — ORAL CARE MOUTH RINSE: 15.0000 mL | OROMUCOSAL | Status: DC | PRN

## 2022-11-29 NOTE — Progress Notes (Signed)
I think that the CT scan that she had over chest x-ray clearly shows [place] where things are going.  She clearly has progressive disease with respect to her myeloma.  She has plasmacytomas at her groin quickly.  Her overall state of mind still is not anywhere close to baseline.  She still has this confusion.  There is still some encephalopathy.  She is being treated for the Serratia urinary tract infection.  Despite this, she still has not really improved with her mental status.  She is not hypercalcemic.  Her BUN and creatinine are holding steady at 22 and 2.63.  Her calcium she will be on the lower side.  Her sodium and chloride are on the high side.  Her white cell count is 4.6.  Hemoglobin 9.5.  Platelet count 159,000.  I, unfortunate, just do not see how we are going to improve her overall status with respect to the myeloma.  He is requiring medication hourly for pain.  I suspect she probably needs to be on a morphine or Dilaudid infusion.  I know that she has tried.  I know that her family has tried to help her.  This is obviously an incredibly aggressive myeloma.  I think the CT of the chest is indicative of this.  I will have to try to talk to her family and see how we can try to come to an agreement as to how to help her at this point.  I know her birthday is coming up on Monday.  I have to believe that she is going to be a candidate for Hospice.  I would not think that she would be able to be cared for at home.  I know this is a very difficult situation.  I know that everybody is trying incredibly hard to get her better.  We just are dealing with a malignant process that is not really responding to treatment and is causing her to decline and I really do not see that she is going to improve.  I wish she would have no who I was today.  I do not think she really realize who I was.  She just stared at me and stared into space.  I know the staff in the ICU are doing a great job and trying to  help her and trying to keep her comfortable.  She really has not eaten.  I do not see a role for putting a feeding tube into her.  I just do not believe that this would benefit her quality of life.  I think it would just prolong her existing.  I do appreciate everybody's help in trying to get Ms. Waguespack to feel better.  This is truly the "dark side" of what cancer can do.  I just want her to have comfort, respect, and dignity.  [name], MD  2 Timothy 4:6-8

## 2022-11-29 NOTE — Progress Notes (Signed)
  Progress Note   Patient: Michelle [name] [id number]:[id number] DOB: 02/07/1961 DOA: 11/24/2022     4 DOS: the patient was seen and examined on 11/29/2022        Brief hospital course: Mrs. Mennella is a 62 y.o. 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    1/22: Admitted on antibiotics for ?sepsis, AKI 1/23: Oncology and Palliative care consulted, still densely encephalopathic 1/24: Mentation no change, Cr slightly better 1/25: No change, CT shows new large plasmacytomas in right lung     Assessment and Plan: * Possible sepsis with end organ damage - Continue antibiotics to complete 5 days     Acute metabolic encephalopathy No improvement with antibiotic treatment.  Serum chemistries show no significant metabolic derangement.  CT head normal.  EEG negative.  Suspect this is terminal delirium from leukemic transformation of her myeloma.   Multiple myeloma without remission ([place]) See above.  Discussed with Oncology, her functional status is too poor at this point to tolerate chemotherapy.  Left humeral fracture  Acute renal failure superimposed on stage 3b chronic kidney disease (HCC) Cr trending down slowly  Malnutrition of moderate degree    Hypernatremia Sodium 147, unchanged  Hypophosphatemia  Thrombocytopenia (HCC)  Hypocalcemia   Failure to thrive in adult  Anxiety    Malignant pleural effusion   Anemia of chronic renal failure, stage 3b (HCC) Hemoglobin stable, no clinical bleeding  Tachycardia - Continue metoprolol  Metabolic acidosis - Continue bicarb as able  Hypothyroidism - Continue levothyroxine  Essential hypertension Blood pressure  elevated - Continue metoprolol  Hyperlipidemia - Hold Crestor          Subjective: Patient remains encephalopathic.  No fever.  More wheezing, more apparent dyspnea through the day.  Frequently agitated and in pain.  Appears to be suffering.     Physical Exam: BP (!) 159/91   Pulse (!) 124   Temp 98.4 F (36.9 C) (Axillary)   Resp (!) 23   Ht '5\' 4"'$  (1.626 m)   Wt 74.1 kg   SpO2 100%   BMI 28.04 kg/m   Adult female, lying in bed, opens eyes but does not make eye contact.  Tachycardic, regular, no murmurs, no LE edema. Tachypneic, wheezing, shallow Abdomen without grimace, seems soft. Attention diminished, no following commands.  Data Reviewed: Unchanged from yesterday.  Family Communication: Daughter at bedside, I have made calls to other daughters earlier in the week without answer, and daughter [name] at bedside has asked that I not call them to allow them to process.  We reviewed code status, and daughter POA reports that she and her sisters feel their mother would not have wanted to change her code status.     Disposition: Status is: Inpatient Patient appears to be actively dying, we will start increased dilaudid use, finish antibiotics, continue fluids and continue goals of care discussions.            Author: Edwin Dada, MD 11/29/2022 4:17 PM  For on call review [email or web address]

## 2022-11-30 DIAGNOSIS — R4182 Altered mental status, unspecified: Secondary | ICD-10-CM | POA: Diagnosis not present

## 2022-11-30 DIAGNOSIS — S42202G Unspecified fracture of upper end of left humerus, subsequent encounter for fracture with delayed healing: Secondary | ICD-10-CM | POA: Diagnosis not present

## 2022-11-30 DIAGNOSIS — R627 Adult failure to thrive: Secondary | ICD-10-CM | POA: Diagnosis not present

## 2022-11-30 DIAGNOSIS — A419 Sepsis, unspecified organism: Secondary | ICD-10-CM | POA: Diagnosis not present

## 2022-11-30 DIAGNOSIS — G9341 Metabolic encephalopathy: Secondary | ICD-10-CM | POA: Diagnosis not present

## 2022-11-30 DIAGNOSIS — C9000 Multiple myeloma not having achieved remission: Secondary | ICD-10-CM | POA: Diagnosis not present

## 2022-11-30 LAB — GLUCOSE, CAPILLARY
Glucose-Capillary: 65 mg/dL — ABNORMAL LOW (ref 70–99)
Glucose-Capillary: 106 mg/dL — ABNORMAL HIGH (ref 70–99)
Glucose-Capillary: 74 mg/dL (ref 70–99)
Glucose-Capillary: 78 mg/dL (ref 70–99)
Glucose-Capillary: 76 mg/dL (ref 70–99)

## 2022-11-30 LAB — BASIC METABOLIC PANEL
Anion gap: 9 (ref 5–15)
BUN: 20 mg/dL (ref 8–23)
CO2: 17 mmol/L — ABNORMAL LOW (ref 22–32)
Calcium: 6.1 mg/dL — CL (ref 8.9–10.3)
Chloride: 121 mmol/L — ABNORMAL HIGH (ref 98–111)
Creatinine, Ser: 2.69 mg/dL — ABNORMAL HIGH (ref 0.44–1.00)
GFR, Estimated: 19 mL/min — ABNORMAL LOW (ref >60)
Glucose, Bld: 86 mg/dL (ref 70–99)
Potassium: 3.3 mmol/L — ABNORMAL LOW (ref 3.5–5.1)
Sodium: 147 mmol/L — ABNORMAL HIGH (ref 135–145)

## 2022-11-30 LAB — CULTURE, BLOOD (ROUTINE X 2)
Culture: NO GROWTH
Culture: NO GROWTH
Special Requests: ADEQUATE
Special Requests: ADEQUATE

## 2022-11-30 LAB — CBC
HCT: 28.8 % — ABNORMAL LOW (ref 36.0–46.0)
Hemoglobin: 9.1 g/dL — ABNORMAL LOW (ref 12.0–15.0)
MCH: 28.5 pg (ref 26.0–34.0)
MCHC: 31.6 g/dL (ref 30.0–36.0)
MCV: 90.3 fL (ref 80.0–100.0)
Platelets: 200 10*3/uL (ref 150–400)
RBC: 3.19 MIL/uL — ABNORMAL LOW (ref 3.87–5.11)
RDW: 24.6 % — ABNORMAL HIGH (ref 11.5–15.5)
WBC: 4.1 10*3/uL (ref 4.0–10.5)
nRBC: 0.5 % — ABNORMAL HIGH (ref 0.0–0.2)

## 2022-11-30 MED ORDER — DEXTROSE 50 % IV SOLN
25.0000 mL | Freq: Once | INTRAVENOUS | Status: AC
  Administered 2022-11-30: 25 mL via INTRAVENOUS
  Filled 2022-11-30: qty 50

## 2022-11-30 MED ORDER — LORAZEPAM 2 MG/ML IJ SOLN
1.0000 mg | INTRAMUSCULAR | Status: DC | PRN
  Administered 2022-11-30 – 2022-12-05 (×2): 1 mg via INTRAVENOUS
  Filled 2022-11-30: qty 1

## 2022-11-30 MED ORDER — HYDROMORPHONE HCL 1 MG/ML IJ SOLN: 1.0000 mg | INTRAMUSCULAR | Status: DC | PRN

## 2022-11-30 MED ORDER — HYDROMORPHONE BOLUS VIA INFUSION
1.0000 mg | INTRAVENOUS | Status: DC | PRN
  Administered 2022-12-01: 1.5 mg via INTRAVENOUS
  Administered 2022-12-02 (×4): 1 mg via INTRAVENOUS

## 2022-11-30 MED ORDER — CHLORHEXIDINE GLUCONATE CLOTH 2 % EX PADS
6.0000 | MEDICATED_PAD | Freq: Every day | CUTANEOUS | Status: DC
  Administered 2022-12-01 – 2022-12-04 (×4): 6 via TOPICAL

## 2022-11-30 MED ORDER — SODIUM CHLORIDE 0.9% FLUSH: 10.0000 mL | INTRAVENOUS | Status: DC | PRN

## 2022-11-30 NOTE — Progress Notes (Signed)
Daily Progress Note   Patient Name: Michelle [name]       Date: 11/30/2022 DOB: [birth year]  Age: 62 y.o. MRN#: [id number] Attending Physician: Edwin Dada, * Primary Care Physician: Leeroy Cha, MD Admit Date: 11/07/2022  Reason for Consultation/Follow-up: Establishing goals of care  Subjective: Centrally unresponsive Laying sideways in bed On hydromorphone continuous opioid infusion at 1 mg/h Ongoing symptom burden-approaching end-of-life No family at bedside.   Length of Stay: 5  Current Medications: Scheduled Meds:   Chlorhexidine Gluconate Cloth  6 each Topical Q0600   levothyroxine  100 mcg Oral Q0600   metoprolol tartrate  2.5 mg Intravenous Q6H   sodium bicarbonate  650 mg Oral Daily    Continuous Infusions:  dextrose 5 % and 0.45% NaCl 75 mL/hr at 11/30/22 0900   HYDROmorphone 1 mg/hr (11/30/22 1017)    PRN Meds: acetaminophen **OR** acetaminophen, glycopyrrolate, HYDROmorphone (DILAUDID) injection, LORazepam, morphine CONCENTRATE, mouth rinse  Physical Exam         Weak appearing lady  Resting in bed Does not open eyes or interact Does not verbalize No distress No edema  Vital Signs: BP 107/67   Pulse (!) 108   Temp 98 F (36.7 C) (Axillary)   Resp (!) 9   Ht '5\' 4"'$  (1.626 m)   Wt 73.8 kg   SpO2 100%   BMI 27.93 kg/m  SpO2: SpO2: 100 % O2 Device: O2 Device: Room Air O2 Flow Rate:    Intake/output summary:  Intake/Output Summary (Last 24 hours) at 11/30/2022 1134 Last data filed at 11/30/2022 0900 Gross per 24 hour  Intake 1949.17 ml  Output 875 ml  Net 1074.17 ml    LBM: Last BM Date : 11/26/22 Baseline Weight: Weight: 71.6 kg Most recent weight: Weight: 73.8 kg       Palliative Assessment/Data:      Patient Active  Problem List   Diagnosis Date Noted   Hypocalcemia 11/27/2022   Thrombocytopenia (HCC) 11/27/2022   Hypophosphatemia 11/27/2022   Hypernatremia 11/27/2022   Malnutrition of moderate degree 83/29/1916   Acute metabolic encephalopathy 64/60/0459   Acute renal failure superimposed on stage 3b chronic kidney disease ([place]) 11/26/2022   Fall at home, initial encounter 11/26/2022   Left humeral fracture 11/26/2022   Failure to thrive in adult 11/26/2022   Possible sepsis  with end organ damage 11/21/2022   UTI (urinary tract infection) 11/14/2022   Palliative care by specialist 10/21/2022   Goals of care, counseling/discussion 10/21/2022   Malignant pleural effusion 10/01/2022   Stage 3a chronic kidney disease (CKD) ([place]) 10/01/2022   GERD (gastroesophageal reflux disease) 10/01/2022   Anxiety 10/01/2022   Anemia of chronic renal failure, stage 3b ([place]) 08/02/2022   Pleural effusion 06/25/2022   Multiple myeloma without remission ([place]) 06/20/2022   Intractable pain 06/14/2022   Hypercalcemia 06/14/2022   AKI (acute kidney injury) ([place]) 64/71/0626   Metabolic acidosis 94/85/4627   Tachycardia 06/14/2022   Metastasis to bone ([place]) 06/10/2022   Refractory anemia ([place]) 05/17/2022   Hyperlipidemia 02/16/2017   Morbid obesity ([place]) 02/16/2017   Essential hypertension 02/16/2017   Hypothyroidism 02/16/2017    Palliative Care Assessment & Plan   Patient Profile:    Assessment:  Michelle [name] is a 62 y.o. 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      1/22: Admitted on antibiotics for ?sepsis, AKI 1/23: Oncology and Palliative care consulted, still densely  encephalopathic 1/24: Mentation no change, Cr slightly better  1/25: No change, CT shows new large plasmacytomas in right lung   Recommendations/Plan: Patient is being followed by medical oncology, hospital medicine as well as palliative care, overall, appears to continue to decline, has likely entered into the dying process.  Remains comfortable currently on elevated doses of hydromorphone infusion.  Has other comfort orders such as Ativan and Robinul also available.  Anticipate hospital death.     Code Status:    Code Status Orders  (From admission, onward)           Start     Ordered   11/28/2022 2310  Full code  Continuous       Question:  By:  Answer:  Consent: discussion documented in EHR   11/15/2022 2311           Code Status History     Date Active Date Inactive Code Status Order ID Comments User Context   10/17/2022 2109 10/31/2022 2132 Full Code [id number]  Kayleen Memos, DO ED   10/01/2022 1130 10/03/2022 2139 Full Code [id number]  Jonnie Finner, DO Inpatient   06/14/2022 2137 06/26/2022 2108 Full Code [id number]  Orene Desanctis, DO Inpatient      Advance Directive Documentation    Flowsheet Row Most Recent Value  Type of Advance Directive Living will  Pre-existing out of facility DNR order (yellow form or pink MOST form) --  "MOST" Form in Place? --       Prognosis: Hours to days  Discharge Planning: Anticipate hospital death.   Care plan was discussed with  IDT  Thank you for allowing the Palliative Medicine Team to assist in the care of this patient.  Mod MDM     Greater than 50%  of this time was spent counseling and coordinating care related to the above assessment and plan.  Loistine Chance, MD  Please contact Palliative Medicine Team phone at [phone] for questions and concerns.

## 2022-11-30 NOTE — Progress Notes (Signed)
  Progress Note   Patient: Michelle [name] YTK:[id number] DOB: [birth year] DOA: 11/07/2022     5 DOS: the patient was seen and examined on 11/30/2022        Brief hospital course: Mrs. Dimperio is a 62 y.o. 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    1/22: Admitted on antibiotics for ?sepsis, AKI 1/23: Oncology and Palliative care consulted, still densely encephalopathic 1/24: Mentation no change, Cr slightly better 1/25: No change, CT shows new large plasmacytomas in right lung 1/26: Dilaudid infusion started 1/27: Fluids stopped, analgesics increased     Assessment and Plan: * Multiple myeloma with leukemic transformation Acute metabolic encephalopathy due to leukemia Malignant pleural effusion Sepsis ruled out Left humeral fracture Acute renal failure superimposed on stage 3b chronic kidney disease (HCC) Malnutrition of moderate degree Hypernatremia Hypophosphatemia Thrombocytopenia (HCC) Hypocalcemia Failure to thrive in adult Fall at home, initial encounter Anxiety Anemia of chronic renal failure, stage 3b (HCC) Tachycardia Metabolic acidosis Hypothyroidism Essential hypertension Hyperlipidemia  Patient was admitted and there was some initial concern for infection.  Treated with broad spectrum antibiotics but no improvement.  CT head, ammonia, VBG, CK all normal.  EEG with nonspecific encephalopathy only, no seizure activity.    Oncology were following and suspected that her cancer had progressed to plasma cell leukemia.  Her mentation, tachycardia and uncontrolled pain made any further chemotherapy prohibitive.  She was started on Dilaudid infusion.   During the day today her pain levels escalated, we have had to increase her Dilaudid infusion, and stop fluids and family understand we are expecting in hospital death or transition to residential hospice.         Subjective: Unresponsive,.  Level increased today, no fever or wheezing     Physical Exam: BP 107/67   Pulse (!) 107   Temp 98 F (36.7 C) (Axillary)   Resp 10   Ht '5\' 4"'$  (1.626 m)   Wt 73.8 kg   SpO2 100%   BMI 27.93 kg/m   Adult female, lying in bed, does not open eyes today, grimacing in pain Tachycardic, regular, no murmurs Respiratory rate seems increased, lung sounds diminished, no rales Abdomen without grimace to palpation Attention diminished, no spontaneous verbalizations, occasionally when present pain, moves the right arm     Data Reviewed: Basic metabolic panel and complete blood counts unchanged    Family Communication: 3 daughters at the bedside, as well as sister    Disposition: Status is: Inpatient The patient presented with acute metabolic encephalopathy and fall  There was some initial hope that this was infection, but sepsis has been ruled out, we suspect this is leukemic transformation of her multiple myeloma  She is now in terminal delirium, and worsening pain.         Author: Edwin Dada, MD 11/30/2022 1:09 PM  For on call review [email or web address].

## 2022-11-30 NOTE — IPAL (Signed)
  Interdisciplinary Goals of Care Family Meeting   Date carried out: 11/30/2022  Location of the meeting: Unit  Member's involved: Physician, Bedside Registered Nurse, and Family Member or next of kin (daughters [name] and Renville)  Durable Power of Forensic psychologist or acting medical decision maker: Carly    Discussion: We discussed goals of care for [employer] .  Given the progression of her cancer despite all aggressive therapies, and her deterioration in the last week refractory to fluids and antibiotics, which appears to be from escalation of her malignancy and without therapeutic options to slow the cancer, family wish to change CODE STATUS to DNR.  Other family will present later today, and we will discuss how best to provide comfort measures at that time.  Code status:   Code Status: DNR   Disposition: In-patient comfort care  Time spent for the meeting: 15 minutes    Edwin Dada, MD  11/30/2022, 12:39 PM

## 2022-11-30 NOTE — Progress Notes (Addendum)
Michelle [name] is now on a Dilaudid drip.  She appears to be much more comfortable.  She is barely arousable.  I still suspect that she probably has an element of encephalopathy.  She still has a renal insufficiency with a BUN of 20 creatinine 2.69.  Sodium is 147.  Potassium 3.3.  Calcium is quite low at 6.1 but she has a low albumin.  Her CBC shows white count of 4.1.  Hemoglobin 9.1.  Platelet count 200,000.  She is not eating.  She is on some IV fluid.  I know the family is still deciding as to what would the best interventions for her.  I really think that we need to focus on comfort care for her.  She has progressive myeloma that is likely trying to transform into a more aggressive plasma cell leukemia.  I know the staff in the ICU are doing a fantastic job trying to help keep her comfortable and try to keep her nice relaxed.  Her vital signs show a temperature of 97.9.  Pulse 110.  Blood pressure 124/79.  Her lungs show some wheezing.  She has bilateral crackles.  Cardiac exam tachycardic but regular.  Abdomen is soft.  Bowel sounds are somewhat decreased.  There is no guarding or rebound tenderness.  Extremities shows the left arm with the cast on.  Neurological exam shows no focal neurological deficits.  Michelle [name] has a very aggressive myeloma.  She has had chemotherapy.  She has progression by her radiographic studies.  She has the encephalopathy which I suspect is probably more so from her underlying malignancy then anything else.  She does have the Serratia urinary tract infection which really should be treated relatively successfully with the IV antibiotic.  Again, her family still is deciding as to how to best care for right now.  We will certainly respect their wishes.  I know this is a very difficult situation for them.  I know they have been with her and have been incredibly supportive of her in trying to get her better.  Again, no the staff in the ICU have been incredibly attentive and  have done a great job trying to get Ms. back better.  Her birthday is coming up in 2 days.  I am sure that we will celebrate this.  [name], MD  2 Timothy 4:6-8

## 2022-12-01 DIAGNOSIS — R627 Adult failure to thrive: Secondary | ICD-10-CM | POA: Diagnosis not present

## 2022-12-01 DIAGNOSIS — G9341 Metabolic encephalopathy: Secondary | ICD-10-CM | POA: Diagnosis not present

## 2022-12-01 DIAGNOSIS — N1832 Chronic kidney disease, stage 3b: Secondary | ICD-10-CM | POA: Diagnosis not present

## 2022-12-01 DIAGNOSIS — C9000 Multiple myeloma not having achieved remission: Secondary | ICD-10-CM | POA: Diagnosis not present

## 2022-12-01 LAB — T3: T3, Total: 58 ng/dL — ABNORMAL LOW (ref 71–180)

## 2022-12-01 MED ORDER — SCOPOLAMINE 1 MG/3DAYS TD PT72
1.0000 | MEDICATED_PATCH | TRANSDERMAL | Status: DC
  Administered 2022-12-01 – 2022-12-04 (×2): 1.5 mg via TRANSDERMAL
  Filled 2022-12-01 (×2): qty 1

## 2022-12-01 NOTE — Plan of Care (Signed)
  Problem: Pain Managment: Goal: General experience of comfort will improve Outcome: Progressing

## 2022-12-01 NOTE — Progress Notes (Signed)
  Progress Note   Patient: Michelle [name] ASN:[id number] DOB: 03-13-1961 DOA: 11/17/2022     6 DOS: the patient was seen and examined on 12/01/2022        Brief hospital course: [name] is a 62 y.o. 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    1/22: Admitted on antibiotics for ?sepsis, AKI 1/23: Oncology and Palliative care consulted, still densely encephalopathic 1/24: Mentation no change, Cr slightly better 1/25: No change, CT shows new large plasmacytomas in right lung 1/26: Dilaudid infusion started 1/27: Fluids stopped, analgesics increased     Assessment and Plan: * Multiple myeloma with leukemic transformation Acute metabolic encephalopathy due to leukemia Malignant pleural effusion Sepsis ruled out Left humeral fracture Acute renal failure superimposed on stage 3b chronic kidney disease (HCC) Malnutrition of moderate degree Hypernatremia Hypophosphatemia Thrombocytopenia (HCC) Hypocalcemia Failure to thrive in adult Fall at home, initial encounter Anxiety Anemia of chronic renal failure, stage 3b (HCC) Tachycardia Metabolic acidosis Hypothyroidism Essential hypertension Hyperlipidemia  Patient less responsive today, more obtunded. - Continue comfort care - Continue dilaudid infusion - Place foley         Subjective: Patient less responsive today, respirations starting to become a regular     Physical Exam: BP (!) 153/91 (BP Location: Right Arm) Comment: Per family request  Pulse (!) 122 Comment: Per family request  Temp 81 F (36.7 C) (Axillary)   Resp 12 Comment: Per family request  Ht '5\' 4"'$  (1.626 m)   Wt 73.8 kg   SpO2 100%  Comment: Per family request  BMI 27.93 kg/m   Elderly female, lying in bed, tachycardic, does not open eyes, does not respond to stimuli, breathing regular, mild edema diffusely, no spontaneous movements or verbalizations     Data Reviewed: No new labs    Family Communication: 2 daughters at the bedside    Disposition: Status is: Inpatient Patient presented with worsening multiple myeloma, no leukemic transformation  Expected in-hospital death        Author: Edwin Dada, MD 12/01/2022 1:15 PM  For on call review [email or web address].

## 2022-12-01 NOTE — Progress Notes (Signed)
Daily Progress Note   Patient Name: Michelle [name]       Date: 12/01/2022 DOB: 09/24/1961  Age: 62 y.o. MRN#: [id number] Attending Physician: Edwin Dada, * Primary Care Physician: Leeroy Cha, MD Admit Date: 11/24/2022  Reason for Consultation/Follow-up: Establishing goals of care  Subjective:  Unresponsive but comfortable Family at bedside.    Length of Stay: 6  Current Medications: Scheduled Meds:   Chlorhexidine Gluconate Cloth  6 each Topical Daily   scopolamine  1 patch Transdermal Q72H    Continuous Infusions:  HYDROmorphone 1 mg/hr (12/01/22 0611)    PRN Meds: glycopyrrolate, HYDROmorphone, LORazepam, mouth rinse, sodium chloride flush  Physical Exam         Weak appearing lady  Resting in bed Does not open eyes or interact Does not verbalize No distress No edema  Vital Signs: BP (!) 153/91 (BP Location: Right Arm) Comment: Per family request  Pulse (!) 122 Comment: Per family request  Temp 98 F (36.7 C) (Axillary)   Resp 12 Comment: Per family request  Ht '5\' 4"'$  (1.626 m)   Wt 73.8 kg   SpO2 100% Comment: Per family request  BMI 27.93 kg/m  SpO2: SpO2: 100 % (Per family request) O2 Device: O2 Device: Room Air O2 Flow Rate:    Intake/output summary:  Intake/Output Summary (Last 24 hours) at 12/01/2022 1232 Last data filed at 12/01/2022 1000 Gross per 24 hour  Intake 25.16 ml  Output 300 ml  Net -274.84 ml    LBM: Last BM Date : 11/26/22 Baseline Weight: Weight: 71.6 kg Most recent weight: Weight: 73.8 kg       Palliative Assessment/Data:      Patient Active Problem List   Diagnosis Date Noted   Altered mental status 11/30/2022   Hypocalcemia 11/27/2022   Thrombocytopenia ([place]) 11/27/2022   Hypophosphatemia 11/27/2022    Hypernatremia 11/27/2022   Malnutrition of moderate degree 93/79/0240   Acute metabolic encephalopathy 97/35/3299   Acute renal failure superimposed on stage 3b chronic kidney disease ([place]) 11/26/2022   Fall at home, initial encounter 11/26/2022   Left humeral fracture 11/26/2022   Failure to thrive in adult 11/26/2022   Possible sepsis with end organ damage 11/04/2022   UTI (urinary tract infection) 11/26/2022   Palliative care by specialist 10/21/2022   Goals of care,  counseling/discussion 10/21/2022   Malignant pleural effusion 10/01/2022   Stage 3a chronic kidney disease (CKD) ([place]) 10/01/2022   GERD (gastroesophageal reflux disease) 10/01/2022   Anxiety 10/01/2022   Anemia of chronic renal failure, stage 3b ([place]) 08/02/2022   Pleural effusion 06/25/2022   Multiple myeloma without remission ([place]) 06/20/2022   Intractable pain 06/14/2022   Hypercalcemia 06/14/2022   AKI (acute kidney injury) ([place]) 93/81/0175   Metabolic acidosis 08/28/8526   Tachycardia 06/14/2022   Metastasis to bone ([place]) 06/10/2022   Refractory anemia ([place]) 05/17/2022   Hyperlipidemia 02/16/2017   Morbid obesity ([place]) 02/16/2017   Essential hypertension 02/16/2017   Hypothyroidism 02/16/2017    Palliative Care Assessment & Plan   Patient Profile:    Assessment:  Mrs. Michelle [name] is a 62 y.o. 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      1/22: Admitted on antibiotics for ?sepsis, AKI 1/23: Oncology and Palliative care consulted, still densely encephalopathic 1/24: Mentation no change, Cr slightly better  1/25: No change, CT shows new large plasmacytomas in right lung  1-27: DNR comfort  care.  Recommendations/Plan: DNR DNI Comfort care Add scopolamine patch Continue current Dilaudid infusion Continue current comfort medications TOC consult for residential hospice versus anticipated hospital death.      Code Status: now DNR DNI.    Code Status Orders  (From admission, onward)           Start     Ordered   12/03/2022 2310  Full code  Continuous       Question:  By:  Answer:  Consent: discussion documented in EHR   12/03/2022 2311           Code Status History     Date Active Date Inactive Code Status Order ID Comments User Context   10/17/2022 2109 10/31/2022 2132 Full Code [id number]  Kayleen Memos, DO ED   10/01/2022 1130 10/03/2022 2139 Full Code [id number]  Jonnie Finner, DO Inpatient   06/14/2022 2137 06/26/2022 2108 Full Code [id number]  Orene Desanctis, DO Inpatient      Advance Directive Documentation    Flowsheet Row Most Recent Value  Type of Advance Directive Living will  Pre-existing out of facility DNR order (yellow form or pink MOST form) --  "MOST" Form in Place? --       Prognosis: Hours to days  Discharge Planning: Residential hospice versus Anticipate hospital death: TOC to assist, consult placed.   Care plan was discussed with  IDT  Thank you for allowing the Palliative Medicine Team to assist in the care of this patient.  Mod MDM     Greater than 50%  of this time was spent counseling and coordinating care related to the above assessment and plan.  Loistine Chance, MD  Please contact Palliative Medicine Team phone at [phone] for questions and concerns.

## 2022-12-01 NOTE — Progress Notes (Signed)
Provider wants TOC to hold off on removing patient. TOC will need to follow up with provider.

## 2022-12-02 DIAGNOSIS — C9000 Multiple myeloma not having achieved remission: Secondary | ICD-10-CM | POA: Diagnosis not present

## 2022-12-02 DIAGNOSIS — N1832 Chronic kidney disease, stage 3b: Secondary | ICD-10-CM | POA: Diagnosis not present

## 2022-12-02 DIAGNOSIS — R627 Adult failure to thrive: Secondary | ICD-10-CM | POA: Diagnosis not present

## 2022-12-02 DIAGNOSIS — Z515 Encounter for palliative care: Secondary | ICD-10-CM | POA: Diagnosis not present

## 2022-12-02 DIAGNOSIS — J910 Malignant pleural effusion: Secondary | ICD-10-CM | POA: Diagnosis not present

## 2022-12-02 DIAGNOSIS — G9341 Metabolic encephalopathy: Secondary | ICD-10-CM | POA: Diagnosis not present

## 2022-12-02 DIAGNOSIS — R4182 Altered mental status, unspecified: Secondary | ICD-10-CM | POA: Diagnosis not present

## 2022-12-02 MED ORDER — ATROPINE SULFATE 1 % OP SOLN
2.0000 [drp] | Freq: Four times a day (QID) | OPHTHALMIC | Status: DC
  Administered 2022-12-02 – 2022-12-06 (×17): 2 [drp] via SUBLINGUAL
  Filled 2022-12-02: qty 2

## 2022-12-02 MED ORDER — GLYCOPYRROLATE 0.2 MG/ML IJ SOLN
0.4000 mg | Freq: Once | INTRAMUSCULAR | Status: AC
  Administered 2022-12-02: 0.4 mg via INTRAVENOUS
  Filled 2022-12-02: qty 2

## 2022-12-02 MED ORDER — GLYCOPYRROLATE 0.2 MG/ML IJ SOLN
0.1000 mg | INTRAMUSCULAR | Status: DC
  Administered 2022-12-02 – 2022-12-06 (×25): 0.1 mg via INTRAVENOUS
  Filled 2022-12-02 (×25): qty 1

## 2022-12-02 NOTE — Plan of Care (Signed)
  Problem: Coping: Goal: Level of anxiety will decrease Outcome: Progressing   Problem: Pain Managment: Goal: General experience of comfort will improve Outcome: Progressing

## 2022-12-02 NOTE — Progress Notes (Signed)
Ms. Roepke is now on 15 W.  She is on the Dilaudid infusion.  She has her eyes open but really is not responsive to voice.  She does not look like she is in any pain.  I think 2 of her daughters are with her.  Today is her birthday.  I just hate that she is in the hospital for her birthday.  We will still celebrate her birthday.  She does sound congested.  She has a scopolamine patch on.  She does have glycopyrrolate as needed.  We might need to make this a scheduled dose.  She really is on no IV fluid.  I am just glad that she is comfortable.  It is hard to say how much longer she has.  I just want to make sure that she does not have any dyspnea.  Currently, she is doing well without any dyspnea.  Again she looks comfortable.  She is now a DO NOT RESUSCITATE.  This is clearly appropriate for her.  I would not think that she would have to go anywhere.  I think that she will pass in the hospital.  I think that it would be very stressful on her body if she had to be moved.  No that she will get incredibly compassionate care from everybody upon 3 W.  Again today is her birthday.  I know that my nurses will celebrated in the office.  My nurses have always liked her.  She has always been so considerate and complementary to my staff.  I really am not surprised by this.  She is such a good woman.  She has a strong faith.  I know that she is not afraid.  She knows where she is going.   [name],  MD  Lurena Joiner 11:28

## 2022-12-02 NOTE — Progress Notes (Signed)
Daily Progress Note   Patient Name: Michelle [name]       Date: 12/02/2022 DOB: July 09, 1961  Age: 62 y.o. MRN#: [id number] Attending Physician: Edwin Dada, * Primary Care Physician: Leeroy Cha, MD Admit Date: 12/04/2022  Reason for Consultation/Follow-up: Establishing goals of care  Subjective:  Unresponsive  Eyes open 2 daughters at bedside Patient with increased respiratory secretions/congestion/death rattle.  Cussed with TRH MD, 2 daughters holding vigil at bedside as well as bedside RN who was present in the room.   Length of Stay: 7  Current Medications: Scheduled Meds:   atropine  2 drop Sublingual QID   Chlorhexidine Gluconate Cloth  6 each Topical Daily   glycopyrrolate  0.1 mg Intravenous Q4H   scopolamine  1 patch Transdermal Q72H    Continuous Infusions:  HYDROmorphone 1 mg/hr (12/02/22 0900)    PRN Meds: HYDROmorphone, LORazepam, mouth rinse, sodium chloride flush  Physical Exam         Weak appearing lady  Resting in bed Does not open eyes or interact Does not verbalize Mild distress some edema Eyes open Coarse breath sounds Terminal secretions/death rattle evident  Vital Signs: BP (!) 153/91 (BP Location: Right Arm) Comment: Per family request  Pulse (!) 122 Comment: Per family request  Temp 24 F (36.7 C) (Axillary)   Resp 12 Comment: Per family request  Ht '5\' 4"'$  (1.626 m)   Wt 73.8 kg   SpO2 100% Comment: Per family request  BMI 27.93 kg/m  SpO2: SpO2: 100 % (Per family request) O2 Device: O2 Device: Room Air O2 Flow Rate:    Intake/output summary:  Intake/Output Summary (Last 24 hours) at 12/02/2022 1008 Last data filed at 12/02/2022 4163 Gross per 24 hour  Intake 21.48 ml  Output 1350 ml  Net -1328.52 ml    LBM: Last  BM Date : 11/26/22 Baseline Weight: Weight: 71.6 kg Most recent weight: Weight: 73.8 kg       Palliative Assessment/Data:      Patient Active Problem List   Diagnosis Date Noted   Altered mental status 11/30/2022   Hypocalcemia 11/27/2022   Thrombocytopenia (HCC) 11/27/2022   Hypophosphatemia 11/27/2022   Hypernatremia 11/27/2022   Malnutrition of moderate degree 84/53/6468   Acute metabolic encephalopathy 01/23/2247   Acute renal failure superimposed on stage 3b chronic kidney  disease ([place]) 11/26/2022   Fall at home, initial encounter 11/26/2022   Left humeral fracture 11/26/2022   Failure to thrive in adult 11/26/2022   Possible sepsis with end organ damage 11/09/2022   UTI (urinary tract infection) 11/13/2022   Palliative care by specialist 10/21/2022   Goals of care, counseling/discussion 10/21/2022   Malignant pleural effusion 10/01/2022   Stage 3a chronic kidney disease (CKD) ([place]) 10/01/2022   GERD (gastroesophageal reflux disease) 10/01/2022   Anxiety 10/01/2022   Anemia of chronic renal failure, stage 3b ([place]) 08/02/2022   Pleural effusion 06/25/2022   Multiple myeloma without remission ([place]) 06/20/2022   Intractable pain 06/14/2022   Hypercalcemia 06/14/2022   AKI (acute kidney injury) ([place]) 40/34/7425   Metabolic acidosis 95/63/8756   Tachycardia 06/14/2022   Metastasis to bone ([place]) 06/10/2022   Refractory anemia ([place]) 05/17/2022   Hyperlipidemia 02/16/2017   Morbid obesity ([place]) 02/16/2017   Essential hypertension 02/16/2017   Hypothyroidism 02/16/2017    Palliative Care Assessment & Plan   Patient Profile:    Assessment:  Michelle [name] is a 62 y.o. 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      1/22: Admitted on antibiotics for ?sepsis, AKI 1/23: Oncology and Palliative care consulted, still densely encephalopathic 1/24: Mentation no change, Cr slightly better  1/25: No change, CT shows new large plasmacytomas in right lung  1-27: DNR comfort care.  Recommendations/Plan: DNR DNI Comfort care scopolamine patch, continue Robinul, add atropine drops sublingual for secretions-repositioning, avoid suctioning. Continue current Dilaudid infusion Continue current comfort medications anticipated hospital death.  Currently provided with a copy of gone from my sight.  Discussed with spiritual care colleagues, they will also follow-up with the family later today and offer additional support.     Code Status: now DNR DNI.    Code Status Orders  (From admission, onward)           Start     Ordered   12/01/2022 2310  Full code  Continuous       Question:  By:  Answer:  Consent: discussion documented in EHR   12/02/2022 2311           Code Status History     Date Active Date Inactive Code Status Order ID Comments User Context   10/17/2022 2109 10/31/2022 2132 Full Code [id number]  Kayleen Memos, DO ED   10/01/2022 1130 10/03/2022 2139 Full Code [id number]  Jonnie Finner, DO Inpatient   06/14/2022 2137 06/26/2022 2108 Full Code [id number]  Orene Desanctis, DO Inpatient      Advance Directive Documentation    Flowsheet Row Most Recent Value  Type of Advance Directive Living will  Pre-existing out of facility DNR order (yellow form or pink MOST form) --  "MOST" Form in Place? --       Prognosis: Hours to days  Discharge Planning: Anticipate hospital death  Care plan was discussed with 2 daughters, RN, spiritual care, TRH MD.  Thank you for allowing the Palliative Medicine Team to assist in the care of this patient.  Mod MDM     Greater than 50%  of this time was spent counseling and coordinating care  related to the above assessment and plan.  Loistine Chance,  MD  Please contact Palliative Medicine Team phone at [phone] for questions and concerns.

## 2022-12-02 NOTE — Plan of Care (Signed)
  Problem: Education: Goal: Knowledge of General Education information will improve Description: Including pain rating scale, medication(s)/side effects and non-pharmacologic comfort measures Outcome: Progressing   Problem: Pain Managment: Goal: General experience of comfort will improve Outcome: Progressing   Problem: Coping: Goal: Ability to identify and develop effective coping behavior will improve Outcome: Progressing   Problem: Clinical Measurements: Goal: Ability to maintain clinical measurements within normal limits will improve Outcome: Not Progressing   Problem: Nutrition: Goal: Adequate nutrition will be maintained Outcome: Not Progressing   Problem: Elimination: Goal: Will not experience complications related to bowel motility Outcome: Not Progressing   Patient progressing as expected toward end-of-life. Patient on comfort care measures. Supports provided to patient and family during this time-limited period.  Ivan Anchors, RN 12/02/22 8:17 AM

## 2022-12-02 NOTE — Progress Notes (Signed)
Nutrition Brief Note  Chart reviewed. Pt now transitioning to comfort care.  No further nutrition interventions planned at this time.  Please re-consult as needed.     Bryndon Cumbie RD, LDN For contact information, refer to [facility].   

## 2022-12-02 NOTE — TOC Transition Note (Signed)
Transition of Care [facility]) - CM/SW Discharge Note  Patient Details  Name: Michelle [name] MRN: [id number] Date of Birth: 1961-05-11  [facility]) CM/SW Contact:  Sherie Don, LCSW Phone Number: 12/02/2022, 9:45 AM  Clinical Narrative: TOC consulted for residential hospice, but due to patient's current condition the patient will be comfort care in the hospital. TOC signing off at this time.  Final next level of care:  (Comfort care in the hospital) Barriers to Discharge: Barriers Resolved  Patient Goals and CMS Choice Choice offered to / list presented to : NA  Discharge Plan and Services Additional resources added to the After Visit Summary for          DME Arranged: N/A DME Agency: NA  Social Determinants of Health (SDOH) Interventions [place]: No Food Insecurity (11/27/2022)  Housing: Low Risk  (10/18/2022)  Transportation Needs: No Transportation Needs (10/18/2022)  Utilities: Not At Risk (10/18/2022)  Tobacco Use: Medium Risk (11/28/2022)   Readmission Risk Interventions    11/28/2022    1:34 PM 10/23/2022    1:47 PM 10/03/2022    8:34 AM  Readmission Risk Prevention Plan  Transportation Screening Complete Complete Complete  PCP or Specialist Appt within 3-5 Days   Complete  HRI or [place]   Complete  Social Work Consult for [place] Planning/Counseling   Complete  Palliative Care Screening   Not Applicable  Medication Review Press photographer) Complete Complete Complete  PCP or Specialist appointment within 3-5 days of discharge Complete Complete   HRI or [place] Complete Complete   SW Recovery Care/Counseling Consult Complete Complete   Palliative Care Screening Not Applicable Not [place] Not Applicable Not Applicable

## 2022-12-02 NOTE — Progress Notes (Signed)
  Progress Note   Patient: Michelle [name] [id number] DOB: 09-08-1961 DOA: 12/01/2022     7 DOS: the patient was seen and examined on 12/02/2022 at 8:33AM      Brief hospital course: Michelle [name] is a 61 y.o. F with MM who presented with encephalopathy and respiratory distress.  See prior summary, we have transitioned to comfort measures and expect an inhospital death.     Assessment and Plan: * Multiple myeloma with leukemic transformation Acute metabolic encephalopathy due to leukemia Malignant pleural effusion Sepsis ruled out Left humeral fracture Acute renal failure superimposed on stage 3b chronic kidney disease (HCC) Malnutrition of moderate degree Hypernatremia Hypophosphatemia Thrombocytopenia (HCC) Hypocalcemia Failure to thrive in adult Fall at home, initial encounter Anxiety Anemia of chronic renal failure, stage 3b (HCC) Tachycardia Metabolic acidosis Hypothyroidism Essential hypertension Hyperlipidemia  More rattling in airway, Palliative and I are working to alleviate this. - COntinue dilaudid infusion - Pushes IV dilaudid for pain - Continue foley - Continue scop patch - Repeat RObinul and trial atropine after if needed         Subjective: Unresponsive, does not open eyes, or respiratory secretions, daughter at the bedside     Physical Exam: BP (!) 153/91 (BP Location: Right Arm) Comment: Per family request  Pulse (!) 122 Comment: Per family request  Temp 43 F (36.7 C) (Axillary)   Resp 12 Comment: Per family request  Ht '5\' 4"'$  (1.626 m)   Wt 73.8 kg   SpO2 100% Comment: Per family request  BMI 27.93 kg/m   Elderly female, lying in bed, tachycardic, does not open eyes, respirations are ragged but unlabored, no grimace or apparent discomfort.  Rattling in the airway as above.     Data Reviewed: No new labs    Family Communication: 2 daughters at the bedside    Disposition: Status is: Inpatient Patient presented with  worsening multiple myeloma, now leukemic transformation  Expected in-hospital death        Author: Edwin Dada, MD 12/02/2022 11:04 AM  For on call review [email or web address].

## 2022-12-02 NOTE — Progress Notes (Signed)
Chaplain met with Ileana's daughters, Nila Nephew and [name] to provide support.  They request resources for support.  Chaplain referred them to hospice for grief counseling and let them know how to look up and find an additional therapist as well.  They both went to [facility] after their father's death when they were in middle school.  Chaplain also provided some grief education and shared that one loss will often bring up past losses. They do have family support including from their grandmother, sister, aunts, uncles, cousins and church family.  Chaplain provided some additional education on the dying process and what they were seeing with their mother's breathing.  Carmin appeared comfortable and was not agitated or distressed.  Chaplain assisted them with speaking with the nurse about their mother's desire to be an organ donor if eligible. Chaplains will continue to follow as we are able, but please also page as needs arise or if family requests.  [address], [place] Pager, [phone]

## 2022-12-03 DIAGNOSIS — Z66 Do not resuscitate: Secondary | ICD-10-CM

## 2022-12-03 DIAGNOSIS — Z79899 Other long term (current) drug therapy: Secondary | ICD-10-CM

## 2022-12-03 DIAGNOSIS — J910 Malignant pleural effusion: Secondary | ICD-10-CM | POA: Diagnosis not present

## 2022-12-03 DIAGNOSIS — N179 Acute kidney failure, unspecified: Secondary | ICD-10-CM

## 2022-12-03 DIAGNOSIS — Z515 Encounter for palliative care: Secondary | ICD-10-CM

## 2022-12-03 DIAGNOSIS — R4589 Other symptoms and signs involving emotional state: Secondary | ICD-10-CM

## 2022-12-03 DIAGNOSIS — R0600 Dyspnea, unspecified: Secondary | ICD-10-CM

## 2022-12-03 DIAGNOSIS — R0989 Other specified symptoms and signs involving the circulatory and respiratory systems: Secondary | ICD-10-CM

## 2022-12-03 DIAGNOSIS — R627 Adult failure to thrive: Secondary | ICD-10-CM | POA: Diagnosis not present

## 2022-12-03 DIAGNOSIS — C9000 Multiple myeloma not having achieved remission: Secondary | ICD-10-CM | POA: Diagnosis not present

## 2022-12-03 LAB — PROTEIN ELECTROPHORESIS, SERUM, WITH REFLEX
A/G Ratio: 1.1 (ref 0.7–1.7)
Albumin ELP: 3.3 g/dL (ref 2.9–4.4)
Alpha-1-Globulin: 0.3 g/dL (ref 0.0–0.4)
Alpha-2-Globulin: 0.9 g/dL (ref 0.4–1.0)
Beta Globulin: 0.8 g/dL (ref 0.7–1.3)
Gamma Globulin: 0.9 g/dL (ref 0.4–1.8)
Globulin, Total: 2.9 g/dL (ref 2.2–3.9)
M-Spike, %: 0.5 g/dL — ABNORMAL HIGH
SPEP Interpretation: 0
Total Protein ELP: 6.2 g/dL (ref 6.0–8.5)

## 2022-12-03 LAB — IMMUNOFIXATION REFLEX, SERUM
IgA: <5 mg/dL — ABNORMAL LOW (ref 87–352)
IgG (Immunoglobin G), Serum: 997 mg/dL (ref 586–1602)
IgM (Immunoglobulin M), Srm: 6 mg/dL — ABNORMAL LOW (ref 26–217)

## 2022-12-03 LAB — VITAMIN B1: Vitamin B1 (Thiamine): 283.3 nmol/L — ABNORMAL HIGH (ref 66.5–200.0)

## 2022-12-03 MED ORDER — LIP MEDEX EX OINT
TOPICAL_OINTMENT | CUTANEOUS | Status: DC | PRN
  Administered 2022-12-03: 75 via TOPICAL
  Filled 2022-12-03: qty 7

## 2022-12-03 MED ORDER — HYDROMORPHONE BOLUS VIA INFUSION
2.0000 mg | INTRAVENOUS | Status: DC | PRN
  Administered 2022-12-05 – 2022-12-06 (×5): 2 mg via INTRAVENOUS

## 2022-12-03 MED ORDER — GLYCOPYRROLATE 0.2 MG/ML IJ SOLN
0.4000 mg | Freq: Once | INTRAMUSCULAR | Status: AC
  Administered 2022-12-03: 0.4 mg via INTRAVENOUS
  Filled 2022-12-03: qty 2

## 2022-12-03 NOTE — Progress Notes (Signed)
  Progress Note   Patient: Michelle [name] [id number]:[id number] DOB: 1961-02-18 DOA: 11/10/2022     8 DOS: the patient was seen and examined on 12/03/2022 at 10:20AM      Brief hospital course: Michelle [name] is a 62 y.o. F with MM who presented with encephalopathy and respiratory distress.  See prior summary, we have transitioned to comfort measures and expect an inhospital death.     Assessment and Plan: * Multiple myeloma with leukemic transformation Acute metabolic encephalopathy due to leukemia Malignant pleural effusion Sepsis ruled out Left humeral fracture Acute renal failure superimposed on stage 3b chronic kidney disease (HCC) Malnutrition of moderate degree Hypernatremia Hypophosphatemia Thrombocytopenia (HCC) Hypocalcemia Failure to thrive in adult Fall at home, initial encounter Anxiety Anemia of chronic renal failure, stage 3b (HCC) Tachycardia Metabolic acidosis Hypothyroidism Essential hypertension Hyperlipidemia  Progressive rattle, Palliative and I are working to alleviate this. - Continue dilaudid infusion - Conitnue bolus IV dilaudid for pain or agitation - Continue foley - Continue scop patch - Continue scheduled Robinul and atropine         Subjective: Remains unresponsive, rattle noted, respirations more irregular.     Physical Exam: BP 123/74 (BP Location: Right Leg)   Pulse (!) 153   Temp 98 F (36.7 C) (Axillary)   Resp 15   Ht '5\' 4"'$  (1.626 m)   Wt 73.8 kg   SpO2 96%   BMI 27.93 kg/m   Elderly female, lying in bed, tachycardic, does not open eyes, respirations are ragged but unlabored, no grimace or apparent discomfort.  Rattling in the airway as above.     Data Reviewed: No new labs    Family Communication: Sister mother and friend in vigil at bedside    Disposition: Status is: Inpatient Patient presented with worsening multiple myeloma, now leukemic transformation  Expected in-hospital  death        Author: Edwin Dada, MD 12/03/2022 3:51 PM  For on call review [email or web address].

## 2022-12-03 NOTE — Progress Notes (Signed)
Daily Progress Note   Patient Name: Michelle [name]       Date: 12/03/2022 DOB: 1961-08-10  Age: 62 y.o. MRN#: [id number] Attending Physician: Edwin Dada, * Primary Care Physician: Leeroy Cha, MD Admit Date: 11/24/2022 Length of Stay: 8 days  Reason for Consultation/Follow-up: Terminal Care  Subjective:   CC: Patient unresponsive to voice/touch laying in bed. Daughter holding vigil at bedside. Following up regarding symptom management at the end of life.   Subjective:  Reviewed EMR prior to seeing patient.  Patient has continued to receive basal dosing 1 mg/h IV Dilaudid.  Within the past 24 hours patient has required IV Dilaudid 1 mg x 4 bolus doses.  Presented to bedside.  Patient seen laying in bed.  Patient unresponsive to voice or touch.  Did note patient's respiratory rate was increased to at least 26 with accessory muscle use noted.  Spoke with patient's daughter at bedside to answer questions and provide support as able.  Daughter denies concerns at this time and thankful for continued focus on patient's comfort at the end of life.  Discussed increasing IV Dilaudid continuous basal dosing based on patient's increased respiratory effort.  Daughter agreeing with plan.  All questions answered at that time.  Noted palliative medicine team would continue to follow along.   Discussed care with bedside RN after visit to update regarding plan.  Review of Systems  Objective:   Vital Signs:  BP 123/74 (BP Location: Right Leg)   Pulse (!) 153   Temp 98 F (36.7 C) (Axillary)   Resp 15   Ht '5\' 4"'$  (1.626 m)   Wt 73.8 kg   SpO2 96%   BMI 27.93 kg/m   Physical Exam: General: Chronically ill-appearing, unresponsive to voice or touch, laying in bed Eyes: No drainage noted HENT: Dry mucous membranes, terminal secretions heard  Cardiovascular: RRR, edema in LE b/l Respiratory: increased work of breathing noted with accessory muscle use present Abdomen: not  distended Skin: Warm to touch in all extremities Neuro: Unresponsive to voice or touch  Imaging:  I personally reviewed recent imaging.   Assessment & Plan:   Assessment: Patient is a 62 year old female with a past medical history of multiple myeloma complicated by plasmacytoma in the base of the skull status post palliative radiation, malignant pleural effusion status post talc pleurodesis, worsening renal disease, and pathologic left humerus fracture and left hip avulsion fracture who was admitted on 11/29/2022 for management of acute metabolic encephalopathy and falls.  Despite aggressive medical interventions, patient's medical status continued to decline.  Patient was transitioned to comfort focused care on 11/30/2022.  Palliative medicine team continuing to follow along.  Recommendations/Plan: # Complex medical decision making/goals of care:  - Patient unresponsive to voice or touch when seen.  Spoke with family at bedside. Patient was transition to comfort focused care on 11/30/2022.  Palliative medicine team continuing to follow along to provide symptom management and support.  -  Code Status: DNR Prognosis: Hours - Days  # Symptom management:   -Pain/Dyspnea, acute in the setting of end-of-life care with known MM progression  Patient with increased work of breathing with accessory muscle use present when seen today.                                -Increase IV Dilaudid continuous basal dose to 1.5 mg/h.  Increase IV Dilaudid bolus dosing to 2 mg every 30 minutes as  needed.  Continue to adjust based on patient's symptom burden.  If patient needing frequent dosing, may need to consider continuous infusion.                  -Anxiety/agitation, in the setting of end-of-life care                               -Continue IV Ativan 1 mg every 4 hours as needed. Continue to adjust based on patient's symptom burden.                                                 -Secretions, in the setting of  end-of-life care                              -Continue IV glycopyrrolate 0.1 mg every 4 hours scheduled.   -Continue atropine 2 drops sublingual 4 times daily scheduled.   -Continue scopolamine patch every 72 hours.  # Psychosocial Support:  -Family holding vigil at bedside.  # Discharge Planning: In hospital death anticipated.   Discussed with: bedside RN, family  Thank you for allowing the palliative care team to participate in the care Burnetta Sabin.  Chelsea Aus, DO Palliative Care Provider PMT # [phone]  If patient remains symptomatic despite maximum doses, please call PMT at [phone] between 0700 and 1900. Outside of these hours, please call attending, as PMT does not have night coverage.

## 2022-12-03 NOTE — Progress Notes (Signed)
Ms. Frary is still hanging in.  She is really not responsive.  She does not sound as congested.  She is not on any oxygen.  She is not tachypneic.  When I listen to her heart, it does sound a little erratic and irregular.  I am really not surprised by this.  I suspect that her breathing pattern will change shortly.  Again she seems comfortable.  Her family celebrated her birthday yesterday.  They were able to celebrate her birthday.  She is on the low-dose Dilaudid infusion.  This is really done a good job in keeping her comfortable.  It is hard to say how much longer we have.  I think once we start to see her pattern of breathing change, then I would suspect we might be looking at less than 24 hours.  The staff up on [address] are doing a great job with her.  The family is very impressed with the care that she is getting.   [name], MD  Lurena Joiner 1:37

## 2022-12-03 NOTE — Plan of Care (Signed)
  Problem: Role Relationship: Goal: Family's ability to cope with current situation will improve Outcome: Not Progressing Goal: Ability to verbalize concerns, feelings, and thoughts to partner or family member will improve Outcome: Not Progressing

## 2022-12-04 ENCOUNTER — Encounter: Payer: Self-pay | Admitting: *Deleted

## 2022-12-04 DIAGNOSIS — A419 Sepsis, unspecified organism: Secondary | ICD-10-CM | POA: Diagnosis not present

## 2022-12-04 DIAGNOSIS — G9341 Metabolic encephalopathy: Secondary | ICD-10-CM | POA: Diagnosis not present

## 2022-12-04 DIAGNOSIS — R6520 Severe sepsis without septic shock: Secondary | ICD-10-CM | POA: Diagnosis not present

## 2022-12-04 NOTE — Plan of Care (Signed)
  Problem: Activity: Goal: Risk for activity intolerance will decrease Outcome: Not Applicable   Problem: Pain Managment: Goal: General experience of comfort will improve Outcome: Progressing

## 2022-12-04 NOTE — Progress Notes (Signed)
Chaplain followed up with Michelle [name]'s family.  Her sister and younger brother were present along with 2 of her daughters and her oldest daughter's SO.  The family is spending time together with her and did not have needs at this time.  Spiritual Care will continue to follow, but please page as needs arise.  [address] [place] Pager, [phone]

## 2022-12-04 NOTE — Progress Notes (Signed)
  Daily Progress Note   Patient Name: Michelle [name]       Date: 12/04/2022 DOB: 1961/09/13  Age: 62 y.o. MRN#: [id number] Attending Physician: Shawna Clamp, MD Primary Care Physician: Leeroy Cha, MD Admit Date: 11/19/2022 Length of Stay: 9 days  PMT continuing to follow along with patient's care. Patient if comfort focused care only. Adjusted IV medication for comfort on 1/30. As per EMR, patient's symptoms more appropriately managed without need of prn medications for breakthrough management. In hospital death is anticipated. Will continue to follow.    Chelsea Aus, DO Palliative Care Provider PMT # [phone]

## 2022-12-04 NOTE — Plan of Care (Signed)
  Problem: Education: Goal: Knowledge of General Education information will improve Description: Including pain rating scale, medication(s)/side effects and non-pharmacologic comfort measures Outcome: Progressing   Problem: Pain Managment: Goal: General experience of comfort will improve Outcome: Progressing   Problem: Respiratory: Goal: Verbalizations of increased ease of respirations will increase Outcome: Progressing

## 2022-12-04 NOTE — Progress Notes (Signed)
Patient continues to be admitted. She is now terminal with death expected soon. Will discontinue navigation at this time.   Oncology Nurse Navigator Documentation     12/04/2022    2:30 PM  Oncology Nurse Navigator Flowsheets  Navigation Complete Date: 12/04/2022  Post Navigation: Continue to Follow Patient? No  Reason Not Navigating Patient: Airline pilot Encounter Type Appt/Treatment Plan Review  Patient Visit Type MedOnc  Treatment Phase Active Tx  Barriers/Navigation Needs No Barriers At This Time  Interventions None Required  Acuity Level 1-No Barriers  Support Groups/Services Friends and Family  Time Spent with Patient 15

## 2022-12-04 NOTE — Progress Notes (Signed)
PROGRESS NOTE    CHIYO FAY  YBO:[id number] DOB: 08/15/1961 DOA: 11/15/2022 PCP: Leeroy Cha, MD    Brief Narrative:  This 62 years old female with a history of multiple myeloma presented with encephalopathy and respiratory distress.  Patient has downward decline.  Palliative care consulted.  Care transitioned to comfort measures and expect an in hospital bed.  Assessment & Plan:   Principal Problem:   Possible sepsis with end organ damage Active Problems:   Acute metabolic encephalopathy   Multiple myeloma without remission (HCC)   Left humeral fracture   Acute renal failure superimposed on stage 3b chronic kidney disease (HCC)   Hyperlipidemia   Essential hypertension   Hypothyroidism   Metabolic acidosis   Tachycardia   Anemia of chronic renal failure, stage 3b (HCC)   Malignant pleural effusion   Anxiety   Dying care   Fall at home, initial encounter   Failure to thrive in adult   Hypocalcemia   Thrombocytopenia (HCC)   Hypophosphatemia   Hypernatremia   Malnutrition of moderate degree   Altered mental status   DNR (do not resuscitate)   Need for emotional support   Dyspnea   High risk medication use   Terminal respiratory secretions   End of life care   Palliative care encounter   Multiple myeloma with leukemic transformation Acute metabolic encephalopathy due to leukemia: Patient has poor prognosis,. Care transitioned to full comfort care. Continue Dilaudid infusion.  Continue bolus IV Dilaudid for pain control and agitation. Continue Foley catheter, Continue scopolamine patch Continue with scheduled Robinul and atropine.  DVT prophylaxis: None Code Status: DNR/ Comfort acre Family Communication: Family at bed side. Disposition Plan:   Anticipate hospital death.   Consultants:  Palliative care  Procedures: None  Antimicrobials: None  Subjective: Patient remains unresponsive, respiration more irregular. Care transitioned to full  comfort care.  Objective: Vitals:   11/30/22 1337 11/30/22 1400 11/30/22 1715 12/02/22 2100  BP:  (!) 140/95 (!) 153/91 123/74  Pulse: (!) 128 (!) 132 (!) 122 (!) 153  Resp: 14 (!) '9 12 15  '$ Temp: 98 F (36.7 C)     TempSrc: Axillary     SpO2: 100% 100% 100% 96%  Weight:      Height:        Intake/Output Summary (Last 24 hours) at 12/04/2022 1219 Last data filed at 12/04/2022 0503 Gross per 24 hour  Intake 11.91 ml  Output 350 ml  Net -338.09 ml   Filed Weights   11/28/22 0440 11/29/22 0511 11/30/22 0500  Weight: 73 kg 74.1 kg 73.8 kg    Examination:  General exam: Appears chronically ill looking, but comfortable. Respiratory system: Clear to auscultation. Respiratory effort normal. RR 13 Cardiovascular system: S1 & S2 heard, regular rate and rhythm, no murmur. Gastrointestinal system: Abdomen is soft. Non tender, no distended, BS+ Central nervous system: Non verbal . Not assessed,  Extremities: No cyanosis, no clubbing Skin: No rashes, lesions or ulcers Psychiatry:Not assessed.     Data Reviewed: I have personally reviewed following labs and imaging studies  CBC: Recent Labs  Lab 11/28/22 0529 11/29/22 0525 11/30/22 0415  WBC 6.5 4.6 4.1  NEUTROABS 3.8 2.6  --   HGB 9.7* 9.5* 9.1*  HCT 30.6* 29.5* 28.8*  MCV 90.3 90.2 90.3  PLT 161 159 277   Basic Metabolic Panel: Recent Labs  Lab 11/28/22 0529 11/29/22 0525 11/30/22 0415  NA 147* 147* 147*  K 3.9 3.4* 3.3*  CL 119* 122*  121*  CO2 17* 16* 17*  GLUCOSE 95 82 86  BUN 24* 22 20  CREATININE 2.92* 2.63* 2.69*  CALCIUM 6.5* 6.3* 6.1*   GFR: Estimated Creatinine Clearance: 20.8 mL/min (A) (by C-G formula based on SCr of 2.69 mg/dL (H)). Liver Function Tests: Recent Labs  Lab 11/28/22 0529 11/29/22 0525  AST 25 24  ALT 14 12  ALKPHOS 89 93  BILITOT 0.6 0.5  PROT 5.3* 5.5*  ALBUMIN 2.6* 2.8*   No results for input(s): "LIPASE", "AMYLASE" in the last 168 hours. No results for input(s):  "AMMONIA" in the last 168 hours. Coagulation Profile: No results for input(s): "INR", "PROTIME" in the last 168 hours. Cardiac Enzymes: No results for input(s): "CKTOTAL", "CKMB", "CKMBINDEX", "TROPONINI" in the last 168 hours. BNP (last 3 results) No results for input(s): "PROBNP" in the last 8760 hours. HbA1C: No results for input(s): "HGBA1C" in the last 72 hours. CBG: Recent Labs  Lab 11/30/22 0203 11/30/22 0250 11/30/22 0549 11/30/22 0758 11/30/22 1153  GLUCAP 65* 106* 74 78 76   Lipid Profile: No results for input(s): "CHOL", "HDL", "LDLCALC", "TRIG", "CHOLHDL", "LDLDIRECT" in the last 72 hours. Thyroid Function Tests: No results for input(s): "TSH", "T4TOTAL", "FREET4", "T3FREE", "THYROIDAB" in the last 72 hours. Anemia Panel: No results for input(s): "VITAMINB12", "FOLATE", "FERRITIN", "TIBC", "IRON", "RETICCTPCT" in the last 72 hours. Sepsis Labs: No results for input(s): "PROCALCITON", "LATICACIDVEN" in the last 168 hours.  Recent Results (from the past 240 hour(s))  Urine Culture     Status: Abnormal   Collection Time: 11/19/2022  6:41 PM   Specimen: In/Out Cath Urine  Result Value Ref Range Status   Specimen Description   Final    IN/OUT CATH URINE Performed at [facility] [place] [address], [place], [place] [address]    Special Requests   Final    NONE Performed at [facility], [place] [address] [place], [place] [address]    Culture >=100,000 COLONIES/mL SERRATIA MARCESCENS (A)  Final   Report Status 11/28/2022 FINAL  Final   Organism ID, Bacteria SERRATIA MARCESCENS (A)  Final      Susceptibility   Serratia marcescens - MIC*    CEFAZOLIN >=64 RESISTANT Resistant     CEFEPIME <=0.12 SENSITIVE Sensitive     CEFTRIAXONE <=0.25 SENSITIVE Sensitive     CIPROFLOXACIN <=0.25 SENSITIVE Sensitive     GENTAMICIN <=1 SENSITIVE Sensitive     NITROFURANTOIN 128 RESISTANT Resistant     TRIMETH/SULFA <=20 SENSITIVE Sensitive     *  >=100,000 COLONIES/mL SERRATIA MARCESCENS  Resp panel by RT-PCR (RSV, Flu A&B, Covid) Anterior Nasal Swab     Status: None   Collection Time: 11/10/2022  6:47 PM   Specimen: Anterior Nasal Swab  Result Value Ref Range Status   SARS Coronavirus 2 by RT PCR NEGATIVE NEGATIVE Final    Comment: (NOTE) SARS-CoV-2 target nucleic acids are NOT DETECTED.  The SARS-CoV-2 RNA is generally detectable in upper respiratory specimens during the acute phase of infection. The lowest concentration of SARS-CoV-2 viral copies this assay can detect is 138 copies/mL. A negative result does not preclude SARS-Cov-2 infection and should not be used as the sole basis for treatment or other patient management decisions. A negative result may occur with  improper specimen collection/handling, submission of specimen other than nasopharyngeal swab, presence of viral mutation(s) within the areas targeted by this assay, and inadequate number of viral copies(<138 copies/mL). A negative result must be combined with clinical observations, patient  history, and epidemiological information. The expected result is Negative.  Fact Sheet for Patients:  EntrepreneurPulse.com.au  Fact Sheet for Healthcare Providers:  IncredibleEmployment.be  This test is no t yet approved or cleared by the [place] FDA and  has been authorized for detection and/or diagnosis of SARS-CoV-2 by FDA under an Emergency Use Authorization (EUA). This EUA will remain  in effect (meaning this test can be used) for the duration of the COVID-19 declaration under Section 564(b)(1) of the Act, 21 U.S.C.section 360bbb-3(b)(1), unless the authorization is terminated  or revoked sooner.       Influenza A by PCR NEGATIVE NEGATIVE Final   Influenza B by PCR NEGATIVE NEGATIVE Final    Comment: (NOTE) The Xpert Xpress SARS-CoV-2/FLU/RSV plus assay is intended as an aid in the diagnosis of influenza from  Nasopharyngeal swab specimens and should not be used as a sole basis for treatment. Nasal washings and aspirates are unacceptable for Xpert Xpress SARS-CoV-2/FLU/RSV testing.  Fact Sheet for Patients: EntrepreneurPulse.com.au  Fact Sheet for Healthcare Providers: IncredibleEmployment.be  This test is not yet approved or cleared by the [place] FDA and has been authorized for detection and/or diagnosis of SARS-CoV-2 by FDA under an Emergency Use Authorization (EUA). This EUA will remain in effect (meaning this test can be used) for the duration of the COVID-19 declaration under Section 564(b)(1) of the Act, 21 U.S.C. section 360bbb-3(b)(1), unless the authorization is terminated or revoked.     Resp Syncytial Virus by PCR NEGATIVE NEGATIVE Final    Comment: (NOTE) Fact Sheet for Patients: EntrepreneurPulse.com.au  Fact Sheet for Healthcare Providers: IncredibleEmployment.be  This test is not yet approved or cleared by the [place] FDA and has been authorized for detection and/or diagnosis of SARS-CoV-2 by FDA under an Emergency Use Authorization (EUA). This EUA will remain in effect (meaning this test can be used) for the duration of the COVID-19 declaration under Section 564(b)(1) of the Act, 21 U.S.C. section 360bbb-3(b)(1), unless the authorization is terminated or revoked.  Performed at [facility], [place] [address] [place], [place] [address]   Blood Culture (routine x 2)     Status: None   Collection Time: 11/30/2022  7:46 PM   Specimen: BLOOD RIGHT HAND  Result Value Ref Range Status   Specimen Description   Final    BLOOD RIGHT HAND Performed at [facility], [place] [address], [place], [place] [address]    Special Requests   Final    BOTTLES DRAWN AEROBIC AND ANAEROBIC Blood Culture adequate volume Performed at [place] [facility] [address], [place], [place] [address]    Culture   Final    NO GROWTH 5 DAYS Performed at [facility], [place] [address] [place], [place] [address]    Report Status 11/30/2022 FINAL  Final  Blood Culture (routine x 2)     Status: None   Collection Time: 11/11/2022  7:54 PM   Specimen: BLOOD  Result Value Ref Range Status   Specimen Description   Final    BLOOD RIGHT ANTECUBITAL Performed at [facility] [address] [place], [place] [address]    Special Requests   Final    BOTTLES DRAWN AEROBIC AND ANAEROBIC Blood Culture adequate volume Performed at [facility] [place] [address], [place], [place] [address]    Culture   Final    NO GROWTH 5 DAYS Performed at [facility], [place] [address]  [place], [place] [address]    Report Status 11/30/2022 FINAL  Final  MRSA Next Gen by PCR, Nasal     Status: None   Collection Time: 11/26/22  8:01 PM   Specimen: Nasal Mucosa; Nasal Swab  Result Value Ref Range Status   MRSA by PCR Next Gen NOT DETECTED NOT DETECTED Final    Comment: (NOTE) The GeneXpert MRSA Assay (FDA approved for NASAL specimens only), is one component of a comprehensive MRSA colonization surveillance program. It is not intended to diagnose MRSA infection nor to guide or monitor treatment for MRSA infections. Test performance is not FDA approved in patients less than 67 years old. Performed at [facility], [place] [address] [place], [place] [address]     Radiology Studies: No results found.  Scheduled Meds:  atropine  2 drop Sublingual QID   Chlorhexidine Gluconate Cloth  6 each Topical Daily   glycopyrrolate  0.1 mg Intravenous Q4H   scopolamine  1 patch Transdermal Q72H   Continuous Infusions:  HYDROmorphone 1.5 mg/hr (12/04/22 1127)     LOS: 9 days    Time spent: 35 mins    Howard Patton, MD [facility]   If 7PM-7AM, please contact night-coverage

## 2022-12-05 ENCOUNTER — Ambulatory Visit: Payer: BC Managed Care – PPO | Admitting: Orthopedic Surgery

## 2022-12-05 DIAGNOSIS — C9000 Multiple myeloma not having achieved remission: Secondary | ICD-10-CM | POA: Diagnosis not present

## 2022-12-05 NOTE — Progress Notes (Signed)
Chaplain engaged in an initial visit with Anyae's family, honoring referral from [employer].  Chaplain provided space for narrative life review.  25 oldest daughter shared that her mom is kindhearted with a good spirit, and that she valued showing up and being there for others.  Daughter expressed that Ellington's love of people came from her nana.  Jaleya's sister shared that they had a lot of fun growing up, and that their house was the neighborhood spot for the other kids.  Daughter also stated that her mom had talked to her about death since she was a child due to them having a lot of elderly family members.  Daughter voiced that mom had given them a healthy view of death and the ways to cope and keep going.  Chaplain assessed that while they are still processing everything happening to [place], they have also processed the ways Shannelle desires for them to keep living and going on.    Chaplain offered reflective listening, a compassionate presence, and offered support as needed.  Family has no needs at this time.   Chaplain Yeni Jiggetts, MDiv   12/05/22 1300  Spiritual Encounters  Type of Visit Initial  Care provided to: Pt and family  Referral source Chaplain team  Reason for visit End-of-life  Interventions  Spiritual Care Interventions Made Narrative/life review;Compassionate presence;Established relationship of care and support  Intervention Outcomes  Outcomes Awareness around self/spiritual resourses;Connection to spiritual care

## 2022-12-05 NOTE — Progress Notes (Signed)
PROGRESS NOTE    Michelle [name]  UDJ:[id number] DOB: 02-01-1961 DOA: 12/02/2022 PCP: Leeroy Cha, MD    Brief Narrative:  This 62 years old female with a history of multiple myeloma presented with encephalopathy and respiratory distress.  Patient has downward decline.  Palliative care consulted.  Care transitioned to comfort measures and expect an in hospital bed.  Assessment & Plan:   Principal Problem:   Possible sepsis with end organ damage Active Problems:   Acute metabolic encephalopathy   Multiple myeloma without remission (HCC)   Left humeral fracture   Acute renal failure superimposed on stage 3b chronic kidney disease (HCC)   Hyperlipidemia   Essential hypertension   Hypothyroidism   Metabolic acidosis   Tachycardia   Anemia of chronic renal failure, stage 3b (HCC)   Malignant pleural effusion   Anxiety   Dying care   Fall at home, initial encounter   Failure to thrive in adult   Hypocalcemia   Thrombocytopenia (HCC)   Hypophosphatemia   Hypernatremia   Malnutrition of moderate degree   Altered mental status   DNR (do not resuscitate)   Need for emotional support   Dyspnea   High risk medication use   Terminal respiratory secretions   End of life care   Palliative care encounter   Multiple myeloma with leukemic transformation Acute metabolic encephalopathy due to leukemia: Patient has poor prognosis,. Care transitioned to full comfort care. Continue Dilaudid infusion.  Continue bolus IV Dilaudid for pain control and agitation. Continue Foley catheter, Continue scopolamine patch Continue with scheduled Robinul and atropine.  DVT prophylaxis: None Code Status: DNR/ Comfort care. Family Communication: Family at bed side. Disposition Plan:   Anticipate hospital death.   Consultants:  Palliative care  Procedures: None  Antimicrobials: None  Subjective: Patient remains unresponsive, respiration more irregular but remains  comfortable. Care transitioned to full comfort care.  Objective: Vitals:   11/30/22 1337 11/30/22 1400 11/30/22 1715 12/02/22 2100  BP:  (!) 140/95 (!) 153/91 123/74  Pulse: (!) 128 (!) 132 (!) 122 (!) 153  Resp: 14 (!) '9 12 15  '$ Temp: 98 F (36.7 C)     TempSrc: Axillary     SpO2: 100% 100% 100% 96%  Weight:      Height:        Intake/Output Summary (Last 24 hours) at 12/05/2022 1257 Last data filed at 12/05/2022 1223 Gross per 24 hour  Intake 37.79 ml  Output --  Net 37.79 ml   Filed Weights   11/28/22 0440 11/29/22 0511 11/30/22 0500  Weight: 73 kg 74.1 kg 73.8 kg    Examination:  General exam: Appears chronically ill looking, but comfortable. Respiratory system: Clear to auscultation. Respiratory effort normal. RR 13 Cardiovascular system: S1 & S2 heard, regular rate and rhythm, no murmur. Gastrointestinal system: Abdomen is soft. Non tender, no distended, BS+ Central nervous system: Non verbal . Not assessed,  Extremities: No cyanosis, no clubbing Skin: No rashes, lesions or ulcers Psychiatry:Not assessed.     Data Reviewed: I have personally reviewed following labs and imaging studies  CBC: Recent Labs  Lab 11/29/22 0525 11/30/22 0415  WBC 4.6 4.1  NEUTROABS 2.6  --   HGB 9.5* 9.1*  HCT 29.5* 28.8*  MCV 90.2 90.3  PLT 159 588   Basic Metabolic Panel: Recent Labs  Lab 11/29/22 0525 11/30/22 0415  NA 147* 147*  K 3.4* 3.3*  CL 122* 121*  CO2 16* 17*  GLUCOSE 82 86  BUN  22 20  CREATININE 2.63* 2.69*  CALCIUM 6.3* 6.1*   GFR: Estimated Creatinine Clearance: 20.8 mL/min (A) (by C-G formula based on SCr of 2.69 mg/dL (H)). Liver Function Tests: Recent Labs  Lab 11/29/22 0525  AST 24  ALT 12  ALKPHOS 93  BILITOT 0.5  PROT 5.5*  ALBUMIN 2.8*   No results for input(s): "LIPASE", "AMYLASE" in the last 168 hours. No results for input(s): "AMMONIA" in the last 168 hours. Coagulation Profile: No results for input(s): "INR", "PROTIME" in the  last 168 hours. Cardiac Enzymes: No results for input(s): "CKTOTAL", "CKMB", "CKMBINDEX", "TROPONINI" in the last 168 hours. BNP (last 3 results) No results for input(s): "PROBNP" in the last 8760 hours. HbA1C: No results for input(s): "HGBA1C" in the last 72 hours. CBG: Recent Labs  Lab 11/30/22 0203 11/30/22 0250 11/30/22 0549 11/30/22 0758 11/30/22 1153  GLUCAP 65* 106* 74 78 76   Lipid Profile: No results for input(s): "CHOL", "HDL", "LDLCALC", "TRIG", "CHOLHDL", "LDLDIRECT" in the last 72 hours. Thyroid Function Tests: No results for input(s): "TSH", "T4TOTAL", "FREET4", "T3FREE", "THYROIDAB" in the last 72 hours. Anemia Panel: No results for input(s): "VITAMINB12", "FOLATE", "FERRITIN", "TIBC", "IRON", "RETICCTPCT" in the last 72 hours. Sepsis Labs: No results for input(s): "PROCALCITON", "LATICACIDVEN" in the last 168 hours.  Recent Results (from the past 240 hour(s))  Urine Culture     Status: Abnormal   Collection Time: 12/02/2022  6:41 PM   Specimen: In/Out Cath Urine  Result Value Ref Range Status   Specimen Description   Final    IN/OUT CATH URINE Performed at [facility] [address], [place], [place] [address]    Special Requests   Final    NONE Performed at [facility], [place] [address] [place], [place] [address]    Culture >=100,000 COLONIES/mL SERRATIA MARCESCENS (A)  Final   Report Status 11/28/2022 FINAL  Final   Organism ID, Bacteria SERRATIA MARCESCENS (A)  Final      Susceptibility   Serratia marcescens - MIC*    CEFAZOLIN >=64 RESISTANT Resistant     CEFEPIME <=0.12 SENSITIVE Sensitive     CEFTRIAXONE <=0.25 SENSITIVE Sensitive     CIPROFLOXACIN <=0.25 SENSITIVE Sensitive     GENTAMICIN <=1 SENSITIVE Sensitive     NITROFURANTOIN 128 RESISTANT Resistant     TRIMETH/SULFA <=20 SENSITIVE Sensitive     * >=100,000 COLONIES/mL SERRATIA MARCESCENS  Resp panel by RT-PCR (RSV, Flu A&B, Covid) Anterior Nasal  Swab     Status: None   Collection Time: 11/21/2022  6:47 PM   Specimen: Anterior Nasal Swab  Result Value Ref Range Status   SARS Coronavirus 2 by RT PCR NEGATIVE NEGATIVE Final    Comment: (NOTE) SARS-CoV-2 target nucleic acids are NOT DETECTED.  The SARS-CoV-2 RNA is generally detectable in upper respiratory specimens during the acute phase of infection. The lowest concentration of SARS-CoV-2 viral copies this assay can detect is 138 copies/mL. A negative result does not preclude SARS-Cov-2 infection and should not be used as the sole basis for treatment or other patient management decisions. A negative result may occur with  improper specimen collection/handling, submission of specimen other than nasopharyngeal swab, presence of viral mutation(s) within the areas targeted by this assay, and inadequate number of viral copies(<138 copies/mL). A negative result must be combined with clinical observations, patient history, and epidemiological information. The expected result is Negative.  Fact Sheet for Patients:  EntrepreneurPulse.com.au  Fact Sheet for Healthcare Providers:  IncredibleEmployment.be  This test is no t yet approved or cleared by the [place] and  has been authorized for detection and/or diagnosis of SARS-CoV-2 by FDA under an Emergency Use Authorization (EUA). This EUA will remain  in effect (meaning this test can be used) for the duration of the COVID-19 declaration under Section 564(b)(1) of the Act, 21 U.S.C.section 360bbb-3(b)(1), unless the authorization is terminated  or revoked sooner.       Influenza A by PCR NEGATIVE NEGATIVE Final   Influenza B by PCR NEGATIVE NEGATIVE Final    Comment: (NOTE) The Xpert Xpress SARS-CoV-2/FLU/RSV plus assay is intended as an aid in the diagnosis of influenza from Nasopharyngeal swab specimens and should not be used as a sole basis for treatment. Nasal washings and aspirates  are unacceptable for Xpert Xpress SARS-CoV-2/FLU/RSV testing.  Fact Sheet for Patients: EntrepreneurPulse.com.au  Fact Sheet for Healthcare Providers: IncredibleEmployment.be  This test is not yet approved or cleared by the [place] FDA and has been authorized for detection and/or diagnosis of SARS-CoV-2 by FDA under an Emergency Use Authorization (EUA). This EUA will remain in effect (meaning this test can be used) for the duration of the COVID-19 declaration under Section 564(b)(1) of the Act, 21 U.S.C. section 360bbb-3(b)(1), unless the authorization is terminated or revoked.     Resp Syncytial Virus by PCR NEGATIVE NEGATIVE Final    Comment: (NOTE) Fact Sheet for Patients: EntrepreneurPulse.com.au  Fact Sheet for Healthcare Providers: IncredibleEmployment.be  This test is not yet approved or cleared by the [place] FDA and has been authorized for detection and/or diagnosis of SARS-CoV-2 by FDA under an Emergency Use Authorization (EUA). This EUA will remain in effect (meaning this test can be used) for the duration of the COVID-19 declaration under Section 564(b)(1) of the Act, 21 U.S.C. section 360bbb-3(b)(1), unless the authorization is terminated or revoked.  Performed at [facility], [place] [address] [place], [place] [address]   Blood Culture (routine x 2)     Status: None   Collection Time: 11/20/2022  7:46 PM   Specimen: BLOOD RIGHT HAND  Result Value Ref Range Status   Specimen Description   Final    BLOOD RIGHT HAND Performed at [facility], [place] [address] [place], [place] [address]    Special Requests   Final    BOTTLES DRAWN AEROBIC AND ANAEROBIC Blood Culture adequate volume Performed at [place] [address], [place], [place] [address]    Culture   Final    NO GROWTH 5 DAYS Performed at [facility], [place]  [address], [place], [place] [address]    Report Status 11/30/2022 FINAL  Final  Blood Culture (routine x 2)     Status: None   Collection Time: 11/22/2022  7:54 PM   Specimen: BLOOD  Result Value Ref Range Status   Specimen Description   Final    BLOOD RIGHT ANTECUBITAL Performed at [place] [facility] [address], [place], [place] [address]    Special Requests   Final    BOTTLES DRAWN AEROBIC AND ANAEROBIC Blood Culture adequate volume Performed at [place] [address], [place], [place] [address]    Culture   Final    NO GROWTH 5 DAYS Performed at [facility], [place] [address], [place], [place] [address]    Report Status 11/30/2022 FINAL  Final  MRSA Next Gen by PCR, Nasal     Status: None  Collection Time: 11/26/22  8:01 PM   Specimen: Nasal Mucosa; Nasal Swab  Result Value Ref Range Status   MRSA by PCR Next Gen NOT DETECTED NOT DETECTED Final    Comment: (NOTE) The GeneXpert MRSA Assay (FDA approved for NASAL specimens only), is one component of a comprehensive MRSA colonization surveillance program. It is not intended to diagnose MRSA infection nor to guide or monitor treatment for MRSA infections. Test performance is not FDA approved in patients less than 89 years old. Performed at [facility], [place] [address], [place], [place] [address]     Radiology Studies: No results found.  Scheduled Meds:  atropine  2 drop Sublingual QID   Chlorhexidine Gluconate Cloth  6 each Topical Daily   glycopyrrolate  0.1 mg Intravenous Q4H   scopolamine  1 patch Transdermal Q72H   Continuous Infusions:  HYDROmorphone 1.5 mg/hr (12/05/22 0952)     LOS: 10 days    Time spent: 25 mins    Hyrum Shaneyfelt, MD [facility]   If 7PM-7AM, please contact night-coverage

## 2022-12-05 NOTE — Progress Notes (Signed)
Michelle [name] is hanging.  She started to have a change in her breathing.  She is having some pauses with her breathing now.  I am not surprised by this.  She looks comfortable.  Her breathing does not appear to be stressed.  She is still on the low-dose Dilaudid infusion.  I have to believe that she may not make it to the weekend.  She may make it to today.  Typically, when we start to see the change in breathing, patients typically do not make it more than 1-2 days.  Again, she is comfortable.  Some of her family are with her.  They are very pleased with how well the staff on [address] have been taking care of her and have been so attentive.  I know that their faith is very strong.  I know that Michelle [name] has always had a very strong faith.  [name], MD  2 Timothy 4:6-8

## 2022-12-05 DEATH — deceased

## 2022-12-06 ENCOUNTER — Inpatient Hospital Stay: Payer: BC Managed Care – PPO | Admitting: Hematology & Oncology

## 2022-12-06 ENCOUNTER — Inpatient Hospital Stay: Payer: BC Managed Care – PPO

## 2022-12-06 DIAGNOSIS — C9000 Multiple myeloma not having achieved remission: Secondary | ICD-10-CM | POA: Diagnosis not present

## 2022-12-13 ENCOUNTER — Inpatient Hospital Stay: Payer: BC Managed Care – PPO

## 2022-12-13 ENCOUNTER — Inpatient Hospital Stay: Payer: BC Managed Care – PPO | Admitting: Hematology & Oncology

## 2023-01-03 NOTE — Death Summary Note (Signed)
Death Summary  COLLENE MASSIMINO QQV:[id number] DOB: 02/14/1961 DOA: 2022/12/20  PCP: Leeroy Cha, MD  Admit date: 2022-12-20 Date of Death: Dec 31, 2022 Time of Death:15:00 pm Notification: Leeroy Cha, MD notified of death of 2022-12-31   History of present illness:  Michelle [name] is a 62 y.o. female with a history of multiple myeloma with leukemic transformation presented with encephalopathy and respiratory distress. Patient has downward decline. Palliative care consulted. Care transitioned to comfort measures. She was pronounced dead at 3:00 pm.  Family was at bed side and was notified.   Final Diagnoses:  1.  Multiple Myeloma with Leukemic transformation 2.  Acute metabolic encephalopathy 3.  Metabolic acidosis   The results of significant diagnostics from this hospitalization (including imaging, microbiology, ancillary and laboratory) are listed below for reference.    Significant Diagnostic Studies: CT CHEST WO CONTRAST  Result Date: 11/28/2022 CLINICAL DATA:  62 year old female with increasing right lung opacity. Complicated pneumonia suspected. History of multiple myeloma. EXAM: CT CHEST WITHOUT CONTRAST TECHNIQUE: Multidetector CT imaging of the chest was performed following the standard protocol without IV contrast. RADIATION DOSE REDUCTION: This exam was performed according to the departmental dose-optimization program which includes automated exposure control, adjustment of the mA and/or kV according to patient size and/or use of iterative reconstruction technique. COMPARISON:  Portable chest 0635 hours today. Chest CT without contrast 06/19/2022. FINDINGS: Cardiovascular: Stable cardiac size, within normal limits. No pericardial effusion. Extensive chronic calcified coronary artery atherosclerosis or stent. Left chest Port-A-Cath is chronic. Vascular patency is not evaluated in the absence of IV contrast. Mediastinum/Nodes: No definite mediastinal mass or  lymphadenopathy in the absence of IV contrast. Lungs/Pleura: Diffuse multiple myeloma, lytic lesions throughout the visible skeleton. Pathologic compression fractures of T4, T12, and L1 appears stable since August. Pathologic compression fractures of both T9 and T10 appear mildly progressed. There has been some interval healing of widespread sternal osteolysis. But manubrium lytic lesion is new/progressed. Numerous rib lesions. And there is now a large lobulated soft tissue mass arising from the chronically abnormal posterior right sixth rib (series 6, image 52) which extends at least 6 cm in the AP dimension invading both the right lung and the right chest wall. Similarly, a chronically expanded lesion of the right anterior 6th rib has substantially progressed, and now extends at least 13 cm craniocaudal (series 6, image 39) invading the right lateral costophrenic angle, and right upper abdomen as well as the chest wall there. Superimposed chronic right pleural effusion has increased since last year, with multiple new lobulated and hyperdense pleural based components which could be pleural metastatic disease (favored series 2, image 34) or hemarthrosis. Fluid tracks in the right major fissure which is new. Fluid opacifies the right lung apex. Superimposed considerable compressive atelectasis now in the right lung. No convincing superimposed pneumonia. Contralateral smaller left pleural effusion appears more stable and simple. Lesser left lung atelectasis. Upper Abdomen: Progressive right chest wall mass now exerts mild mass effect on the right liver dome as seen on series 2, image 113. Visible gallbladder is distended, with no obvious pericholecystic inflammation. Grossly negative visible noncontrast spleen, pancreas, adrenal glands, kidneys, and bowel in the upper abdomen. No free air or free fluid identified in the upper abdomen. Musculoskeletal: Discussed with the lungs above. IMPRESSION: 1. Diffuse Multiple  Myeloma, and progressive right lung opacification appears related to combined: - increasing right pleural effusion, - multiple bulky chest wall invasive masses (see #2), and - bulky Pleural Tumor versus Hemothorax. Compressive atelectasis  of the right lung.  No convincing pneumonia. 2. Large, at least 13 cm soft tissue mass arising from chronically expanded right anterior 6th rib invades the upper abdomen as well as the chest wall, now with mass effect on the liver. 3. Diffuse lytic lesions and multiple pathologic thoracic compression fractures, with T9 and T10 progressed since August. Electronically Signed   By: Genevie Ann M.D.   On: 11/28/2022 11:52   EEG adult  Result Date: 11/28/2022 Lora Havens, MD     11/28/2022  9:04 AM Patient Name: Michelle [name] MRN: [id number] Epilepsy Attending: Lora Havens Referring Physician/Provider: Edwin Dada, MD Date: 11/27/2022 Duration: 21.33 mins Patient history: 62yo F with ams. EEG to evaluate for seizure Level of alertness: Awake, asleep AEDs during EEG study: Ativan Technical aspects: This EEG study was done with scalp electrodes positioned according to the 10-20 International system of electrode placement. Electrical activity was reviewed with band pass filter of 1-'70Hz'$ , sensitivity of 7 uV/mm, display speed of 52m/sec with a '60Hz'$  notched filter applied as appropriate. EEG data were recorded continuously and digitally stored.  Video monitoring was available and reviewed as appropriate. Description: The posterior dominant rhythm consists of 7 Hz activity of moderate voltage (25-35 uV) seen predominantly in posterior head regions, symmetric and reactive to eye opening and eye closing. Sleep was characterized by vertex waves, sleep spindles (12 to 14 Hz), maximal frontocentral region. EEG showed continuous generalized 5 to 7 Hz theta slowing. Hyperventilation and photic stimulation were not performed.   ABNORMALITY - Continuous slow, generalized IMPRESSION:  This study is suggestive of moderate diffuse encephalopathy, nonspecific etiology. No seizures or epileptiform discharges were seen throughout the recording. PLora Havens  DG CHEST PORT 1 VIEW  Result Date: 11/28/2022 CLINICAL DATA:  Shortness of breath. EXAM: PORTABLE CHEST 1 VIEW COMPARISON:  November 25, 2022. FINDINGS: Stable cardiomediastinal silhouette. Left lung is clear. Significantly increased right basilar opacity is noted concerning for worsening pneumonia or atelectasis. Left internal jugular Port-A-Cath is unchanged. Bony thorax is unremarkable. IMPRESSION: Significantly increased right lung opacity is noted concerning for worsening pneumonia or atelectasis. Electronically Signed   By: JMarijo ConceptionM.D.   On: 11/28/2022 08:45   DG Humerus Left  Result Date: 11/26/2022 CLINICAL DATA:  Multiple myeloma, left humeral fracture EXAM: LEFT HUMERUS - 2+ VIEW COMPARISON:  10/17/2022 FINDINGS: Pathologic fracture of the mid-diaphysis of the left humerus again identified with a poorly circumscribed lytic lesion again identified in keeping the patient's known myeloma. Osteolysis has progressed with increasing cortical erosion in development of multiple ossific pathologic fracture fragments. Overall alignment is preserved with mild lateral angulation of the distal fracture fragment. No additional lytic or blastic bone lesions identified. No dislocation. IMPRESSION: 1. Pathologic fracture of the mid-diaphysis of the left humerus with disease progression and progressive osteolysis and development of multiple ossific pathologic fracture fragments. Electronically Signed   By: AFidela SalisburyM.D.   On: 11/26/2022 02:02   CT Head Wo Contrast  Result Date: 11/16/2022 CLINICAL DATA:  Mental status change. On chemotherapy for multiple myeloma. EXAM: CT HEAD WITHOUT CONTRAST TECHNIQUE: Contiguous axial images were obtained from the base of the skull through the vertex without intravenous contrast.  RADIATION DOSE REDUCTION: This exam was performed according to the departmental dose-optimization program which includes automated exposure control, adjustment of the mA and/or kV according to patient size and/or use of iterative reconstruction technique. COMPARISON:  CT head 11/22/2022.  MRI orbits 09/19/2022 FINDINGS: Brain:  No evidence of acute infarction, hemorrhage, hydrocephalus, extra-axial collection or mass lesion/mass effect. Vascular: No hyperdense vessel or unexpected calcification. Skull: Multiple lytic lesions are again seen including prominent lesion within the clivus, unchanged from prior. No acute fractures are seen. Sinuses/Orbits: No acute finding. Other: None. IMPRESSION: No acute intracranial abnormality. Stable osteolytic lesions compatible with patient's history of myeloma. Electronically Signed   By: Ronney Asters M.D.   On: 12/01/2022 23:03   CT Cervical Spine Wo Contrast  Result Date: 11/21/2022 CLINICAL DATA:  Trauma multiple myeloma EXAM: CT CERVICAL SPINE WITHOUT CONTRAST TECHNIQUE: Multidetector CT imaging of the cervical spine was performed without intravenous contrast. Multiplanar CT image reconstructions were also generated. RADIATION DOSE REDUCTION: This exam was performed according to the departmental dose-optimization program which includes automated exposure control, adjustment of the mA and/or kV according to patient size and/or use of iterative reconstruction technique. COMPARISON:  MRI 09/12/2022 FINDINGS: Alignment: No subluxation.  Facet alignment is within normal limits. Skull base and vertebrae: No acute fracture. Multiple lytic lesions within the vertebra and left skull base consistent with history of multiple myeloma. Soft tissues and spinal canal: No prevertebral fluid or swelling. No visible canal hematoma. Disc levels:  Moderate disc space narrowing C5-C6 and C6-C7. Upper chest: Partially visualized right pleural effusion. Suspected right supraclavicular region  nodes or masses measuring up to 2.5 cm. Other: None IMPRESSION: 1. No acute fracture or malalignment of the cervical spine. 2. Multiple lytic lesions within the vertebra and left skull base consistent with history of multiple myeloma. 3. Partially visualized right pleural effusion. Suspected right supraclavicular region nodes or masses measuring up to 2.5 cm. Electronically Signed   By: Donavan Foil M.D.   On: 11/13/2022 23:03   DG Chest Portable 1 View  Result Date: 11/12/2022 CLINICAL DATA:  Altered mental status EXAM: PORTABLE CHEST 1 VIEW COMPARISON:  Chest x-ray dated October 17, 2022 FINDINGS: Cardiac and mediastinal contours are unchanged. Elevation of the right hemidiaphragm. increased heterogeneous opacities of the right hemithorax. Right chest wall masses, are similar to prior exam. IMPRESSION: Increased heterogeneous opacities of the right hemithorax, possibly due to atelectasis, asymmetric pulmonary edema or infection. Electronically Signed   By: Yetta Glassman M.D.   On: 11/13/2022 20:14   DG Pelvis Portable  Result Date: 11/15/2022 CLINICAL DATA:  Altered mental status EXAM: PORTABLE PELVIS 1-2 VIEWS COMPARISON:  10/22/2022, CT 07/18/2022 FINDINGS: SI joints show mild degenerative change. Pubic symphysis appears intact. Heterogeneous lytic lesion at the lesser trochanter of left femur. No dislocation. Additional lytic lesions are faintly visible, corresponding to the lytic lesions on CT. IMPRESSION: Heterogeneous lytic lesion at the lesser trochanter of the left with pathologic fracture as before. Additional lytic lesions seen on prior CT are also faintly visible. Electronically Signed   By: Donavan Foil M.D.   On: 11/22/2022 20:10   CT HEAD WO CONTRAST (5MM)  Result Date: 11/22/2022 CLINICAL DATA:  Hematologic malignancy, monitor Myeloma. Has occasional confusion. EXAM: CT HEAD WITHOUT CONTRAST TECHNIQUE: Contiguous axial images were obtained from the base of the skull through the  vertex without intravenous contrast. RADIATION DOSE REDUCTION: This exam was performed according to the departmental dose-optimization program which includes automated exposure control, adjustment of the mA and/or kV according to patient size and/or use of iterative reconstruction technique. COMPARISON:  None Available. FINDINGS: Brain: There is periventricular white matter decreased attenuation consistent with small vessel ischemic changes. Ventricles, sulci and cisterns are prominent consistent with age related involutional changes. No acute intracranial  hemorrhage, mass effect or shift. No hydrocephalus. Vascular: No hyperdense vessel or unexpected calcification. Skull: Osteolytic changes involving the clivus and calvarium consistent with patient's history of myeloma with numerous osteolytic lesions. Sinuses/Orbits: No acute finding. IMPRESSION: Atrophy and chronic small vessel ischemic changes. Osteolytic lesions in the calvarium and clivus consistent with known history of myeloma. Electronically Signed   By: Sammie Bench M.D.   On: 11/22/2022 16:38    Microbiology: Recent Results (from the past 240 hour(s))  MRSA Next Gen by PCR, Nasal     Status: None   Collection Time: 11/26/22  8:01 PM   Specimen: Nasal Mucosa; Nasal Swab  Result Value Ref Range Status   MRSA by PCR Next Gen NOT DETECTED NOT DETECTED Final    Comment: (NOTE) The GeneXpert MRSA Assay (FDA approved for NASAL specimens only), is one component of a comprehensive MRSA colonization surveillance program. It is not intended to diagnose MRSA infection nor to guide or monitor treatment for MRSA infections. Test performance is not FDA approved in patients less than 107 years old. Performed at [facility], [place] [address] [place], [place] [address]      Labs: Basic Metabolic Panel: Recent Labs  Lab 11/30/22 0415  NA 147*  K 3.3*  CL 121*  CO2 17*  GLUCOSE 86  BUN 20  CREATININE 2.69*  CALCIUM 6.1*    Liver Function Tests: No results for input(s): "AST", "ALT", "ALKPHOS", "BILITOT", "PROT", "ALBUMIN" in the last 168 hours. No results for input(s): "LIPASE", "AMYLASE" in the last 168 hours. No results for input(s): "AMMONIA" in the last 168 hours. CBC: Recent Labs  Lab 11/30/22 0415  WBC 4.1  HGB 9.1*  HCT 28.8*  MCV 90.3  PLT 200   Cardiac Enzymes: No results for input(s): "CKTOTAL", "CKMB", "CKMBINDEX", "TROPONINI" in the last 168 hours. D-Dimer No results for input(s): "DDIMER" in the last 72 hours. BNP: Invalid input(s): "POCBNP" CBG: Recent Labs  Lab 11/30/22 0203 11/30/22 0250 11/30/22 0549 11/30/22 0758 11/30/22 1153  GLUCAP 65* 106* 74 78 76   Anemia work up No results for input(s): "VITAMINB12", "FOLATE", "FERRITIN", "TIBC", "IRON", "RETICCTPCT" in the last 72 hours. Urinalysis    Component Value Date/Time   COLORURINE YELLOW 11/05/2022 1841   APPEARANCEUR CLOUDY (A) 11/09/2022 1841   LABSPEC 1.011 11/05/2022 1841   PHURINE 6.0 11/16/2022 1841   GLUCOSEU NEGATIVE 11/26/2022 1841   HGBUR SMALL (A) 11/10/2022 1841   BILIRUBINUR NEGATIVE 11/04/2022 1841   KETONESUR NEGATIVE 11/21/2022 1841   PROTEINUR 100 (A) 11/17/2022 1841   UROBILINOGEN 0.2 05/01/2010 0830   NITRITE NEGATIVE 11/24/2022 1841   LEUKOCYTESUR LARGE (A) 11/24/2022 1841   Sepsis Labs Recent Labs  Lab 11/30/22 0415  WBC 4.1   SIGNED:  Shawna Clamp, MD  [facility] 2022-12-29, 4:08 PM Pager   If 7PM-7AM, please contact night-coverage

## 2023-01-03 NOTE — Progress Notes (Signed)
PROGRESS NOTE    Michelle [name]  HKV:[id number] DOB: [birth year] DOA: 12/03/2022 PCP: Leeroy Cha, MD    Brief Narrative:  This 62 years old female with a history of multiple myeloma presented with encephalopathy and respiratory distress.  Patient has downward decline.  Palliative care consulted.  Care transitioned to comfort measures and expect an in hospital bed.  Assessment & Plan:   Principal Problem:   Possible sepsis with end organ damage Active Problems:   Acute metabolic encephalopathy   Multiple myeloma without remission (HCC)   Left humeral fracture   Acute renal failure superimposed on stage 3b chronic kidney disease (HCC)   Hyperlipidemia   Essential hypertension   Hypothyroidism   Metabolic acidosis   Tachycardia   Anemia of chronic renal failure, stage 3b (HCC)   Malignant pleural effusion   Anxiety   Dying care   Fall at home, initial encounter   Failure to thrive in adult   Hypocalcemia   Thrombocytopenia (HCC)   Hypophosphatemia   Hypernatremia   Malnutrition of moderate degree   Altered mental status   DNR (do not resuscitate)   Need for emotional support   Dyspnea   High risk medication use   Terminal respiratory secretions   End of life care   Palliative care encounter   Multiple myeloma with leukemic transformation Acute metabolic encephalopathy due to leukemia: Patient has poor prognosis,. Care transitioned to full comfort care. Continue Dilaudid infusion.  Continue bolus IV Dilaudid for pain control and agitation. Continue Foley catheter, Continue scopolamine patch Continue with scheduled Robinul and atropine.  DVT prophylaxis: None Code Status: DNR/ Comfort care. Family Communication: Family at bed side. Disposition Plan:   Anticipate hospital death.   Consultants:  Palliative care  Procedures: None  Antimicrobials: None  Subjective: Patient remains unresponsive, respiration more irregular and fast but remains  comfortable. Care transitioned to full comfort care.  Family at bedside.  Objective: Vitals:   11/30/22 1337 11/30/22 1400 11/30/22 1715 12/02/22 2100  BP:  (!) 140/95 (!) 153/91 123/74  Pulse: (!) 128 (!) 132 (!) 122 (!) 153  Resp: 14 (!) '9 12 15  '$ Temp: 98 F (36.7 C)     TempSrc: Axillary     SpO2: 100% 100% 100% 96%  Weight:      Height:        Intake/Output Summary (Last 24 hours) at 01-Jan-2023 1122 Last data filed at 01-Jan-2023 1000 Gross per 24 hour  Intake 40.12 ml  Output 250 ml  Net -209.88 ml   Filed Weights   11/28/22 0440 11/29/22 0511 11/30/22 0500  Weight: 73 kg 74.1 kg 73.8 kg    Examination:  General exam: Appears chronically ill looking, but comfortable. Respiratory system: Clear to auscultation. Respiratory effort normal. RR 13 Cardiovascular system: S1 & S2 heard, regular rate and rhythm, no murmur. Gastrointestinal system: Abdomen is soft. Non tender, no distended, BS+ Central nervous system: Non verbal . Not assessed,  Extremities: No cyanosis, no clubbing Skin: No rashes, lesions or ulcers Psychiatry:Not assessed.     Data Reviewed: I have personally reviewed following labs and imaging studies  CBC: Recent Labs  Lab 11/30/22 0415  WBC 4.1  HGB 9.1*  HCT 28.8*  MCV 90.3  PLT 564   Basic Metabolic Panel: Recent Labs  Lab 11/30/22 0415  NA 147*  K 3.3*  CL 121*  CO2 17*  GLUCOSE 86  BUN 20  CREATININE 2.69*  CALCIUM 6.1*   GFR: Estimated Creatinine Clearance:  20.8 mL/min (A) (by C-G formula based on SCr of 2.69 mg/dL (H)). Liver Function Tests: No results for input(s): "AST", "ALT", "ALKPHOS", "BILITOT", "PROT", "ALBUMIN" in the last 168 hours.  No results for input(s): "LIPASE", "AMYLASE" in the last 168 hours. No results for input(s): "AMMONIA" in the last 168 hours. Coagulation Profile: No results for input(s): "INR", "PROTIME" in the last 168 hours. Cardiac Enzymes: No results for input(s): "CKTOTAL", "CKMB", "CKMBINDEX",  "TROPONINI" in the last 168 hours. BNP (last 3 results) No results for input(s): "PROBNP" in the last 8760 hours. HbA1C: No results for input(s): "HGBA1C" in the last 72 hours. CBG: Recent Labs  Lab 11/30/22 0203 11/30/22 0250 11/30/22 0549 11/30/22 0758 11/30/22 1153  GLUCAP 65* 106* 74 78 76   Lipid Profile: No results for input(s): "CHOL", "HDL", "LDLCALC", "TRIG", "CHOLHDL", "LDLDIRECT" in the last 72 hours. Thyroid Function Tests: No results for input(s): "TSH", "T4TOTAL", "FREET4", "T3FREE", "THYROIDAB" in the last 72 hours. Anemia Panel: No results for input(s): "VITAMINB12", "FOLATE", "FERRITIN", "TIBC", "IRON", "RETICCTPCT" in the last 72 hours. Sepsis Labs: No results for input(s): "PROCALCITON", "LATICACIDVEN" in the last 168 hours.  Recent Results (from the past 240 hour(s))  MRSA Next Gen by PCR, Nasal     Status: None   Collection Time: 11/26/22  8:01 PM   Specimen: Nasal Mucosa; Nasal Swab  Result Value Ref Range Status   MRSA by PCR Next Gen NOT DETECTED NOT DETECTED Final    Comment: (NOTE) The GeneXpert MRSA Assay (FDA approved for NASAL specimens only), is one component of a comprehensive MRSA colonization surveillance program. It is not intended to diagnose MRSA infection nor to guide or monitor treatment for MRSA infections. Test performance is not FDA approved in patients less than 87 years old. Performed at [facility], [place] [address], [place], [place] [address]     Radiology Studies: No results found.  Scheduled Meds:  atropine  2 drop Sublingual QID   Chlorhexidine Gluconate Cloth  6 each Topical Daily   glycopyrrolate  0.1 mg Intravenous Q4H   scopolamine  1 patch Transdermal Q72H   Continuous Infusions:  HYDROmorphone 1.5 mg/hr (12/05/22 1811)     LOS: 11 days    Time spent: 25 mins    Zehava Turski, MD [facility]   If 7PM-7AM, please contact night-coverage

## 2023-01-03 NOTE — Progress Notes (Signed)
  Daily Progress Note   Patient Name: Michelle [name]       Date: 01-05-23 DOB: [birth year]  Age: 62 y.o. MRN#: [id number] Attending Physician: Shawna Clamp, MD Primary Care Physician: Leeroy Cha, MD Admit Date: 11/30/2022 Length of Stay: 11 days  PMT has been continuing to follow along with patient's care to assist with symptom management at the end of life. While coordinating care with bedside RN in afternoon, informed that patient has now passed away. Appreciate everyone's wonderful care for Mrs. Busk at the end of her life.   Chelsea Aus, DO Palliative Care Provider PMT # [phone]

## 2023-01-03 NOTE — Progress Notes (Signed)
Post mortem care has been performed on the patient.

## 2023-01-03 NOTE — Plan of Care (Signed)
  Problem: Pain Managment: Goal: General experience of comfort will improve Outcome: Progressing   

## 2023-01-03 DEATH — deceased

## 2023-09-21 IMAGING — CR DG THORACIC SPINE 3V
3 series · 3 of 3 positions shown · non-contrast
Comparison: Two views chest 05/01/2010.

CLINICAL DATA: Chronic midline thoracic pain below scapulae
wrapping around right side.

EXAM:
THORACIC SPINE - 3 VIEWS

[w t-spine a.p. *]
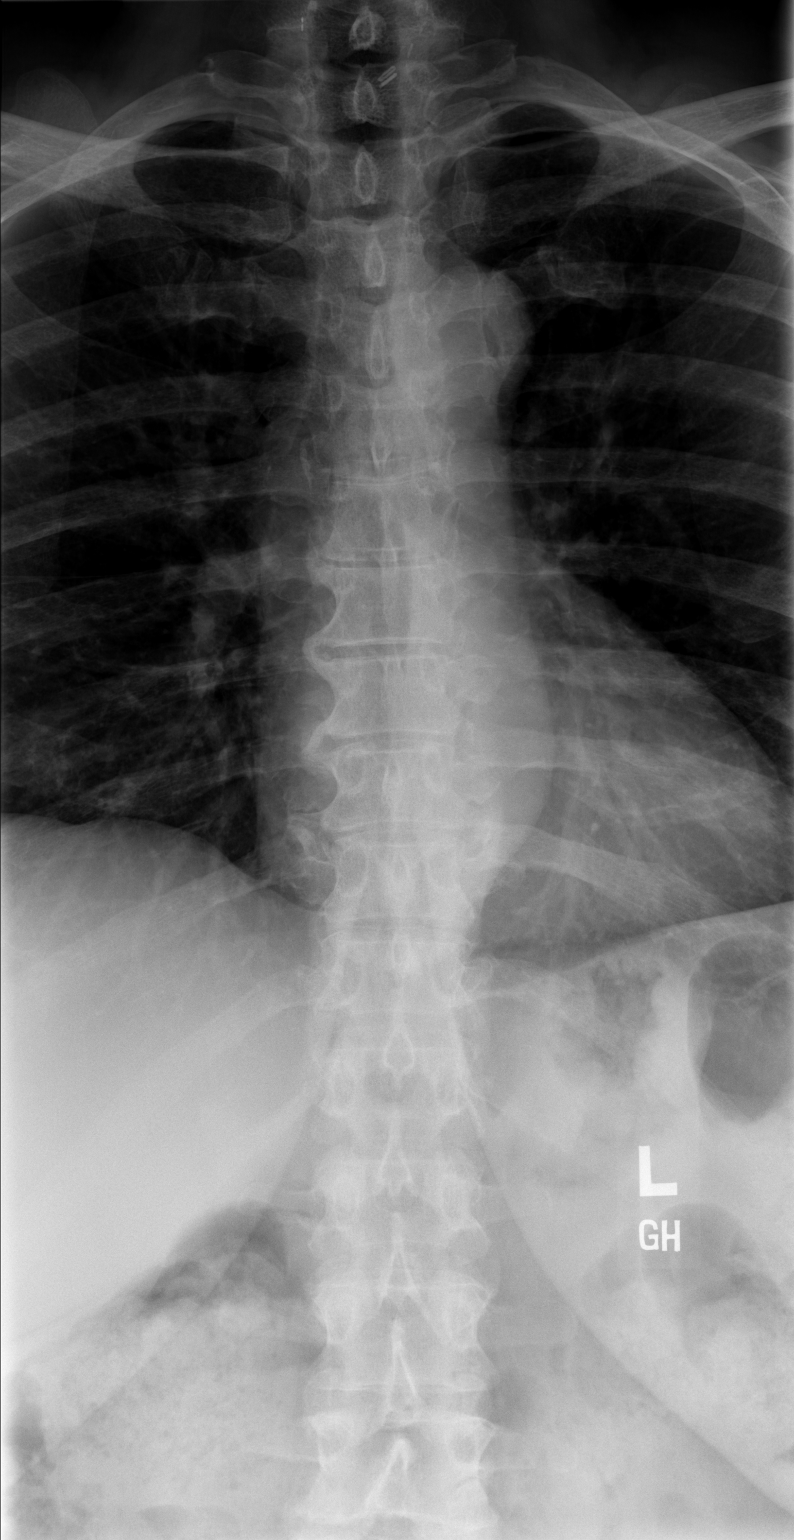

[w t-spine lat]
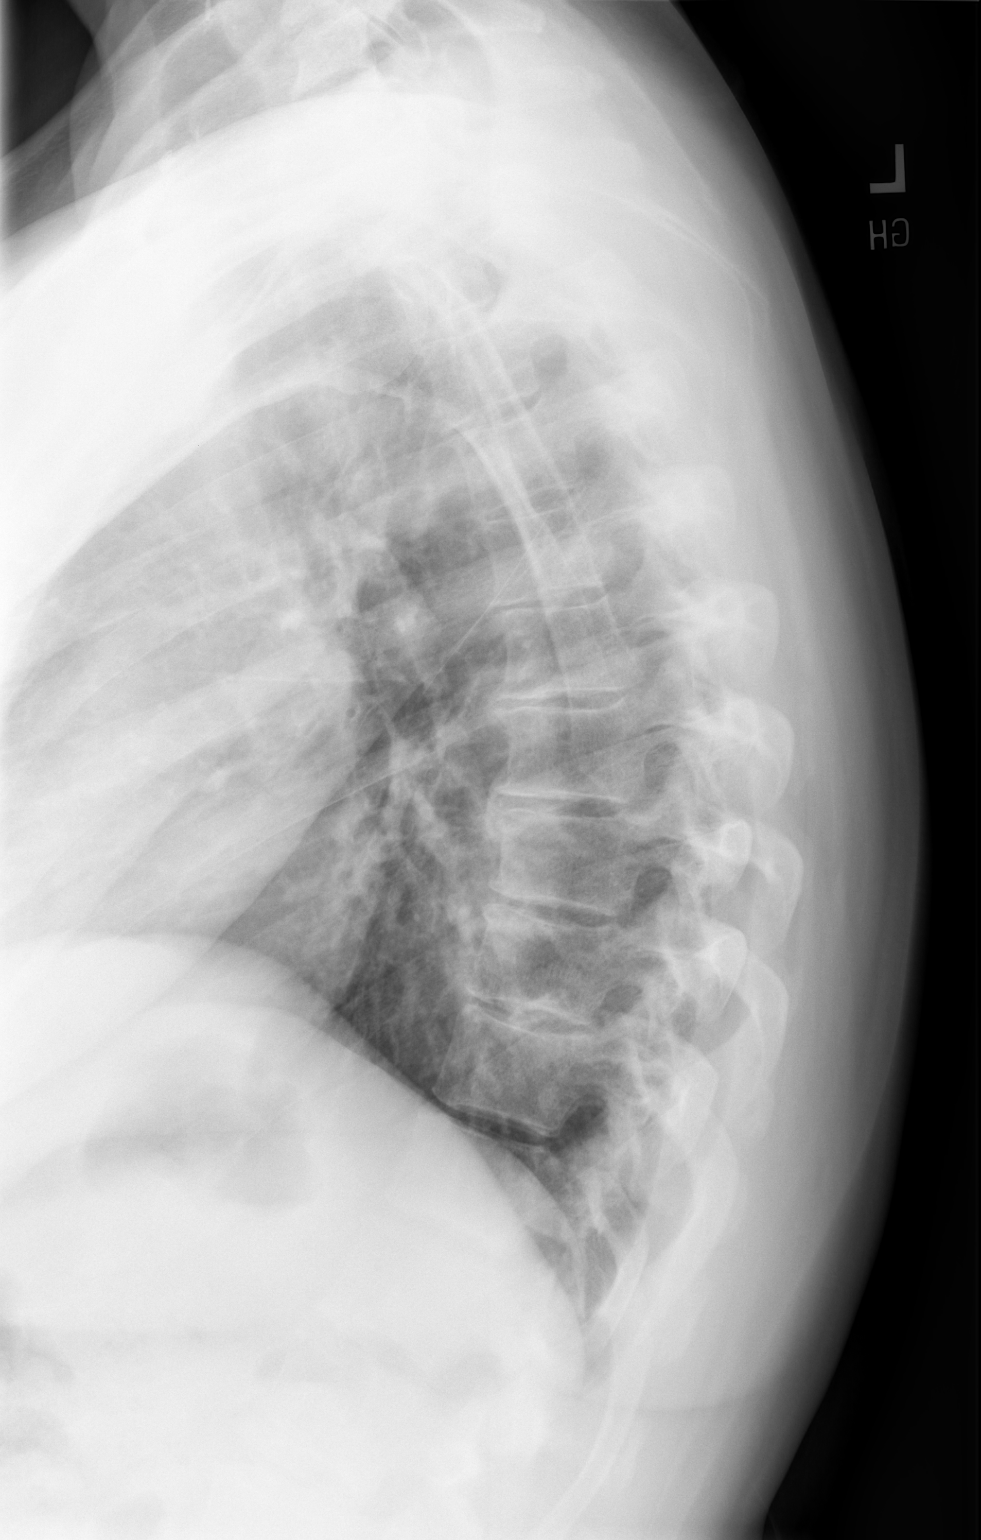

[w swimmers view]
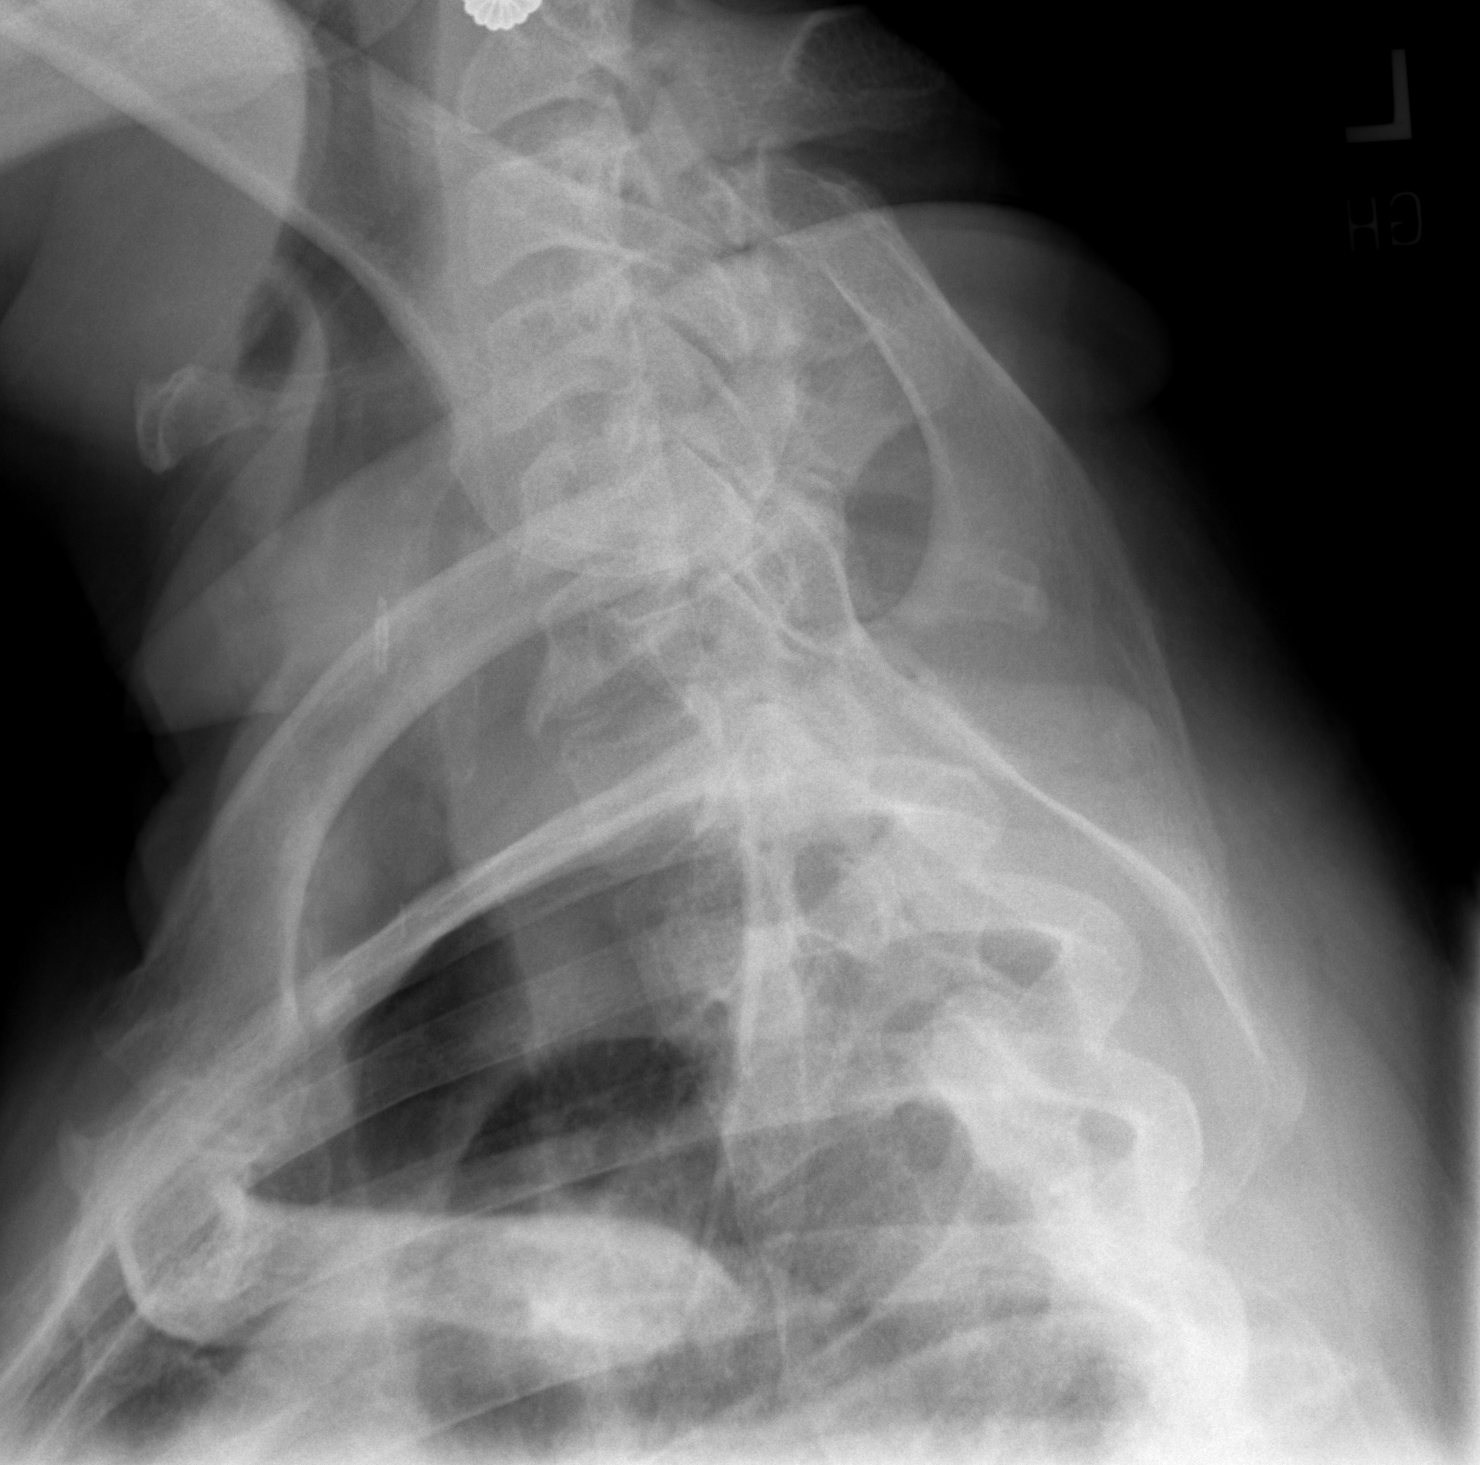

[3 of 3 positions shown; findings below may reference images not displayed]

FINDINGS: There is no evidence of thoracic spine fracture. Alignment is
normal. Soft tissues are within normal limits. There are minimal
degenerative endplate osteophytes in the midthoracic spine.
IMPRESSION: 1. No evidence for fracture or malalignment.
2. Minimal degenerative changes of the midthoracic spine.
